# Patient Record
Sex: Female | Born: 1960 | Race: Black or African American | Hispanic: No | Marital: Single | Smoking: Former smoker
Health system: Southern US, Community
[De-identification: ages and names within clinical notes are randomized; demographics above are authoritative.]

## PROBLEM LIST (undated history)

## (undated) DIAGNOSIS — R0600 Dyspnea, unspecified: Secondary | ICD-10-CM

## (undated) DIAGNOSIS — I10 Essential (primary) hypertension: Secondary | ICD-10-CM

## (undated) DIAGNOSIS — Z972 Presence of dental prosthetic device (complete) (partial): Secondary | ICD-10-CM

## (undated) DIAGNOSIS — R7303 Prediabetes: Secondary | ICD-10-CM

## (undated) DIAGNOSIS — M1990 Unspecified osteoarthritis, unspecified site: Secondary | ICD-10-CM

## (undated) DIAGNOSIS — E785 Hyperlipidemia, unspecified: Secondary | ICD-10-CM

## (undated) HISTORY — DX: Hyperlipidemia, unspecified: E78.5

## (undated) HISTORY — DX: Essential (primary) hypertension: I10

## (undated) HISTORY — PX: TUBAL LIGATION: SHX77

---

## 2014-10-11 LAB — HM COLONOSCOPY

## 2018-02-15 ENCOUNTER — Ambulatory Visit (INDEPENDENT_AMBULATORY_CARE_PROVIDER_SITE_OTHER): Payer: Medicaid Other | Admitting: Family Medicine

## 2018-02-15 ENCOUNTER — Other Ambulatory Visit: Payer: Self-pay

## 2018-02-15 ENCOUNTER — Encounter: Payer: Self-pay | Admitting: Family Medicine

## 2018-02-15 VITALS — BP 160/88 | HR 77 | Temp 97.9°F | Wt 205.2 lb

## 2018-02-15 DIAGNOSIS — I10 Essential (primary) hypertension: Secondary | ICD-10-CM | POA: Diagnosis not present

## 2018-02-15 DIAGNOSIS — S83511A Sprain of anterior cruciate ligament of right knee, initial encounter: Secondary | ICD-10-CM | POA: Diagnosis present

## 2018-02-15 DIAGNOSIS — S83411A Sprain of medial collateral ligament of right knee, initial encounter: Secondary | ICD-10-CM | POA: Diagnosis not present

## 2018-02-15 HISTORY — DX: Sprain of anterior cruciate ligament of right knee, initial encounter: S83.511A

## 2018-02-15 HISTORY — DX: Sprain of medial collateral ligament of right knee, initial encounter: S83.411A

## 2018-02-15 MED ORDER — DICLOFENAC SODIUM 50 MG PO TBEC: 50.0000 mg | DELAYED_RELEASE_TABLET | Freq: Three times a day (TID) | ORAL | 0 refills | Status: DC

## 2018-02-15 NOTE — Patient Instructions (Addendum)
It was great seeing you today! I believe that you have an ACL tear based on your MRI that you brought in. I will refer you to orthopedic surgery for further management. I have changed your meloxicam to diclofenac which I believe will provide short term relief to your pain.

## 2018-02-15 NOTE — Progress Notes (Signed)
  HPI:  Patient presents today for a new patient appointment to establish general primary care, also to discuss right knee and back pain.  Knee pain started in September 2018. It has gradually gotten worse. It is to the point to where she now has to walk with a cane. It has not gotten any better with long-term meloxicam use.  ROS: See HPI  Past Medical Hx:  - hypertension - hyperlipidemia - right knee pain - diabetes  Past Surgical Hx:  -none  Family Hx: updated in Epic  Social Hx: lives at home with daughter and granddaughter, [place]  Health Maintenance:  - appears to need a1c, colonoscopy, tdap, flu, pneumovax  PHYSICAL EXAM: BP (!) 160/88   Pulse 77   Temp 97.9 F (36.6 C) (Oral)   Wt 93.1 kg (205 lb 3.2 oz)   SpO2 98%  Gen: well-appearing African American female. Obese, no acute distress. HEENT: TM normal bilaterally, normal appearing nasal mucosa, eomi, perrla. Heart: regular rate and rhythm, palpable peripheral pulses, no m/r/g Lungs: lungs clear to ausculation bilaterally, symmetric chest rise bilaterally Abdomen: soft, non-tender, non-distended. Neuro: CN 2-12 intact, 5/5 strength all muscle groups in all extremities Extremities: right knee tender to palpation, no anterior drawer sign elicited  ASSESSMENT/PLAN:  # Health maintenance:  - appears to need a1c, colonoscopy, tdap, flu, pneumovax  Rupture of anterior cruciate ligament of right knee Patient with 6 month history of increasing knee pain. Had MRI performed at former residence which is consistent with ACL tear, MCL sprain. Oddly doesn't remember any specific instance of trauma. 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 Will switch from meloxicam to dilofenac in the hopes for pain control. Ultimately will need evaluation and likely surgical repair by orthopedic surgery. Referral placed. - referral to orthopedics -switch from  meloxicam to orthopedics - records from outside PCP scanned in  Sprain of medial collateral ligament of right knee Same as above plan with ACL. Likely can rehab and will not need surgical fixture.  Essential hypertension Initially in 180s/90s. On recheck 20 minutes later was 160/80. Will ask to come back in three weeks and will recheck. Likely can increase lisinopril/hctz. Given that she is AA can also consider adding CCB.     FOLLOW UP: Follow up in 3 weeks for hypertension follow up, monitor pain, ensure orthopedics appointment set up.  Myrene BuddyJacob Yana Schorr MD PGY-1 Family Medicine Resident

## 2018-02-15 NOTE — Assessment & Plan Note (Signed)
Same as above plan with ACL. Likely can rehab and will not need surgical fixture.

## 2018-02-15 NOTE — Assessment & Plan Note (Signed)
Initially in 180s/90s. On recheck 20 minutes later was 160/80. Will ask to come back in three weeks and will recheck. Likely can increase lisinopril/hctz. Given that she is AA can also consider adding CCB.

## 2018-02-15 NOTE — Assessment & Plan Note (Signed)
Patient with 6 month history of increasing knee pain. Had MRI performed at former residence which is consistent with ACL tear, MCL sprain. Oddly doesn't remember any specific instance of trauma. 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 Will switch from meloxicam to dilofenac in the hopes for pain control. Ultimately will need evaluation and likely surgical repair by orthopedic surgery. Referral placed. - referral to orthopedics -switch from meloxicam to orthopedics - records from outside PCP scanned in

## 2018-02-22 ENCOUNTER — Ambulatory Visit (INDEPENDENT_AMBULATORY_CARE_PROVIDER_SITE_OTHER): Payer: Medicaid Other | Admitting: Orthopedic Surgery

## 2018-02-22 ENCOUNTER — Encounter (INDEPENDENT_AMBULATORY_CARE_PROVIDER_SITE_OTHER): Payer: Self-pay | Admitting: Orthopedic Surgery

## 2018-02-22 VITALS — Wt 205.0 lb

## 2018-02-22 DIAGNOSIS — M1711 Unilateral primary osteoarthritis, right knee: Secondary | ICD-10-CM | POA: Diagnosis not present

## 2018-02-22 MED ORDER — METHYLPREDNISOLONE ACETATE 40 MG/ML IJ SUSP
40.0000 mg | INTRAMUSCULAR | Status: AC | PRN
  Administered 2018-02-22: 40 mg via INTRA_ARTICULAR

## 2018-02-22 MED ORDER — LIDOCAINE HCL 1 % IJ SOLN
5.0000 mL | INTRAMUSCULAR | Status: AC | PRN
  Administered 2018-02-22: 5 mL

## 2018-02-22 NOTE — Progress Notes (Signed)
Office Visit Note   Patient: Sandra Lozano           Date of Birth: 1961/10/19           MRN: [id number] Visit Date: 02/22/2018              Requested by: Myrene Buddy, MD [address] [place], [place] [address] PCP: Myrene Buddy, MD  Chief Complaint  Patient presents with  . Right Knee - Pain      HPI: Patient is a 57 year old woman who complains of right knee pain.  Denies any mechanical catching locking or giving way.  Patient states that over 5 years ago she fell directly on her knees several times.  Patient denies any rotational or varus or valgus stress injuries to her knee.  Assessment & Plan: Visit Diagnoses:  1. Unilateral primary osteoarthritis, right knee     Plan: Right knee was injected from the anterior medial portal she tolerated this well increase her activities as tolerated reevaluate in 4 weeks.  Follow-Up Instructions: Return in about 4 weeks (around 03/22/2018).   Ortho Exam  Patient is alert, oriented, no adenopathy, well-dressed, normal affect, normal respiratory effort. Has difficulty getting from sitting from a sitting to standing position patient has a slight antalgic gait.  She does have an effusion of the right knee varus and valgus stress is nontender the medial and lateral collateral ligaments are nontender to palpation the medial lateral joint line is nontender to palpation the anterior drawer is stable.  She does have a effusion.  Review of the MRI scan is suggestive of a meniscal tear sprain of the MCL as well as ACL injury.  Patient has no clinical signs or symptoms of these injuries.  Radiographs do show medial joint line narrowing which is consistent with her arthritic symptoms.  Imaging: No results found. No images are attached to the encounter.  Labs: No results found for: HGBA1C, ESRSEDRATE, CRP, LABURIC, REPTSTATUS, GRAMSTAIN, CULT, LABORGA  @LABSALLVALUES (HGBA1)@  There is no height or weight on file to calculate  BMI.  Orders:  No orders of the defined types were placed in this encounter.  No orders of the defined types were placed in this encounter.    Procedures: Large Joint Inj: R knee on 02/22/2018 8:48 AM Indications: pain and diagnostic evaluation Details: 22 G 1.5 in needle, anteromedial approach  Arthrogram: No  Medications: 5 mL lidocaine 1 %; 40 mg methylPREDNISolone acetate 40 MG/ML Outcome: tolerated well, no immediate complications Procedure, treatment alternatives, risks and benefits explained, specific risks discussed. Consent was given by the patient. Immediately prior to procedure a time out was called to verify the correct patient, procedure, equipment, support staff and site/side marked as required. Patient was prepped and draped in the usual sterile fashion.      Clinical Data: No additional findings.  ROS:  All other systems negative, except as noted in the HPI. Review of Systems  Objective: Vital Signs: Wt 205 lb (93 kg)   Specialty Comments:  No specialty comments available.  PMFS History: Patient Active Problem List   Diagnosis Date Noted  . Rupture of anterior cruciate ligament of right knee 02/15/2018  . Sprain of medial collateral ligament of right knee 02/15/2018  . Essential hypertension 02/15/2018   Past Medical History:  Diagnosis Date  . Hyperlipidemia   . Hypertension     History reviewed. No pertinent family history.  History reviewed. No pertinent surgical history. Social History   Occupational History  .  Not on file  Tobacco Use  . Smoking status: Former Smoker    Last attempt to quit: 01/2015    Years since quitting: 3.1  . Smokeless tobacco: Never Used  Substance and Sexual Activity  . Alcohol use: Not on file  . Drug use: Not on file  . Sexual activity: Not on file

## 2018-03-05 ENCOUNTER — Ambulatory Visit: Payer: Medicaid Other | Admitting: Family Medicine

## 2018-03-17 ENCOUNTER — Encounter: Payer: Self-pay | Admitting: Family Medicine

## 2018-03-18 ENCOUNTER — Other Ambulatory Visit: Payer: Self-pay | Admitting: Family Medicine

## 2018-03-18 MED ORDER — DICLOFENAC SODIUM 50 MG PO TBEC: 50.0000 mg | DELAYED_RELEASE_TABLET | Freq: Three times a day (TID) | ORAL | 0 refills | Status: DC

## 2018-03-18 NOTE — Telephone Encounter (Signed)
Pt is calling and needs a refill on her Voltaren [facility] [place] jw

## 2018-03-29 ENCOUNTER — Ambulatory Visit (INDEPENDENT_AMBULATORY_CARE_PROVIDER_SITE_OTHER): Payer: Medicaid Other | Admitting: Orthopedic Surgery

## 2018-03-29 ENCOUNTER — Encounter (INDEPENDENT_AMBULATORY_CARE_PROVIDER_SITE_OTHER): Payer: Self-pay | Admitting: Orthopedic Surgery

## 2018-03-29 VITALS — Wt 205.0 lb

## 2018-03-29 DIAGNOSIS — M1711 Unilateral primary osteoarthritis, right knee: Secondary | ICD-10-CM

## 2018-03-29 MED ORDER — DICLOFENAC SODIUM 75 MG PO TBEC: 75.0000 mg | DELAYED_RELEASE_TABLET | Freq: Two times a day (BID) | ORAL | 3 refills | Status: DC | PRN

## 2018-03-29 NOTE — Progress Notes (Signed)
   Office Visit Note   Patient: Sandra RiggsConnie Cutsforth           Date of Birth: 03/20/1961           MRN: [id number] Visit Date: 03/29/2018              Requested by: Myrene BuddyFletcher, Jacob, MD [phone] [place], [place] [id number] PCP: Myrene BuddyFletcher, Jacob, MD  Chief Complaint  Patient presents with  . Right Knee - Follow-up    S/p cortisone injection 02/22/18      HPI: Patient is a 57 year old woman with osteoarthritis of her right knee she had good interval relief of the injection for the right knee she states she is going to And is having increasing pain at this time.  Assessment & Plan: Visit Diagnoses:  1. Unilateral primary osteoarthritis, right knee     Plan: Right knee was injected discussed that we could proceed with ring injection this year.  She will follow-up as needed.  Discussed the possibility of a total knee arthroplasty several years if she still has persistent symptoms.  Follow-Up Instructions: Return if symptoms worsen or fail to improve.   Ortho Exam  Patient is alert, oriented, no adenopathy, well-dressed, normal affect, normal respiratory effort. Examination patient has an antalgic gait she is crepitation range of motion right knee collaterals and cruciates are stable there is no redness no cellulitis she has a moderate effusion of the knee.  Is tender to palpation of the medial and lateral joint line.  She has no mechanical symptoms.  Imaging: No results found. No images are attached to the encounter.  Labs: No results found for: HGBA1C, ESRSEDRATE, CRP, LABURIC, REPTSTATUS, GRAMSTAIN, CULT, LABORGA  @LABSALLVALUES (HGBA1)@  There is no height or weight on file to calculate BMI.  Orders:  No orders of the defined types were placed in this encounter.  No orders of the defined types were placed in this encounter.    Procedures: No procedures performed  Clinical Data: No additional findings.  ROS:  All other systems negative, except as noted in the  HPI. Review of Systems  Objective: Vital Signs: Wt 205 lb (93 kg)   Specialty Comments:  No specialty comments available.  PMFS History: Patient Active Problem List   Diagnosis Date Noted  . Rupture of anterior cruciate ligament of right knee 02/15/2018  . Sprain of medial collateral ligament of right knee 02/15/2018  . Essential hypertension 02/15/2018   Past Medical History:  Diagnosis Date  . Hyperlipidemia   . Hypertension     History reviewed. No pertinent family history.  History reviewed. No pertinent surgical history. Social History   Occupational History  . Not on file  Tobacco Use  . Smoking status: Former Smoker    Last attempt to quit: 01/2015    Years since quitting: 3.2  . Smokeless tobacco: Never Used  Substance and Sexual Activity  . Alcohol use: Not on file  . Drug use: Not on file  . Sexual activity: Not on file

## 2018-03-30 ENCOUNTER — Telehealth (INDEPENDENT_AMBULATORY_CARE_PROVIDER_SITE_OTHER): Payer: Self-pay

## 2018-03-30 NOTE — Telephone Encounter (Signed)
Called and lm on vm for pharm to advise that the rx for diclofenac was approved through the [facility] portal for 75mg  bid and they should be abel to run rx without problems. To call with questions. Approval # [id number]

## 2018-04-01 ENCOUNTER — Ambulatory Visit: Payer: Medicaid Other | Admitting: Family Medicine

## 2018-04-12 ENCOUNTER — Other Ambulatory Visit: Payer: Self-pay

## 2018-04-12 MED ORDER — METFORMIN HCL 500 MG PO TABS: 500.0000 mg | ORAL_TABLET | Freq: Every day | ORAL | 2 refills | Status: DC

## 2018-04-12 MED ORDER — DICLOFENAC SODIUM 75 MG PO TBEC: 75.0000 mg | DELAYED_RELEASE_TABLET | Freq: Two times a day (BID) | ORAL | 3 refills | Status: AC | PRN

## 2018-04-12 MED ORDER — LISINOPRIL-HYDROCHLOROTHIAZIDE 10-12.5 MG PO TABS: 1.0000 | ORAL_TABLET | Freq: Every day | ORAL | 2 refills | Status: DC

## 2018-04-12 MED ORDER — ATORVASTATIN CALCIUM 20 MG PO TABS: 20.0000 mg | ORAL_TABLET | Freq: Every day | ORAL | 2 refills | Status: DC

## 2018-04-12 NOTE — Telephone Encounter (Signed)
Pt requesting refill of all her medications. Pt call back [phone] Shawna Orleans, RN

## 2018-05-05 ENCOUNTER — Ambulatory Visit: Payer: Medicaid Other | Admitting: Family Medicine

## 2018-05-05 DIAGNOSIS — G8929 Other chronic pain: Secondary | ICD-10-CM | POA: Diagnosis not present

## 2018-05-05 DIAGNOSIS — M25511 Pain in right shoulder: Secondary | ICD-10-CM | POA: Diagnosis not present

## 2018-05-05 DIAGNOSIS — M25551 Pain in right hip: Secondary | ICD-10-CM | POA: Insufficient documentation

## 2018-05-05 DIAGNOSIS — I10 Essential (primary) hypertension: Secondary | ICD-10-CM | POA: Diagnosis not present

## 2018-05-05 DIAGNOSIS — Z0000 Encounter for general adult medical examination without abnormal findings: Secondary | ICD-10-CM | POA: Diagnosis not present

## 2018-05-05 HISTORY — DX: Pain in right hip: M25.551

## 2018-05-05 MED ORDER — LISINOPRIL-HYDROCHLOROTHIAZIDE 20-12.5 MG PO TABS: 1.0000 | ORAL_TABLET | Freq: Every day | ORAL | 3 refills | Status: DC

## 2018-05-05 NOTE — Patient Instructions (Addendum)
It was great seeing you today! I am glad that your right knee has been feeling better since I sent you to Dr. Lajoyce Corners. Regarding your right shoulder, your pain might be consistent with arthritis. I have placed an order for a shoulder xray to help me make the diagnosis. You can go at any time to the hospital to have this done. Regarding your hip pain, this could possibly be due to arthritis as well. I will also put in for an xray at that time. I will be increasing your dosage of your blood pressure medication.  I have sent this to your pharmacy. Please come back in around 2-3 weeks for blood pressure follow up/

## 2018-05-11 ENCOUNTER — Encounter: Payer: Self-pay | Admitting: Family Medicine

## 2018-05-11 NOTE — Assessment & Plan Note (Signed)
Consistent with osteoarthritis. Would like to obtain xray to confirm. Patient would like to defer treatment until diagnosis is made.

## 2018-05-11 NOTE — Assessment & Plan Note (Signed)
Increasing lisinopril/hctz up from 10/12.5 up to 20/12.5. Have asked her to come back in 3 weeks for bp follow up.

## 2018-05-11 NOTE — Assessment & Plan Note (Signed)
Likely OA or perhaps chronic muscle soreness due to altered gait 2/2 knee pain. Would like to get xray to aid with exact diagnosis. Patient in agreement. Would like to defer treatment until know exactly what is being treated.

## 2018-05-11 NOTE — Progress Notes (Signed)
HPI 57 year old who presents with around 3 week history of worsening shoulder pain and right hip pain. Patient states that she first notice the right shoulder pain when she fell asleep on it one night. Since then it has slowly been getting worse. She states that the pain "feels like it is inside" the joint. It does not cause her any significant limitation in her arm movement. She has tries over the counter remedies but nothing has worked for her.  Regarding her hip pain, this has been far more chronic. She states that this started a little after her knee pain started. She does endorse that she did alter her gait a little bit after the knee pain started. It has slowly gotten worse from that point.  Her blood pressure remains elevated in the office. It was 160/100 at presentation. Asymptomatic but she is wishing to increase.  CC: right shoulder pain/right hip pain   ROS:  Review of Systems See HPI for ROS.   CC, SH/smoking status, and VS noted  Objective: There were no vitals taken for this visit. Gen: well-appearing AA female. Obese, no acute distress. Heart:rrr, palpable peripheral pulses, no m/r/g Lungs:lungs CTAB, symmetric chest rise bilaterally Abdomen: soft, NT, ND. Neuro: CN 2-12 intact, 5/5 strength all muscle groups in all extremities Extremities: right shoulder with pain to deep palpation, feels like it is within joint space, right hip without any point tenderness  Assessment and plan:  Essential hypertension Increasing lisinopril/hctz up from 10/12.5 up to 20/12.5. Have asked her to come back in 3 weeks for bp follow up.  Right hip pain Likely OA or perhaps chronic muscle soreness due to altered gait 2/2 knee pain. Would like to get xray to aid with exact diagnosis. Patient in agreement. Would like to defer treatment until know exactly what is being treated.  Chronic right shoulder pain Consistent with osteoarthritis. Would like to obtain xray to confirm. Patient  would like to defer treatment until diagnosis is made.   Orders Placed This Encounter  Procedures  . DG Shoulder Right    Standing Status:   Future    Standing Expiration Date:   07/06/2019    Order Specific Question:   Reason for Exam (SYMPTOM  OR DIAGNOSIS REQUIRED)    Answer:   right shoulder pain    Order Specific Question:   Is patient pregnant?    Answer:   No    Order Specific Question:   Preferred imaging location?    Answer:   [facility]    Order Specific Question:   Radiology Contrast Protocol - do NOT remove file path    Answer:   \\[email or web address]\Radiant\DXFluoroContrastProtocols.pdf  . DG Pelvis 1-2 Views    Please specifically look at the right hip    Standing Status:   Future    Standing Expiration Date:   07/06/2019    Order Specific Question:   Reason for Exam (SYMPTOM  OR DIAGNOSIS REQUIRED)    Answer:   right hip pain    Order Specific Question:   Is patient pregnant?    Answer:   No    Order Specific Question:   Preferred imaging location?    Answer:   [facility]    Order Specific Question:   Radiology Contrast Protocol - do NOT remove file path    Answer:   \\charchive\[email or web address]\Radiant\DXFluoroContrastProtocols.pdf  . MS DIGITAL SCREENING TOMO BILATERAL    Standing Status:   Future    Standing Expiration Date:  07/06/2019    Order Specific Question:   Reason for Exam (SYMPTOM  OR DIAGNOSIS REQUIRED)    Answer:   bilaterally screening mammogram    Order Specific Question:   Is the patient pregnant?    Answer:   No    Order Specific Question:   Preferred imaging location?    Answer:   External    Comments:   [facility]    Meds ordered this encounter  Medications  . lisinopril-hydrochlorothiazide (ZESTORETIC) 20-12.5 MG tablet    Sig: Take 1 tablet by mouth daily.    Dispense:  90 tablet    Refill:  3     Myrene Buddy MD PGY-1 Family Medicine Resident  05/11/2018 4:19 PM

## 2018-05-17 ENCOUNTER — Ambulatory Visit (HOSPITAL_COMMUNITY)
Admission: RE | Admit: 2018-05-17 | Discharge: 2018-05-17 | Disposition: A | Discharge status: Home / Self Care | Payer: Medicaid Other | Source: Ambulatory Visit | Attending: Family Medicine | Admitting: Family Medicine

## 2018-05-17 DIAGNOSIS — M160 Bilateral primary osteoarthritis of hip: Secondary | ICD-10-CM | POA: Insufficient documentation

## 2018-05-17 DIAGNOSIS — M19011 Primary osteoarthritis, right shoulder: Secondary | ICD-10-CM | POA: Insufficient documentation

## 2018-05-17 DIAGNOSIS — M25511 Pain in right shoulder: Secondary | ICD-10-CM

## 2018-05-17 DIAGNOSIS — M25551 Pain in right hip: Secondary | ICD-10-CM | POA: Diagnosis present

## 2018-05-17 DIAGNOSIS — G8929 Other chronic pain: Secondary | ICD-10-CM

## 2018-05-17 DIAGNOSIS — M5136 Other intervertebral disc degeneration, lumbar region: Secondary | ICD-10-CM | POA: Insufficient documentation

## 2018-06-03 ENCOUNTER — Ambulatory Visit (INDEPENDENT_AMBULATORY_CARE_PROVIDER_SITE_OTHER): Payer: Medicaid Other | Admitting: Family Medicine

## 2018-06-03 ENCOUNTER — Encounter: Payer: Self-pay | Admitting: Family Medicine

## 2018-06-03 ENCOUNTER — Other Ambulatory Visit: Payer: Self-pay

## 2018-06-03 VITALS — BP 128/88 | HR 74 | Temp 98.6°F | Ht 64.0 in | Wt 191.0 lb

## 2018-06-03 DIAGNOSIS — M1611 Unilateral primary osteoarthritis, right hip: Secondary | ICD-10-CM

## 2018-06-03 DIAGNOSIS — I10 Essential (primary) hypertension: Secondary | ICD-10-CM | POA: Diagnosis not present

## 2018-06-03 MED ORDER — MELOXICAM 7.5 MG PO TABS: 7.5000 mg | ORAL_TABLET | Freq: Every day | ORAL | 1 refills | Status: DC

## 2018-06-03 NOTE — Patient Instructions (Addendum)
It was great seeing you again today! It looks like all of your pain is due to osteoarthritis. I think switching from voltaren to meloxicam. We will start with a 7.5mg  daily dosing. If this stops being effective for your, or is not providing the relief that you are looking for, please let me know.  You have made really good progress on losing weight. Keep up the excellent work, this will help ease the pain of your arthritis!  I have attached a link with good stretches for osteoarthritis http://bell-hale.biz/https://www.webmd.com/osteoarthritis/knee-pain-16/slideshow-knee-exercises You can google other stretches as well if these are no helpful. I would consider swimming and biking for exercise if you find that walking long distances is hurting your joints. These are more low impact and you often will get similar calorie burning benefits.  Your blood pressure looks great today. No medication adjustments are needed.

## 2018-06-08 ENCOUNTER — Encounter: Payer: Self-pay | Admitting: Family Medicine

## 2018-06-08 NOTE — Assessment & Plan Note (Signed)
On hctz/lisinopril 20/12.5. 128/88 while in office. Continue this dose. No changes needed. - continue hctz/lisinopril 20/12.5 daily

## 2018-06-08 NOTE — Progress Notes (Signed)
   HPI 57 year old female who presents with right hip and shoulder pain. This is a chronic problem that has been present for years. Patient did have hip and shoulder xrays which revealed degenerative OA changes in right glenohumeral and AC join. Also found OA changes in hips bilaterally, right > Left.   Patient also had elevated blood pressure up to 170s in office at last visit. Her lisinopril.hctz was increased from 10/12.5 to 20/12.5. Her blood pressure Is 128/88 at presentation.  CC: right hip and shoulder pain   ROS:   Review of Systems See HPI for ROS.   CC, SH/smoking status, and VS noted  Objective: BP 128/88   Pulse 74   Temp 98.6 F (37 C) (Oral)   Ht 5\' 4"  (1.626 m)   Wt 191 lb (86.6 kg)   SpO2 98%   BMI 32.79 kg/m  Gen: NAD, alert, cooperative, and pleasant. HEENT: NCAT, EOMI, PERRL CV: RRR, no murmur Resp: CTAB, no wheezes, non-labored Abd: SNTND, BS present, no guarding or organomegaly Ext: No edema, warm Neuro: Alert and oriented, Speech clear, No gross deficits Gait: no significant gait limitations Right Hip and shoulder:  Tenderness to palpation in inner hip and right shoulder, no focal deficits   Assessment and plan:  Osteoarthritis of right hip Will switch from voltaren to mobic 7.5mg . Patient states this has helped her in the past. Explained that is is chronic problem and is best treated with lifestyle modifications. Of note patient has lost weight and is exercising. - d/c voltaren - mobic 7.5mg  daily - continue great work with lifestyle modifications  Essential hypertension On hctz/lisinopril 20/12.5. 128/88 while in office. Continue this dose. No changes needed. - continue hctz/lisinopril 20/12.5 daily   No orders of the defined types were placed in this encounter.   Meds ordered this encounter  Medications  . meloxicam (MOBIC) 7.5 MG tablet    Sig: Take 1 tablet (7.5 mg total) by mouth daily.    Dispense:  30 tablet    Refill:  1      Myrene BuddyJacob Maanvi Lecompte MD PGY-1 Family Medicine Resident  06/08/2018 7:13 PM

## 2018-06-08 NOTE — Assessment & Plan Note (Signed)
Will switch from voltaren to mobic 7.5mg . Patient states this has helped her in the past. Explained that is is chronic problem and is best treated with lifestyle modifications. Of note patient has lost weight and is exercising. - d/c voltaren - mobic 7.5mg  daily - continue great work with lifestyle modifications

## 2018-07-15 ENCOUNTER — Ambulatory Visit: Payer: Medicaid Other | Admitting: Family Medicine

## 2018-07-15 VITALS — BP 130/80 | HR 82 | Temp 98.0°F | Ht 64.0 in | Wt 189.2 lb

## 2018-07-15 DIAGNOSIS — M545 Low back pain, unspecified: Secondary | ICD-10-CM

## 2018-07-15 MED ORDER — METHOCARBAMOL 750 MG PO TABS: 750.0000 mg | ORAL_TABLET | Freq: Three times a day (TID) | ORAL | 0 refills | Status: DC

## 2018-07-15 MED ORDER — DICLOFENAC SODIUM 1 % TD GEL: 4.0000 g | Freq: Four times a day (QID) | TRANSDERMAL | 1 refills | Status: DC

## 2018-07-15 MED ORDER — MELOXICAM 7.5 MG PO TABS: 7.5000 mg | ORAL_TABLET | Freq: Every day | ORAL | 1 refills | Status: DC

## 2018-07-15 NOTE — Patient Instructions (Signed)
It was great seeing you again! I am sorry that you have developed the back pain after sleeping while sitting upright. I think this is likely due to a muscle strain or muscle spasms causing these symptoms. I think a weeklong course of a muscle relaxer, and an antiinflammatory gel known as voltaren gel will help. You can take the mobic once or twice per day as needed for your arthritis pain as well as the back pain.

## 2018-07-18 ENCOUNTER — Encounter: Payer: Self-pay | Admitting: Family Medicine

## 2018-07-18 DIAGNOSIS — M545 Low back pain, unspecified: Secondary | ICD-10-CM | POA: Insufficient documentation

## 2018-07-18 NOTE — Progress Notes (Signed)
   HPI 57 year old who female who presents with back pain. 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 She fell asleep in a chair and afterwards developed left sided back pain. She feels the mobic has helped her some but has not gotten much relief. She has no numbness or tingling in any extremities. The pain is reproducible on palpation.  CC: back pain   ROS:   Review of Systems See HPI for ROS.   CC, SH/smoking status, and VS noted  Objective: BP 130/80   Pulse 82   Temp 98 F (36.7 C)   Ht 5\' 4"  (1.626 m)   Wt 189 lb 3.2 oz (85.8 kg)   SpO2 97%   BMI 32.48 kg/m  Gen: NAD, alert, cooperative, and pleasant. Well appearing AA female HEENT: NCAT, EOMI, PERRL CV: RRR, no murmur Resp: CTAB, no wheezes, non-labored Abd: SNTND, BS present, no guarding or organomegaly Ext: No edema, warm Neuro: Alert and oriented, Speech clear, No gross deficits MSK: left lower back, palpable tenderness in area of lower latissimus dorsi   Assessment and plan:  Acute left-sided low back pain without sciatica Likely MSK in etiology given the reproducible nature and sleeping on a hard chair. Some relief from her NSAID. Will give week course of robaxin and refill voltaren gel. Patient to come back if back pain does not improve with rest.   No orders of the defined types were placed in this encounter.   Meds ordered this encounter  Medications  . diclofenac sodium (VOLTAREN) 1 % GEL    Sig: Apply 4 g topically 4 (four) times daily.    Dispense:  100 g    Refill:  1  . methocarbamol (ROBAXIN-750) 750 MG tablet    Sig: Take 1 tablet (750 mg total) by mouth 3 (three) times daily.    Dispense:  31 tablet    Refill:  0  . meloxicam (MOBIC) 7.5 MG tablet    Sig: Take 1 tablet (7.5 mg total) by mouth daily.    Dispense:  30 tablet    Refill:  1     Myrene BuddyJacob Concha Sudol MD PGY-2 Family Medicine Resident  07/18/2018 3:03 PM

## 2018-07-18 NOTE — Assessment & Plan Note (Signed)
Likely MSK in etiology given the reproducible nature and sleeping on a hard chair. Some relief from her NSAID. Will give week course of robaxin and refill voltaren gel. Patient to come back if back pain does not improve with rest.

## 2018-07-21 ENCOUNTER — Ambulatory Visit: Payer: Medicaid Other | Admitting: Podiatry

## 2018-08-04 ENCOUNTER — Ambulatory Visit: Payer: Medicaid Other | Admitting: Podiatry

## 2018-08-04 DIAGNOSIS — L600 Ingrowing nail: Secondary | ICD-10-CM

## 2018-08-04 NOTE — Patient Instructions (Signed)

## 2018-08-08 NOTE — Progress Notes (Signed)
   Subjective: Patient presents today for evaluation of pain to the medial border of the right hallux that began for a few weeks. Patient is concerned for possible ingrown nail. She has tried to cut the nail out herself with no success. Touching the toe and wearing certain shoes increases the pain. Patient presents today for further treatment and evaluation.  Past Medical History:  Diagnosis Date  . Hyperlipidemia   . Hypertension     Objective:  General: Well developed, nourished, in no acute distress, alert and oriented x3   Dermatology: Skin is warm, dry and supple bilateral. Medial border of the right great toe appears to be erythematous with evidence of an ingrowing nail. Pain on palpation noted to the border of the nail fold. The remaining nails appear unremarkable at this time. There are no open sores, lesions.  Vascular: Dorsalis Pedis artery and Posterior Tibial artery pedal pulses palpable. No lower extremity edema noted.   Neruologic: Grossly intact via light touch bilateral.  Musculoskeletal: Muscular strength within normal limits in all groups bilateral. Normal range of motion noted to all pedal and ankle joints.   Assesement: #1 Paronychia with ingrowing nail medial border right hallux  #2 Pain in toe #3 Incurvated nail  Plan of Care:  1. Patient evaluated.  2. Discussed treatment alternatives and plan of care. Explained nail avulsion procedure and post procedure course to patient. 3. Patient opted for permanent partial nail avulsion.  4. Prior to procedure, local anesthesia infiltration utilized using 3 ml of a 50:50 mixture of 2% plain lidocaine and 0.5% plain marcaine in a normal hallux block fashion and a betadine prep performed.  5. Partial permanent nail avulsion with chemical matrixectomy performed using 3x30sec applications of phenol followed by alcohol flush.  6. Light dressing applied. 7. Return to clinic in 2 weeks.   Felecia ShellingBrent M. Sherif Millspaugh, DPM [facility]  Dr. Felecia ShellingBrent M. Adib Wahba, DPM    [address]                                        [place], [place] [address]                Office [phone]  Fax [phone]

## 2018-08-23 ENCOUNTER — Encounter: Payer: Self-pay | Admitting: Podiatry

## 2018-08-23 ENCOUNTER — Ambulatory Visit: Payer: Medicaid Other | Admitting: Podiatry

## 2018-08-23 DIAGNOSIS — L600 Ingrowing nail: Secondary | ICD-10-CM

## 2018-08-24 NOTE — Progress Notes (Signed)
   Subjective: Patient presents today 2 weeks post ingrown nail permanent nail avulsion procedure of the medial border of the right hallux. Patient states that the toe and nail fold is feeling much better. Patient is here for further evaluation and treatment.   Past Medical History:  Diagnosis Date  . Hyperlipidemia   . Hypertension     Objective: Skin is warm, dry and supple. Nail and respective nail fold appears to be healing appropriately. Open wound to the associated nail fold with a granular wound base and moderate amount of fibrotic tissue. Minimal drainage noted. Mild erythema around the periungual region likely due to phenol chemical matricectomy.  Assessment: #1 postop permanent partial nail avulsion medial border of the right hallux  #2 open wound periungual nail fold of respective digit.   Plan of care: #1 patient was evaluated  #2 debridement of open wound was performed to the periungual border of the respective toe using a currette. Antibiotic ointment and Band-Aid was applied. #3 patient is to return to clinic on a PRN basis.   Felecia Shelling, DPM [facility]  Dr. Felecia Shelling, DPM    [address]                                        [place], [place] [address]                Office [phone]  Fax [phone]

## 2018-09-06 ENCOUNTER — Encounter

## 2018-09-06 ENCOUNTER — Ambulatory Visit: Payer: Medicaid Other | Admitting: Family Medicine

## 2018-09-06 VITALS — BP 155/80 | HR 70 | Temp 98.1°F | Wt 191.8 lb

## 2018-09-06 DIAGNOSIS — M7632 Iliotibial band syndrome, left leg: Secondary | ICD-10-CM | POA: Diagnosis not present

## 2018-09-06 DIAGNOSIS — Z23 Encounter for immunization: Secondary | ICD-10-CM

## 2018-09-06 MED ORDER — MELOXICAM 7.5 MG PO TABS: 7.5000 mg | ORAL_TABLET | Freq: Every day | ORAL | 0 refills | Status: DC

## 2018-09-06 MED ORDER — METHOCARBAMOL 750 MG PO TABS: 750.0000 mg | ORAL_TABLET | Freq: Every day | ORAL | 0 refills | Status: DC

## 2018-09-06 NOTE — Patient Instructions (Signed)
Thank you for coming in to see us today. Please see below to review our plan for today's visit.  I do believe her pain is related to your IT band which is tissue that connects your need to your hip.  This is often irritated from repetitive use.  I have given you a refill of your Robaxin which you can take 1 time nightly.  This does make you tired so do not operate heavy machinery or drive on this medication.  I have also given you some information on stretches which will improve your symptoms and time.  If your symptoms persist after 1 month of exercise, please let us know and we can get you into see sports medicine.  Please call the clinic at [phone] if your symptoms worsen or you have any concerns. It was our pleasure to serve you.  Durward Parcelavid Thaddaeus Granja, DO [facility], PGY-3    Iliotibial Band Syndrome Iliotibial band syndrome (ITBS) is a condition that often causes knee pain. It can also cause pain in the outside of your hip, thigh, and knee. The iliotibial band is a strip of tissue that runs from the outside of your hip and down your thigh to the outside of your knee. Repeatedly bending and straightening your knee can irritate the iliotibial band. What are the causes? This condition is caused by inflammation and irritation from the friction of the iliotibial band moving over the thigh bone (femur) when you repeatedly bend and straighten your knee. What increases the risk? This condition is more likely to develop in people who:  Frequently change elevation during their workouts.  Run very long distances.  Recently increased the length or intensity of their workouts.  Run downhill often, or just started running downhill.  Ride a bike very far or often.  You may also be at greater risk if you start a new workout routine without first warming up or if you have a job that requires you to bend, squat, or climb frequently. What are the signs or symptoms? Symptoms of this  condition include:  Pain along the outside of your knee that may be worse with activity, especially running or going up and down stairs.  A "snapping" sensation over your knee.  Swelling on the outside of your knee.  Pain or a feeling of tightness in your hip.  How is this diagnosed? This condition is diagnosed based on your symptoms, medical history, and physical exam. You may also see a health care provider who specializes in reducing pain and increasing mobility (physical therapist). A physical therapist may do an exam to check your balance, movement, and way of walking or running (gait) to see whether the way you move could contribute to your injury. You may also have tests to measure your strength, flexibility, and range of motion. How is this treated? Treatment for this condition includes:  Resting and limiting exercise.  Returning to activities gradually.  Doing range-of-motion and strengthening exercises (physical therapy) as told by your health care provider.  Including low-impact activities, such as swimming, in your exercise routine.  Follow these instructions at home:  If directed, apply ice to the injured area. ? Put ice in a plastic bag. ? Place a towel between your skin and the bag. ? Leave the ice on for 20 minutes, 2-3 times per day.  Return to your normal activities as told by your health care provider. Ask your health care provider what activities are safe for you.  Keep all  follow-up visits with your health care provider. This is important. Contact a health care provider if:  Your pain does not improve or gets worse despite treatment. This information is not intended to replace advice given to you by your health care provider. Make sure you discuss any questions you have with your health care provider. Document Released: 05/23/2002 Document Revised: 01/02/2017 Document Reviewed: 01/02/2017 Elsevier Interactive Patient Education  [employer].

## 2018-09-06 NOTE — Progress Notes (Signed)
   Subjective   Patient ID: Sandra Lozano    DOB: 05/23/1961, 57 y.o. female   MRN: [id number]  CC: "Left leg is strained"  HPI: Sandra RiggsConnie Steffenhagen is a 57 y.o. female who presents to clinic today for the following:  EXTREMITY PAIN  Location: left lateral knee radiating to left hip Pain started: 1 month ago, gradual Pain is: strain Severity: 10/10, worse with prolonged standing and improved with walking Medications tried: Mobic for right knee pain with no improvement, Robxin helps Recent trauma: none Similar pain previously: had similar pain on right lateral leg about 1 year ago, spontaneously resolved with OTC "restless legs"  Symptoms Redness: none Swelling: none Fever: none Weakness: none Weight loss: none Rash: none  Patient walks at 5:30 every morning. Has about 15 lb weight loss since march.  ROS: see HPI for pertinent.  PMFSH: HTN, OA.  Surgical history unremarkable.  Family history unremarkable.  Smoking status reviewed. Medications reviewed.  Objective   BP (!) 155/80 (BP Location: Right Arm, Patient Position: Sitting, Cuff Size: Normal)   Pulse 70   Temp 98.1 F (36.7 C) (Oral)   Wt 191 lb 12.8 oz (87 kg)   SpO2 99%   BMI 32.92 kg/m  Vitals and nursing note reviewed.  General: well nourished, well developed, NAD with non-toxic appearance HEENT: normocephalic, atraumatic, moist mucous membranes Cardiovascular: regular rate and rhythm without murmurs, rubs, or gallops Lungs: clear to auscultation bilaterally with normal work of breathing Skin: warm, dry, no rashes or lesions, cap refill < 2 seconds Extremities: warm and well perfused, normal tone, no edema, 5/5 motor strength on lower extremities bilaterally, negative Ober and Patrick test, moderate tenderness distal lateral left femur, neurovascular intact  Assessment & Plan   It band syndrome, left Acute.  Clinical presentation consistent with IT band syndrome.  No signs of trochanteric pain syndrome.  Patient  is without radicular symptoms or weakness. - Given handout with instructions to perform exercises and use Robaxin 700 mg nightly as needed and avoid driving or operating heavy machinery while on medication - Refilled Mobic 7.5 mg daily - Would consider sports medicine referral if pain persist beyond 1 month  Orders Placed This Encounter  Procedures  . Flu Vaccine QUAD 36+ mos IM   Meds ordered this encounter  Medications  . methocarbamol (ROBAXIN-750) 750 MG tablet    Sig: Take 1 tablet (750 mg total) by mouth at bedtime.    Dispense:  15 tablet    Refill:  0  . meloxicam (MOBIC) 7.5 MG tablet    Sig: Take 1 tablet (7.5 mg total) by mouth daily.    Dispense:  30 tablet    Refill:  0    Durward Parcelavid Pelagia Iacobucci, DO [facility], PGY-3 09/07/2018, 9:58 AM

## 2018-09-07 ENCOUNTER — Encounter: Payer: Self-pay | Admitting: Family Medicine

## 2018-09-07 NOTE — Assessment & Plan Note (Addendum)
Acute.  Clinical presentation consistent with IT band syndrome.  No signs of trochanteric pain syndrome.  Patient is without radicular symptoms or weakness. - Given handout with instructions to perform exercises and use Robaxin 700 mg nightly as needed and avoid driving or operating heavy machinery while on medication - Refilled Mobic 7.5 mg daily - Would consider sports medicine referral if pain persist beyond 1 month

## 2018-09-28 ENCOUNTER — Encounter (INDEPENDENT_AMBULATORY_CARE_PROVIDER_SITE_OTHER): Payer: Self-pay | Admitting: Orthopedic Surgery

## 2018-09-28 ENCOUNTER — Ambulatory Visit (INDEPENDENT_AMBULATORY_CARE_PROVIDER_SITE_OTHER): Payer: Medicaid Other | Admitting: Orthopedic Surgery

## 2018-09-28 ENCOUNTER — Ambulatory Visit (INDEPENDENT_AMBULATORY_CARE_PROVIDER_SITE_OTHER): Payer: Medicaid Other

## 2018-09-28 VITALS — Ht 64.0 in | Wt 191.8 lb

## 2018-09-28 DIAGNOSIS — M5410 Radiculopathy, site unspecified: Secondary | ICD-10-CM

## 2018-09-28 DIAGNOSIS — M5136 Other intervertebral disc degeneration, lumbar region: Secondary | ICD-10-CM | POA: Diagnosis not present

## 2018-09-28 DIAGNOSIS — M25562 Pain in left knee: Secondary | ICD-10-CM

## 2018-09-28 MED ORDER — PREDNISONE 10 MG PO TABS: 20.0000 mg | ORAL_TABLET | Freq: Every day | ORAL | 0 refills | Status: DC

## 2018-09-28 NOTE — Progress Notes (Signed)
Office Visit Note   Patient: Sandra Lozano           Date of Birth: 03-13-1961           MRN: [id number] Visit Date: 09/28/2018              Requested by: Myrene Buddy, MD [address] [place], [place] [address] PCP: Myrene Buddy, MD  Chief Complaint  Patient presents with  . Left Knee - Pain      HPI: Patient is a 57 year old woman who complains of pain around the knee superiorly to the patella.  She states it is worse with standing worse with start up.  Patient states occasionally has pain that radiates from the knee up to the lateral aspect of the left thigh and left buttocks region.  Patient states that she uses a cane to walk to the bathroom she states she sits all day at work and this does not hurt other than getting up she has severe pain that radiates up to her thighs she states this is a 10 out of 10.  Assessment & Plan: Visit Diagnoses:  1. Acute pain of left knee   2. Degenerative disc disease, lumbar   3. Radicular pain of left lower extremity     Plan: Patient is given a prednisone Dosepak for the radicular symptoms left lower extremity secondary to the degenerative disc disease at L4-5.  Discussed in follow-up if she is not showing improvement we would set up an MRI scan for possible epidural steroid injections.  Follow-Up Instructions: Return in about 4 weeks (around 10/26/2018).   Ortho Exam  Patient is alert, oriented, no adenopathy, well-dressed, normal affect, normal respiratory effort. Examination patient has no pain with range of motion of her hip knee or ankle.  There is no crepitation with range of motion of the medial lateral joint line are nontender to palpation the patellofemoral joint line is nontender to palpation.  Patient has a negative straight leg raise with no focal motor weakness in either lower extremity.  Imaging: Xr Knee 1-2 Views Left  Result Date: 09/28/2018 2 view radiographs of the left knee shows a congruent joint space with  no significant osteophytic bone spurs.  Xr Lumbar Spine 2-3 Views  Result Date: 09/28/2018 2 view radiographs of the lumbar spine shows complete collapse with a spondylolisthesis grade 1 at L4-5.  No images are attached to the encounter.  Labs: No results found for: HGBA1C, ESRSEDRATE, CRP, LABURIC, REPTSTATUS, GRAMSTAIN, CULT, LABORGA   No results found for: ALBUMIN, PREALBUMIN, LABURIC  Body mass index is 32.92 kg/m.  Orders:  Orders Placed This Encounter  Procedures  . XR Knee 1-2 Views Left  . XR Lumbar Spine 2-3 Views   Meds ordered this encounter  Medications  . predniSONE (DELTASONE) 10 MG tablet    Sig: Take 2 tablets (20 mg total) by mouth daily with breakfast.    Dispense:  60 tablet    Refill:  0     Procedures: No procedures performed  Clinical Data: No additional findings.  ROS:  All other systems negative, except as noted in the HPI. Review of Systems  Objective: Vital Signs: Ht 5\' 4"  (1.626 m)   Wt 191 lb 12.8 oz (87 kg)   BMI 32.92 kg/m   Specialty Comments:  No specialty comments available.  PMFS History: Patient Active Problem List   Diagnosis Date Noted  . It band syndrome, left 09/06/2018  . Acute left-sided low back pain without  sciatica 07/18/2018  . Osteoarthritis of right hip 06/03/2018  . Chronic right shoulder pain 05/05/2018  . Right hip pain 05/05/2018  . Healthcare maintenance 05/05/2018  . Rupture of anterior cruciate ligament of right knee 02/15/2018  . Sprain of medial collateral ligament of right knee 02/15/2018  . Essential hypertension 02/15/2018   Past Medical History:  Diagnosis Date  . Hyperlipidemia   . Hypertension     No family history on file.  No past surgical history on file. Social History   Occupational History  . Not on file  Tobacco Use  . Smoking status: Former Smoker    Last attempt to quit: 01/2015    Years since quitting: 3.7  . Smokeless tobacco: Never Used  Substance and Sexual  Activity  . Alcohol use: Not on file  . Drug use: Not on file  . Sexual activity: Not on file

## 2018-10-26 ENCOUNTER — Encounter (INDEPENDENT_AMBULATORY_CARE_PROVIDER_SITE_OTHER): Payer: Self-pay | Admitting: Physician Assistant

## 2018-10-26 ENCOUNTER — Ambulatory Visit (INDEPENDENT_AMBULATORY_CARE_PROVIDER_SITE_OTHER): Payer: Medicaid Other | Admitting: Physician Assistant

## 2018-10-26 VITALS — Ht 64.0 in | Wt 191.0 lb

## 2018-10-26 DIAGNOSIS — M1711 Unilateral primary osteoarthritis, right knee: Secondary | ICD-10-CM | POA: Diagnosis not present

## 2018-10-26 DIAGNOSIS — M5410 Radiculopathy, site unspecified: Secondary | ICD-10-CM

## 2018-10-26 MED ORDER — METFORMIN HCL 500 MG PO TABS: 500.0000 mg | ORAL_TABLET | Freq: Every day | ORAL | 2 refills | Status: DC

## 2018-10-26 MED ORDER — PREDNISONE 10 MG PO TABS: 20.0000 mg | ORAL_TABLET | Freq: Every day | ORAL | 0 refills | Status: DC

## 2018-10-27 ENCOUNTER — Encounter (INDEPENDENT_AMBULATORY_CARE_PROVIDER_SITE_OTHER): Payer: Self-pay | Admitting: Physician Assistant

## 2018-10-27 NOTE — Progress Notes (Signed)
Office Visit Note   Patient: Sandra Lozano           Date of Birth: 02/20/1961           MRN: [id number] Visit Date: 11/12Nile Lozano              Requested by: Sandra BuddyFletcher, Jacob, MD [address] [place], [place] [address] PCP: Sandra BuddyFletcher, Jacob, MD  Chief Complaint  Patient presents with  . Lower Back - Pain  . Left Knee - Pain      HPI: The patient is a 57 yo female who is seen for follow up for pain about her left buttock, thigh and knee. She has been on a course of prednisone 20 mg daily and reports it was helping but once she decreased to the 10 mg daily her pain returned. She has resumed the 20 mg daily of prednisone, but reports the pain seems worse. She reports having to lean on furniture to walk around. She reports some balance issues as well, but has normal strength and has not noticed any numbness or tingling.    Assessment & Plan: Visit Diagnoses:  1. Radicular pain of left lower extremity   2. Unilateral primary osteoarthritis, right knee     Plan: Counseled she can continue on the Prednisone 20 mg daily for now and will plan to obtain MRI of LS spine to evaluate further. She may need referral to Dr Sandra Lozano for LESI pending results of her MRI.  She also requested a refill on her Metformin today and this was refilled.   Follow-Up Instructions: Return in about 2 weeks (around 11/09/2018).   Ortho Exam  Patient is alert, oriented, no adenopathy, well-dressed, normal affect, normal respiratory effort. The patient uses her hands to " walk up her thighs" to help her stand. She ambulates with slight scissoring of her gait. Strength in bilateral lower extremities is good and equal . SLR is negative bilaterally. Knee and hip range of motion are within functional limits and fairly symmetric.   Imaging: No results found. No images are attached to the encounter.  Labs: No results found for: HGBA1C, ESRSEDRATE, CRP, LABURIC, REPTSTATUS, GRAMSTAIN, CULT, LABORGA   No results  found for: ALBUMIN, PREALBUMIN, LABURIC  Body mass index is 32.79 kg/m.  Orders:  Orders Placed This Encounter  Procedures  . MR Lumbar Spine w/o contrast   Meds ordered this encounter  Medications  . predniSONE (DELTASONE) 10 MG tablet    Sig: Take 2 tablets (20 mg total) by mouth daily with breakfast.    Dispense:  60 tablet    Refill:  0  . metFORMIN (GLUCOPHAGE) 500 MG tablet    Sig: Take 1 tablet (500 mg total) by mouth daily with breakfast.    Dispense:  60 tablet    Refill:  2     Procedures: No procedures performed  Clinical Data: No additional findings.  ROS:  All other systems negative, except as noted in the HPI. Review of Systems  Objective: Vital Signs: Ht 5\' 4"  (1.626 m)   Wt 191 lb (86.6 kg)   BMI 32.79 kg/m   Specialty Comments:  No specialty comments available.  PMFS History: Patient Active Problem List   Diagnosis Date Noted  . It band syndrome, left 09/06/2018  . Acute left-sided low back pain without sciatica 07/18/2018  . Osteoarthritis of right hip 06/03/2018  . Chronic right shoulder pain 05/05/2018  . Right hip pain 05/05/2018  . Healthcare maintenance 05/05/2018  . Rupture  of anterior cruciate ligament of right knee 02/15/2018  . Sprain of medial collateral ligament of right knee 02/15/2018  . Essential hypertension 02/15/2018   Past Medical History:  Diagnosis Date  . Hyperlipidemia   . Hypertension     History reviewed. No pertinent family history.  History reviewed. No pertinent surgical history. Social History   Occupational History  . Not on file  Tobacco Use  . Smoking status: Former Smoker    Last attempt to quit: 01/2015    Years since quitting: 3.7  . Smokeless tobacco: Never Used  Substance and Sexual Activity  . Alcohol use: Not on file  . Drug use: Not on file  . Sexual activity: Not on file

## 2018-11-13 ENCOUNTER — Ambulatory Visit
Admission: RE | Admit: 2018-11-13 | Discharge: 2018-11-13 | Disposition: A | Discharge status: Home / Self Care | Payer: Medicaid Other | Source: Ambulatory Visit | Attending: Physician Assistant | Admitting: Physician Assistant

## 2018-11-13 DIAGNOSIS — M5410 Radiculopathy, site unspecified: Secondary | ICD-10-CM

## 2018-11-23 ENCOUNTER — Telehealth (INDEPENDENT_AMBULATORY_CARE_PROVIDER_SITE_OTHER): Payer: Self-pay

## 2018-11-23 ENCOUNTER — Other Ambulatory Visit (INDEPENDENT_AMBULATORY_CARE_PROVIDER_SITE_OTHER): Payer: Self-pay

## 2018-11-23 DIAGNOSIS — M5136 Other intervertebral disc degeneration, lumbar region: Secondary | ICD-10-CM

## 2018-11-23 DIAGNOSIS — M5410 Radiculopathy, site unspecified: Secondary | ICD-10-CM

## 2018-11-23 NOTE — Progress Notes (Signed)
p 

## 2018-11-23 NOTE — Telephone Encounter (Signed)
Called pt to advise of MRI results and the order for Dr. Alvester MorinNewton to discuss and possible ESI. Pt would like to proceed with this plan and advised that someone would call her in the next several days to get her sch.

## 2018-11-23 NOTE — Telephone Encounter (Signed)
-----   Message from Nadara MustardMarcus Duda V, MD sent at 11/23/2018 12:31 PM EST ----- MRI scan shows severe spinal stenosis.  Have her follow-up with Dr. Alvester MorinNewton to see if she is a candidate for an injection.

## 2018-11-30 ENCOUNTER — Ambulatory Visit (INDEPENDENT_AMBULATORY_CARE_PROVIDER_SITE_OTHER): Payer: Medicaid Other | Admitting: Orthopedic Surgery

## 2018-12-14 ENCOUNTER — Ambulatory Visit (INDEPENDENT_AMBULATORY_CARE_PROVIDER_SITE_OTHER): Payer: Medicaid Other | Admitting: Physical Medicine and Rehabilitation

## 2018-12-14 ENCOUNTER — Encounter (INDEPENDENT_AMBULATORY_CARE_PROVIDER_SITE_OTHER): Payer: Self-pay | Admitting: Physical Medicine and Rehabilitation

## 2018-12-14 VITALS — BP 174/86 | HR 65

## 2018-12-14 DIAGNOSIS — M4316 Spondylolisthesis, lumbar region: Secondary | ICD-10-CM | POA: Diagnosis not present

## 2018-12-14 DIAGNOSIS — M5416 Radiculopathy, lumbar region: Secondary | ICD-10-CM

## 2018-12-14 DIAGNOSIS — G8929 Other chronic pain: Secondary | ICD-10-CM

## 2018-12-14 DIAGNOSIS — M5442 Lumbago with sciatica, left side: Secondary | ICD-10-CM

## 2018-12-14 DIAGNOSIS — M48062 Spinal stenosis, lumbar region with neurogenic claudication: Secondary | ICD-10-CM | POA: Diagnosis not present

## 2018-12-14 DIAGNOSIS — M7632 Iliotibial band syndrome, left leg: Secondary | ICD-10-CM

## 2018-12-14 MED ORDER — ACETAMINOPHEN-CODEINE #3 300-30 MG PO TABS: 1.0000 | ORAL_TABLET | Freq: Four times a day (QID) | ORAL | 0 refills | Status: DC | PRN

## 2018-12-14 MED ORDER — MELOXICAM 7.5 MG PO TABS: 7.5000 mg | ORAL_TABLET | Freq: Two times a day (BID) | ORAL | 0 refills | Status: DC

## 2018-12-14 NOTE — Progress Notes (Signed)
  Numeric Pain Rating Scale and Functional Assessment Average Pain 10 Pain Right Now 8 My pain is intermittent and aching Pain is worse with: standing Pain improves with: rest   In the last MONTH (on 0-10 scale) has pain interfered with the following?  1. General activity like being  able to carry out your everyday physical activities such as walking, climbing stairs, carrying groceries, or moving a chair?  Rating(9)  2. Relation with others like being able to carry out your usual social activities and roles such as  activities at home, at work and in your community. Rating(9)  3. Enjoyment of life such that you have  been bothered by emotional problems such as feeling anxious, depressed or irritable?  Rating(8)

## 2018-12-16 ENCOUNTER — Ambulatory Visit (INDEPENDENT_AMBULATORY_CARE_PROVIDER_SITE_OTHER): Payer: Self-pay | Admitting: Physical Medicine and Rehabilitation

## 2018-12-16 ENCOUNTER — Encounter (INDEPENDENT_AMBULATORY_CARE_PROVIDER_SITE_OTHER): Payer: Self-pay | Admitting: Physical Medicine and Rehabilitation

## 2018-12-16 ENCOUNTER — Ambulatory Visit (INDEPENDENT_AMBULATORY_CARE_PROVIDER_SITE_OTHER): Payer: Self-pay

## 2018-12-16 VITALS — BP 161/86 | HR 66 | Temp 98.1°F

## 2018-12-16 DIAGNOSIS — M5416 Radiculopathy, lumbar region: Secondary | ICD-10-CM

## 2018-12-16 MED ORDER — METHYLPREDNISOLONE ACETATE 80 MG/ML IJ SUSP
80.0000 mg | Freq: Once | INTRAMUSCULAR | Status: AC
  Administered 2018-12-16: 80 mg

## 2018-12-16 NOTE — Patient Instructions (Signed)

## 2018-12-16 NOTE — Progress Notes (Signed)
.  Numeric Pain Rating Scale and Functional Assessment Average Pain 10   In the last MONTH (on 0-10 scale) has pain interfered with the following?  1. General activity like being  able to carry out your everyday physical activities such as walking, climbing stairs, carrying groceries, or moving a chair?  Rating(9)   +Driver, -BT, -Dye Allergies.  

## 2018-12-16 NOTE — Progress Notes (Signed)
Sandra Lozano - 58 y.o. female MRN [id number]  Date of birth: [birth year]  Office Visit Note: Visit Date: 12/14/2018 PCP: Myrene Buddy, MD Referred by: Myrene Buddy, MD  Subjective: Chief Complaint  Patient presents with  . Lower Back - Pain  . Left Hip - Pain  . Left Leg - Pain   HPI: Sandra Lozano is a 58 y.o. female who comes in today At the request of Dr. Aldean Baker for evaluation management of her low back and left thigh pain which is been chronic and worsening and severe over the last several months.  She initially saw Dr. Lajoyce Corners for knee pain with MRI showing some internal derangement and arthritis.  She did well with cortisone injection and treatment.  She reports to me that in August of this year she had acute onset of low back and left hip and thigh pain which is mostly posterior lateral somewhat anterior lateral thigh pain.  This is worse with standing and ambulating and is just gotten worse ever since that time.  She does not report any specific injury.  She has not noted any focal weakness or bowel or bladder changes.  She was seen by Dr. Lajoyce Corners at that point as well as her family physician.  They treated her with anti-inflammatories and prednisone and activity modification.  She did get some relief at first but the symptoms continued and MRI of the lumbar spine was ultimately performed.  Patient has no history of prior back surgery.  MRI was reviewed with the patient with spine models in the imaging and is reviewed below in the report.  She rates her pain as a 10 out of 10 on average it can be intermittent and aching and is worse with standing.  She denies any right-sided complaints in the hip or leg.  No paresthesias.  Review of Systems  Constitutional: Negative for chills, fever, malaise/fatigue and weight loss.  HENT: Negative for hearing loss and sinus pain.   Eyes: Negative for blurred vision, double vision and photophobia.  Respiratory: Negative for cough and shortness of  breath.   Cardiovascular: Negative for chest pain, palpitations and leg swelling.  Gastrointestinal: Negative for abdominal pain, nausea and vomiting.  Genitourinary: Negative for flank pain.  Musculoskeletal: Positive for back pain and joint pain. Negative for myalgias.       Left thigh pain  Skin: Negative for itching and rash.  Neurological: Negative for tremors, focal weakness and weakness.  Endo/Heme/Allergies: Negative.   Psychiatric/Behavioral: Negative for depression.  All other systems reviewed and are negative.  Otherwise per HPI.  Assessment & Plan: Visit Diagnoses:  1. Lumbar radiculopathy   2. Spinal stenosis of lumbar region with neurogenic claudication   3. Spondylolisthesis of lumbar region   4. It band syndrome, left   5. Chronic left-sided low back pain with left-sided sciatica     Plan: Findings:  Chronic worsening low back left hip and leg pain consistent with lumbar stenosis and back pain consistent with facet arthropathy.  MRI is consistent with pretty significant arthritis at L4-5 with listhesis and pretty severe narrowing of the canal.  There is left-sided more than right foraminal and lateral recess narrowing.  This clearly fits with her current situation.  Pain has not been alleviated by medications or activity modification or exercise.  I do think she would be a good candidate for either left L4 transforaminal injection or L5-S1 interlaminar injection.  At 58 years old and with a good amount of mobility and  desire to be active I think she would be a good candidate probably for decompression and possible fusion at this level given the listhesis.  I am going to refer her to Dr. Annell GreeningMark Yates in our practice for surgical evaluation.  We will try to go ahead and get her scheduled for the epidural injection as well.  She is asking about pain medications today and I did talk to her about the fact that chronic long-term narcotics are probably not been to be the answer for this.   I will give her a short course of Tylenol 3 as well as refilling her meloxicam that she can take twice a day temporarily.  Hopefully between that and the injection will get her symptoms to calm down.    Meds & Orders:  Meds ordered this encounter  Medications  . meloxicam (MOBIC) 7.5 MG tablet    Sig: Take 1 tablet (7.5 mg total) by mouth 2 (two) times daily.    Dispense:  60 tablet    Refill:  0  . acetaminophen-codeine (TYLENOL #3) 300-30 MG tablet    Sig: Take 1 tablet by mouth every 6 (six) hours as needed for up to 5 days for moderate pain.    Dispense:  20 tablet    Refill:  0    Orders Placed This Encounter  Procedures  . Ambulatory referral to Orthopedic Surgery    Follow-up: Return for Left L4 transforaminal versus interlaminar injection at L5-S1.   Procedures: No procedures performed  No notes on file   Clinical History: MRI LUMBAR SPINE WITHOUT CONTRAST  TECHNIQUE: Multiplanar, multisequence MR imaging of the lumbar spine was performed. No intravenous contrast was administered.  COMPARISON:  Lumbar radiographs 09/28/2018  FINDINGS: Segmentation:  Normal  Alignment:  7 mm anterolisthesis L4-5.  Remaining alignment normal  Vertebrae: Negative for fracture or mass. Bone marrow edema at L4-5 related to discogenic change.  Conus medullaris and cauda equina: Conus extends to the L1-2 level. Conus and cauda equina appear normal.  Paraspinal and other soft tissues: Negative for paraspinous mass or fluid collection  Disc levels:  L1-2: Negative  L2-3: Negative  L3-4: Mild facet degeneration. Negative for disc protrusion or stenosis  L4-5: 7 mm anterolisthesis. Severe facet degeneration bilaterally. Severe disc degeneration with endplate erosive change and edema. Severe spinal stenosis. Severe subarticular foraminal stenosis left greater than right  L5-S1: Negative  IMPRESSION: Severe spinal stenosis L4-5 with severe subarticular  foraminal stenosis bilaterally left greater than right. Grade 1 anterolisthesis L4-5. Remaining lumbar spine negative   Electronically Signed   By: Marlan Palauharles  Clark M.D.   On: 11/13/2018 15:34   She reports that she quit smoking about 3 years ago. She has never used smokeless tobacco. No results for input(s): HGBA1C, LABURIC in the last 8760 hours.  Objective:  VS:  HT:    WT:   BMI:     BP:(!) 174/86  HR:65bpm  TEMP: ( )  RESP:  Physical Exam Vitals signs and nursing note reviewed.  Constitutional:      General: She is not in acute distress.    Appearance: Normal appearance. She is well-developed. She is obese.  HENT:     Head: Normocephalic and atraumatic.     Nose: Nose normal.     Mouth/Throat:     Mouth: Mucous membranes are moist.     Pharynx: Oropharynx is clear.  Eyes:     Conjunctiva/sclera: Conjunctivae normal.     Pupils: Pupils are equal, round, and  reactive to light.  Neck:     Musculoskeletal: Normal range of motion and neck supple.  Cardiovascular:     Rate and Rhythm: Regular rhythm.  Pulmonary:     Effort: Pulmonary effort is normal. No respiratory distress.  Abdominal:     General: There is no distension.     Palpations: Abdomen is soft.     Tenderness: There is no guarding.  Musculoskeletal:     Right lower leg: No edema.     Left lower leg: No edema.     Comments: Patient has difficulty going into full lumbar extension with pain with facet joint loading.  She does stand with a forward flexed lumbar spine.  She has no hip or groin pain with hip rotation.  She has good distal strength without clonus.  She has no paraspinal tenderness or trigger points.  Skin:    General: Skin is warm and dry.     Findings: No erythema or rash.  Neurological:     General: No focal deficit present.     Mental Status: She is alert and oriented to person, place, and time.     Motor: No abnormal muscle tone.     Coordination: Coordination normal.     Gait: Gait  normal.  Psychiatric:        Mood and Affect: Mood normal.        Behavior: Behavior normal.        Thought Content: Thought content normal.     Ortho Exam Imaging: No results found.  Past Medical/Family/Surgical/Social History: Medications & Allergies reviewed per EMR, new medications updated. Patient Active Problem List   Diagnosis Date Noted  . It band syndrome, left 09/06/2018  . Acute left-sided low back pain without sciatica 07/18/2018  . Osteoarthritis of right hip 06/03/2018  . Chronic right shoulder pain 05/05/2018  . Right hip pain 05/05/2018  . Healthcare maintenance 05/05/2018  . Rupture of anterior cruciate ligament of right knee 02/15/2018  . Sprain of medial collateral ligament of right knee 02/15/2018  . Essential hypertension 02/15/2018   Past Medical History:  Diagnosis Date  . Hyperlipidemia   . Hypertension    History reviewed. No pertinent family history. History reviewed. No pertinent surgical history. Social History   Occupational History  . Not on file  Tobacco Use  . Smoking status: Former Smoker    Last attempt to quit: 01/2015    Years since quitting: 3.9  . Smokeless tobacco: Never Used  Substance and Sexual Activity  . Alcohol use: Not on file  . Drug use: Not on file  . Sexual activity: Not on file

## 2018-12-17 NOTE — Progress Notes (Signed)
Sandra Lozano - 58 y.o. female MRN [id number]  Date of birth: 1961-11-20  Office Visit Note: Visit Date: 12/16/2018 PCP: Myrene Buddy, MD Referred by: Myrene Buddy, MD  Subjective: Chief Complaint  Patient presents with  . Lower Back - Pain  . Left Leg - Pain   HPI:  Sandra Lozano is a 58 y.o. female who comes in today For planned left L5-S1 interlaminar epidural steroid injection.  Please see our prior evaluation and management note for further details and justification.  Rates her pain is 10 out of 10.  Reports Tylenol 3 gave her some itching.  She reports that it did not help much at all.  Hopefully the injection will give her a lot of relief.  Depending on results would look at transforaminal approach.  Patient has surgical evaluation by Dr. Kevan Ny forthcoming.  ROS Otherwise per HPI.  Assessment & Plan: Visit Diagnoses:  1. Lumbar radiculopathy     Plan: No additional findings.   Meds & Orders:  Meds ordered this encounter  Medications  . methylPREDNISolone acetate (DEPO-MEDROL) injection 80 mg    Orders Placed This Encounter  Procedures  . XR C-ARM NO REPORT  . Epidural Steroid injection    Follow-up: Return if symptoms worsen or fail to improve.   Procedures: No procedures performed  Lumbar Epidural Steroid Injection - Interlaminar Approach with Fluoroscopic Guidance  Patient: Sandra Lozano      Date of Birth: [birth year] MRN: [id number] PCP: Myrene Buddy, MD      Visit Date: 12/16/2018   Universal Protocol:     Consent Given By: the patient  Position: PRONE  Additional Comments: Vital signs were monitored before and after the procedure. Patient was prepped and draped in the usual sterile fashion. The correct patient, procedure, and site was verified.   Injection Procedure Details:  Procedure Site One Meds Administered:  Meds ordered this encounter  Medications  . methylPREDNISolone acetate (DEPO-MEDROL) injection 80 mg     Laterality:  Left  Location/Site:  L5-S1  Needle size: 20 G  Needle type: Tuohy  Needle Placement: Paramedian epidural  Findings:   -Comments: Excellent flow of contrast into the epidural space.  Procedure Details: Using a paramedian approach from the side mentioned above, the region overlying the inferior lamina was localized under fluoroscopic visualization and the soft tissues overlying this structure were infiltrated with 4 ml. of 1% Lidocaine without Epinephrine. The Tuohy needle was inserted into the epidural space using a paramedian approach.   The epidural space was localized using loss of resistance along with lateral and bi-planar fluoroscopic views.  After negative aspirate for air, blood, and CSF, a 2 ml. volume of Isovue-250 was injected into the epidural space and the flow of contrast was observed. Radiographs were obtained for documentation purposes.    The injectate was administered into the level noted above.   Additional Comments:  The patient tolerated the procedure well Dressing: Band-Aid    Post-procedure details: Patient was observed during the procedure. Post-procedure instructions were reviewed.  Patient left the clinic in stable condition.   Clinical History: MRI LUMBAR SPINE WITHOUT CONTRAST  TECHNIQUE: Multiplanar, multisequence MR imaging of the lumbar spine was performed. No intravenous contrast was administered.  COMPARISON:  Lumbar radiographs 09/28/2018  FINDINGS: Segmentation:  Normal  Alignment:  7 mm anterolisthesis L4-5.  Remaining alignment normal  Vertebrae: Negative for fracture or mass. Bone marrow edema at L4-5 related to discogenic change.  Conus medullaris and cauda equina: Conus extends to the  L1-2 level. Conus and cauda equina appear normal.  Paraspinal and other soft tissues: Negative for paraspinous mass or fluid collection  Disc levels:  L1-2: Negative  L2-3: Negative  L3-4: Mild facet degeneration. Negative  for disc protrusion or stenosis  L4-5: 7 mm anterolisthesis. Severe facet degeneration bilaterally. Severe disc degeneration with endplate erosive change and edema. Severe spinal stenosis. Severe subarticular foraminal stenosis left greater than right  L5-S1: Negative  IMPRESSION: Severe spinal stenosis L4-5 with severe subarticular foraminal stenosis bilaterally left greater than right. Grade 1 anterolisthesis L4-5. Remaining lumbar spine negative   Electronically Signed   By: Marlan Palau M.D.   On: 11/13/2018 15:34     Objective:  VS:  HT:    WT:   BMI:     BP:(!) 161/86  HR:66bpm  TEMP:98.1 F (36.7 C)(Oral)  RESP:100 % Physical Exam  Ortho Exam Imaging: Xr C-arm No Report  Result Date: 12/16/2018 Please see Notes tab for imaging impression.

## 2018-12-17 NOTE — Procedures (Signed)
Lumbar Epidural Steroid Injection - Interlaminar Approach with Fluoroscopic Guidance  Patient: Sandra Lozano      Date of Birth: Dec 11, 1961 MRN: [id number] PCP: Myrene Buddy, MD      Visit Date: 12/16/2018   Universal Protocol:     Consent Given By: the patient  Position: PRONE  Additional Comments: Vital signs were monitored before and after the procedure. Patient was prepped and draped in the usual sterile fashion. The correct patient, procedure, and site was verified.   Injection Procedure Details:  Procedure Site One Meds Administered:  Meds ordered this encounter  Medications  . methylPREDNISolone acetate (DEPO-MEDROL) injection 80 mg     Laterality: Left  Location/Site:  L5-S1  Needle size: 20 G  Needle type: Tuohy  Needle Placement: Paramedian epidural  Findings:   -Comments: Excellent flow of contrast into the epidural space.  Procedure Details: Using a paramedian approach from the side mentioned above, the region overlying the inferior lamina was localized under fluoroscopic visualization and the soft tissues overlying this structure were infiltrated with 4 ml. of 1% Lidocaine without Epinephrine. The Tuohy needle was inserted into the epidural space using a paramedian approach.   The epidural space was localized using loss of resistance along with lateral and bi-planar fluoroscopic views.  After negative aspirate for air, blood, and CSF, a 2 ml. volume of Isovue-250 was injected into the epidural space and the flow of contrast was observed. Radiographs were obtained for documentation purposes.    The injectate was administered into the level noted above.   Additional Comments:  The patient tolerated the procedure well Dressing: Band-Aid    Post-procedure details: Patient was observed during the procedure. Post-procedure instructions were reviewed.  Patient left the clinic in stable condition.

## 2018-12-28 ENCOUNTER — Ambulatory Visit (INDEPENDENT_AMBULATORY_CARE_PROVIDER_SITE_OTHER): Payer: Self-pay | Admitting: Orthopaedic Surgery

## 2018-12-28 ENCOUNTER — Encounter (INDEPENDENT_AMBULATORY_CARE_PROVIDER_SITE_OTHER): Payer: Self-pay | Admitting: Orthopaedic Surgery

## 2018-12-28 VITALS — BP 190/94 | HR 74 | Ht 64.0 in | Wt 191.0 lb

## 2018-12-28 DIAGNOSIS — M48062 Spinal stenosis, lumbar region with neurogenic claudication: Secondary | ICD-10-CM | POA: Insufficient documentation

## 2018-12-28 NOTE — Progress Notes (Signed)
Office Visit Note   Patient: Sandra Lozano           Date of Birth: Mar 05, 1961           MRN: [id number] Visit Date: 12/28/2018              Requested by: Tyrell Antonio, MD [address] [place], [place] [address] PCP: Myrene Buddy, MD   Assessment & Plan: Visit Diagnoses:  1. Spinal stenosis of lumbar region with neurogenic claudication     Plan: Patient is talking social service for [facility] application.  We discussed surgical treatment will require 1 level decompression instrumented fusion at the L4-5 level.  We reviewed the MRI scan discussed surgical intervention discussed the risks associated with it postop pain use of a brace.  She will call when she finishes with her appointments.  Follow-Up Instructions: Return in about 4 weeks (around 01/25/2019).   Orders:  No orders of the defined types were placed in this encounter.  No orders of the defined types were placed in this encounter.     Procedures: No procedures performed   Clinical Data: No additional findings.   Subjective: Chief Complaint  Patient presents with  . Lower Back - Follow-up    HPI patient returns with persistent back pain left leg pain worse than right leg pain.  She is tried anti-inflammatories activity modification and prednisone without relief.  Injection with Dr. Alvester Morin 12/16/2018 did not give her any relief.  MRI scan done 11/13/2018 showed 7 mm anterolisthesis of L4-5 with degenerative facet endplate erosion edema and severe spinal stenosis.  She had severe subarticular foraminal stenosis left greater than right consistent with her symptoms.  Patient states she can only stand 3 to 5 minutes she can only walk 100 to 200 feet and has to stop sit and rest.  She gets relief with supine position.  Increased pain with turning and twisting.  She has had symptoms for 4 months with the severe pain.  She is taken a prednisone pack anti-inflammatories muscle relaxants and the epidural.  Review  of Systems right knee ACL tear 2 years ago.  She has some osteoarthritis right hip.  Positive for hypertension.  L4-5 degenerative disc with listhesis and severe stenosis.  Patient denies previous surgeries.   Objective: Vital Signs: BP (!) 190/94   Pulse 74   Ht 5\' 4"  (1.626 m)   Wt 191 lb (86.6 kg)   BMI 32.79 kg/m   Physical Exam Constitutional:      Appearance: She is well-developed.  HENT:     Head: Normocephalic.     Right Ear: External ear normal.     Left Ear: External ear normal.  Eyes:     Pupils: Pupils are equal, round, and reactive to light.  Neck:     Thyroid: No thyromegaly.     Trachea: No tracheal deviation.  Cardiovascular:     Rate and Rhythm: Normal rate.  Pulmonary:     Effort: Pulmonary effort is normal.  Abdominal:     Palpations: Abdomen is soft.  Skin:    General: Skin is warm and dry.  Neurological:     Mental Status: She is alert and oriented to person, place, and time.  Psychiatric:        Behavior: Behavior normal.     Ortho Exam patient has negative straight leg raising 90 degrees..  Anterior tib EHL gastrocsoleus is strong.  Patient is amatory without a limp.  Distal pulses are intact.  She has sciatic notch tenderness left greater than right.  Mild trochanteric bursal tenderness.  Negative Faber test.  Knee and ankle jerk are intact.  Specialty Comments:  No specialty comments available.  Imaging: No results found.   PMFS History: Patient Active Problem List   Diagnosis Date Noted  . It band syndrome, left 09/06/2018  . Acute left-sided low back pain without sciatica 07/18/2018  . Osteoarthritis of right hip 06/03/2018  . Chronic right shoulder pain 05/05/2018  . Right hip pain 05/05/2018  . Healthcare maintenance 05/05/2018  . Rupture of anterior cruciate ligament of right knee 02/15/2018  . Sprain of medial collateral ligament of right knee 02/15/2018  . Essential hypertension 02/15/2018   Past Medical History:  Diagnosis  Date  . Hyperlipidemia   . Hypertension     No family history on file.  No past surgical history on file. Social History   Occupational History  . Not on file  Tobacco Use  . Smoking status: Former Smoker    Last attempt to quit: 01/2015    Years since quitting: 3.9  . Smokeless tobacco: Never Used  Substance and Sexual Activity  . Alcohol use: Not on file  . Drug use: Not on file  . Sexual activity: Not on file

## 2019-01-07 ENCOUNTER — Telehealth (INDEPENDENT_AMBULATORY_CARE_PROVIDER_SITE_OTHER): Payer: Self-pay | Admitting: Orthopaedic Surgery

## 2019-01-07 ENCOUNTER — Telehealth: Payer: Self-pay | Admitting: Family Medicine

## 2019-01-07 NOTE — Telephone Encounter (Signed)
Blue sheet done thanks 

## 2019-01-07 NOTE — Telephone Encounter (Signed)
Please advise. Per last office note, surgery has already been discussed.

## 2019-01-07 NOTE — Telephone Encounter (Signed)
Patient here in the office now (sitting in a wheelchair).  She states she has been approved for medicaid.  Eligibility has been verified. Her next appointment is 01-14-19

## 2019-01-07 NOTE — Telephone Encounter (Signed)
Patient here in the office now (sitting in a wheelchair).  She states she has been approved for medicaid.  Eligibility has been verified. Her next appointment with our office is 01-14-19.  She says that a week is too long to wait when she is in so much pain.  Patient wants to know if she can go ahead a schedule her surgery now.

## 2019-01-07 NOTE — Telephone Encounter (Signed)
Pt came in office brought a medical clearance form and [employer] fax this form at the same time. Form was placed in Red team folder.

## 2019-01-10 NOTE — Telephone Encounter (Signed)
Reviewed form and placed in PCP's box for completion.   .Deegan Valentino R, CMA  

## 2019-01-11 NOTE — Telephone Encounter (Signed)
Completed form found in nurse box. Faxed to [place], attn: Debbie at [phone]. Copy made for batch scanning. Ples Specter, RN [facility] RN)

## 2019-01-11 NOTE — Telephone Encounter (Signed)
Form placed in nurse's box  Ulus Hazen MD PGY-2 Family Medicine Resident 

## 2019-01-14 ENCOUNTER — Ambulatory Visit (INDEPENDENT_AMBULATORY_CARE_PROVIDER_SITE_OTHER): Payer: Medicaid Other | Admitting: Orthopaedic Surgery

## 2019-01-18 ENCOUNTER — Ambulatory Visit (INDEPENDENT_AMBULATORY_CARE_PROVIDER_SITE_OTHER): Payer: Medicaid Other | Admitting: *Deleted

## 2019-01-18 ENCOUNTER — Encounter (INDEPENDENT_AMBULATORY_CARE_PROVIDER_SITE_OTHER): Payer: Self-pay | Admitting: Orthopaedic Surgery

## 2019-01-18 ENCOUNTER — Ambulatory Visit (INDEPENDENT_AMBULATORY_CARE_PROVIDER_SITE_OTHER): Payer: Medicaid Other | Admitting: Orthopaedic Surgery

## 2019-01-18 VITALS — BP 169/86 | HR 81 | Ht 64.0 in | Wt 191.0 lb

## 2019-01-18 DIAGNOSIS — M532X6 Spinal instabilities, lumbar region: Secondary | ICD-10-CM | POA: Diagnosis not present

## 2019-01-18 DIAGNOSIS — Z111 Encounter for screening for respiratory tuberculosis: Secondary | ICD-10-CM | POA: Diagnosis not present

## 2019-01-18 DIAGNOSIS — M48062 Spinal stenosis, lumbar region with neurogenic claudication: Secondary | ICD-10-CM

## 2019-01-18 NOTE — Progress Notes (Signed)
Office Visit Note   Patient: Sandra Lozano           Date of Birth: [birth year]           MRN: [id number] Visit Date: 01/18/2019              Requested by: Myrene Buddy, MD [phone] [address] [place], [place] [address] PCP: Myrene Buddy, MD   Assessment & Plan: Visit Diagnoses:  1. Spinal stenosis of lumbar region with neurogenic claudication   2. Spinal instability, lumbar     Plan: Copies were made last office note for patient as well as MRI so she can take them to social service.  She is going through financial evaluation for Medicaid application will contact us once they have made a decision.  Plan would be 1 level lumbar instrumented fusion for instability at the L4-5 level.  Follow-Up Instructions: No follow-ups on file.   Orders:  No orders of the defined types were placed in this encounter.  No orders of the defined types were placed in this encounter.     Procedures: No procedures performed   Clinical Data: No additional findings.   Subjective: Chief Complaint  Patient presents with  . Lower Back - Pain, Follow-up    HPI patient returns with ongoing back pain neurogenic claudication symptoms.  She is using Biofreeze Tylenol topical pads.  MRI scan shows severe stenosis with 7 mm of anterolisthesis that shifts with flexion extension.  She has had epidurals prednisone pack anti-inflammatories muscle relaxants without relief.  She has had appointment with social service going through financial screening for application for Medicaid.  We discussed again 1 level fusion for instability and severe spinal stenosis.  Erosive endplate changes and edema of the bone.  No chills or fever she gets relief with sitting or supine position.  Review of Systems positive for right knee ACL tear 2 years ago.  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  Severe spinal stenosis with instability L4-5.  No previous surgeries.  Patient's been on prednisone 20 mg daily  for 1 month starting in November.  She takes metformin for diabetes.  Also Lipitor for hypercholesterolemia.  Positive for hypertension on Zestoretic.   Objective: Vital Signs: BP (!) 169/86   Pulse 81   Ht 5\' 4"  (1.626 m)   Wt 191 lb (86.6 kg)   BMI 32.79 kg/m   Physical Exam Constitutional:      Appearance: She is well-developed.  HENT:     Head: Normocephalic.     Right Ear: External ear normal.     Left Ear: External ear normal.  Eyes:     Pupils: Pupils are equal, round, and reactive to light.  Neck:     Thyroid: No thyromegaly.     Trachea: No tracheal deviation.  Cardiovascular:     Rate and Rhythm: Normal rate.  Pulmonary:     Effort: Pulmonary effort is normal.  Abdominal:     Palpations: Abdomen is soft.  Skin:    General: Skin is warm and dry.  Neurological:     Mental Status: She is alert and oriented to person, place, and time.  Psychiatric:        Behavior: Behavior normal.     Ortho Exam patient has some scratch marks over the lumbar spine in the area where she has been applying the cream there are 6 or 7 small areas 1 to 2 mm that almost look like small bug  bite.  No static notch tenderness.  Negative straight leg raising.  No anterior tib gastrocsoleus weakness.  She can only ambulate 100 feet. Specialty Comments:  No specialty comments available.  Imaging: No results found.   PMFS History: Patient Active Problem List   Diagnosis Date Noted  . Spinal instability, lumbar 01/18/2019  . Spinal stenosis of lumbar region with neurogenic claudication 12/28/2018  . It band syndrome, left 09/06/2018  . Acute left-sided low back pain without sciatica 07/18/2018  . Osteoarthritis of right hip 06/03/2018  . Chronic right shoulder pain 05/05/2018  . Right hip pain 05/05/2018  . Healthcare maintenance 05/05/2018  . Rupture of anterior cruciate ligament of right knee 02/15/2018  . Sprain of medial collateral ligament of right knee 02/15/2018  . Essential  hypertension 02/15/2018   Past Medical History:  Diagnosis Date  . Hyperlipidemia   . Hypertension     No family history on file.  No past surgical history on file. Social History   Occupational History  . Not on file  Tobacco Use  . Smoking status: Former Smoker    Last attempt to quit: 01/2015    Years since quitting: 4.0  . Smokeless tobacco: Never Used  Substance and Sexual Activity  . Alcohol use: Not on file  . Drug use: Not on file  . Sexual activity: Not on file

## 2019-01-18 NOTE — Progress Notes (Signed)
Patient is here for a PPD placement.  PPD placed in Left forearm.  Patient will return 01/21/2019 to have PPD read. Tahra Hitzeman, Maryjo Rochester, CMA

## 2019-01-21 ENCOUNTER — Ambulatory Visit (INDEPENDENT_AMBULATORY_CARE_PROVIDER_SITE_OTHER): Payer: Medicaid Other

## 2019-01-21 DIAGNOSIS — Z111 Encounter for screening for respiratory tuberculosis: Secondary | ICD-10-CM

## 2019-01-21 LAB — TB SKIN TEST
Induration: 0 mm
TB Skin Test: NEGATIVE

## 2019-01-21 NOTE — Progress Notes (Signed)
   Patient here today to have PPD site read.   PPD read and results entered in Epic. Result: 0 mm induration. Interpretation: Negative Letter given for employer.  Makynlee Kressin, RN ([facility] RN) 

## 2019-01-28 ENCOUNTER — Encounter (INDEPENDENT_AMBULATORY_CARE_PROVIDER_SITE_OTHER): Payer: Self-pay | Admitting: Orthopaedic Surgery

## 2019-01-28 NOTE — Telephone Encounter (Signed)
To Debbie

## 2019-02-14 ENCOUNTER — Telehealth (INDEPENDENT_AMBULATORY_CARE_PROVIDER_SITE_OTHER): Payer: Self-pay | Admitting: Radiology

## 2019-02-14 NOTE — Telephone Encounter (Signed)
Paperwork was received from [facility] requiring post operative plan of care prior to surgery approval. Per Dr. Ophelia Charter, patient will need a two night stay in the hospital, a back brace, and a walker.  She will not require home health PT.  Letter drafted and given to Amy to send in.

## 2019-02-15 ENCOUNTER — Encounter (INDEPENDENT_AMBULATORY_CARE_PROVIDER_SITE_OTHER): Payer: Self-pay | Admitting: Orthopaedic Surgery

## 2019-02-28 ENCOUNTER — Encounter (INDEPENDENT_AMBULATORY_CARE_PROVIDER_SITE_OTHER): Payer: Self-pay | Admitting: Orthopaedic Surgery

## 2019-03-01 ENCOUNTER — Other Ambulatory Visit: Payer: Self-pay

## 2019-03-01 ENCOUNTER — Ambulatory Visit (INDEPENDENT_AMBULATORY_CARE_PROVIDER_SITE_OTHER): Payer: Self-pay | Admitting: Orthopaedic Surgery

## 2019-03-01 ENCOUNTER — Encounter (INDEPENDENT_AMBULATORY_CARE_PROVIDER_SITE_OTHER): Payer: Self-pay | Admitting: Orthopaedic Surgery

## 2019-03-01 VITALS — BP 183/80 | HR 75 | Ht 64.0 in | Wt 190.0 lb

## 2019-03-01 DIAGNOSIS — M532X6 Spinal instabilities, lumbar region: Secondary | ICD-10-CM

## 2019-03-01 DIAGNOSIS — M48062 Spinal stenosis, lumbar region with neurogenic claudication: Secondary | ICD-10-CM

## 2019-03-01 NOTE — Progress Notes (Signed)
Office Visit Note   Patient: Sandra Lozano           Date of Birth: 12/06/1961           MRN: [id number] Visit Date: 03/01/2019              Requested by: Myrene Buddy, MD [phone] [address] [place], [place] [address] PCP: Myrene Buddy, MD   Assessment & Plan: Visit Diagnoses:  1. Spinal stenosis of lumbar region with neurogenic claudication   2. Spinal instability, lumbar     Plan: still pre-op pending insurance approval.  Patient has family at home husband and children and plan to be 1 or 2 night stay in the hospital and then discharged home she would use her brace but would not require physical therapy.  She would be ambulatory with a walker for period of a week or 2 until she went to a cane and then nothing.  Wrist surgery was discussed and reviewed once again.  Handicap parking sticker given x6 months.  Follow-Up Instructions: No follow-ups on file.   Orders:  No orders of the defined types were placed in this encounter.  No orders of the defined types were placed in this encounter.     Procedures: No procedures performed   Clinical Data: No additional findings.   Subjective: Chief Complaint  Patient presents with   Lower Back - Pain, Follow-up    HPI 58 year old female returns with ongoing problems with neurogenic claudication and pain with standing she gets relief with sitting she cannot stand long not able to wash dishes cook clean has trouble going to grocery store unless she leans on a cart.  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  When standing she gets weakness in her legs.  She has had previous treatment with anti-inflammatories muscle relaxants topical creams epidural steroid injections prednisone Dosepak all without relief.  MRI scan lumbar showed 7 mm anterolisthesis with severe subarticular foraminal stenosis at L4-5 and severe central stenosis. As I stated before patient needs a single level  decompression fusion operation for stenosis and instability.   stenosis.  Remaining levels were normal.  Review of Systems 14 point review of systems updated unchanged from 01/18/2019 office visit.   Objective: Vital Signs: BP (!) 183/80    Pulse 75    Ht 5\' 4"  (1.626 m)    Wt 190 lb (86.2 kg)    BMI 32.61 kg/m   Physical Exam Constitutional:      Appearance: She is well-developed.  HENT:     Head: Normocephalic.     Right Ear: External ear normal.     Left Ear: External ear normal.  Eyes:     Pupils: Pupils are equal, round, and reactive to light.  Neck:     Thyroid: No thyromegaly.     Trachea: No tracheal deviation.  Cardiovascular:     Rate and Rhythm: Normal rate.  Pulmonary:     Effort: Pulmonary effort is normal.  Abdominal:     Palpations: Abdomen is soft.  Skin:    General: Skin is warm and dry.  Neurological:     Mental Status: She is alert and oriented to person, place, and time.  Psychiatric:        Behavior: Behavior normal.     Ortho Exam patient has negative logroll to the hips.  Negative straight leg raising 90 degrees.  Anterior tib gastrocsoleus are strong.  She ambulates  100 feet have to stop and sit down.  Specialty Comments:  No specialty comments available.  Imaging: Study Result   CLINICAL DATA:  Radicular pain left lower extremity  EXAM: MRI LUMBAR SPINE WITHOUT CONTRAST  TECHNIQUE: Multiplanar, multisequence MR imaging of the lumbar spine was performed. No intravenous contrast was administered.  COMPARISON:  Lumbar radiographs 09/28/2018  FINDINGS: Segmentation:  Normal  Alignment:  7 mm anterolisthesis L4-5.  Remaining alignment normal  Vertebrae: Negative for fracture or mass. Bone marrow edema at L4-5 related to discogenic change.  Conus medullaris and cauda equina: Conus extends to the L1-2 level. Conus and cauda equina appear normal.  Paraspinal and other soft tissues: Negative for paraspinous mass or fluid  collection  Disc levels:  L1-2: Negative  L2-3: Negative  L3-4: Mild facet degeneration. Negative for disc protrusion or stenosis  L4-5: 7 mm anterolisthesis. Severe facet degeneration bilaterally. Severe disc degeneration with endplate erosive change and edema. Severe spinal stenosis. Severe subarticular foraminal stenosis left greater than right  L5-S1: Negative  IMPRESSION: Severe spinal stenosis L4-5 with severe subarticular foraminal stenosis bilaterally left greater than right. Grade 1 anterolisthesis L4-5. Remaining lumbar spine negative   Electronically Signed   By: Marlan Palau M.D.   On: 11/13/2018 15:34      PMFS History: Patient Active Problem List   Diagnosis Date Noted   Spinal instability, lumbar 01/18/2019   Spinal stenosis of lumbar region with neurogenic claudication 12/28/2018   It band syndrome, left 09/06/2018   Acute left-sided low back pain without sciatica 07/18/2018   Osteoarthritis of right hip 06/03/2018   Chronic right shoulder pain 05/05/2018   Right hip pain 05/05/2018   Healthcare maintenance 05/05/2018   Rupture of anterior cruciate ligament of right knee 02/15/2018   Sprain of medial collateral ligament of right knee 02/15/2018   Essential hypertension 02/15/2018   Past Medical History:  Diagnosis Date   Hyperlipidemia    Hypertension     No family history on file.  No past surgical history on file. Social History   Occupational History   Not on file  Tobacco Use   Smoking status: Former Smoker    Last attempt to quit: 01/2015    Years since quitting: 4.1   Smokeless tobacco: Never Used  Substance and Sexual Activity   Alcohol use: Not on file   Drug use: Not on file   Sexual activity: Not on file

## 2019-03-01 NOTE — Telephone Encounter (Signed)
Patient came in to the office, same day as message sent, and spoke with Toniann Fail. She is worked in for an appointment this afternoon with Dr. Ophelia Charter to discuss. As of 02/28/2019, insurance approval was still pending for surgery per Amy.

## 2019-03-08 ENCOUNTER — Encounter (INDEPENDENT_AMBULATORY_CARE_PROVIDER_SITE_OTHER): Payer: Self-pay | Admitting: Orthopaedic Surgery

## 2019-03-09 ENCOUNTER — Encounter (INDEPENDENT_AMBULATORY_CARE_PROVIDER_SITE_OTHER): Payer: Self-pay | Admitting: Orthopaedic Surgery

## 2019-03-16 ENCOUNTER — Encounter (INDEPENDENT_AMBULATORY_CARE_PROVIDER_SITE_OTHER): Payer: Self-pay | Admitting: Orthopaedic Surgery

## 2019-03-31 ENCOUNTER — Telehealth (INDEPENDENT_AMBULATORY_CARE_PROVIDER_SITE_OTHER): Payer: Self-pay

## 2019-03-31 NOTE — Telephone Encounter (Signed)
FYI-surgery has been approved per [employer] site. I sent message to Sandra Lozano so she will know ok to schedule once we get the ok from the hospital to start scheduling again. I tried to call patient to advise but didn't get an answer so just wanted to let you know in case you hear from her.

## 2019-03-31 NOTE — Telephone Encounter (Signed)
Noted. I left voicemail on patient's cell to advise.

## 2019-04-12 ENCOUNTER — Encounter (INDEPENDENT_AMBULATORY_CARE_PROVIDER_SITE_OTHER): Payer: Self-pay | Admitting: Orthopaedic Surgery

## 2019-04-25 ENCOUNTER — Encounter: Payer: Self-pay | Admitting: Orthopaedic Surgery

## 2019-05-02 ENCOUNTER — Other Ambulatory Visit: Payer: Self-pay

## 2019-05-13 ENCOUNTER — Encounter: Payer: Self-pay | Admitting: Orthopaedic Surgery

## 2019-05-16 ENCOUNTER — Other Ambulatory Visit (INDEPENDENT_AMBULATORY_CARE_PROVIDER_SITE_OTHER): Payer: Self-pay | Admitting: Physician Assistant

## 2019-05-16 ENCOUNTER — Encounter: Payer: Self-pay | Admitting: Orthopaedic Surgery

## 2019-05-16 ENCOUNTER — Other Ambulatory Visit (INDEPENDENT_AMBULATORY_CARE_PROVIDER_SITE_OTHER): Payer: Self-pay | Admitting: Physical Medicine and Rehabilitation

## 2019-05-16 ENCOUNTER — Encounter: Payer: Self-pay | Admitting: Family Medicine

## 2019-05-16 NOTE — Telephone Encounter (Signed)
Rx request 

## 2019-05-17 ENCOUNTER — Encounter: Payer: Self-pay | Admitting: Radiology

## 2019-05-17 ENCOUNTER — Telehealth: Payer: Self-pay

## 2019-05-17 MED ORDER — ACETAMINOPHEN-CODEINE #3 300-30 MG PO TABS: 1.0000 | ORAL_TABLET | Freq: Four times a day (QID) | ORAL | 0 refills | Status: DC | PRN

## 2019-05-17 NOTE — Telephone Encounter (Signed)
Dr. Ophelia Charter has her scheduled for surgery in a few days and I had only given an RX in Jan? See what Dr. Ophelia Charter thinks, if needed get back to me

## 2019-05-17 NOTE — Telephone Encounter (Signed)
Patient left VM stating she needs note for being out of work Please call patient  [phone]

## 2019-05-17 NOTE — Progress Notes (Signed)
[facility] [address] [place], [place] - [address] [place] [address] Phone: [phone] Fax: [phone]      Your procedure is scheduled on June 5  Report to [facility] Main Entrance "A" at 0530 A.M., and check in at the Admitting office.  Call this number if you have problems the morning of surgery:  [phone]  Call [phone] if you have any questions prior to your surgery date Monday-Friday 8am-4pm    Remember:  Do not eat after midnight.  You may drink clear liquids until 0430 am  Clear liquids allowed are: Water, Non-Citrus Juices (without pulp), Carbonated Beverages, Clear Tea, Black Coffee Only, and Gatorade  Please complete your G2 Gatorade that was provided to you by 0430 am the morning of surgery.  Please, if able, drink it in one setting. DO NOT SIP.     Take these medicines the morning of surgery with A SIP OF WATER  albuterol (PROVENTIL) (2.5 MG/3ML) atorvastatin (LIPITOR   7 days prior to surgery STOP taking any meloxicam (MOBIC), Aspirin (unless otherwise instructed by your surgeon), Aleve, Naproxen, Ibuprofen, Motrin, Advil, Goody's, BC's, all herbal medications, fish oil, and all vitamins.   WHAT DO I DO ABOUT MY DIABETES MEDICATION?   Marland Kitchen Do not take oral diabetes medicines (pills) the morning of surgery. metFORMIN (GLUCOPHAGE)   How to Manage Your Diabetes Before and After Surgery  Why is it important to control my blood sugar before and after surgery? . Improving blood sugar levels before and after surgery helps healing and can limit problems. . A way of improving blood sugar control is eating a healthy diet by: o  Eating less sugar and carbohydrates o  Increasing activity/exercise o  Talking with your doctor about reaching your blood sugar goals . High blood sugars (greater than 180 mg/dL) can raise your risk of infections and slow your recovery, so you will need to focus on controlling your  diabetes during the weeks before surgery. . Make sure that the doctor who takes care of your diabetes knows about your planned surgery including the date and location.  How do I manage my blood sugar before surgery? . Check your blood sugar at least 4 times a day, starting 2 days before surgery, to make sure that the level is not too high or low. o Check your blood sugar the morning of your surgery when you wake up and every 2 hours until you get to the Short Stay unit. . If your blood sugar is less than 70 mg/dL, you will need to treat for low blood sugar: o Do not take insulin. o Treat a low blood sugar (less than 70 mg/dL) with  cup of clear juice (cranberry or apple), 4 glucose tablets, OR glucose gel. o Recheck blood sugar in 15 minutes after treatment (to make sure it is greater than 70 mg/dL). If your blood sugar is not greater than 70 mg/dL on recheck, call [phone] for further instructions. . Report your blood sugar to the short stay nurse when you get to Short Stay.  . If you are admitted to the hospital after surgery: o Your blood sugar will be checked by the staff and you will probably be given insulin after surgery (instead of oral diabetes medicines) to make sure you have good blood sugar levels. o The goal for blood sugar control after surgery is 80-180 mg/dL.   The Morning of Surgery  Do not wear jewelry, make-up or nail  polish.  Do not wear lotions, powders, or perfumes/colognes, or deodorant  Do not shave 48 hours prior to surgery.  Men may shave face and neck.  Do not bring valuables to the hospital.  [facility] is not responsible for any belongings or valuables.  If you are a smoker, DO NOT Smoke 24 hours prior to surgery IF you wear a CPAP at night please bring your mask, tubing, and machine the morning of surgery   Remember that you must have someone to transport you home after your surgery, and remain with you for 24 hours if you are discharged the same  day.   Contacts, glasses, hearing aids, dentures or bridgework may not be worn into surgery.    Leave your suitcase in the car.  After surgery it may be brought to your room.  For patients admitted to the hospital, discharge time will be determined by your treatment team.  Patients discharged the day of surgery will not be allowed to drive home.    Special instructions:   [facility]- Preparing For Surgery  Before surgery, you can play an important role. Because skin is not sterile, your skin needs to be as free of germs as possible. You can reduce the number of germs on your skin by washing with CHG (chlorahexidine gluconate) Soap before surgery.  CHG is an antiseptic cleaner which kills germs and bonds with the skin to continue killing germs even after washing.    Oral Hygiene is also important to reduce your risk of infection.  Remember - BRUSH YOUR TEETH THE MORNING OF SURGERY WITH YOUR REGULAR TOOTHPASTE  Please do not use if you have an allergy to CHG or antibacterial soaps. If your skin becomes reddened/irritated stop using the CHG.  Do not shave (including legs and underarms) for at least 48 hours prior to first CHG shower. It is OK to shave your face.  Please follow these instructions carefully.   1. Shower the NIGHT BEFORE SURGERY and the MORNING OF SURGERY with CHG Soap.   2. If you chose to wash your hair, wash your hair first as usual with your normal shampoo.  3. After you shampoo, rinse your hair and body thoroughly to remove the shampoo.  4. Use CHG as you would any other liquid soap. You can apply CHG directly to the skin and wash gently with a scrungie or a clean washcloth.   5. Apply the CHG Soap to your body ONLY FROM THE NECK DOWN.  Do not use on open wounds or open sores. Avoid contact with your eyes, ears, mouth and genitals (private parts). Wash Face and genitals (private parts)  with your normal soap.   6. Wash thoroughly, paying special attention to the  area where your surgery will be performed.  7. Thoroughly rinse your body with warm water from the neck down.  8. DO NOT shower/wash with your normal soap after using and rinsing off the CHG Soap.  9. Pat yourself dry with a CLEAN TOWEL.  10. Wear CLEAN PAJAMAS to bed the night before surgery, wear comfortable clothes the morning of surgery  11. Place CLEAN SHEETS on your bed the night of your first shower and DO NOT SLEEP WITH PETS.    Day of Surgery:  Do not apply any deodorants/lotions.  Please wear clean clothes to the hospital/surgery center.   Remember to brush your teeth WITH YOUR REGULAR TOOTHPASTE.   Please read over the following fact sheets that you were given.

## 2019-05-17 NOTE — Telephone Encounter (Signed)
Can you inquire?  Thanks

## 2019-05-17 NOTE — Telephone Encounter (Signed)
Medication called to pharmacy per approval from Dr. Ophelia Charter.

## 2019-05-17 NOTE — Addendum Note (Signed)
Addended by: Rogers Seeds on: 05/17/2019 10:55 AM   Modules accepted: Orders

## 2019-05-17 NOTE — Telephone Encounter (Signed)
I called patient. She would like to pick up note. Note at front for pick up.

## 2019-05-17 NOTE — Telephone Encounter (Signed)
Note entered into chart. Patient advised. Asked for fax number to fax for her.

## 2019-05-17 NOTE — Telephone Encounter (Signed)
Can you please advise? Patient has surgery scheduled this week.

## 2019-05-18 ENCOUNTER — Encounter (HOSPITAL_COMMUNITY): Payer: Self-pay

## 2019-05-18 ENCOUNTER — Encounter (HOSPITAL_COMMUNITY)
Admission: RE | Admit: 2019-05-18 | Discharge: 2019-05-18 | Disposition: A | Discharge status: Home / Self Care | Payer: Medicaid Other | Source: Ambulatory Visit | Attending: Orthopaedic Surgery | Admitting: Orthopaedic Surgery

## 2019-05-18 ENCOUNTER — Other Ambulatory Visit: Payer: Self-pay

## 2019-05-18 ENCOUNTER — Other Ambulatory Visit (HOSPITAL_COMMUNITY)
Admission: RE | Admit: 2019-05-18 | Discharge: 2019-05-18 | Disposition: A | Discharge status: Home / Self Care | Payer: HRSA Program | Source: Ambulatory Visit | Attending: Orthopaedic Surgery | Admitting: Orthopaedic Surgery

## 2019-05-18 DIAGNOSIS — Z01818 Encounter for other preprocedural examination: Secondary | ICD-10-CM | POA: Diagnosis not present

## 2019-05-18 DIAGNOSIS — Z1159 Encounter for screening for other viral diseases: Secondary | ICD-10-CM | POA: Diagnosis present

## 2019-05-18 HISTORY — DX: Dyspnea, unspecified: R06.00

## 2019-05-18 HISTORY — DX: Presence of dental prosthetic device (complete) (partial): Z97.2

## 2019-05-18 HISTORY — DX: Unspecified osteoarthritis, unspecified site: M19.90

## 2019-05-18 HISTORY — DX: Prediabetes: R73.03

## 2019-05-18 LAB — CBC
HCT: 41.2 % (ref 36.0–46.0)
Hemoglobin: 13.3 g/dL (ref 12.0–15.0)
MCH: 27.8 pg (ref 26.0–34.0)
MCHC: 32.3 g/dL (ref 30.0–36.0)
MCV: 86.2 fL (ref 80.0–100.0)
Platelets: 356 10*3/uL (ref 150–400)
RBC: 4.78 MIL/uL (ref 3.87–5.11)
RDW: 12.6 % (ref 11.5–15.5)
WBC: 10.5 10*3/uL (ref 4.0–10.5)
nRBC: 0.2 % (ref 0.0–0.2)

## 2019-05-18 LAB — TYPE AND SCREEN
ABO/RH(D): B POS
Antibody Screen: NEGATIVE

## 2019-05-18 LAB — COMPREHENSIVE METABOLIC PANEL
ALT: 21 U/L (ref 0–44)
AST: 16 U/L (ref 15–41)
Albumin: 4 g/dL (ref 3.5–5.0)
Alkaline Phosphatase: 97 U/L (ref 38–126)
Anion gap: 12 (ref 5–15)
BUN: 15 mg/dL (ref 6–20)
CO2: 24 mmol/L (ref 22–32)
Calcium: 10 mg/dL (ref 8.9–10.3)
Chloride: 103 mmol/L (ref 98–111)
Creatinine, Ser: 0.6 mg/dL (ref 0.44–1.00)
GFR calc Af Amer: >60 mL/min (ref >60)
GFR calc non Af Amer: >60 mL/min (ref >60)
Glucose, Bld: 112 mg/dL — ABNORMAL HIGH (ref 70–99)
Potassium: 3.8 mmol/L (ref 3.5–5.1)
Sodium: 139 mmol/L (ref 135–145)
Total Bilirubin: 0.6 mg/dL (ref 0.3–1.2)
Total Protein: 7.5 g/dL (ref 6.5–8.1)

## 2019-05-18 LAB — URINALYSIS, ROUTINE W REFLEX MICROSCOPIC
Bilirubin Urine: NEGATIVE
Glucose, UA: NEGATIVE mg/dL
Hgb urine dipstick: NEGATIVE
Ketones, ur: NEGATIVE mg/dL
Leukocytes,Ua: NEGATIVE
Nitrite: NEGATIVE
Protein, ur: NEGATIVE mg/dL
Specific Gravity, Urine: 1.025 (ref 1.005–1.030)
pH: 5 (ref 5.0–8.0)

## 2019-05-18 LAB — GLUCOSE, CAPILLARY: Glucose-Capillary: 89 mg/dL (ref 70–99)

## 2019-05-18 LAB — SURGICAL PCR SCREEN
MRSA, PCR: NEGATIVE
Staphylococcus aureus: POSITIVE — AB

## 2019-05-18 LAB — SARS CORONAVIRUS 2 BY RT PCR (HOSPITAL ORDER, PERFORMED IN ~~LOC~~ HOSPITAL LAB): SARS Coronavirus 2: NEGATIVE

## 2019-05-18 NOTE — Progress Notes (Signed)
Called patient to notify her of the Surgical PCR results.  Patient was positive for Staph.  Surgery on Friday, 05/20/19, will treat with Profend on DOS.  Patient verbalized understanding.

## 2019-05-18 NOTE — Progress Notes (Signed)
PCP -  Dr Myrene Buddy  Cardiologist - Denies  Chest x-ray - Denies EKG - 05/18/19 Stress Test - Denies ECHO - Denies Cardiac Cath - Denies  ICD Pacemaker/Loop-Denies  Sleep Study - Denies CPAP - N/A Fasting Blood Sugar - Patient does not check her blood sugars - "pre-Diabetic per patient" Checks Blood Sugar __0___ times a day  ERAS: G2 Low Sugar drink given at PAT appt.  Anesthesia review: No  Patient denies shortness of breath, fever, cough and chest pain at PAT appointment  Patient verbalized understanding of instructions that were given to them at the PAT appointment. Patient was also instructed that they will need to review over the PAT instructions again at home before surgery.  Patient informed of the Hospital Visitation Restriction Policy that is currently in effect.  Patient verbalized understanding.

## 2019-05-19 LAB — ABO/RH: ABO/RH(D): B POS

## 2019-05-20 ENCOUNTER — Ambulatory Visit (HOSPITAL_COMMUNITY): Payer: Medicaid Other

## 2019-05-20 ENCOUNTER — Ambulatory Visit (HOSPITAL_COMMUNITY): Payer: Medicaid Other | Admitting: Anesthesiology

## 2019-05-20 ENCOUNTER — Encounter (HOSPITAL_COMMUNITY): Payer: Self-pay | Admitting: *Deleted

## 2019-05-20 ENCOUNTER — Other Ambulatory Visit: Payer: Self-pay

## 2019-05-20 ENCOUNTER — Inpatient Hospital Stay (HOSPITAL_COMMUNITY)
Admission: RE | Admit: 2019-05-20 | Discharge: 2019-05-22 | DRG: 455 | Disposition: A | Discharge status: Home / Self Care | Payer: Medicaid Other | Attending: Orthopaedic Surgery | Admitting: Orthopaedic Surgery

## 2019-05-20 ENCOUNTER — Encounter (HOSPITAL_COMMUNITY)
Admission: RE | Disposition: A | Discharge status: Home / Self Care | Payer: Self-pay | Source: Home / Self Care | Attending: Orthopaedic Surgery

## 2019-05-20 DIAGNOSIS — Z7984 Long term (current) use of oral hypoglycemic drugs: Secondary | ICD-10-CM

## 2019-05-20 DIAGNOSIS — I10 Essential (primary) hypertension: Secondary | ICD-10-CM | POA: Diagnosis present

## 2019-05-20 DIAGNOSIS — M48062 Spinal stenosis, lumbar region with neurogenic claudication: Secondary | ICD-10-CM | POA: Diagnosis not present

## 2019-05-20 DIAGNOSIS — Z791 Long term (current) use of non-steroidal anti-inflammatories (NSAID): Secondary | ICD-10-CM

## 2019-05-20 DIAGNOSIS — M48061 Spinal stenosis, lumbar region without neurogenic claudication: Secondary | ICD-10-CM | POA: Diagnosis not present

## 2019-05-20 DIAGNOSIS — M1711 Unilateral primary osteoarthritis, right knee: Secondary | ICD-10-CM | POA: Diagnosis not present

## 2019-05-20 DIAGNOSIS — Z972 Presence of dental prosthetic device (complete) (partial): Secondary | ICD-10-CM | POA: Diagnosis not present

## 2019-05-20 DIAGNOSIS — Z87891 Personal history of nicotine dependence: Secondary | ICD-10-CM | POA: Diagnosis not present

## 2019-05-20 DIAGNOSIS — E785 Hyperlipidemia, unspecified: Secondary | ICD-10-CM | POA: Diagnosis not present

## 2019-05-20 DIAGNOSIS — M532X6 Spinal instabilities, lumbar region: Secondary | ICD-10-CM

## 2019-05-20 DIAGNOSIS — M7632 Iliotibial band syndrome, left leg: Secondary | ICD-10-CM | POA: Diagnosis present

## 2019-05-20 DIAGNOSIS — Z9851 Tubal ligation status: Secondary | ICD-10-CM

## 2019-05-20 DIAGNOSIS — E119 Type 2 diabetes mellitus without complications: Secondary | ICD-10-CM | POA: Diagnosis not present

## 2019-05-20 DIAGNOSIS — M1611 Unilateral primary osteoarthritis, right hip: Secondary | ICD-10-CM | POA: Diagnosis not present

## 2019-05-20 DIAGNOSIS — K08109 Complete loss of teeth, unspecified cause, unspecified class: Secondary | ICD-10-CM | POA: Diagnosis present

## 2019-05-20 DIAGNOSIS — Z79899 Other long term (current) drug therapy: Secondary | ICD-10-CM

## 2019-05-20 DIAGNOSIS — M4316 Spondylolisthesis, lumbar region: Secondary | ICD-10-CM | POA: Diagnosis not present

## 2019-05-20 DIAGNOSIS — Z419 Encounter for procedure for purposes other than remedying health state, unspecified: Secondary | ICD-10-CM

## 2019-05-20 HISTORY — PX: LUMBAR FUSION: SHX111

## 2019-05-20 LAB — GLUCOSE, CAPILLARY
Glucose-Capillary: 112 mg/dL — ABNORMAL HIGH (ref 70–99)
Glucose-Capillary: 138 mg/dL — ABNORMAL HIGH (ref 70–99)

## 2019-05-20 SURGERY — POSTERIOR LUMBAR FUSION 1 LEVEL
Anesthesia: General | Site: Spine Lumbar

## 2019-05-20 MED ORDER — PHENYLEPHRINE 40 MCG/ML (10ML) SYRINGE FOR IV PUSH (FOR BLOOD PRESSURE SUPPORT)
PREFILLED_SYRINGE | INTRAVENOUS | Status: AC
  Filled 2019-05-20: qty 10

## 2019-05-20 MED ORDER — ROCURONIUM BROMIDE 50 MG/5ML IV SOSY
PREFILLED_SYRINGE | INTRAVENOUS | Status: DC | PRN
  Administered 2019-05-20: 10 mg via INTRAVENOUS
  Administered 2019-05-20: 50 mg via INTRAVENOUS
  Administered 2019-05-20 (×2): 10 mg via INTRAVENOUS

## 2019-05-20 MED ORDER — FENTANYL CITRATE (PF) 250 MCG/5ML IJ SOLN
INTRAMUSCULAR | Status: AC
  Filled 2019-05-20: qty 5

## 2019-05-20 MED ORDER — MIDAZOLAM HCL 5 MG/5ML IJ SOLN
INTRAMUSCULAR | Status: DC | PRN
  Administered 2019-05-20: 2 mg via INTRAVENOUS

## 2019-05-20 MED ORDER — ATORVASTATIN CALCIUM 10 MG PO TABS
20.0000 mg | ORAL_TABLET | Freq: Every day | ORAL | Status: DC
  Administered 2019-05-21 – 2019-05-22 (×2): 20 mg via ORAL
  Filled 2019-05-20 (×2): qty 2

## 2019-05-20 MED ORDER — MENTHOL 3 MG MT LOZG: 1.0000 | LOZENGE | OROMUCOSAL | Status: DC | PRN

## 2019-05-20 MED ORDER — FENTANYL CITRATE (PF) 100 MCG/2ML IJ SOLN
25.0000 ug | INTRAMUSCULAR | Status: DC | PRN
  Administered 2019-05-20 (×2): 50 ug via INTRAVENOUS

## 2019-05-20 MED ORDER — ACETAMINOPHEN 325 MG PO TABS
650.0000 mg | ORAL_TABLET | ORAL | Status: DC | PRN
  Administered 2019-05-21 – 2019-05-22 (×3): 650 mg via ORAL
  Filled 2019-05-20 (×3): qty 2

## 2019-05-20 MED ORDER — SCOPOLAMINE 1 MG/3DAYS TD PT72: MEDICATED_PATCH | TRANSDERMAL | Status: DC | PRN

## 2019-05-20 MED ORDER — STERILE WATER FOR IRRIGATION IR SOLN
Status: DC | PRN
  Administered 2019-05-20: 1000 mL

## 2019-05-20 MED ORDER — DOCUSATE SODIUM 100 MG PO CAPS
100.0000 mg | ORAL_CAPSULE | Freq: Two times a day (BID) | ORAL | Status: DC
  Administered 2019-05-20 – 2019-05-22 (×3): 100 mg via ORAL
  Filled 2019-05-20 (×3): qty 1

## 2019-05-20 MED ORDER — HYDROMORPHONE HCL 1 MG/ML IJ SOLN: 0.5000 mg | INTRAMUSCULAR | Status: DC | PRN

## 2019-05-20 MED ORDER — CEFAZOLIN SODIUM-DEXTROSE 1-4 GM/50ML-% IV SOLN
1.0000 g | Freq: Three times a day (TID) | INTRAVENOUS | Status: AC
  Administered 2019-05-21: 1 g via INTRAVENOUS
  Filled 2019-05-20 (×2): qty 50

## 2019-05-20 MED ORDER — SUCCINYLCHOLINE CHLORIDE 200 MG/10ML IV SOSY
PREFILLED_SYRINGE | INTRAVENOUS | Status: AC
  Filled 2019-05-20: qty 10

## 2019-05-20 MED ORDER — ONDANSETRON HCL 4 MG/2ML IJ SOLN: 4.0000 mg | Freq: Once | INTRAMUSCULAR | Status: DC | PRN

## 2019-05-20 MED ORDER — CEFAZOLIN SODIUM-DEXTROSE 2-4 GM/100ML-% IV SOLN
INTRAVENOUS | Status: AC
  Filled 2019-05-20: qty 100

## 2019-05-20 MED ORDER — FENTANYL CITRATE (PF) 100 MCG/2ML IJ SOLN
INTRAMUSCULAR | Status: AC
  Administered 2019-05-20: 50 ug via INTRAVENOUS
  Filled 2019-05-20: qty 2

## 2019-05-20 MED ORDER — OXYCODONE HCL 5 MG PO TABS: 5.0000 mg | ORAL_TABLET | Freq: Once | ORAL | Status: DC | PRN

## 2019-05-20 MED ORDER — METHOCARBAMOL 500 MG PO TABS
500.0000 mg | ORAL_TABLET | Freq: Four times a day (QID) | ORAL | Status: DC | PRN
  Administered 2019-05-20 – 2019-05-22 (×5): 500 mg via ORAL
  Filled 2019-05-20 (×4): qty 1

## 2019-05-20 MED ORDER — ONDANSETRON HCL 4 MG/2ML IJ SOLN
4.0000 mg | Freq: Four times a day (QID) | INTRAMUSCULAR | Status: DC | PRN
  Administered 2019-05-21: 4 mg via INTRAVENOUS
  Filled 2019-05-20 (×2): qty 2

## 2019-05-20 MED ORDER — MIDAZOLAM HCL 2 MG/2ML IJ SOLN
INTRAMUSCULAR | Status: AC
  Filled 2019-05-20: qty 2

## 2019-05-20 MED ORDER — FENTANYL CITRATE (PF) 100 MCG/2ML IJ SOLN
INTRAMUSCULAR | Status: DC | PRN
  Administered 2019-05-20 (×5): 50 ug via INTRAVENOUS

## 2019-05-20 MED ORDER — LACTATED RINGERS IV SOLN
INTRAVENOUS | Status: DC | PRN
  Administered 2019-05-20 (×3): via INTRAVENOUS

## 2019-05-20 MED ORDER — PROPOFOL 10 MG/ML IV BOLUS
INTRAVENOUS | Status: DC | PRN
  Administered 2019-05-20: 170 mg via INTRAVENOUS

## 2019-05-20 MED ORDER — METHOCARBAMOL 1000 MG/10ML IJ SOLN
500.0000 mg | Freq: Four times a day (QID) | INTRAVENOUS | Status: DC | PRN
  Filled 2019-05-20: qty 5

## 2019-05-20 MED ORDER — SUGAMMADEX SODIUM 200 MG/2ML IV SOLN
INTRAVENOUS | Status: DC | PRN
  Administered 2019-05-20: 200 mg via INTRAVENOUS

## 2019-05-20 MED ORDER — SODIUM CHLORIDE 0.9 % IV SOLN
250.0000 mL | INTRAVENOUS | Status: DC
  Administered 2019-05-20: 250 mL via INTRAVENOUS

## 2019-05-20 MED ORDER — SCOPOLAMINE 1 MG/3DAYS TD PT72
MEDICATED_PATCH | TRANSDERMAL | Status: DC | PRN
  Administered 2019-05-20: 1 via TRANSDERMAL

## 2019-05-20 MED ORDER — METHOCARBAMOL 500 MG PO TABS
ORAL_TABLET | ORAL | Status: AC
  Administered 2019-05-20: 500 mg via ORAL
  Filled 2019-05-20: qty 1

## 2019-05-20 MED ORDER — ONDANSETRON HCL 4 MG/2ML IJ SOLN
INTRAMUSCULAR | Status: DC | PRN
  Administered 2019-05-20: 4 mg via INTRAVENOUS

## 2019-05-20 MED ORDER — LIDOCAINE 2% (20 MG/ML) 5 ML SYRINGE
INTRAMUSCULAR | Status: AC
  Filled 2019-05-20: qty 5

## 2019-05-20 MED ORDER — SODIUM CHLORIDE 0.9% FLUSH
3.0000 mL | Freq: Two times a day (BID) | INTRAVENOUS | Status: DC
  Administered 2019-05-20 – 2019-05-21 (×3): 3 mL via INTRAVENOUS

## 2019-05-20 MED ORDER — ALBUMIN HUMAN 5 % IV SOLN
INTRAVENOUS | Status: DC | PRN
  Administered 2019-05-20: 11:00:00 via INTRAVENOUS

## 2019-05-20 MED ORDER — INSULIN ASPART 100 UNIT/ML ~~LOC~~ SOLN
0.0000 [IU] | Freq: Three times a day (TID) | SUBCUTANEOUS | Status: DC
  Administered 2019-05-21: 2 [IU] via SUBCUTANEOUS

## 2019-05-20 MED ORDER — 0.9 % SODIUM CHLORIDE (POUR BTL) OPTIME
TOPICAL | Status: DC | PRN
  Administered 2019-05-20 (×2): 1000 mL

## 2019-05-20 MED ORDER — ACETAMINOPHEN 650 MG RE SUPP: 650.0000 mg | RECTAL | Status: DC | PRN

## 2019-05-20 MED ORDER — EPHEDRINE SULFATE 50 MG/ML IJ SOLN
INTRAMUSCULAR | Status: DC | PRN
  Administered 2019-05-20 (×4): 10 mg via INTRAVENOUS
  Administered 2019-05-20: 5 mg via INTRAVENOUS

## 2019-05-20 MED ORDER — DEXAMETHASONE SODIUM PHOSPHATE 10 MG/ML IJ SOLN
INTRAMUSCULAR | Status: DC | PRN
  Administered 2019-05-20: 10 mg via INTRAVENOUS

## 2019-05-20 MED ORDER — OXYCODONE HCL 5 MG/5ML PO SOLN: 5.0000 mg | Freq: Once | ORAL | Status: DC | PRN

## 2019-05-20 MED ORDER — ALBUTEROL SULFATE (2.5 MG/3ML) 0.083% IN NEBU: 2.5000 mg | INHALATION_SOLUTION | Freq: Four times a day (QID) | RESPIRATORY_TRACT | Status: DC | PRN

## 2019-05-20 MED ORDER — OXYCODONE HCL 5 MG PO TABS
ORAL_TABLET | ORAL | Status: AC
  Administered 2019-05-20: 10 mg via ORAL
  Filled 2019-05-20: qty 2

## 2019-05-20 MED ORDER — THROMBIN 20000 UNITS EX KIT
PACK | CUTANEOUS | Status: AC
  Filled 2019-05-20: qty 1

## 2019-05-20 MED ORDER — CHLORHEXIDINE GLUCONATE 4 % EX LIQD: 60.0000 mL | Freq: Once | CUTANEOUS | Status: DC

## 2019-05-20 MED ORDER — PHENOL 1.4 % MT LIQD: 1.0000 | OROMUCOSAL | Status: DC | PRN

## 2019-05-20 MED ORDER — BUPIVACAINE HCL (PF) 0.5 % IJ SOLN
INTRAMUSCULAR | Status: DC | PRN
  Administered 2019-05-20: 20 mL

## 2019-05-20 MED ORDER — ONDANSETRON HCL 4 MG/2ML IJ SOLN
INTRAMUSCULAR | Status: AC
  Filled 2019-05-20: qty 2

## 2019-05-20 MED ORDER — LIDOCAINE 2% (20 MG/ML) 5 ML SYRINGE
INTRAMUSCULAR | Status: DC | PRN
  Administered 2019-05-20: 60 mg via INTRAVENOUS

## 2019-05-20 MED ORDER — EPHEDRINE 5 MG/ML INJ
INTRAVENOUS | Status: AC
  Filled 2019-05-20: qty 10

## 2019-05-20 MED ORDER — SODIUM CHLORIDE 0.9% FLUSH: 3.0000 mL | INTRAVENOUS | Status: DC | PRN

## 2019-05-20 MED ORDER — PROPOFOL 10 MG/ML IV BOLUS
INTRAVENOUS | Status: AC
  Filled 2019-05-20: qty 40

## 2019-05-20 MED ORDER — ONDANSETRON HCL 4 MG PO TABS
4.0000 mg | ORAL_TABLET | Freq: Four times a day (QID) | ORAL | Status: DC | PRN
  Administered 2019-05-22: 4 mg via ORAL
  Filled 2019-05-20: qty 1

## 2019-05-20 MED ORDER — CEFAZOLIN SODIUM-DEXTROSE 2-4 GM/100ML-% IV SOLN
2.0000 g | INTRAVENOUS | Status: AC
  Administered 2019-05-20: 2 g via INTRAVENOUS

## 2019-05-20 MED ORDER — BUPIVACAINE HCL (PF) 0.5 % IJ SOLN
INTRAMUSCULAR | Status: AC
  Filled 2019-05-20: qty 30

## 2019-05-20 MED ORDER — METFORMIN HCL 500 MG PO TABS
500.0000 mg | ORAL_TABLET | Freq: Every day | ORAL | Status: DC
  Administered 2019-05-21 – 2019-05-22 (×2): 500 mg via ORAL
  Filled 2019-05-20 (×2): qty 1

## 2019-05-20 MED ORDER — DEXAMETHASONE SODIUM PHOSPHATE 10 MG/ML IJ SOLN
INTRAMUSCULAR | Status: AC
  Filled 2019-05-20: qty 1

## 2019-05-20 MED ORDER — HEMOSTATIC AGENTS (NO CHARGE) OPTIME
TOPICAL | Status: DC | PRN
  Administered 2019-05-20: 1 via TOPICAL

## 2019-05-20 MED ORDER — POLYETHYLENE GLYCOL 3350 17 G PO PACK
17.0000 g | PACK | Freq: Every day | ORAL | Status: DC | PRN
  Administered 2019-05-22: 17 g via ORAL
  Filled 2019-05-20: qty 1

## 2019-05-20 MED ORDER — SUCCINYLCHOLINE CHLORIDE 20 MG/ML IJ SOLN
INTRAMUSCULAR | Status: DC | PRN
  Administered 2019-05-20: 140 mg via INTRAVENOUS

## 2019-05-20 MED ORDER — ROCURONIUM BROMIDE 10 MG/ML (PF) SYRINGE
PREFILLED_SYRINGE | INTRAVENOUS | Status: AC
  Filled 2019-05-20: qty 10

## 2019-05-20 MED ORDER — PHENYLEPHRINE HCL (PRESSORS) 10 MG/ML IV SOLN
INTRAVENOUS | Status: DC | PRN
  Administered 2019-05-20 (×2): 80 ug via INTRAVENOUS
  Administered 2019-05-20 (×2): 40 ug via INTRAVENOUS

## 2019-05-20 MED ORDER — OXYCODONE HCL 5 MG PO TABS
5.0000 mg | ORAL_TABLET | ORAL | Status: DC | PRN
  Administered 2019-05-20 – 2019-05-21 (×4): 10 mg via ORAL
  Filled 2019-05-20 (×3): qty 2

## 2019-05-20 MED ORDER — AMLODIPINE BESYLATE 5 MG PO TABS
5.0000 mg | ORAL_TABLET | Freq: Every day | ORAL | Status: DC
  Administered 2019-05-21 – 2019-05-22 (×2): 5 mg via ORAL
  Filled 2019-05-20 (×2): qty 1

## 2019-05-20 MED ORDER — SODIUM CHLORIDE 0.9 % IV SOLN
INTRAVENOUS | Status: DC
  Administered 2019-05-20: 13:00:00 via INTRAVENOUS

## 2019-05-20 SURGICAL SUPPLY — 73 items
BLADE CLIPPER SURG (BLADE) IMPLANT
BUR ROUND FLUTED 4 SOFT TCH (BURR) ×2 IMPLANT
CABLE BIPOLOR RESECTION CORD (MISCELLANEOUS) ×2 IMPLANT
CAP SPINAL LOCKING TI (Cap) ×4 IMPLANT
CLSR STERI-STRIP ANTIMIC 1/2X4 (GAUZE/BANDAGES/DRESSINGS) ×2 IMPLANT
COVER BACK TABLE 80X110 HD (DRAPES) ×2 IMPLANT
COVER SURGICAL LIGHT HANDLE (MISCELLANEOUS) ×2 IMPLANT
COVER WAND RF STERILE (DRAPES) ×2 IMPLANT
DERMABOND ADVANCED (GAUZE/BANDAGES/DRESSINGS) ×1
DERMABOND ADVANCED .7 DNX12 (GAUZE/BANDAGES/DRESSINGS) ×1 IMPLANT
DRAPE C-ARM 42X72 X-RAY (DRAPES) ×2 IMPLANT
DRAPE C-ARMOR (DRAPES) ×1 IMPLANT
DRAPE INCISE IOBAN 66X45 STRL (DRAPES) ×1 IMPLANT
DRAPE LAPAROTOMY T 102X78X121 (DRAPES) ×2 IMPLANT
DRAPE MICROSCOPE LEICA (MISCELLANEOUS) ×2 IMPLANT
DRAPE SURG 17X23 STRL (DRAPES) ×6 IMPLANT
DRSG EMULSION OIL 3X3 NADH (GAUZE/BANDAGES/DRESSINGS) ×2 IMPLANT
DRSG MEPILEX BORDER 4X4 (GAUZE/BANDAGES/DRESSINGS) ×2 IMPLANT
DRSG MEPILEX BORDER 4X8 (GAUZE/BANDAGES/DRESSINGS) ×2 IMPLANT
DRSG PAD ABDOMINAL 8X10 ST (GAUZE/BANDAGES/DRESSINGS) ×4 IMPLANT
DURAPREP 26ML APPLICATOR (WOUND CARE) ×2 IMPLANT
ELECT BLADE 4.0 EZ CLEAN MEGAD (MISCELLANEOUS) ×2
ELECT CAUTERY BLADE 6.4 (BLADE) ×2 IMPLANT
ELECT REM PT RETURN 9FT ADLT (ELECTROSURGICAL) ×2
ELECTRODE BLDE 4.0 EZ CLN MEGD (MISCELLANEOUS) ×1 IMPLANT
ELECTRODE REM PT RTRN 9FT ADLT (ELECTROSURGICAL) ×1 IMPLANT
EVACUATOR 1/8 PVC DRAIN (DRAIN) ×2 IMPLANT
GLOVE BIOGEL PI IND STRL 8 (GLOVE) ×2 IMPLANT
GLOVE BIOGEL PI INDICATOR 8 (GLOVE) ×2
GLOVE ORTHO TXT STRL SZ7.5 (GLOVE) ×4 IMPLANT
GLOVE SURG SS PI 7.0 STRL IVOR (GLOVE) ×1 IMPLANT
GOWN STRL REUS W/ TWL LRG LVL3 (GOWN DISPOSABLE) ×1 IMPLANT
GOWN STRL REUS W/ TWL XL LVL3 (GOWN DISPOSABLE) ×1 IMPLANT
GOWN STRL REUS W/TWL 2XL LVL3 (GOWN DISPOSABLE) ×2 IMPLANT
GOWN STRL REUS W/TWL LRG LVL3 (GOWN DISPOSABLE) ×1
GOWN STRL REUS W/TWL XL LVL3 (GOWN DISPOSABLE) ×2
HEMOSTAT SURGICEL 2X14 (HEMOSTASIS) IMPLANT
KIT BASIN OR (CUSTOM PROCEDURE TRAY) ×2 IMPLANT
KIT POSITION SURG JACKSON T1 (MISCELLANEOUS) IMPLANT
KIT TURNOVER KIT B (KITS) ×2 IMPLANT
MANIFOLD NEPTUNE II (INSTRUMENTS) ×2 IMPLANT
NDL SPNL 18GX3.5 QUINCKE PK (NEEDLE) IMPLANT
NEEDLE SPNL 18GX3.5 QUINCKE PK (NEEDLE) ×2 IMPLANT
NS IRRIG 1000ML POUR BTL (IV SOLUTION) ×2 IMPLANT
PACK LAMINECTOMY ORTHO (CUSTOM PROCEDURE TRAY) ×2 IMPLANT
PAD ARMBOARD 7.5X6 YLW CONV (MISCELLANEOUS) ×4 IMPLANT
PATTIES SURGICAL .5 X.5 (GAUZE/BANDAGES/DRESSINGS) IMPLANT
PATTIES SURGICAL .75X.75 (GAUZE/BANDAGES/DRESSINGS) ×2 IMPLANT
ROD 40MM (Rod) ×2 IMPLANT
ROD SPNL CVD 40X5.5XHRD NS (Rod) IMPLANT
SCREW MATRIX MIS 6.0X40MM (Screw) ×4 IMPLANT
SLEEVE SURGEON STRL (DRAPES) ×1 IMPLANT
SPACER TPAL 7MMX10X28MM (Cage) ×1 IMPLANT
SPONGE LAP 18X18 RF (DISPOSABLE) ×1 IMPLANT
SPONGE LAP 4X18 RFD (DISPOSABLE) ×5 IMPLANT
SPONGE SURGIFOAM ABS GEL 100 (HEMOSTASIS) IMPLANT
STAPLER SKIN PROX 35W (STAPLE) ×1 IMPLANT
STAPLER VISISTAT 35W (STAPLE) IMPLANT
SURGIFLO W/THROMBIN 8M KIT (HEMOSTASIS) ×2 IMPLANT
SUT BONE WAX W31G (SUTURE) ×2 IMPLANT
SUT VIC AB 0 CT1 27 (SUTURE) ×2
SUT VIC AB 0 CT1 27XBRD ANBCTR (SUTURE) IMPLANT
SUT VIC AB 1 CTX 36 (SUTURE) ×2
SUT VIC AB 1 CTX36XBRD ANBCTR (SUTURE) ×1 IMPLANT
SUT VIC AB 2-0 CT1 27 (SUTURE) ×1
SUT VIC AB 2-0 CT1 TAPERPNT 27 (SUTURE) ×1 IMPLANT
SUT VIC AB 3-0 X1 27 (SUTURE) IMPLANT
TAP CANN 5MM (TAP) ×1 IMPLANT
TOWEL GREEN STERILE (TOWEL DISPOSABLE) ×2 IMPLANT
TOWEL GREEN STERILE FF (TOWEL DISPOSABLE) ×2 IMPLANT
TRAY FOLEY MTR SLVR 16FR STAT (SET/KITS/TRAYS/PACK) ×2 IMPLANT
WATER STERILE IRR 1000ML POUR (IV SOLUTION) ×2 IMPLANT
YANKAUER SUCT BULB TIP NO VENT (SUCTIONS) ×2 IMPLANT

## 2019-05-20 NOTE — Evaluation (Signed)
Physical Therapy Evaluation Patient Details Name: Sandra Lozano MRN: [id number] DOB: 11-12-1961 Today's Date: 05/20/2019   History of Present Illness  Pt is a 58 y/o female s/p L4-5 TLIF. PMH inclused pre-diabetes adn HTN.   Clinical Impression  Patient is s/p above surgery resulting in the deficits listed below (see PT Problem List). Pt limited this session secondary to nausea/vomiting. Pt requiring min A to stand at EOB. Educated about back precautions following surgery.  Anticipate pt will progress well once feeling better. Patient will benefit from skilled PT to increase their independence and safety with mobility (while adhering to their precautions) to allow discharge to the venue listed below.     Follow Up Recommendations Supervision for mobility/OOB;Home health PT    Equipment Recommendations  Rolling walker with 5" wheels    Recommendations for Other Services       Precautions / Restrictions Precautions Precautions: Back Precaution Booklet Issued: Yes (comment) Precaution Comments: Reviewed back precautions with pt.  Required Braces or Orthoses: Spinal Brace Spinal Brace: Lumbar corset Restrictions Weight Bearing Restrictions: No      Mobility  Bed Mobility Overal bed mobility: Needs Assistance Bed Mobility: Rolling;Sidelying to Sit;Sit to Sidelying Rolling: Min assist Sidelying to sit: Min assist     Sit to sidelying: Min assist General bed mobility comments: Min A for rolling assist and for LE and trunk assist during bed mobility. Cues for sequencing using log roll technique and required use of rails.   Transfers Overall transfer level: Needs assistance Equipment used: 1 person hand held assist Transfers: Sit to/from Stand Sit to Stand: Min assist;From elevated surface         General transfer comment: Min A from elevated surface to stand. Increased time required secondary to pain. Pt reporting increased nausea and dizziness in standing, therefore returned  to seated position.   Ambulation/Gait             General Gait Details: unable   Stairs            Wheelchair Mobility    Modified Rankin (Stroke Patients Only)       Balance Overall balance assessment: Needs assistance Sitting-balance support: No upper extremity supported;Feet supported Sitting balance-Leahy Scale: Fair     Standing balance support: Single extremity supported;During functional activity Standing balance-Leahy Scale: Poor Standing balance comment: Reliant on at least 1 UE support                              Pertinent Vitals/Pain Pain Assessment: Faces Faces Pain Scale: Hurts even more Pain Location: back Pain Descriptors / Indicators: Aching;Operative site guarding Pain Intervention(s): Limited activity within patient's tolerance;Monitored during session;Repositioned    Home Living Family/patient expects to be discharged to:: Private residence Living Arrangements: Spouse/significant other Available Help at Discharge: Family;Available 24 hours/day Type of Home: House Home Access: Stairs to enter Entrance Stairs-Rails: Right;Left;Can reach both Entrance Stairs-Number of Steps: flight Home Layout: One level Home Equipment: Cane - single point;Toilet riser      Prior Function Level of Independence: Independent with assistive device(s)         Comments: Reports occasional use of cane      Hand Dominance        Extremity/Trunk Assessment   Upper Extremity Assessment Upper Extremity Assessment: Defer to OT evaluation    Lower Extremity Assessment Lower Extremity Assessment: LLE deficits/detail LLE Deficits / Details: LLE pain and weakness at baseline  Cervical / Trunk Assessment Cervical / Trunk Assessment: Other exceptions Cervical / Trunk Exceptions: s/p lumbar surgery.   Communication   Communication: No difficulties  Cognition Arousal/Alertness: Awake/alert Behavior During Therapy: WFL for tasks  assessed/performed Overall Cognitive Status: Within Functional Limits for tasks assessed                                        General Comments      Exercises     Assessment/Plan    PT Assessment Patient needs continued PT services  PT Problem List Decreased strength;Decreased balance;Decreased mobility;Decreased knowledge of use of DME;Decreased knowledge of precautions;Decreased activity tolerance;Pain       PT Treatment Interventions DME instruction;Gait training;Stair training;Therapeutic activities;Therapeutic exercise;Functional mobility training;Patient/family education;Balance training    PT Goals (Current goals can be found in the Care Plan section)  Acute Rehab PT Goals Patient Stated Goal: to feel better and go home PT Goal Formulation: With patient Time For Goal Achievement: 06/03/19 Potential to Achieve Goals: Good    Frequency Min 5X/week   Barriers to discharge        Co-evaluation               AM-PAC PT "6 Clicks" Mobility  Outcome Measure Help needed turning from your back to your side while in a flat bed without using bedrails?: A Little Help needed moving from lying on your back to sitting on the side of a flat bed without using bedrails?: A Little Help needed moving to and from a bed to a chair (including a wheelchair)?: A Little Help needed standing up from a chair using your arms (e.g., wheelchair or bedside chair)?: A Little Help needed to walk in hospital room?: A Lot Help needed climbing 3-5 steps with a railing? : A Lot 6 Click Score: 16    End of Session Equipment Utilized During Treatment: Gait belt Activity Tolerance: Treatment limited secondary to medical complications (Comment)(nausea/vomiting) Patient left: in bed;with call bell/phone within reach;with bed alarm set;with nursing/sitter in room Nurse Communication: Mobility status;Other (comment)(nausea/vomiting; Pt IV out ) PT Visit Diagnosis: Other  abnormalities of gait and mobility (R26.89);Pain Pain - part of body: (back)    Time: 1442-1510 PT Time Calculation (min) (ACUTE ONLY): 28 min   Charges:   PT Evaluation $PT Eval Low Complexity: 1 Low PT Treatments $Therapeutic Activity: 8-22 mins        Gladys DammeBrittany Aislinn Feliz, PT, DPT  Acute Rehabilitation Services  Pager: [phone] Office: [phone]   Lehman PromBrittany S Axie Hayne 05/20/2019, 3:42 PM

## 2019-05-20 NOTE — Transfer of Care (Signed)
Immediate Anesthesia Transfer of Care Note  Patient: Justiss Ginsberg  Procedure(s) Performed: L4-5 GILL PROCEDURE, LEFT TRANSFORAMINAL LUMBAR INTERBODY FUSION, PEDICLE INSTRUMENTATION, BILATERAL FUSION (N/A Spine Lumbar)  Patient Location: PACU  Anesthesia Type:General  Level of Consciousness: patient cooperative and responds to stimulation  Airway & Oxygen Therapy: Patient Spontanous Breathing and Patient connected to nasal cannula oxygen  Post-op Assessment: Report given to RN and Post -op Vital signs reviewed and stable  Post vital signs: Reviewed and stable  Last Vitals:  Vitals Value Taken Time  BP 156/68 05/20/2019 12:08 PM  Temp 36.1 C 05/20/2019 12:08 PM  Pulse 88 05/20/2019 12:09 PM  Resp 16 05/20/2019 12:09 PM  SpO2 99 % 05/20/2019 12:09 PM  Vitals shown include unvalidated device data.  Last Pain:  Vitals:   05/20/19 1208  TempSrc:   PainSc: (P) Asleep      Patients Stated Pain Goal: 3 (05/20/19 9396)  Complications: No apparent anesthesia complications

## 2019-05-20 NOTE — Interval H&P Note (Signed)
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  The various methods of treatment have been discussed with the patient and family. After consideration of risks, benefits and other options for treatment, the patient has consented to  Procedure(s): L4-5 GILL PROCEDURE, LEFT TRANSFORAMINAL LUMBAR INTERBODY FUSION, PEDICLE INSTRUMENTATION, BILATERAL FUSION (N/A) as a surgical intervention.  The patient's history has been reviewed, patient examined, no change in status, stable for surgery.  I have reviewed the patient's chart and labs.  Questions were answered to the patient's satisfaction.     Eldred Manges

## 2019-05-20 NOTE — Anesthesia Preprocedure Evaluation (Signed)
Anesthesia Evaluation  Patient identified by MRN, date of birth, ID band Patient awake    Reviewed: Allergy & Precautions, NPO status , Patient's Chart, lab work & pertinent test results  Airway Mallampati: II  TM Distance: >3 FB Neck ROM: Full    Dental  (+) Edentulous Upper, Partial Lower, Dental Advisory Given   Pulmonary former smoker,    breath sounds clear to auscultation       Cardiovascular hypertension,  Rhythm:Regular Rate:Normal     Neuro/Psych    GI/Hepatic   Endo/Other    Renal/GU      Musculoskeletal   Abdominal   Peds  Hematology   Anesthesia Other Findings   Reproductive/Obstetrics                             Anesthesia Physical Anesthesia Plan  ASA: III  Anesthesia Plan: General   Post-op Pain Management:    Induction: Intravenous  PONV Risk Score and Plan: Ondansetron and Dexamethasone  Airway Management Planned: Oral ETT  Additional Equipment:   Intra-op Plan:   Post-operative Plan: Extubation in OR  Informed Consent: I have reviewed the patients History and Physical, chart, labs and discussed the procedure including the risks, benefits and alternatives for the proposed anesthesia with the patient or authorized representative who has indicated his/her understanding and acceptance.     Dental advisory given  Plan Discussed with: CRNA and Anesthesiologist  Anesthesia Plan Comments:         Anesthesia Quick Evaluation

## 2019-05-20 NOTE — Progress Notes (Signed)
Post op check:  Pre-op leg pain gone. Ankle DF,PF, 5/5 bilateral. Sensation normal. She is happy with the pain relief.

## 2019-05-20 NOTE — H&P (Signed)
Sandra Lozano is an 58 y.o. female.   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  When standing she gets weakness in her legs.  She has had previous treatment with anti-inflammatories muscle relaxants topical creams epidural steroid injections prednisone Dosepak all without relief.  MRI scan lumbar showed 7 mm anterolisthesis with severe subarticular foraminal stenosis at L4-5 and severe central stenosis. As I stated before patient needs a single level decompression fusion operation for stenosis and instability.   stenosis.  Remaining levels were normal.  Past Medical History:  Diagnosis Date  . Arthritis    right knee, lower back  . Dyspnea    very rare -tx with albuterol neb sol if needed  . Hyperlipidemia   . Hypertension   . Pre-diabetes    per patient - does not check blood sugar  . SVD (spontaneous vaginal delivery)    x 5 - only 2 living, 1 stillborn and 1 premature at 7 months demise,1 child deceased  . Wears dentures    upper and lower    Past Surgical History:  Procedure Laterality Date  . TUBAL LIGATION      History reviewed. No pertinent family history. Social History:  reports that she quit smoking about 4 years ago. Her smoking use included cigarettes. She has a 30.00 pack-year smoking history. She has never used smokeless tobacco. She reports previous alcohol use. She reports current drug use. Drug: Marijuana.  Allergies: No Known Allergies  Medications Prior to Admission  Medication Sig Dispense Refill  . amLODipine (NORVASC) 5 MG tablet Take 5 mg by mouth daily.    Marland Kitchen atorvastatin (LIPITOR) 20 MG  tablet Take 1 tablet (20 mg total) by mouth daily. 60 tablet 2  . meloxicam (MOBIC) 7.5 MG tablet Take 1 tablet (7.5 mg total) by mouth 2 (two) times daily. 60 tablet 0  . Menthol, Topical Analgesic, (BENGAY EX) Apply 1 application topically daily as needed (arthritis pain).    . metFORMIN (GLUCOPHAGE) 500 MG tablet Take 1 tablet (500 mg total) by mouth daily with breakfast. 60 tablet 2  . acetaminophen-codeine (TYLENOL #3) 300-30 MG tablet Take 1 tablet by mouth every 6 (six) hours as needed for moderate pain. 20 tablet 0  . albuterol (PROVENTIL) (2.5 MG/3ML) 0.083% nebulizer solution Take 2.5 mg by nebulization every 6 (six) hours as needed for wheezing or shortness of breath.    . diclofenac sodium (VOLTAREN) 1 % GEL Apply 4 g topically 4 (four) times daily. (Patient not taking: Reported on 05/17/2019) 100 g 1  . lisinopril-hydrochlorothiazide (ZESTORETIC) 20-12.5 MG tablet Take 1 tablet by mouth daily. (Patient not taking: Reported on 05/17/2019) 90 tablet 3  . methocarbamol (ROBAXIN-750) 750 MG tablet Take 1 tablet (750 mg total) by mouth at bedtime. (Patient not taking: Reported on 05/17/2019) 15 tablet 0  . predniSONE (DELTASONE) 10 MG tablet Take 2 tablets (20 mg total) by mouth daily with breakfast. (Patient not taking: Reported on 03/01/2019) 60 tablet 0    Results for orders placed or performed during the hospital encounter of 05/20/19 (from the past 48 hour(s))  Glucose, capillary     Status: Abnormal  Collection Time: 05/20/19  5:48 AM  Result Value Ref Range   Glucose-Capillary 112 (H) 70 - 99 mg/dL   Comment 1 Notify RN    No results found.  ROS previous right knee ACL tear.  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  Spinal stenosis instability L4-5.  Metformin for diabetes.  Lipitor for hypercholesterolemia.  Positive hypertension.  Negative for MI and stroke.  No previous surgeries.  Blood pressure (!) 186/93, pulse 63, temperature 97.9 F (36.6 C),  temperature source Oral, resp. rate 18, height 5\' 4"  (1.626 m), weight 92.7 kg, SpO2 100 %. Physical Exam  Constitutional: She is oriented to person, place, and time. She appears well-developed and well-nourished.  HENT:  Head: Normocephalic and atraumatic.  Eyes: Pupils are equal, round, and reactive to light. Conjunctivae are normal.  Neck: Normal range of motion. Neck supple. No tracheal deviation present. No thyromegaly present.  Cardiovascular: Normal rate and regular rhythm.  Respiratory: Effort normal and breath sounds normal.  GI: Soft. Bowel sounds are normal.  Musculoskeletal: Normal range of motion.  Neurological: She is alert and oriented to person, place, and time.  Skin: Skin is warm and dry.  Psychiatric: She has a normal mood and affect. Her behavior is normal. Judgment and thought content normal.     Assessment/Plan L4-5 single level decompression fusion for instability spondylolisthesis and neurogenic claudication symptoms.  Risk surgery discussed including potential for revision surgery due to screw malplacement.  Pseudoarthrosis and need for repeat surgery or potentially.  Dural tear infection, DVT and anesthetic complications were discussed.  She understands request we proceed.  Eldred MangesMark C Ervin Hensley, MD 05/20/2019, 7:12 AM

## 2019-05-20 NOTE — Progress Notes (Signed)
IV team at the bedside for IV restart 

## 2019-05-20 NOTE — Interval H&P Note (Signed)
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  The various methods of treatment have been discussed with the patient and family. After consideration of risks, benefits and other options for treatment, the patient has consented to  Procedure(s): L4-5 GILL PROCEDURE, LEFT TRANSFORAMINAL LUMBAR INTERBODY FUSION, PEDICLE INSTRUMENTATION, BILATERAL FUSION (N/A) as a surgical intervention.  The patient's history has been reviewed, patient examined, no change in status, stable for surgery.  I have reviewed the patient's chart and labs.  Questions were answered to the patient's satisfaction.     Jodeen Mclin C Satoya Feeley   

## 2019-05-20 NOTE — Anesthesia Postprocedure Evaluation (Signed)
Anesthesia Post Note  Patient: Sophiagrace Quinley  Procedure(s) Performed: L4-5 GILL PROCEDURE, LEFT TRANSFORAMINAL LUMBAR INTERBODY FUSION, PEDICLE INSTRUMENTATION, BILATERAL FUSION (N/A Spine Lumbar)     Patient location during evaluation: PACU Anesthesia Type: General Level of consciousness: awake and alert Pain management: pain level controlled Vital Signs Assessment: post-procedure vital signs reviewed and stable Respiratory status: spontaneous breathing, nonlabored ventilation, respiratory function stable and patient connected to nasal cannula oxygen Cardiovascular status: blood pressure returned to baseline and stable Postop Assessment: no apparent nausea or vomiting Anesthetic complications: no    Last Vitals:  Vitals:   05/20/19 1240 05/20/19 1255  BP: 116/62   Pulse: 83   Resp: 19   Temp:  (!) 36.1 C  SpO2: 94%     Last Pain:  Vitals:   05/20/19 1355  TempSrc:   PainSc: Asleep                 Gehrig Patras COKER

## 2019-05-20 NOTE — Op Note (Signed)
Preop diagnosis: L4-5 instability with spondylolisthesis, severe foraminal stenosis and severe central spinal stenosis.  Postop diagnosis: Same  Procedure: Gill procedure, L4-5 transforaminal interbody fusion with bilateral intertransverse process fusion(360 fusion) with local bone, 7 mm cage, 1 level pedicle instrumentation, operative microscope.  Surgeon: Annell GreeningMark Tamecca Artiga, MD  Assistant: Zonia KiefJames Owens, PA-C medically necessary and present for the entire procedure  Anesthesia: General plus Marcaine local  EBL: 900 mL  Implants:Depuy matrix 6 x 40 screws x4.  40 mm rods x2.  7 mm banana-shaped T-PAL lordotic cage.  Procedure: After induction general anesthesia preoperative Ancef prophylaxis patient was placed prone on spine frame careful padding positioning arms at 9090 rolled yellow foam pads anterior to the shoulder underneath the ulnar nerve and underneath the humerus.  SCD calf pumpers applied for DVT prophylaxis.  1010 drapes applied x4 back was prepped with DuraPrep sterile skin marker the midline Betadine Steri-Drape and laminectomy sheets and drapes.  Timeout procedure was completed.  Midline incision was made after spinal needle was placed at the L4-5 level based on palpable landmarks and confirmed with sterilely draped C arm using the C armor drape for lateral imaging.  Decompression was performed removing the unstable posterior element performing a complete laminectomy at L4 taking the top portion of L5 where there was compression of the dura.  There was extensive adhesions with hypertrophic ligamentum stuck down to the dura that required microdissection using the operative microscope to gently peel off the dura without tearing the dura.  Bone was removed at the level of the pedicles on both sides and on the left side where she is more symptomatic undercutting of the facet joint free the nerve root out as the facet joint was moved on the left side.  Following laterally out on the left side just  above the pedicle removing the facet and then placing a Penfield 4 in the disc base confirming with repeat spot image that we were at the appropriate correct level.  Lamina spreader was used mobilization with disc spreaders.  Patient had endplate erosive changes and care was taken not to violate the endplate.  This space was tight with patient in prone position she had partial reduction of her unstable level where she had been shifting 6 mm and had grade 1/2 to grade 2 anterolisthesis.  Progressive removal of small amount of disc that was present.  Curettes were used on the endplate care taken not to violate through the endplate.  Bone was meticulously packed after trial sizer with the 7 gave some reduction of the spondylolisthesis and restoration of disc space height opening up the foramina bilaterally.  Bone that is been decorticated from the posterior elements as well as the facet joint was cleaned of all soft tissue and small pieces were packed anterior to the disc followed by packing bone and the cage in the 7 mm banana-shaped cage T-PAL lordotic cage was inserted checking with C arm and once it was inside the disc space completely it was checked under fluoroscopy and then advanced and kicked over to the lateral position where it was in the midline on AP x-ray and kicked anterior to the midportion of the disc with adequate amount of bone anterior to the cage.  There was partial reduction of the spondylolisthesis cage was stable extremely tight.  Next pedicle screws were placed starting on the left side at L5.  All pedicle screws were 6 x 40 mm in length.  Oblique views were obtained and using the [facility] careful  palpation of the medial border of each pedicle and inferior border of the pedicle to make sure that there was no penetration through the pedicle.  All screws had convergence.  Screw on the right side was slightly low in the pedicle but did not violate through the inferior aspect of the pedicle and was  in the vertebral body securely with convergence.  Once this was confirmed rods were placed compressing the left side first after Been Placed.  There Is 2 Mm of Rod Sticking out past the Screw on Each Side and Compression Was Used First on the Left Side While the Screws Were Tightened with 2 Clicks.  Left Side Done First Followed by Right Side.  Final Spot Fluoroscopic Pictures Were Used Confirming Good Position Alignment.  Remaining Portion of Bone Was Then Packed out to the Decorticated Transverse Processes That Been Decorticated with a 4 Mm Bur and Lateral Gutter Packing Both Right and Left Side to Complete Augmentation of the Fusion.  Gutters Were Checked All Patties Were Removed Some Surgi-Flo to Been Used in the Gutters This Was Removed Operative Field Was Dry.  Standard Layered Closure with #1 Vicryl 2-0 Vicryl Subtenons Tissue Skin Closure Postop Dressing and Transfer the Cover Room the Patient Had Intact Dorsiflexion Plantarflexion Strength.

## 2019-05-20 NOTE — Progress Notes (Signed)
Orthopedic Tech Progress Note Patient Details:  Sandra Lozano Mar 11, 1961 [id number] Called to [facility] for an ASPEN LUMBAR BRACE for patient.  Patient ID: Sandra Lozano, female   DOB: 05/17/1961, 58 y.o.   MRN: [id number]   Donald Pore 05/20/2019, 12:42 PM

## 2019-05-20 NOTE — Anesthesia Procedure Notes (Signed)
Procedure Name: Intubation Date/Time: 05/20/2019 7:45 AM Performed by: Fransisca Kaufmann, CRNA Pre-anesthesia Checklist: Patient identified, Emergency Drugs available, Suction available and Patient being monitored Patient Re-evaluated:Patient Re-evaluated prior to induction Oxygen Delivery Method: Circle System Utilized Preoxygenation: Pre-oxygenation with 100% oxygen Induction Type: IV induction Ventilation: Mask ventilation without difficulty Laryngoscope Size: Miller and 2 Grade View: Grade I Tube type: Oral Tube size: 7.0 mm Number of attempts: 1 Airway Equipment and Method: Stylet and Oral airway Placement Confirmation: ETT inserted through vocal cords under direct vision,  positive ETCO2 and breath sounds checked- equal and bilateral Secured at: 22 cm Tube secured with: Tape Dental Injury: Teeth and Oropharynx as per pre-operative assessment

## 2019-05-21 LAB — BASIC METABOLIC PANEL
Anion gap: 11 (ref 5–15)
BUN: 7 mg/dL (ref 6–20)
CO2: 23 mmol/L (ref 22–32)
Calcium: 9 mg/dL (ref 8.9–10.3)
Chloride: 104 mmol/L (ref 98–111)
Creatinine, Ser: 0.62 mg/dL (ref 0.44–1.00)
GFR calc Af Amer: >60 mL/min (ref >60)
GFR calc non Af Amer: >60 mL/min (ref >60)
Glucose, Bld: 143 mg/dL — ABNORMAL HIGH (ref 70–99)
Potassium: 3.8 mmol/L (ref 3.5–5.1)
Sodium: 138 mmol/L (ref 135–145)

## 2019-05-21 LAB — CBC
HCT: 28.1 % — ABNORMAL LOW (ref 36.0–46.0)
Hemoglobin: 9.3 g/dL — ABNORMAL LOW (ref 12.0–15.0)
MCH: 27.8 pg (ref 26.0–34.0)
MCHC: 33.1 g/dL (ref 30.0–36.0)
MCV: 83.9 fL (ref 80.0–100.0)
Platelets: 284 10*3/uL (ref 150–400)
RBC: 3.35 MIL/uL — ABNORMAL LOW (ref 3.87–5.11)
RDW: 12.6 % (ref 11.5–15.5)
WBC: 11.4 10*3/uL — ABNORMAL HIGH (ref 4.0–10.5)
nRBC: 0 % (ref 0.0–0.2)

## 2019-05-21 LAB — GLUCOSE, CAPILLARY
Glucose-Capillary: 122 mg/dL — ABNORMAL HIGH (ref 70–99)
Glucose-Capillary: 104 mg/dL — ABNORMAL HIGH (ref 70–99)
Glucose-Capillary: 157 mg/dL — ABNORMAL HIGH (ref 70–99)
Glucose-Capillary: 135 mg/dL — ABNORMAL HIGH (ref 70–99)

## 2019-05-21 NOTE — Progress Notes (Signed)
   Subjective: 1 Day Post-Op Procedure(s) (LRB): L4-5 GILL PROCEDURE, LEFT TRANSFORAMINAL LUMBAR INTERBODY FUSION, PEDICLE INSTRUMENTATION, BILATERAL FUSION (N/A) Patient reports pain as mild and moderate.    Objective: Vital signs in last 24 hours: Temp:  [97 F (36.1 C)-99.1 F (37.3 C)] 98.8 F (37.1 C) (06/06 0809) Pulse Rate:  [74-98] 74 (06/06 0809) Resp:  [14-19] 19 (06/05 1610) BP: (105-156)/(62-98) 133/72 (06/06 0809) SpO2:  [94 %-100 %] 100 % (06/06 0809)  Intake/Output from previous day: 06/05 0701 - 06/06 0700 In: 4000 [P.O.:600; I.V.:2850; IV Piggyback:550] Out: 2400 [Urine:1325; Blood:1075] Intake/Output this shift: Total I/O In: 240 [P.O.:240] Out: -   Recent Labs    05/18/19 1002 05/21/19 0210  HGB 13.3 9.3*   Recent Labs    05/18/19 1002 05/21/19 0210  WBC 10.5 11.4*  RBC 4.78 3.35*  HCT 41.2 28.1*  PLT 356 284   Recent Labs    05/18/19 1002 05/21/19 0210  NA 139 138  K 3.8 3.8  CL 103 104  CO2 24 23  BUN 15 7  CREATININE 0.60 0.62  GLUCOSE 112* 143*  CALCIUM 10.0 9.0   No results for input(s): LABPT, INR in the last 72 hours.  Neurologically intact Dg Lumbar Spine Complete  Result Date: 05/20/2019 CLINICAL DATA:  Spinal stenosis at L4-5. Bilateral foraminal stenosis at L4-5. EXAM: LUMBAR SPINE - COMPLETE 4+ VIEW COMPARISON:  MRI dated 11/13/2018 FINDINGS: AP and lateral C-arm images demonstrate the patient undergoing interbody and posterior fusion at L4-5. Hardware appears in good position on the available images. 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 IMPRESSION: Interbody and posterior fusion performed at L4-5. Electronically Signed   By: Lorriane Shire M.D.   On: 05/20/2019 11:37   Dg C-arm 1-60 Min  Result Date: 05/20/2019 CLINICAL DATA:  Lumbar spine surgery. EXAM: DG C-ARM 61-120 MIN COMPARISON:  MRI 11/13/2018. FINDINGS: Lumbar spine numbered as per prior MRI. L4-L5 posterior and interbody fusion. Hardware intact. Anatomic alignment.  Four images obtained. 0 minutes 44 seconds fluoroscopy time. IMPRESSION: L4-L5 posterior and interbody fusion. Hardware intact. Anatomic alignment. Electronically Signed   By: Marcello Moores  Register   On: 05/20/2019 11:38    Assessment/Plan: 1 Day Post-Op Procedure(s) (LRB): L4-5 GILL PROCEDURE, LEFT TRANSFORAMINAL LUMBAR INTERBODY FUSION, PEDICLE INSTRUMENTATION, BILATERAL FUSION (N/A) Up with therapy, walking in room. No bowel movement yet. Supp. Given. Plan home tomorrow AM. Working with OT now   Sandra Lozano 05/21/2019, 9:51 AM

## 2019-05-21 NOTE — Plan of Care (Signed)

## 2019-05-21 NOTE — Plan of Care (Signed)
?  Problem: Activity: ?Goal: Risk for activity intolerance will decrease ?Outcome: Progressing ?  ?Problem: Safety: ?Goal: Ability to remain free from injury will improve ?Outcome: Progressing ?  ?Problem: Pain Managment: ?Goal: General experience of comfort will improve ?Outcome: Progressing ?  ?

## 2019-05-21 NOTE — Progress Notes (Signed)
Pt with uneventful night. Back surgical dsg, edges rolled up and small old bloodstain drainage noted. Placed transparent tegaderm on top to reinforce surgical dsg.

## 2019-05-21 NOTE — Evaluation (Signed)
Occupational Therapy Evaluation Patient Details Name: Sandra Lozano MRN: [id number] DOB: May 18, 1961 Today's Date: 05/21/2019    History of Present Illness Pt is a 58 y/o female s/p L4-5 TLIF. PMH inclused pre-diabetes adn HTN.    Clinical Impression   Patient is s/p  L4-5 TLIF surgery resulting in the deficits listed below (see OT Problem List). Patient was educated about precautions and brace. The patient then was able to report and then demonstrate how to don/doff brace. The patient was educated about requiring a shower seat when completing showers with hand held shower hose. Patient requires occasional cues when completing ADLS about precautions but is able to complete in sitting to standing with increase time with AE. The patient will have husband and family support when returning home.       Follow Up Recommendations  No OT follow up;Supervision - Intermittent    Equipment Recommendations  Tub/shower seat    Recommendations for Other Services       Precautions / Restrictions Precautions Precautions: Back Precaution Booklet Issued: No Precaution Comments: Reviewed back precautions with pt.  Required Braces or Orthoses: Spinal Brace Spinal Brace: Lumbar corset Restrictions Weight Bearing Restrictions: No      Mobility Bed Mobility Overal bed mobility: Needs Assistance Bed Mobility: Rolling;Sidelying to Sit;Sit to Sidelying Rolling: Modified independent (Device/Increase time) Sidelying to sit: Modified independent (Device/Increase time)     Sit to sidelying: Modified independent (Device/Increase time) General bed mobility comments: increase time   Transfers Overall transfer level: Needs assistance Equipment used: Rolling walker (2 wheeled) Transfers: Sit to/from Stand Sit to Stand: Supervision         General transfer comment: cues for sequencing     Balance Overall balance assessment: Needs assistance Sitting-balance support: No upper extremity supported;Feet  supported Sitting balance-Leahy Scale: Good     Standing balance support: Single extremity supported;During functional activity Standing balance-Leahy Scale: Fair                             ADL either performed or assessed with clinical judgement   ADL Overall ADL's : Needs assistance/impaired Eating/Feeding: Independent;Sitting   Grooming: Wash/dry hands;Modified independent;Standing   Upper Body Bathing: Modified independent;Sitting   Lower Body Bathing: Supervison/ safety;Cueing for back precautions;Sit to/from stand   Upper Body Dressing : Modified independent;Sitting   Lower Body Dressing: Supervision/safety;Cueing for sequencing;Cueing for back precautions   Toilet Transfer: Supervision/safety;Grab bars   Toileting- Clothing Manipulation and Hygiene: Modified independent;Sit to/from stand   Tub/ Shower Transfer: Tub transfer;Min guard;Shower seat   Functional mobility during ADLs: Rolling walker;Supervision/safety       Vision Baseline Vision/History: No visual deficits Patient Visual Report: Other (comment)(she reports her vision is better now post sx) Vision Assessment?: No apparent visual deficits     Perception Perception Perception Tested?: No   Praxis Praxis Praxis tested?: Within functional limits    Pertinent Vitals/Pain Pain Assessment: 0-10 Pain Score: 1  Pain Location: back Pain Descriptors / Indicators: Aching Pain Intervention(s): Limited activity within patient's tolerance;Monitored during session;Repositioned     Hand Dominance Right   Extremity/Trunk Assessment Upper Extremity Assessment Upper Extremity Assessment: Overall WFL for tasks assessed   Lower Extremity Assessment Lower Extremity Assessment: Defer to PT evaluation   Cervical / Trunk Assessment Cervical / Trunk Assessment: Other exceptions Cervical / Trunk Exceptions: s/p lumbar surgery.    Communication Communication Communication: No difficulties    Cognition Arousal/Alertness: Awake/alert Behavior During Therapy: WFL for tasks  assessed/performed Overall Cognitive Status: Within Functional Limits for tasks assessed                                     General Comments       Exercises     Shoulder Instructions      Home Living Family/patient expects to be discharged to:: Private residence Living Arrangements: Spouse/significant other Available Help at Discharge: Family;Available 24 hours/day Type of Home: House Home Access: Stairs to enter Entergy CorporationEntrance Stairs-Number of Steps: flight Entrance Stairs-Rails: Right;Left;Can reach both Home Layout: One level     Bathroom Shower/Tub: Chief Strategy OfficerTub/shower unit   Bathroom Toilet: Standard Bathroom Accessibility: Yes How Accessible: Accessible via walker Home Equipment: Cane - single point;Toilet riser   Additional Comments:  needs shower seat       Prior Functioning/Environment Level of Independence: Independent with assistive device(s)        Comments: Reports occasional use of cane         OT Problem List: Decreased strength;Decreased safety awareness;Pain      OT Treatment/Interventions:      OT Goals(Current goals can be found in the care plan section) Acute Rehab OT Goals Patient Stated Goal: to go home soon OT Goal Formulation: With patient Time For Goal Achievement: 05/21/19 Potential to Achieve Goals: Good  OT Frequency:     Barriers to D/C:            Co-evaluation              AM-PAC OT "6 Clicks" Daily Activity     Outcome Measure Help from another person eating meals?: None Help from another person taking care of personal grooming?: None Help from another person toileting, which includes using toliet, bedpan, or urinal?: None Help from another person bathing (including washing, rinsing, drying)?: A Little Help from another person to put on and taking off regular upper body clothing?: None Help from another person to put on and  taking off regular lower body clothing?: None 6 Click Score: 23   End of Session Equipment Utilized During Treatment: Gait belt;Rolling walker;Back brace  Activity Tolerance: Patient tolerated treatment well Patient left: in bed(with PT)  OT Visit Diagnosis: Unsteadiness on feet (R26.81);Repeated falls (R29.6);Muscle weakness (generalized) (M62.81);Pain                Time: [id number] OT Time Calculation (min): 27 min Charges:  OT General Charges $OT Visit: 1 Visit OT Evaluation $OT Eval Low Complexity: 1 Low OT Treatments $Self Care/Home Management : 8-22 mins  Alphia MohKira Cambren Helm OTR/L  Acute Rehab Services  [phone] office number [phone] pager number   Alphia MohKira  Zinnia Tindall 05/21/2019, 10:22 AM

## 2019-05-21 NOTE — Progress Notes (Signed)
Physical Therapy Treatment Patient Details Name: Sandra Lozano MRN: [id number] DOB: 1961/08/02 Today's Date: 05/21/2019    History of Present Illness Pt is a 58 y/o female s/p L4-5 TLIF. PMH inclused pre-diabetes adn HTN.     PT Comments    Pt is doing well today, able to climb stairs and manage gait on RW with close guard of chair to avoid concern about her limited LLE control of knee in standing.  Pt is motivated, able to walk with min guard on RW.  Follow up with acute PT as scheduled, and will monitor for pain during therapy.  Pt was given a pillow for positioning between knees to do sidelying, and was more comfortable afterward.  Able to don brace with cues for managing the strings, and is able to verbalize back precautions.    Follow Up Recommendations  Supervision for mobility/OOB;Home health PT     Equipment Recommendations  Rolling walker with 5" wheels    Recommendations for Other Services       Precautions / Restrictions Precautions Precautions: Back Precaution Booklet Issued: No Precaution Comments: Reviewed back precautions with pt.  Required Braces or Orthoses: Spinal Brace Spinal Brace: Lumbar corset Restrictions Weight Bearing Restrictions: No    Mobility  Bed Mobility Overal bed mobility: Needs Assistance Bed Mobility: Sit to Sidelying Rolling: Modified independent (Device/Increase time) Sidelying to sit: Modified independent (Device/Increase time)     Sit to sidelying: Min assist General bed mobility comments: minor help to get legs onto bed  Transfers Overall transfer level: Needs assistance Equipment used: 1 person hand held assist;Rolling walker (2 wheeled) Transfers: Sit to/from Stand Sit to Stand: Min guard;Min assist(min guard higher surfaces and min assist lower heights)         General transfer comment: cues for sequencing   Ambulation/Gait Ambulation/Gait assistance: Min guard Gait Distance (Feet): 150 Feet Assistive device: Rolling  walker (2 wheeled);1 person hand held assist Gait Pattern/deviations: Decreased stride length;Trunk flexed;Wide base of support;Step-through pattern Gait velocity: reduced Gait velocity interpretation: <1.31 ft/sec, indicative of household ambulator General Gait Details: cued her to manage posture and use walker as LLE is buckling in appearance   Stairs Stairs: Yes Stairs assistance: Min guard Stair Management: Two rails;Forwards;Step to pattern;Alternating pattern(step to with LLE supporting up, step through going down) Number of Stairs: 10 General stair comments: pt is weak on LLE but pain is more with RLE leading up   Wheelchair Mobility    Modified Rankin (Stroke Patients Only)       Balance Overall balance assessment: Needs assistance Sitting-balance support: Feet supported Sitting balance-Leahy Scale: Good     Standing balance support: Bilateral upper extremity supported Standing balance-Leahy Scale: Fair Standing balance comment: reminded pt to support and reduce her pain as well as to be protective of weaker LLE                            Cognition Arousal/Alertness: Awake/alert Behavior During Therapy: WFL for tasks assessed/performed Overall Cognitive Status: Within Functional Limits for tasks assessed                                 General Comments: pt is very attentive to all instructions      Exercises      General Comments General comments (skin integrity, edema, etc.): Pt is motivated to walk and try stairs, did stairs before walking to  guard for pain and weakness in legs      Pertinent Vitals/Pain Pain Assessment: 0-10 Pain Score: 6  Pain Location: back Pain Descriptors / Indicators: Operative site guarding Pain Intervention(s): Monitored during session    Home Living Family/patient expects to be discharged to:: Private residence Living Arrangements: Spouse/significant other Available Help at Discharge:  Family;Available 24 hours/day Type of Home: House Home Access: Stairs to enter Entrance Stairs-Rails: Right;Left;Can reach both Home Layout: One level Home Equipment: Cane - single point;Toilet riser Additional Comments:  needs shower seat     Prior Function Level of Independence: Independent with assistive device(s)      Comments: Reports occasional use of cane    PT Goals (current goals can now be found in the care plan section) Acute Rehab PT Goals Patient Stated Goal: home and feel better PT Goal Formulation: With patient Progress towards PT goals: Progressing toward goals    Frequency    Min 5X/week      PT Plan Current plan remains appropriate    Co-evaluation              AM-PAC PT "6 Clicks" Mobility   Outcome Measure  Help needed turning from your back to your side while in a flat bed without using bedrails?: None Help needed moving from lying on your back to sitting on the side of a flat bed without using bedrails?: A Little Help needed moving to and from a bed to a chair (including a wheelchair)?: A Little Help needed standing up from a chair using your arms (e.g., wheelchair or bedside chair)?: A Little Help needed to walk in hospital room?: A Little Help needed climbing 3-5 steps with a railing? : A Little 6 Click Score: 19    End of Session Equipment Utilized During Treatment: Gait belt Activity Tolerance: Patient limited by pain;Patient limited by fatigue Patient left: in bed;with call bell/phone within reach;with bed alarm set Nurse Communication: Mobility status;Precautions PT Visit Diagnosis: Other abnormalities of gait and mobility (R26.89);Pain Pain - Right/Left: Right Pain - part of body: Leg     Time: [id number] PT Time Calculation (min) (ACUTE ONLY): 29 min  Charges:  $Gait Training: 8-22 mins $Therapeutic Activity: 8-22 mins                    Sandra Lozano 05/21/2019, 1:37 PM  Sandra Lozano, PT MS Acute Rehab Dept. Number: [facility]  [phone] and [facility] [phone]

## 2019-05-22 LAB — GLUCOSE, CAPILLARY
Glucose-Capillary: 113 mg/dL — ABNORMAL HIGH (ref 70–99)
Glucose-Capillary: 112 mg/dL — ABNORMAL HIGH (ref 70–99)
Glucose-Capillary: 141 mg/dL — ABNORMAL HIGH (ref 70–99)

## 2019-05-22 MED ORDER — HYDROCODONE-ACETAMINOPHEN 5-325 MG PO TABS: 1.0000 | ORAL_TABLET | Freq: Four times a day (QID) | ORAL | 0 refills | Status: DC | PRN

## 2019-05-22 NOTE — Progress Notes (Signed)
   Subjective: 2 Days Post-Op Procedure(s) (LRB): L4-5 GILL PROCEDURE, LEFT TRANSFORAMINAL LUMBAR INTERBODY FUSION, PEDICLE INSTRUMENTATION, BILATERAL FUSION (N/A) Patient reports pain as mild and moderate.  Had some nausea.   Objective: Vital signs in last 24 hours: Temp:  [98.8 F (37.1 C)-101.5 F (38.6 C)] 100.9 F (38.3 C) (06/07 0828) Pulse Rate:  [96-111] 105 (06/07 0828) Resp:  [16-18] 18 (06/07 0407) BP: (132-157)/(55-74) 157/73 (06/07 0828) SpO2:  [95 %-100 %] 100 % (06/07 0828)  Intake/Output from previous day: 06/06 0701 - 06/07 0700 In: 240 [P.O.:240] Out: -  Intake/Output this shift: No intake/output data recorded.  Recent Labs    05/21/19 0210  HGB 9.3*   Recent Labs    05/21/19 0210  WBC 11.4*  RBC 3.35*  HCT 28.1*  PLT 284   Recent Labs    05/21/19 0210  NA 138  K 3.8  CL 104  CO2 23  BUN 7  CREATININE 0.62  GLUCOSE 143*  CALCIUM 9.0   No results for input(s): LABPT, INR in the last 72 hours.  Neurologically intact No results found.  Assessment/Plan: 2 Days Post-Op Procedure(s) (LRB): L4-5 GILL PROCEDURE, LEFT TRANSFORAMINAL LUMBAR INTERBODY FUSION, PEDICLE INSTRUMENTATION, BILATERAL FUSION (N/A) Plan:  Discharge  Today, dressing change.   Marybelle Killings 05/22/2019, 1:40 PM

## 2019-05-22 NOTE — Progress Notes (Signed)
Discharge paperwork and instructions given to pt. Pt is not in distress and tolerated well.

## 2019-05-22 NOTE — TOC Initial Note (Signed)
Transition of Care [facility]) - Initial/Assessment Note    Patient Details  Name: Sandra Lozano MRN: [id number] Date of Birth: 1961/01/02  [facility]) CM/SW Contact:    Pollie Friar, RN Phone Number: 05/22/2019, 4:27 PM  Clinical Narrative:                   Expected Discharge Plan: Home/Self Care Barriers to Discharge: No Barriers Identified   Patient Goals and CMS Choice     Choice offered to / list presented to : Patient  Expected Discharge Plan and Services Expected Discharge Plan: Home/Self Care     Post Acute Care Choice: Durable Medical Equipment   Expected Discharge Date: 05/22/19               DME Arranged: Gilford Rile rolling DME Agency: [employer] Date DME Agency Contacted: 05/22/19 Time DME Agency Contacted: 16 Representative spoke with at DME Agency: [place]            Prior Living Arrangements/Services   Lives with:: Spouse Patient language and need for interpreter reviewed:: Yes(no needs) Do you feel safe going back to the place where you live?: Yes      Need for Family Participation in Patient Care: Yes (Comment) Care giver support system in place?: Yes (comment)   Criminal Activity/Legal Involvement Pertinent to Current Situation/Hospitalization: No - Comment as needed  Activities of Daily Living Home Assistive Devices/Equipment: Cane (specify quad or straight), Eyeglasses ADL Screening (condition at time of admission) Patient's cognitive ability adequate to safely complete daily activities?: Yes Is the patient deaf or have difficulty hearing?: No Does the patient have difficulty seeing, even when wearing glasses/contacts?: No Does the patient have difficulty concentrating, remembering, or making decisions?: No Patient able to express need for assistance with ADLs?: Yes Does the patient have difficulty dressing or bathing?: No Independently performs ADLs?: Yes (appropriate for developmental age) Does the patient have difficulty walking  or climbing stairs?: No Weakness of Legs: Both Weakness of Arms/Hands: None  Permission Sought/Granted                  Emotional Assessment Appearance:: Appears stated age Attitude/Demeanor/Rapport: Engaged Affect (typically observed): Accepting Orientation: : Oriented to Self, Oriented to Place, Oriented to  Time, Oriented to Situation   Psych Involvement: No (comment)  Admission diagnosis:  L4-5 Instability  Stenosis Patient Active Problem List   Diagnosis Date Noted  . Lumbar stenosis 05/20/2019  . Spinal instability, lumbar 01/18/2019  . Spinal stenosis of lumbar region with neurogenic claudication 12/28/2018  . It band syndrome, left 09/06/2018  . Acute left-sided low back pain without sciatica 07/18/2018  . Osteoarthritis of right hip 06/03/2018  . Chronic right shoulder pain 05/05/2018  . Right hip pain 05/05/2018  . Healthcare maintenance 05/05/2018  . Rupture of anterior cruciate ligament of right knee 02/15/2018  . Sprain of medial collateral ligament of right knee 02/15/2018  . Essential hypertension 02/15/2018   PCP:  Guadalupe Dawn, MD Pharmacy:   [place], [place] - [address] [place] [address] Phone: [phone] Fax: [phone]     Social Determinants of Health (SDOH) Interventions    Readmission Risk Interventions No flowsheet data found.

## 2019-05-22 NOTE — Progress Notes (Signed)
Patient removed IV multiple times during the night. IV was replaced by primary nurse @1945  and it also was removed by patient. IV consult was placed and attempted x3 . IV was then removed again by patient. IV consult was then placed and she called @ 0630 stating that pt has had maximum attempts for IV insertion in two nights.

## 2019-05-22 NOTE — Plan of Care (Signed)

## 2019-05-22 NOTE — Discharge Instructions (Signed)
Walk daily.  Avoid sitting except when to go to the bathroom when he sit to eat.  No lifting more than 10 pounds.  Best avoid repetitive bending or stooping.  Okay to take a shower leave your dressing on and just dry off with a towel after shower.  See Dr. Lorin Mercy in 1 week.  Pain medication has been sent into your pharmacy at [facility] that she can go pick up.

## 2019-05-22 NOTE — TOC Transition Note (Signed)
Transition of Care [facility]) - CM/SW Discharge Note   Patient Details  Name: Sandra Lozano MRN: [id number] Date of Birth: 1960/12/21  [facility]) CM/SW Contact:  Pollie Friar, RN Phone Number: 05/22/2019, 4:27 PM   Clinical Narrative:    Pt states she has transportation home. Only medication ordered is for pain and she feels she can afford the cost.  Pt to receive walker under charity for home.   Final next level of care: Home/Self Care Barriers to Discharge: No Barriers Identified   Patient Goals and CMS Choice     Choice offered to / list presented to : Patient  Discharge Placement                       Discharge Plan and Services     Post Acute Care Choice: Durable Medical Equipment          DME Arranged: Walker rolling DME Agency: [employer] Date DME Agency Contacted: 05/22/19 Time DME Agency Contacted: [id number] Representative spoke with at DME Agency: [place]            Social Determinants of Health ([place]) Interventions     Readmission Risk Interventions No flowsheet data found.

## 2019-05-23 NOTE — Discharge Summary (Signed)
Patient ID: Sandra Lozano MRN: [id number] DOB/AGE: 1961/02/02 58 y.o.  Admit date: 05/20/2019 Discharge date: 05/23/2019  Admission Diagnoses:  Active Problems:   It band syndrome, left   Spinal stenosis of lumbar region with neurogenic claudication   Spinal instability, lumbar   Lumbar stenosis   Discharge Diagnoses:  Active Problems:   It band syndrome, left   Spinal stenosis of lumbar region with neurogenic claudication   Spinal instability, lumbar   Lumbar stenosis  status post Procedure(s): L4-5 GILL PROCEDURE, LEFT TRANSFORAMINAL LUMBAR INTERBODY FUSION, PEDICLE INSTRUMENTATION, BILATERAL FUSION  Past Medical History:  Diagnosis Date  . Arthritis    right knee, lower back  . Dyspnea    very rare -tx with albuterol neb sol if needed  . Hyperlipidemia   . Hypertension   . Pre-diabetes    per patient - does not check blood sugar  . SVD (spontaneous vaginal delivery)    x 5 - only 2 living, 1 stillborn and 1 premature at 7 months demise,1 child deceased  . Wears dentures    upper and lower    Surgeries: Procedure(s): L4-5 GILL PROCEDURE, LEFT TRANSFORAMINAL LUMBAR INTERBODY FUSION, PEDICLE INSTRUMENTATION, BILATERAL FUSION on 05/20/2019   Consultants:   Discharged Condition: Improved  Hospital Course: Sandra Lozano is an 58 y.o. female who was admitted 05/20/2019 for operative treatment of lumbar stenosis.   Patient failed conservative treatments (please see the history and physical for the specifics) and had severe unremitting pain that affects sleep, daily activities and work/hobbies. After pre-op clearance, the patient was taken to the operating room on 05/20/2019 and underwent  Procedure(s): L4-5 GILL PROCEDURE, LEFT TRANSFORAMINAL LUMBAR INTERBODY FUSION, PEDICLE INSTRUMENTATION, BILATERAL FUSION.    Patient was given perioperative antibiotics:  Anti-infectives (From admission, onward)   Start     Dose/Rate Route Frequency Ordered Stop   05/20/19 1600  ceFAZolin  (ANCEF) IVPB 1 g/50 mL premix     1 g 100 mL/hr over 30 Minutes Intravenous Every 8 hours 05/20/19 1318 05/21/19 0759   05/20/19 0630  ceFAZolin (ANCEF) IVPB 2g/100 mL premix     2 g 200 mL/hr over 30 Minutes Intravenous On call to O.R. 05/20/19 2952 05/20/19 0748   05/20/19 0623  ceFAZolin (ANCEF) 2-4 GM/100ML-% IVPB    Note to Pharmacy:  Jasmine Pang   : cabinet override      05/20/19 0623 05/20/19 0748       Patient was given sequential compression devices and early ambulation to prevent DVT.   Patient benefited maximally from hospital stay and there were no complications. At the time of discharge, the patient was urinating/moving their bowels without difficulty, tolerating a regular diet, pain is controlled with oral pain medications and they have been cleared by PT/OT.   Recent vital signs: No data found.   Recent laboratory studies:  Recent Labs    05/21/19 0210  WBC 11.4*  HGB 9.3*  HCT 28.1*  PLT 284  NA 138  K 3.8  CL 104  CO2 23  BUN 7  CREATININE 0.62  GLUCOSE 143*  CALCIUM 9.0     Discharge Medications:   Allergies as of 05/22/2019   No Known Allergies     Medication List    STOP taking these medications   acetaminophen-codeine 300-30 MG tablet Commonly known as:  TYLENOL #3   meloxicam 7.5 MG tablet Commonly known as:  Mobic   predniSONE 10 MG tablet Commonly known as:  [place] these  medications   albuterol (2.5 MG/3ML) 0.083% nebulizer solution Commonly known as:  PROVENTIL Take 2.5 mg by nebulization every 6 (six) hours as needed for wheezing or shortness of breath.   amLODipine 5 MG tablet Commonly known as:  NORVASC Take 5 mg by mouth daily.   atorvastatin 20 MG tablet Commonly known as:  LIPITOR Take 1 tablet (20 mg total) by mouth daily.   BENGAY EX Apply 1 application topically daily as needed (arthritis pain).   diclofenac sodium 1 % Gel Commonly known as:  VOLTAREN Apply 4 g topically 4 (four) times daily.    HYDROcodone-acetaminophen 5-325 MG tablet Commonly known as:  Norco Take 1-2 tablets by mouth every 6 (six) hours as needed for moderate pain.   lisinopril-hydrochlorothiazide 20-12.5 MG tablet Commonly known as:  Zestoretic Take 1 tablet by mouth daily.   metFORMIN 500 MG tablet Commonly known as:  GLUCOPHAGE Take 1 tablet (500 mg total) by mouth daily with breakfast.   methocarbamol 750 MG tablet Commonly known as:  Robaxin-750 Take 1 tablet (750 mg total) by mouth at bedtime.       Diagnostic Studies: Dg Lumbar Spine Complete  Result Date: 05/20/2019 CLINICAL DATA:  Spinal stenosis at L4-5. Bilateral foraminal stenosis at L4-5. EXAM: LUMBAR SPINE - COMPLETE 4+ VIEW COMPARISON:  MRI dated 11/13/2018 FINDINGS: AP and lateral Lozano-arm images demonstrate the patient undergoing interbody and posterior fusion at L4-5. Hardware appears in good position on the available images. 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 IMPRESSION: Interbody and posterior fusion performed at L4-5. Electronically Signed   By: Francene BoyersJames  Maxwell M.D.   On: 05/20/2019 11:37   Dg Lozano-arm 1-60 Min  Result Date: 05/20/2019 CLINICAL DATA:  Lumbar spine surgery. EXAM: DG Lozano-ARM 61-120 MIN COMPARISON:  MRI 11/13/2018. FINDINGS: Lumbar spine numbered as per prior MRI. L4-L5 posterior and interbody fusion. Hardware intact. Anatomic alignment. Four images obtained. 0 minutes 44 seconds fluoroscopy time. IMPRESSION: L4-L5 posterior and interbody fusion. Hardware intact. Anatomic alignment. Electronically Signed   By: Maisie Fushomas  Register   On: 05/20/2019 11:38      Follow-up Information    Sandra MangesYates, Sandra C, MD Follow up.   Specialty:  Orthopedic Surgery Contact information: [address] [place] [address] [phone]           Discharge Plan:  discharge to home  Disposition:     Signed: Zonia KiefJames Trek Lozano for Sandra GreeningMark Yates MD 05/23/2019, 11:59 AM

## 2019-05-24 ENCOUNTER — Other Ambulatory Visit (INDEPENDENT_AMBULATORY_CARE_PROVIDER_SITE_OTHER): Payer: Self-pay | Admitting: Physical Medicine and Rehabilitation

## 2019-05-24 DIAGNOSIS — M7632 Iliotibial band syndrome, left leg: Secondary | ICD-10-CM

## 2019-05-24 NOTE — Telephone Encounter (Signed)
Please advise 

## 2019-05-27 ENCOUNTER — Telehealth: Payer: Self-pay | Admitting: Family Medicine

## 2019-05-27 NOTE — Telephone Encounter (Signed)
Patient is out of her Cholesterol, blood pressure, and arthritis pain meds, and diabetes meds.  She called into her pharmacy but they told her to come here to request.  [facility] on [address].  [phone].

## 2019-05-30 ENCOUNTER — Other Ambulatory Visit: Payer: Self-pay | Admitting: Family Medicine

## 2019-05-30 MED ORDER — ATORVASTATIN CALCIUM 20 MG PO TABS: 20.0000 mg | ORAL_TABLET | Freq: Every day | ORAL | 1 refills | Status: DC

## 2019-05-30 MED ORDER — MELOXICAM 7.5 MG PO TABS: 7.5000 mg | ORAL_TABLET | Freq: Every day | ORAL | 1 refills | Status: DC

## 2019-05-30 MED ORDER — AMLODIPINE BESYLATE 5 MG PO TABS: 5.0000 mg | ORAL_TABLET | Freq: Every day | ORAL | 0 refills | Status: DC

## 2019-05-30 MED ORDER — METFORMIN HCL 500 MG PO TABS: 500.0000 mg | ORAL_TABLET | Freq: Every day | ORAL | 1 refills | Status: DC

## 2019-05-30 NOTE — Telephone Encounter (Signed)
Refilled. Please see my orders only encounter for more details.  Guadalupe Dawn MD PGY-2 Family Medicine Resident

## 2019-05-30 NOTE — Progress Notes (Signed)
Sent in script for meloxicam 7.5mg . Looks like the last time this was filled was back in august, so I must have fallen off her med list. Updated med list.  Guadalupe Dawn MD PGY-2 Family Medicine Resident

## 2019-05-30 NOTE — Progress Notes (Signed)
Pt informed.  The arthritis med she is talking about is meloxicam. Christen Bame, CMA

## 2019-05-30 NOTE — Progress Notes (Signed)
Refilled amlodipine, metformin, lipitor. I am not sure which arthritis med she is talking about. It says that she is no longer taking the voltaren gel. I will be happy to refill this if she is actually still taking this.  Guadalupe Dawn MD PGY-2 Family Medicine Resident

## 2019-05-31 ENCOUNTER — Ambulatory Visit (INDEPENDENT_AMBULATORY_CARE_PROVIDER_SITE_OTHER): Payer: Medicaid Other

## 2019-05-31 ENCOUNTER — Other Ambulatory Visit: Payer: Self-pay

## 2019-05-31 ENCOUNTER — Ambulatory Visit (INDEPENDENT_AMBULATORY_CARE_PROVIDER_SITE_OTHER): Payer: Medicaid Other | Admitting: Orthopaedic Surgery

## 2019-05-31 ENCOUNTER — Encounter: Payer: Self-pay | Admitting: Orthopaedic Surgery

## 2019-05-31 VITALS — Ht 64.0 in | Wt 204.0 lb

## 2019-05-31 DIAGNOSIS — Z981 Arthrodesis status: Secondary | ICD-10-CM

## 2019-05-31 MED ORDER — ACETAMINOPHEN-CODEINE #3 300-30 MG PO TABS: 1.0000 | ORAL_TABLET | Freq: Three times a day (TID) | ORAL | 0 refills | Status: DC | PRN

## 2019-05-31 NOTE — Progress Notes (Signed)
Post-Op Visit Note   Patient: Sandra Lozano           Date of Birth: 1961-02-21           MRN: [id number] Visit Date: 05/31/2019 PCP: Guadalupe Dawn, MD   Assessment & Plan: Postop single level fusion she is gotten good relief her preop stenosis symptoms.  She is done better with Tylenol 3 and a refill was sent in.  Staples are harvested incision looks good she is happy with the surgical result x-rays look good.  Follow-up in 5 weeks.  Postop she had a note given to her for no work x2 months.  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  Chief Complaint:  Chief Complaint  Patient presents with  . Lower Back - Routine Post Op    05/20/2019 L4-5 GILL procedure, Left TLIF, Pedicle Instrumentation, Bilat Fusion   Visit Diagnoses:  1. Status post lumbar spinal fusion     Plan: Continue walking program.  Recheck 5 weeks.  Tylenol 3 refills sent in.  Staples are harvested today.  She is happy the surgical result.  Follow-Up Instructions: No follow-ups on file.   Orders:  Orders Placed This Encounter  Procedures  . XR Lumbar Spine 2-3 Views   No orders of the defined types were placed in this encounter.   Imaging: Xr Lumbar Spine 2-3 Views  Result Date: 05/31/2019 AP lateral lumbar spine x-rays demonstrate L4-5T lift with bilateral lateral fusion good position of cage graft and screws. Impression: Satisfactory L4-5 single level fusion.   PMFS History: Patient Active Problem List   Diagnosis Date Noted  . Lumbar stenosis 05/20/2019  . Spinal instability, lumbar 01/18/2019  . Spinal stenosis of lumbar region with neurogenic claudication 12/28/2018  . It band syndrome, left 09/06/2018  . Acute left-sided low back pain without sciatica 07/18/2018  . Osteoarthritis of right hip 06/03/2018  . Chronic right shoulder pain 05/05/2018  . Right hip pain 05/05/2018  . Healthcare maintenance 05/05/2018  . Rupture of anterior cruciate ligament of right knee  02/15/2018  . Sprain of medial collateral ligament of right knee 02/15/2018  . Essential hypertension 02/15/2018   Past Medical History:  Diagnosis Date  . Arthritis    right knee, lower back  . Dyspnea    very rare -tx with albuterol neb sol if needed  . Hyperlipidemia   . Hypertension   . Pre-diabetes    per patient - does not check blood sugar  . SVD (spontaneous vaginal delivery)    x 5 - only 2 living, 1 stillborn and 1 premature at 7 months demise,1 child deceased  . Wears dentures    upper and lower    No family history on file.  Past Surgical History:  Procedure Laterality Date  . LUMBAR FUSION  05/20/2019   GILL PROCEDURE, LEFT TRANSFORAMINAL LUMBAR INTERBODY FUSION, PEDICLE INSTRUMENTATION, BILATERAL FUSION (N/A  . TUBAL LIGATION     Social History   Occupational History  . Not on file  Tobacco Use  . Smoking status: Former Smoker    Packs/day: 1.00    Years: 30.00    Pack years: 30.00    Types: Cigarettes    Quit date: 01/2015    Years since quitting: 4.3  . Smokeless tobacco: Never Used  Substance and Sexual Activity  . Alcohol use: Not Currently  . Drug use: Yes    Types: Marijuana    Comment: last use Tuesday, 05/17/19  . Sexual  activity: Not on file

## 2019-06-14 ENCOUNTER — Encounter: Payer: Self-pay | Admitting: Orthopaedic Surgery

## 2019-06-17 ENCOUNTER — Encounter: Payer: Self-pay | Admitting: Orthopaedic Surgery

## 2019-06-20 ENCOUNTER — Encounter: Payer: Self-pay | Admitting: Orthopaedic Surgery

## 2019-06-21 MED ORDER — ACETAMINOPHEN-CODEINE #3 300-30 MG PO TABS: 1.0000 | ORAL_TABLET | Freq: Three times a day (TID) | ORAL | 0 refills | Status: DC | PRN

## 2019-06-23 ENCOUNTER — Encounter: Payer: Self-pay | Admitting: Orthopaedic Surgery

## 2019-07-01 ENCOUNTER — Encounter: Payer: Self-pay | Admitting: Family Medicine

## 2019-07-04 ENCOUNTER — Other Ambulatory Visit: Payer: Self-pay

## 2019-07-04 ENCOUNTER — Ambulatory Visit (INDEPENDENT_AMBULATORY_CARE_PROVIDER_SITE_OTHER): Payer: Medicaid Other | Admitting: Family Medicine

## 2019-07-04 DIAGNOSIS — M545 Low back pain, unspecified: Secondary | ICD-10-CM

## 2019-07-04 DIAGNOSIS — L300 Nummular dermatitis: Secondary | ICD-10-CM | POA: Diagnosis not present

## 2019-07-04 MED ORDER — DICLOFENAC SODIUM 1 % TD GEL: 4.0000 g | Freq: Four times a day (QID) | TRANSDERMAL | 0 refills | Status: DC

## 2019-07-04 MED ORDER — TRIAMCINOLONE ACETONIDE 0.5 % EX OINT: 1.0000 "application " | TOPICAL_OINTMENT | Freq: Two times a day (BID) | CUTANEOUS | 0 refills | Status: DC

## 2019-07-04 NOTE — Patient Instructions (Addendum)
It was great seeing you again today!  I think your rash is likely due to eczema.  You can continue take the triamcinolone ointment.  Sent this into your pharmacy.  Gave you refill the Voltaren gel.  I think that your right chest pain is due to muscle spasm.

## 2019-07-05 ENCOUNTER — Ambulatory Visit (INDEPENDENT_AMBULATORY_CARE_PROVIDER_SITE_OTHER): Payer: Medicaid Other | Admitting: Orthopaedic Surgery

## 2019-07-05 ENCOUNTER — Ambulatory Visit: Payer: Medicaid Other

## 2019-07-05 ENCOUNTER — Encounter: Payer: Self-pay | Admitting: Orthopaedic Surgery

## 2019-07-05 VITALS — Ht 64.0 in | Wt 204.0 lb

## 2019-07-05 DIAGNOSIS — Z981 Arthrodesis status: Secondary | ICD-10-CM

## 2019-07-05 MED ORDER — ACETAMINOPHEN-CODEINE #3 300-30 MG PO TABS: 1.0000 | ORAL_TABLET | Freq: Three times a day (TID) | ORAL | 0 refills | Status: DC | PRN

## 2019-07-05 NOTE — Progress Notes (Signed)
   Post-Op Visit Note   Patient: Sandra Lozano           Date of Birth: 09/29/1961           MRN: [id number] Visit Date: 07/05/2019 PCP: Guadalupe Dawn, MD   Assessment & Plan:  Chief Complaint:  Chief Complaint  Patient presents with  . Lower Back - Follow-up    05/20/2019 L4-5 GILL procedure, Left TLIF, Pedicle Instrumentation, Bilat Fusion   Visit Diagnoses:  1. Status post lumbar spinal fusion     Plan: Follow-up L4-5 fusion.  She occasionally takes Tylenol 3 she is walking a mile a day she uses her cane when she goes out.she states she is ready to resume work as long as she does not do any lifting.  She has some problems with her left knee and states she like to make appointment in 4 to 6 months she will call when she is ready to return.  Work slip given resume light duty work with no lifting more than 20 pounds x 2 months.  Follow-Up Instructions: No follow-ups on file.   Orders:  Orders Placed This Encounter  Procedures  . XR Lumbar Spine 2-3 Views   No orders of the defined types were placed in this encounter.   Imaging: No results found.  PMFS History: Patient Active Problem List   Diagnosis Date Noted  . Lumbar stenosis 05/20/2019  . Spinal instability, lumbar 01/18/2019  . Spinal stenosis of lumbar region with neurogenic claudication 12/28/2018  . It band syndrome, left 09/06/2018  . Acute left-sided low back pain without sciatica 07/18/2018  . Osteoarthritis of right hip 06/03/2018  . Chronic right shoulder pain 05/05/2018  . Right hip pain 05/05/2018  . Healthcare maintenance 05/05/2018  . Rupture of anterior cruciate ligament of right knee 02/15/2018  . Sprain of medial collateral ligament of right knee 02/15/2018  . Essential hypertension 02/15/2018   Past Medical History:  Diagnosis Date  . Arthritis    right knee, lower back  . Dyspnea    very rare -tx with albuterol neb sol if needed  . Hyperlipidemia   . Hypertension   . Pre-diabetes    per  patient - does not check blood sugar  . SVD (spontaneous vaginal delivery)    x 5 - only 2 living, 1 stillborn and 1 premature at 7 months demise,1 child deceased  . Wears dentures    upper and lower    No family history on file.  Past Surgical History:  Procedure Laterality Date  . LUMBAR FUSION  05/20/2019   GILL PROCEDURE, LEFT TRANSFORAMINAL LUMBAR INTERBODY FUSION, PEDICLE INSTRUMENTATION, BILATERAL FUSION (N/A  . TUBAL LIGATION     Social History   Occupational History  . Not on file  Tobacco Use  . Smoking status: Former Smoker    Packs/day: 1.00    Years: 30.00    Pack years: 30.00    Types: Cigarettes    Quit date: 01/2015    Years since quitting: 4.4  . Smokeless tobacco: Never Used  Substance and Sexual Activity  . Alcohol use: Not Currently  . Drug use: Yes    Types: Marijuana    Comment: last use Tuesday, 05/17/19  . Sexual activity: Not on file

## 2019-07-06 ENCOUNTER — Encounter: Payer: Self-pay | Admitting: Family Medicine

## 2019-07-06 DIAGNOSIS — L300 Nummular dermatitis: Secondary | ICD-10-CM | POA: Insufficient documentation

## 2019-07-06 NOTE — Assessment & Plan Note (Signed)
Given the improvement with triamcinolone ointment, and appearance of the rash coupled with her history is likely nummular eczema.  Patient to continue placing triamcinolone ointment on rash.  If this fails to improve she is going to see me, and can treat for likely ringworm.

## 2019-07-06 NOTE — Progress Notes (Signed)
   HPI 58 year old female who presents for rash on right side of breast.  Patient states it started about 1 week ago.  Patient states that she is put triamcinolone ointment on it and it has "made it look better".  Started out as a scaly lesion, but has developed a red mildly erythematous circular border.  CC: Right breast rash   ROS:   Review of Systems See HPI for ROS.   CC, SH/smoking status, and VS noted  Objective: BP 140/80   Pulse 92   LMP  (LMP Unknown)   SpO2 36%  Gen: 58 year old African-American female, no acute distress, resting comfortably CV: RRR, no murmur Resp: CTAB, no wheezes, non-labored Neuro: Alert and oriented, Speech clear, No gross deficits  Derm: Circular scaly rash.  Circular erythematous border.       Assessment and plan:  Nummular eczema Given the improvement with triamcinolone ointment, and appearance of the rash coupled with her history is likely nummular eczema.  Patient to continue placing triamcinolone ointment on rash.  If this fails to improve she is going to see me, and can treat for likely ringworm.   No orders of the defined types were placed in this encounter.   Meds ordered this encounter  Medications  . triamcinolone ointment (KENALOG) 0.5 %    Sig: Apply 1 application topically 2 (two) times daily.    Dispense:  30 g    Refill:  0  . diclofenac sodium (VOLTAREN) 1 % GEL    Sig: Apply 4 g topically 4 (four) times daily.    Dispense:  100 g    Refill:  0     Guadalupe Dawn MD PGY-3 Family Medicine Resident  07/06/2019 9:20 AM

## 2019-07-18 ENCOUNTER — Encounter: Payer: Self-pay | Admitting: Orthopaedic Surgery

## 2019-07-20 ENCOUNTER — Encounter: Payer: Self-pay | Admitting: Orthopaedic Surgery

## 2019-07-21 ENCOUNTER — Encounter: Payer: Self-pay | Admitting: Orthopaedic Surgery

## 2019-07-21 ENCOUNTER — Telehealth: Payer: Self-pay | Admitting: Orthopaedic Surgery

## 2019-07-21 NOTE — Telephone Encounter (Signed)
I don't recall seeing anything. It is not logged in by [employer] either.

## 2019-07-21 NOTE — Telephone Encounter (Signed)
I will let the patient know. If you do get it, will you just bring it to me? Thanks.

## 2019-07-21 NOTE — Telephone Encounter (Signed)
Patient called stating that her employer faxed over an [employer] form that needs to be filled so that she can return to work.  She is wanting to know if you have received that.  CB#[phone].  Thank you

## 2019-07-21 NOTE — Telephone Encounter (Signed)
Have you received form

## 2019-07-25 NOTE — Telephone Encounter (Signed)
Received 8/7. completed & faxed back

## 2019-08-08 ENCOUNTER — Ambulatory Visit: Payer: Medicaid Other | Admitting: Orthopedic Surgery

## 2019-08-15 ENCOUNTER — Ambulatory Visit (INDEPENDENT_AMBULATORY_CARE_PROVIDER_SITE_OTHER): Payer: Medicaid Other | Admitting: Orthopedic Surgery

## 2019-08-15 ENCOUNTER — Encounter: Payer: Self-pay | Admitting: Orthopedic Surgery

## 2019-08-15 VITALS — Ht 64.0 in | Wt 204.0 lb

## 2019-08-15 DIAGNOSIS — M1711 Unilateral primary osteoarthritis, right knee: Secondary | ICD-10-CM

## 2019-08-23 ENCOUNTER — Encounter: Payer: Self-pay | Admitting: Orthopaedic Surgery

## 2019-08-31 ENCOUNTER — Telehealth: Payer: Self-pay | Admitting: Family Medicine

## 2019-08-31 ENCOUNTER — Ambulatory Visit (INDEPENDENT_AMBULATORY_CARE_PROVIDER_SITE_OTHER): Payer: Medicaid Other | Admitting: *Deleted

## 2019-08-31 ENCOUNTER — Encounter: Payer: Self-pay | Admitting: Family Medicine

## 2019-08-31 ENCOUNTER — Other Ambulatory Visit: Payer: Self-pay

## 2019-08-31 DIAGNOSIS — Z23 Encounter for immunization: Secondary | ICD-10-CM

## 2019-08-31 NOTE — Telephone Encounter (Signed)
Pt came in office requesting a referral for mammogram.

## 2019-09-04 ENCOUNTER — Encounter: Payer: Self-pay | Admitting: Family Medicine

## 2019-09-10 ENCOUNTER — Encounter: Payer: Self-pay | Admitting: Orthopedic Surgery

## 2019-09-10 DIAGNOSIS — M1711 Unilateral primary osteoarthritis, right knee: Secondary | ICD-10-CM

## 2019-09-10 MED ORDER — METHYLPREDNISOLONE ACETATE 40 MG/ML IJ SUSP
40.0000 mg | INTRAMUSCULAR | Status: AC | PRN
  Administered 2019-09-10: 40 mg via INTRA_ARTICULAR

## 2019-09-10 MED ORDER — LIDOCAINE HCL 1 % IJ SOLN
5.0000 mL | INTRAMUSCULAR | Status: AC | PRN
  Administered 2019-09-10: 5 mL

## 2019-09-10 NOTE — Progress Notes (Signed)
Office Visit Note   Patient: Sandra Lozano           Date of Birth: Mar 27, 1961           MRN: [id number] Visit Date: 08/15/2019              Requested by: Guadalupe Dawn, MD [phone],  [place] [address] PCP: Guadalupe Dawn, MD  Chief Complaint  Patient presents with  . Right Knee - Pain      HPI: Patient is a 58 year old woman with osteoarthritis of her right knee.  She states the pain is getting worse and she has persistent swelling.  She is not sure that she got relief from the injection in April.  She states she recently had lumbar spine surgery with Dr. Lorin Mercy and this is doing well.  Assessment & Plan: Visit Diagnoses:  1. Unilateral primary osteoarthritis, right knee     Plan: Right knee was injected she was given a application for handicap parking evaluate for possible Synvisc injection at follow-up.  Follow-Up Instructions: Return in about 4 weeks (around 09/12/2019).   Ortho Exam  Patient is alert, oriented, no adenopathy, well-dressed, normal affect, normal respiratory effort. Examination patient does have an antalgic gait.  There is crepitation with range of motion of the knee and the patellofemoral joint she has pain to palpation of the patellofemoral joint as well as medial lateral joint line.  Collaterals and cruciates are stable.  There is no effusion.  Imaging: No results found. No images are attached to the encounter.  Labs: No results found for: HGBA1C, ESRSEDRATE, CRP, LABURIC, REPTSTATUS, GRAMSTAIN, CULT, LABORGA   Lab Results  Component Value Date   ALBUMIN 4.0 05/18/2019    No results found for: MG No results found for: VD25OH  No results found for: PREALBUMIN CBC EXTENDED Latest Ref Rng & Units 05/21/2019 05/18/2019  WBC 4.0 - 10.5 K/uL 11.4(H) 10.5  RBC 3.87 - 5.11 MIL/uL 3.35(L) 4.78  HGB 12.0 - 15.0 g/dL 9.3(L) 13.3  HCT 36.0 - 46.0 % 28.1(L) 41.2  PLT 150 - 400 K/uL 284 356     Body mass index is 35.02 kg/m.   Orders:  No orders of the defined types were placed in this encounter.  No orders of the defined types were placed in this encounter.    Procedures: Large Joint Inj: R knee on 09/10/2019 10:02 AM Indications: pain and diagnostic evaluation Details: 22 G 1.5 in needle, anteromedial approach  Arthrogram: No  Medications: 5 mL lidocaine 1 %; 40 mg methylPREDNISolone acetate 40 MG/ML Outcome: tolerated well, no immediate complications Procedure, treatment alternatives, risks and benefits explained, specific risks discussed. Consent was given by the patient. Immediately prior to procedure a time out was called to verify the correct patient, procedure, equipment, support staff and site/side marked as required. Patient was prepped and draped in the usual sterile fashion.      Clinical Data: No additional findings.  ROS:  All other systems negative, except as noted in the HPI. Review of Systems  Objective: Vital Signs: Ht 5\' 4"  (1.626 m)   Wt 204 lb (92.5 kg)   LMP  (LMP Unknown)   BMI 35.02 kg/m   Specialty Comments:  No specialty comments available.  PMFS History: Patient Active Problem List   Diagnosis Date Noted  . Nummular eczema 07/06/2019  . Lumbar stenosis 05/20/2019  . Spinal instability, lumbar 01/18/2019  . Spinal stenosis of lumbar region with neurogenic claudication 12/28/2018  . It  band syndrome, left 09/06/2018  . Acute left-sided low back pain without sciatica 07/18/2018  . Osteoarthritis of right hip 06/03/2018  . Chronic right shoulder pain 05/05/2018  . Right hip pain 05/05/2018  . Healthcare maintenance 05/05/2018  . Rupture of anterior cruciate ligament of right knee 02/15/2018  . Sprain of medial collateral ligament of right knee 02/15/2018  . Essential hypertension 02/15/2018   Past Medical History:  Diagnosis Date  . Arthritis    right knee, lower back  . Dyspnea    very rare -tx with albuterol neb sol if needed  . Hyperlipidemia   .  Hypertension   . Pre-diabetes    per patient - does not check blood sugar  . SVD (spontaneous vaginal delivery)    x 5 - only 2 living, 1 stillborn and 1 premature at 7 months demise,1 child deceased  . Wears dentures    upper and lower    History reviewed. No pertinent family history.  Past Surgical History:  Procedure Laterality Date  . LUMBAR FUSION  05/20/2019   GILL PROCEDURE, LEFT TRANSFORAMINAL LUMBAR INTERBODY FUSION, PEDICLE INSTRUMENTATION, BILATERAL FUSION (N/A  . TUBAL LIGATION     Social History   Occupational History  . Not on file  Tobacco Use  . Smoking status: Former Smoker    Packs/day: 1.00    Years: 30.00    Pack years: 30.00    Types: Cigarettes    Quit date: 01/2015    Years since quitting: 4.6  . Smokeless tobacco: Never Used  Substance and Sexual Activity  . Alcohol use: Not Currently  . Drug use: Yes    Types: Marijuana    Comment: last use Tuesday, 05/17/19  . Sexual activity: Not on file

## 2019-09-12 ENCOUNTER — Ambulatory Visit (INDEPENDENT_AMBULATORY_CARE_PROVIDER_SITE_OTHER): Payer: Medicaid Other | Admitting: Orthopedic Surgery

## 2019-09-12 ENCOUNTER — Encounter: Payer: Self-pay | Admitting: Orthopedic Surgery

## 2019-09-12 VITALS — Ht 64.0 in | Wt 204.0 lb

## 2019-09-12 DIAGNOSIS — M1711 Unilateral primary osteoarthritis, right knee: Secondary | ICD-10-CM

## 2019-09-12 NOTE — Progress Notes (Signed)
Patient presented requesting a hyaluronic acid injection at this time.  She did not want to be seen.  No examination today we will request hyaluronic acid injection.

## 2019-09-13 ENCOUNTER — Encounter: Payer: Self-pay | Admitting: Orthopaedic Surgery

## 2019-09-13 ENCOUNTER — Telehealth: Payer: Self-pay

## 2019-09-13 NOTE — Telephone Encounter (Signed)
Will mail patient [employer] application to complete and return back to the office to be submitted for gel injection for right knee.

## 2019-09-13 NOTE — Telephone Encounter (Signed)
-----   Message from Lorayne Bender, [place] sent at 09/12/2019  1:43 PM EDT ----- Regarding: Gel Injection Hey Hannelore Bova  Can you please see if this patient is able to get a gel injection approval. Thank you

## 2019-09-15 ENCOUNTER — Telehealth: Payer: Self-pay

## 2019-09-15 NOTE — Telephone Encounter (Signed)
Mailed [employer] application to patient's address listed in chart.

## 2019-09-21 ENCOUNTER — Ambulatory Visit: Payer: Self-pay

## 2019-09-21 ENCOUNTER — Other Ambulatory Visit: Payer: Self-pay

## 2019-09-21 ENCOUNTER — Ambulatory Visit (INDEPENDENT_AMBULATORY_CARE_PROVIDER_SITE_OTHER): Payer: Medicaid Other | Admitting: Surgery

## 2019-09-21 ENCOUNTER — Encounter: Payer: Self-pay | Admitting: Surgery

## 2019-09-21 VITALS — Ht 64.0 in | Wt 204.0 lb

## 2019-09-21 DIAGNOSIS — Z981 Arthrodesis status: Secondary | ICD-10-CM

## 2019-09-21 NOTE — Progress Notes (Signed)
Office Visit Note   Patient: Sandra Lozano           Date of Birth: July 29, 1961           MRN: [id number] Visit Date: 09/21/2019              Requested by: Guadalupe Dawn, MD [phone] [address],  [place] [address] PCP: Guadalupe Dawn, MD   Assessment & Plan: Visit Diagnoses:  1. Status post lumbar spinal fusion     Plan: Reassurance given.  Patient can continue taking Aleve.  I did advise her to back off on some of her activities if she feels like this is aggravating her low back pain.  She did mention trying to use the treadmill but I advised her against this due to the risk of potential falls.  She voices understanding.  Follow-up in 2 months for recheck when she is 6 months out.  Return sooner if needed.  Follow-Up Instructions: Return in about 2 months (around 11/21/2019).   Orders:  Orders Placed This Encounter  Procedures   XR Lumbar Spine 2-3 Views   No orders of the defined types were placed in this encounter.     Procedures: No procedures performed   Clinical Data: No additional findings.   Subjective: Chief Complaint  Patient presents with   Lower Back - Follow-up    HPI 58 year old black female who is about 4 months status post L4-5 fusion by Dr. Lorin Mercy returns.  States that she may have over did it at the gym using the recumbent bike.  Complains of having some spasms left lumbar region.  Denies lower extremity radiculopathy.  She just wanted to make sure that her fusion is stable.  States that overall she has been doing very well and pleased with her surgical result up to this point. Review of Systems No current cardiac pulmonary GI GU issues  Objective: Vital Signs: Ht 5\' 4"  (1.626 m)    Wt 204 lb (92.5 kg)    LMP  (LMP Unknown)    BMI 35.02 kg/m   Physical Exam Gait is normal.  Negative log about his.  She does have mild left-sided lumbar paraspinal tenderness and spasm.  Negative straight leg raise.  Neuro vas intact.  No focal motor  deficits. Ortho Exam  Specialty Comments:  No specialty comments available.  Imaging: No results found.   PMFS History: Patient Active Problem List   Diagnosis Date Noted   Nummular eczema 07/06/2019   Lumbar stenosis 05/20/2019   Spinal instability, lumbar 01/18/2019   Spinal stenosis of lumbar region with neurogenic claudication 12/28/2018   It band syndrome, left 09/06/2018   Acute left-sided low back pain without sciatica 07/18/2018   Osteoarthritis of right hip 06/03/2018   Chronic right shoulder pain 05/05/2018   Right hip pain 05/05/2018   Healthcare maintenance 05/05/2018   Rupture of anterior cruciate ligament of right knee 02/15/2018   Sprain of medial collateral ligament of right knee 02/15/2018   Essential hypertension 02/15/2018   Past Medical History:  Diagnosis Date   Arthritis    right knee, lower back   Dyspnea    very rare -tx with albuterol neb sol if needed   Hyperlipidemia    Hypertension    Pre-diabetes    per patient - does not check blood sugar   SVD (spontaneous vaginal delivery)    x 5 - only 2 living, 1 stillborn and 1 premature at 7 months demise,1 child deceased  Wears dentures    upper and lower    No family history on file.  Past Surgical History:  Procedure Laterality Date   LUMBAR FUSION  05/20/2019   GILL PROCEDURE, LEFT TRANSFORAMINAL LUMBAR INTERBODY FUSION, PEDICLE INSTRUMENTATION, BILATERAL FUSION (N/A   TUBAL LIGATION     Social History   Occupational History   Not on file  Tobacco Use   Smoking status: Former Smoker    Packs/day: 1.00    Years: 30.00    Pack years: 30.00    Types: Cigarettes    Quit date: 01/2015    Years since quitting: 4.6   Smokeless tobacco: Never Used  Substance and Sexual Activity   Alcohol use: Not Currently   Drug use: Yes    Types: Marijuana    Comment: last use Tuesday, 05/17/19   Sexual activity: Not on file

## 2019-09-23 ENCOUNTER — Telehealth: Payer: Self-pay

## 2019-09-23 NOTE — Telephone Encounter (Signed)
Faxed completed [employer] for Monovisc, right knee to [phone].

## 2019-09-27 ENCOUNTER — Telehealth: Payer: Self-pay

## 2019-09-27 NOTE — Telephone Encounter (Signed)
Received Approval letter and PRF from [employer] for Monovisc, right knee. Faxed completed PRF to [employer] at [phone].

## 2019-10-04 ENCOUNTER — Encounter: Payer: Self-pay | Admitting: Family Medicine

## 2019-10-06 ENCOUNTER — Other Ambulatory Visit: Payer: Self-pay | Admitting: Family Medicine

## 2019-10-06 MED ORDER — MELOXICAM 7.5 MG PO TABS: 7.5000 mg | ORAL_TABLET | Freq: Every day | ORAL | 0 refills | Status: DC

## 2019-10-18 ENCOUNTER — Encounter: Payer: Self-pay | Admitting: Orthopedic Surgery

## 2019-10-18 ENCOUNTER — Ambulatory Visit: Payer: Medicaid Other | Admitting: Orthopedic Surgery

## 2019-10-18 ENCOUNTER — Other Ambulatory Visit: Payer: Self-pay

## 2019-10-18 VITALS — Ht 64.0 in | Wt 204.0 lb

## 2019-10-18 DIAGNOSIS — M1711 Unilateral primary osteoarthritis, right knee: Secondary | ICD-10-CM | POA: Diagnosis not present

## 2019-10-18 MED ORDER — LIDOCAINE HCL 1 % IJ SOLN
1.0000 mL | INTRAMUSCULAR | Status: AC | PRN
  Administered 2019-10-18: 1 mL

## 2019-10-18 MED ORDER — HYALURONAN 88 MG/4ML IX SOSY
88.0000 mg | PREFILLED_SYRINGE | INTRA_ARTICULAR | Status: AC | PRN
  Administered 2019-10-18: 88 mg via INTRA_ARTICULAR

## 2019-10-18 NOTE — Progress Notes (Signed)
Office Visit Note   Patient: Sandra Lozano           Date of Birth: September 22, 1961           MRN: [id number] Visit Date: 10/18/2019              Requested by: Myrene Buddy, MD [phone] [address] [place],  [place] [address] PCP: Myrene Buddy, MD  Chief Complaint  Patient presents with  . Right Knee - Pain    monovisc injection shipped to office.       HPI: Patient is a 58 year old woman with osteoarthritis of her right knee.  She states the pain is getting worse and she has persistent swelling.  Has not had good relief with depomedrol. Presents today for supplemental injection.   Assessment & Plan: Visit Diagnoses:  1. Unilateral primary osteoarthritis, right knee     Plan: Right knee was injected. Patient tolerated well. Shell follow up as needed.   Follow-Up Instructions: Return if symptoms worsen or fail to improve.   Ortho Exam  Patient is alert, oriented, no adenopathy, well-dressed, normal affect, normal respiratory effort. Examination patient does have an antalgic gait.  There is crepitation with range of motion of the knee and the patellofemoral joint she has pain to palpation of the patellofemoral joint as well as medial lateral joint line.  Collaterals and cruciates are stable.  There is no effusion.  Imaging: No results found. No images are attached to the encounter.  Labs: No results found for: HGBA1C, ESRSEDRATE, CRP, LABURIC, REPTSTATUS, GRAMSTAIN, CULT, LABORGA   Lab Results  Component Value Date   ALBUMIN 4.0 05/18/2019    No results found for: MG No results found for: VD25OH  No results found for: PREALBUMIN CBC EXTENDED Latest Ref Rng & Units 05/21/2019 05/18/2019  WBC 4.0 - 10.5 K/uL 11.4(H) 10.5  RBC 3.87 - 5.11 MIL/uL 3.35(L) 4.78  HGB 12.0 - 15.0 g/dL 2.5(Z) 56.3  HCT 87.5 - 46.0 % 28.1(L) 41.2  PLT 150 - 400 K/uL 284 356     Body mass index is 35.02 kg/m.  Orders:  No orders of the defined types were placed in this encounter.  No  orders of the defined types were placed in this encounter.    Procedures: Large Joint Inj on 10/18/2019 1:45 PM Indications: pain Details: 18 G 1.5 in needle, anteromedial approach Medications: 1 mL lidocaine 1 %; 88 mg Hyaluronan 88 MG/4ML Consent was given by the patient.      Clinical Data: No additional findings.  ROS:  All other systems negative, except as noted in the HPI. Review of Systems  Constitutional: Negative for chills and fever.  Musculoskeletal: Positive for arthralgias.    Objective: Vital Signs: Ht 5\' 4"  (1.626 m)   Wt 204 lb (92.5 kg)   LMP  (LMP Unknown)   BMI 35.02 kg/m   Specialty Comments:  No specialty comments available.  PMFS History: Patient Active Problem List   Diagnosis Date Noted  . Nummular eczema 07/06/2019  . Lumbar stenosis 05/20/2019  . Spinal instability, lumbar 01/18/2019  . Spinal stenosis of lumbar region with neurogenic claudication 12/28/2018  . It band syndrome, left 09/06/2018  . Acute left-sided low back pain without sciatica 07/18/2018  . Osteoarthritis of right hip 06/03/2018  . Chronic right shoulder pain 05/05/2018  . Right hip pain 05/05/2018  . Healthcare maintenance 05/05/2018  . Rupture of anterior cruciate ligament of right knee 02/15/2018  . Sprain of medial collateral ligament of  right knee 02/15/2018  . Essential hypertension 02/15/2018   Past Medical History:  Diagnosis Date  . Arthritis    right knee, lower back  . Dyspnea    very rare -tx with albuterol neb sol if needed  . Hyperlipidemia   . Hypertension   . Pre-diabetes    per patient - does not check blood sugar  . SVD (spontaneous vaginal delivery)    x 5 - only 2 living, 1 stillborn and 1 premature at 7 months demise,1 child deceased  . Wears dentures    upper and lower    History reviewed. No pertinent family history.  Past Surgical History:  Procedure Laterality Date  . LUMBAR FUSION  05/20/2019   GILL PROCEDURE, LEFT TRANSFORAMINAL  LUMBAR INTERBODY FUSION, PEDICLE INSTRUMENTATION, BILATERAL FUSION (N/A  . TUBAL LIGATION     Social History   Occupational History  . Not on file  Tobacco Use  . Smoking status: Former Smoker    Packs/day: 1.00    Years: 30.00    Pack years: 30.00    Types: Cigarettes    Quit date: 01/2015    Years since quitting: 4.7  . Smokeless tobacco: Never Used  Substance and Sexual Activity  . Alcohol use: Not Currently  . Drug use: Yes    Types: Marijuana    Comment: last use Tuesday, 05/17/19  . Sexual activity: Not on file

## 2019-11-01 ENCOUNTER — Other Ambulatory Visit: Payer: Self-pay

## 2019-11-01 ENCOUNTER — Encounter: Payer: Self-pay | Admitting: Family Medicine

## 2019-11-01 ENCOUNTER — Ambulatory Visit: Payer: Medicaid Other | Admitting: Family Medicine

## 2019-11-01 VITALS — BP 180/92 | HR 94 | Wt 213.8 lb

## 2019-11-01 DIAGNOSIS — I10 Essential (primary) hypertension: Secondary | ICD-10-CM

## 2019-11-01 DIAGNOSIS — R7303 Prediabetes: Secondary | ICD-10-CM

## 2019-11-01 DIAGNOSIS — R200 Anesthesia of skin: Secondary | ICD-10-CM

## 2019-11-01 DIAGNOSIS — G629 Polyneuropathy, unspecified: Secondary | ICD-10-CM

## 2019-11-01 LAB — POCT GLYCOSYLATED HEMOGLOBIN (HGB A1C): HbA1c, POC (controlled diabetic range): 6.3 % (ref 0.0–7.0)

## 2019-11-01 MED ORDER — LISINOPRIL-HYDROCHLOROTHIAZIDE 20-12.5 MG PO TABS: 1.0000 | ORAL_TABLET | Freq: Every day | ORAL | 3 refills | Status: DC

## 2019-11-01 MED ORDER — METFORMIN HCL 500 MG PO TABS: 500.0000 mg | ORAL_TABLET | Freq: Two times a day (BID) | ORAL | 1 refills | Status: DC

## 2019-11-01 NOTE — Patient Instructions (Addendum)
It was great seeing you again today!  I am sorry about this numbness and tingling in your hands and feet.  We did a arterial brachial index which showed that you have good blood flow to both your legs.  We performed a hemoglobin A1c which showed that you have well-controlled diabetes.  I think the next step is to check your vitamin B12 and your folate levels.  I believe the starting your Zestoretic blood pressure medication will help lower this will also be beneficial to your kidneys due to your diabetes.

## 2019-11-02 ENCOUNTER — Encounter: Payer: Self-pay | Admitting: Family Medicine

## 2019-11-02 LAB — FOLATE: Folate: 10.9 ng/mL (ref >3.0)

## 2019-11-02 LAB — VITAMIN B12: Vitamin B-12: 646 pg/mL (ref 232–1245)

## 2019-11-07 DIAGNOSIS — G629 Polyneuropathy, unspecified: Secondary | ICD-10-CM | POA: Insufficient documentation

## 2019-11-07 DIAGNOSIS — R7303 Prediabetes: Secondary | ICD-10-CM | POA: Insufficient documentation

## 2019-11-07 DIAGNOSIS — E119 Type 2 diabetes mellitus without complications: Secondary | ICD-10-CM | POA: Insufficient documentation

## 2019-11-07 NOTE — Assessment & Plan Note (Signed)
Drew folate and B12 which were normal.  ABIs normal.  A1c 6.3.  Best explanation for patient's neuropathy is likely secondary to diabetes, although this is a little less likely given her well-controlled.  Can consider gabapentin if still having issues.  Will give Metformin 500 mg twice daily for diabetes.

## 2019-11-07 NOTE — Assessment & Plan Note (Signed)
BP 180/92 at clinic visit.  No symptoms.  We will restart her home lisinopril/hydrochlorothiazide (20/12.5) combo tablet.  Follow-up in 3 weeks or so for BMP and recheck.

## 2019-11-07 NOTE — Assessment & Plan Note (Signed)
Technically prediabetes but A1c very close to diagnostic at 6.3.  Recheck in 3 months.  Start Metformin 500 mg twice daily.  Best explanation for her neuropathy.  Restarting her lisinopril/hydrochlorothiazide for blood pressure.  Lisinopril aspect will be beneficial for her kidneys.

## 2019-11-07 NOTE — Progress Notes (Signed)
   HPI 58 year old female who presents for numbness and tingling feet and hands.  She states this is been going on for a few weeks now.  It has been more gradual onset.  The distribution of the symptoms is located to her anterior dorsal foot.  She also intermittently has this sensation in all of her fingertips.  It typically is worse at night.  Her BP is 180/92 clinic appointment.  She has not been taking any blood pressure medication.  She does not endorse any headaches or vision disturbances.  Checked A1c this appointment and is 6.3.  Did ABIs which were both normal.   CC: Bilateral lower extremity weakness   ROS:   Review of Systems See HPI for ROS.   CC, SH/smoking status, and VS noted  Objective: BP (!) 180/92   Pulse 94   Wt 213 lb 12.8 oz (97 kg)   LMP  (LMP Unknown)   SpO2 97%   BMI 36.70 kg/m  Gen: 58 year old African-American female, no acute distress CV: Regular rate and rhythm, no M/R/G.  Skin warm and dry. Resp: Lungs clear to auscultation bilaterally Neuro: Sensation intact all distributions bilateral lower extremity.  Symmetric 5 out of 5 strength all muscle groups bilateral lower extremity, bilateral upper extremity.  CN II through XII intact.   Assessment and plan:  Neuropathy Drew folate and B12 which were normal.  ABIs normal.  A1c 6.3.  Best explanation for patient's neuropathy is likely secondary to diabetes, although this is a little less likely given her well-controlled.  Can consider gabapentin if still having issues.  Will give Metformin 500 mg twice daily for diabetes.  Pre-diabetes Technically prediabetes but A1c very close to diagnostic at 6.3.  Recheck in 3 months.  Start Metformin 500 mg twice daily.  Best explanation for her neuropathy.  Restarting her lisinopril/hydrochlorothiazide for blood pressure.  Lisinopril aspect will be beneficial for her kidneys.  Essential hypertension BP 180/92 at clinic visit.  No symptoms.  We will restart her home  lisinopril/hydrochlorothiazide (20/12.5) combo tablet.  Follow-up in 3 weeks or so for BMP and recheck.   Orders Placed This Encounter  Procedures  . Folate  . Vitamin B12  . HgB A1c  . POCT ABI Screening Pilot No Charge    This Order is for screening for the ABI Pilot.  It is a no charge exam.     Meds ordered this encounter  Medications  . lisinopril-hydrochlorothiazide (ZESTORETIC) 20-12.5 MG tablet    Sig: Take 1 tablet by mouth daily.    Dispense:  90 tablet    Refill:  3  . metFORMIN (GLUCOPHAGE) 500 MG tablet    Sig: Take 1 tablet (500 mg total) by mouth 2 (two) times daily with a meal.    Dispense:  90 tablet    Refill:  1   Guadalupe Dawn MD PGY-3 Family Medicine Resident  11/07/2019 11:49 AM

## 2019-12-20 ENCOUNTER — Other Ambulatory Visit: Payer: Self-pay | Admitting: Family Medicine

## 2020-01-19 ENCOUNTER — Other Ambulatory Visit: Payer: Self-pay | Admitting: Family Medicine

## 2020-01-23 ENCOUNTER — Other Ambulatory Visit: Payer: Self-pay | Admitting: Family Medicine

## 2020-01-25 ENCOUNTER — Other Ambulatory Visit: Payer: Self-pay

## 2020-01-25 ENCOUNTER — Ambulatory Visit (INDEPENDENT_AMBULATORY_CARE_PROVIDER_SITE_OTHER): Payer: Medicaid Other | Admitting: Physician Assistant

## 2020-01-25 ENCOUNTER — Ambulatory Visit: Payer: Self-pay

## 2020-01-25 ENCOUNTER — Encounter: Payer: Self-pay | Admitting: Physician Assistant

## 2020-01-25 ENCOUNTER — Ambulatory Visit (INDEPENDENT_AMBULATORY_CARE_PROVIDER_SITE_OTHER): Payer: Medicaid Other

## 2020-01-25 VITALS — Ht 64.0 in | Wt 213.0 lb

## 2020-01-25 DIAGNOSIS — M79671 Pain in right foot: Secondary | ICD-10-CM | POA: Diagnosis not present

## 2020-01-25 DIAGNOSIS — M79672 Pain in left foot: Secondary | ICD-10-CM

## 2020-01-25 MED ORDER — PREDNISONE 10 MG PO TABS: 10.0000 mg | ORAL_TABLET | Freq: Every day | ORAL | 0 refills | Status: DC

## 2020-01-25 NOTE — Progress Notes (Signed)
Office Visit Note   Patient: Sandra Lozano           Date of Birth: 1960/12/18           MRN: [id number] Visit Date: 01/25/2020              Requested by: Myrene Buddy, MD [phone] [address] [place],  [place] [address] PCP: Myrene Buddy, MD  Chief Complaint  Patient presents with  . Left Ankle - Pain      HPI: This is a pleasant 59 year old woman with a chief complaint of bilateral foot numbness and pain over the dorsum of her left foot.  She states she does have difficulty going barefoot.  She said this just began as numbness.  She denies any injury.  She has a history of lumbar back surgery.  She denies any weakness.  She said if she tries to walk without shoes then the bottom of her feet hurt left greater than right  Assessment & Plan: Visit Diagnoses:  1. Bilateral foot pain     Plan: We talked about the importance of shoes that are stiff and supportive such as a [place].  I think her problem is neuropathy as well as bilateral midfoot arthritis with significant changes in the left probably due to previous injury.  We will start her on a course of prednisone.  If she does not get relief it would be reasonable as well to try her on some Neurontin.  She will follow-up in 3 weeks.  Follow-Up Instructions: No follow-ups on file.   Ortho Exam  Patient is alert, oriented, no adenopathy, well-dressed, normal affect, normal respiratory effort. Focused examination of her feet: No soft tissue swelling she does have some prominence over the dorsum of the left greater than right.  She is tender to deep palpation over the TMT joints on the left foot.  There is no noted deformity except for the prominence over the joint.  Decreased sensation over the dorsum and plantar surfaces of both feet Dorsalis pedis pulse is palpable Imaging: No results found. No images are attached to the encounter.  Labs: Lab Results  Component Value Date   HGBA1C 6.3 11/01/2019     Lab Results  Component  Value Date   ALBUMIN 4.0 05/18/2019    No results found for: MG No results found for: VD25OH  No results found for: PREALBUMIN CBC EXTENDED Latest Ref Rng & Units 05/21/2019 05/18/2019  WBC 4.0 - 10.5 K/uL 11.4(H) 10.5  RBC 3.87 - 5.11 MIL/uL 3.35(L) 4.78  HGB 12.0 - 15.0 g/dL 3.8(S) 50.5  HCT 39.7 - 46.0 % 28.1(L) 41.2  PLT 150 - 400 K/uL 284 356     Body mass index is 36.56 kg/m.  Orders:  Orders Placed This Encounter  Procedures  . XR Foot 2 Views Right  . XR Foot 2 Views Left   No orders of the defined types were placed in this encounter.    Procedures: No procedures performed  Clinical Data: No additional findings.  ROS:  All other systems negative, except as noted in the HPI. Review of Systems  Objective: Vital Signs: Ht 5\' 4"  (1.626 m)   Wt 213 lb (96.6 kg)   LMP  (LMP Unknown)   BMI 36.56 kg/m   Specialty Comments:  No specialty comments available.  PMFS History: Patient Active Problem List   Diagnosis Date Noted  . Neuropathy 11/07/2019  . Pre-diabetes 11/07/2019  . Nummular eczema 07/06/2019  . Lumbar stenosis 05/20/2019  .  Spinal instability, lumbar 01/18/2019  . Spinal stenosis of lumbar region with neurogenic claudication 12/28/2018  . It band syndrome, left 09/06/2018  . Acute left-sided low back pain without sciatica 07/18/2018  . Osteoarthritis of right hip 06/03/2018  . Chronic right shoulder pain 05/05/2018  . Right hip pain 05/05/2018  . Healthcare maintenance 05/05/2018  . Rupture of anterior cruciate ligament of right knee 02/15/2018  . Sprain of medial collateral ligament of right knee 02/15/2018  . Essential hypertension 02/15/2018   Past Medical History:  Diagnosis Date  . Arthritis    right knee, lower back  . Dyspnea    very rare -tx with albuterol neb sol if needed  . Hyperlipidemia   . Hypertension   . Pre-diabetes    per patient - does not check blood sugar  . SVD (spontaneous vaginal delivery)    x 5 - only 2  living, 1 stillborn and 1 premature at 7 months demise,1 child deceased  . Wears dentures    upper and lower    No family history on file.  Past Surgical History:  Procedure Laterality Date  . LUMBAR FUSION  05/20/2019   GILL PROCEDURE, LEFT TRANSFORAMINAL LUMBAR INTERBODY FUSION, PEDICLE INSTRUMENTATION, BILATERAL FUSION (N/A  . TUBAL LIGATION     Social History   Occupational History  . Not on file  Tobacco Use  . Smoking status: Former Smoker    Packs/day: 1.00    Years: 30.00    Pack years: 30.00    Types: Cigarettes    Quit date: 01/2015    Years since quitting: 5.0  . Smokeless tobacco: Never Used  Substance and Sexual Activity  . Alcohol use: Not Currently  . Drug use: Yes    Types: Marijuana    Comment: last use Tuesday, 05/17/19  . Sexual activity: Not on file

## 2020-01-30 ENCOUNTER — Ambulatory Visit: Payer: Medicaid Other | Admitting: Family Medicine

## 2020-02-15 ENCOUNTER — Other Ambulatory Visit: Payer: Self-pay

## 2020-02-15 ENCOUNTER — Ambulatory Visit (INDEPENDENT_AMBULATORY_CARE_PROVIDER_SITE_OTHER): Payer: Medicaid Other | Admitting: Physician Assistant

## 2020-02-15 ENCOUNTER — Encounter: Payer: Self-pay | Admitting: Physician Assistant

## 2020-02-15 VITALS — Ht 64.0 in | Wt 213.0 lb

## 2020-02-15 DIAGNOSIS — M79672 Pain in left foot: Secondary | ICD-10-CM | POA: Diagnosis not present

## 2020-02-15 DIAGNOSIS — M79671 Pain in right foot: Secondary | ICD-10-CM | POA: Diagnosis not present

## 2020-02-15 MED ORDER — GABAPENTIN 300 MG PO CAPS: 300.0000 mg | ORAL_CAPSULE | Freq: Three times a day (TID) | ORAL | 3 refills | Status: DC

## 2020-02-15 MED ORDER — METHOCARBAMOL 500 MG PO TABS: 500.0000 mg | ORAL_TABLET | Freq: Four times a day (QID) | ORAL | 0 refills | Status: DC | PRN

## 2020-02-15 NOTE — Progress Notes (Signed)
Office Visit Note   Patient: Sandra Lozano           Date of Birth: 1961/09/21           MRN: [id number] Visit Date: 02/15/2020              Requested by: Myrene Buddy, MD [phone] [address] [place],  [place] [address] PCP: Myrene Buddy, MD  Chief Complaint  Patient presents with  . Left Foot - Follow-up  . Right Foot - Follow-up  . Lower Back - Pain    05/2019 lumbar fusion with Dr. Ophelia Charter       HPI: This is a pleasant woman who follows up for her bilateral foot pain and numbness.  At her last visit we discussed that her issues were stemming from her midfoot arthritis and or some neuropathy.  She did think the prednisone helps a little bit but she feels the pain is more neuropathic.  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  She said this also may be due to some workout she is doing.  She is status post spine fusion by Dr. Ophelia Charter.  She denies any paresthesias she denies any radicular pain but points the pain over the paravertebral muscles in her lower back  Assessment & Plan: Visit Diagnoses: No diagnosis found.  Plan: We will try her on Neurontin for her feet.  With regards to her back I believe this is all muscular based on my examination.  She does not have any neuro deficits.  We will try her on a course of Robaxin.  I also discussed with her the importance of core strengthening follow-up in 3 weeks  Follow-Up Instructions: No follow-ups on file.   Ortho Exam  Patient is alert, oriented, no adenopathy, well-dressed, normal affect, normal respiratory effort. Focused examination demonstrates no cellulitis no drainage no ulcers she does have some hypersensitivity to try to touch  Lower back she is tender over the paravertebral muscles.  No tenderness over the spine she has flexion and extension without any changes.  She actually states it feels good when she stretches.  No straight leg raise good lower extremity strength  bilaterally  Imaging: No results found. No images are attached to the encounter.  Labs: Lab Results  Component Value Date   HGBA1C 6.3 11/01/2019     Lab Results  Component Value Date   ALBUMIN 4.0 05/18/2019    No results found for: MG No results found for: VD25OH  No results found for: PREALBUMIN CBC EXTENDED Latest Ref Rng & Units 05/21/2019 05/18/2019  WBC 4.0 - 10.5 K/uL 11.4(H) 10.5  RBC 3.87 - 5.11 MIL/uL 3.35(L) 4.78  HGB 12.0 - 15.0 g/dL 7.8(G) 95.6  HCT 21.3 - 46.0 % 28.1(L) 41.2  PLT 150 - 400 K/uL 284 356     Body mass index is 36.56 kg/m.  Orders:  No orders of the defined types were placed in this encounter.  Meds ordered this encounter  Medications  . gabapentin (NEURONTIN) 300 MG capsule    Sig: Take 1 capsule (300 mg total) by mouth 3 (three) times daily. 1 by mouth in the evening x3 days the 1 PO BID x 3 days then increase to 1 po TID if tolerated    Dispense:  60 capsule    Refill:  3  . methocarbamol (ROBAXIN) 500 MG tablet    Sig: Take 1 tablet (500 mg total) by mouth  every 6 (six) hours as needed for muscle spasms.    Dispense:  30 tablet    Refill:  0     Procedures: No procedures performed  Clinical Data: No additional findings.  ROS:  All other systems negative, except as noted in the HPI. Review of Systems  Objective: Vital Signs: Ht 5\' 4"  (1.626 m)   Wt 213 lb (96.6 kg)   LMP  (LMP Unknown)   BMI 36.56 kg/m   Specialty Comments:  No specialty comments available.  PMFS History: Patient Active Problem List   Diagnosis Date Noted  . Neuropathy 11/07/2019  . Pre-diabetes 11/07/2019  . Nummular eczema 07/06/2019  . Lumbar stenosis 05/20/2019  . Spinal instability, lumbar 01/18/2019  . Spinal stenosis of lumbar region with neurogenic claudication 12/28/2018  . It band syndrome, left 09/06/2018  . Acute left-sided low back pain without sciatica 07/18/2018  . Osteoarthritis of right hip 06/03/2018  . Chronic right  shoulder pain 05/05/2018  . Right hip pain 05/05/2018  . Healthcare maintenance 05/05/2018  . Rupture of anterior cruciate ligament of right knee 02/15/2018  . Sprain of medial collateral ligament of right knee 02/15/2018  . Essential hypertension 02/15/2018   Past Medical History:  Diagnosis Date  . Arthritis    right knee, lower back  . Dyspnea    very rare -tx with albuterol neb sol if needed  . Hyperlipidemia   . Hypertension   . Pre-diabetes    per patient - does not check blood sugar  . SVD (spontaneous vaginal delivery)    x 5 - only 2 living, 1 stillborn and 1 premature at 7 months demise,1 child deceased  . Wears dentures    upper and lower    No family history on file.  Past Surgical History:  Procedure Laterality Date  . LUMBAR FUSION  05/20/2019   GILL PROCEDURE, LEFT TRANSFORAMINAL LUMBAR INTERBODY FUSION, PEDICLE INSTRUMENTATION, BILATERAL FUSION (N/A  . TUBAL LIGATION     Social History   Occupational History  . Not on file  Tobacco Use  . Smoking status: Former Smoker    Packs/day: 1.00    Years: 30.00    Pack years: 30.00    Types: Cigarettes    Quit date: 01/2015    Years since quitting: 5.0  . Smokeless tobacco: Never Used  Substance and Sexual Activity  . Alcohol use: Not Currently  . Drug use: Yes    Types: Marijuana    Comment: last use Tuesday, 05/17/19  . Sexual activity: Not on file

## 2020-03-07 ENCOUNTER — Ambulatory Visit (INDEPENDENT_AMBULATORY_CARE_PROVIDER_SITE_OTHER): Payer: Medicaid Other | Admitting: Physician Assistant

## 2020-03-07 ENCOUNTER — Encounter: Payer: Self-pay | Admitting: Physician Assistant

## 2020-03-07 ENCOUNTER — Other Ambulatory Visit: Payer: Self-pay

## 2020-03-07 VITALS — Ht 64.0 in | Wt 213.0 lb

## 2020-03-07 DIAGNOSIS — M5136 Other intervertebral disc degeneration, lumbar region: Secondary | ICD-10-CM | POA: Diagnosis not present

## 2020-03-07 NOTE — Progress Notes (Signed)
Office Visit Note   Patient: Sandra Lozano           Date of Birth: Aug 25, 1961           MRN: [id number] Visit Date: 03/07/2020              Requested by: Guadalupe Dawn, MD [phone] [place] [address],  [place] [address] PCP: Guadalupe Dawn, MD  Chief Complaint  Patient presents with  . Right Foot - Pain  . Left Foot - Pain      HPI: This is a pleasant woman who follows up today for her bilateral foot pain.  At her last visit she was given a steroid pack which helped her somewhat.  She also had some paravertebral muscle pain.  She said that is actually still continues to bother her.  She is status post lumbar fusion with Dr. Lorin Mercy last summer   Assessment & Plan: Visit Diagnoses: No diagnosis found.  Plan: She is asked me about core strengthening and back stabilization exercises.  Because she is recently had surgery with Dr. Lorin Mercy I have asked if she could visit with him first to be sure there is nothing else he recommends.  She may follow-up with [place] as needed  Follow-Up Instructions: No follow-ups on file.   Ortho Exam  Patient is alert, oriented, no adenopathy, well-dressed, normal affect, normal respiratory effort. Focused examination demonstrates tenderness over the paravertebral muscles.  She does not have any radicular findings today.  Pain in her feet is actually improved Imaging: No results found. No images are attached to the encounter.  Labs: Lab Results  Component Value Date   HGBA1C 6.3 11/01/2019     Lab Results  Component Value Date   ALBUMIN 4.0 05/18/2019    No results found for: MG No results found for: VD25OH  No results found for: PREALBUMIN CBC EXTENDED Latest Ref Rng & Units 05/21/2019 05/18/2019  WBC 4.0 - 10.5 K/uL 11.4(H) 10.5  RBC 3.87 - 5.11 MIL/uL 3.35(L) 4.78  HGB 12.0 - 15.0 g/dL 9.3(L) 13.3  HCT 36.0 - 46.0 % 28.1(L) 41.2  PLT 150 - 400 K/uL 284 356     Body mass index is 36.56 kg/m.  Orders:  No orders of the defined types  were placed in this encounter.  No orders of the defined types were placed in this encounter.    Procedures: No procedures performed  Clinical Data: No additional findings.  ROS:  All other systems negative, except as noted in the HPI. Review of Systems  Objective: Vital Signs: Ht 5\' 4"  (1.626 m)   Wt 213 lb (96.6 kg)   LMP  (LMP Unknown)   BMI 36.56 kg/m   Specialty Comments:  No specialty comments available.  PMFS History: Patient Active Problem List   Diagnosis Date Noted  . Neuropathy 11/07/2019  . Pre-diabetes 11/07/2019  . Nummular eczema 07/06/2019  . Lumbar stenosis 05/20/2019  . Spinal instability, lumbar 01/18/2019  . Spinal stenosis of lumbar region with neurogenic claudication 12/28/2018  . It band syndrome, left 09/06/2018  . Acute left-sided low back pain without sciatica 07/18/2018  . Osteoarthritis of right hip 06/03/2018  . Chronic right shoulder pain 05/05/2018  . Right hip pain 05/05/2018  . Healthcare maintenance 05/05/2018  . Rupture of anterior cruciate ligament of right knee 02/15/2018  . Sprain of medial collateral ligament of right knee 02/15/2018  . Essential hypertension 02/15/2018   Past Medical History:  Diagnosis Date  . Arthritis  right knee, lower back  . Dyspnea    very rare -tx with albuterol neb sol if needed  . Hyperlipidemia   . Hypertension   . Pre-diabetes    per patient - does not check blood sugar  . SVD (spontaneous vaginal delivery)    x 5 - only 2 living, 1 stillborn and 1 premature at 7 months demise,1 child deceased  . Wears dentures    upper and lower    No family history on file.  Past Surgical History:  Procedure Laterality Date  . LUMBAR FUSION  05/20/2019   GILL PROCEDURE, LEFT TRANSFORAMINAL LUMBAR INTERBODY FUSION, PEDICLE INSTRUMENTATION, BILATERAL FUSION (N/A  . TUBAL LIGATION     Social History   Occupational History  . Not on file  Tobacco Use  . Smoking status: Former Smoker     Packs/day: 1.00    Years: 30.00    Pack years: 30.00    Types: Cigarettes    Quit date: 01/2015    Years since quitting: 5.1  . Smokeless tobacco: Never Used  Substance and Sexual Activity  . Alcohol use: Not Currently  . Drug use: Yes    Types: Marijuana    Comment: last use Tuesday, 05/17/19  . Sexual activity: Not on file

## 2020-03-14 ENCOUNTER — Ambulatory Visit (INDEPENDENT_AMBULATORY_CARE_PROVIDER_SITE_OTHER): Payer: Medicaid Other | Admitting: Orthopaedic Surgery

## 2020-03-14 ENCOUNTER — Ambulatory Visit (INDEPENDENT_AMBULATORY_CARE_PROVIDER_SITE_OTHER): Payer: Medicaid Other

## 2020-03-14 ENCOUNTER — Other Ambulatory Visit: Payer: Self-pay

## 2020-03-14 ENCOUNTER — Encounter: Payer: Self-pay | Admitting: Orthopaedic Surgery

## 2020-03-14 VITALS — BP 150/67 | HR 68 | Ht 64.0 in | Wt 205.0 lb

## 2020-03-14 DIAGNOSIS — M545 Low back pain, unspecified: Secondary | ICD-10-CM

## 2020-03-14 DIAGNOSIS — Z981 Arthrodesis status: Secondary | ICD-10-CM | POA: Diagnosis not present

## 2020-03-14 DIAGNOSIS — M19079 Primary osteoarthritis, unspecified ankle and foot: Secondary | ICD-10-CM

## 2020-03-14 DIAGNOSIS — G8929 Other chronic pain: Secondary | ICD-10-CM | POA: Diagnosis not present

## 2020-03-14 NOTE — Progress Notes (Signed)
Office Visit Note   Patient: Sandra Lozano           Date of Birth: 04-Jan-1961           MRN: [id number] Visit Date: 03/14/2020              Requested by: Myrene Buddy, MD [phone] [address] [place],  [place] [address] PCP: Myrene Buddy, MD   Assessment & Plan: Visit Diagnoses:  1. Chronic midline low back pain, unspecified whether sciatica present   2. Arthritis of midfoot   3. History of lumbar fusion     Plan: Patient has midfoot arthritis left foot greater than right foot and needs to make sure she is wearing shoes that have good arch support.  We discussed super feet insert use of the blue or burgundy colored that she can swap from shoe to shoe to help support the midfoot.  This should give her less pain should be able to walk more.  She is wanting to increase her activity level to help with weight loss, balance and lower extremity strength since her surgery last year.  She is happy with the surgical result lumbar fusion follow-up as needed.  Follow-Up Instructions: No follow-ups on file.   Orders:  Orders Placed This Encounter  Procedures  . XR Lumbar Spine 2-3 Views   No orders of the defined types were placed in this encounter.     Procedures: No procedures performed   Clinical Data: No additional findings.   Subjective: Chief Complaint  Patient presents with  . Lower Back - Pain    HPI 59 year old female returns she had problems with midfoot swelling and pain.  She saw Dr. Audrie Lia PA Clerance Lav who placed her on some prednisone.  Patient states her foot got better swelling went down but now she has had problems with the diarrhea and stopped the medicine.  She states she is walking better but notes the prominent spurs on the dorsum of her foot where she had the pain.  More symptoms on the left than right.  Patient had L4-5 single level fusion 05/20/2019 and states her claudication symptoms are doing well.  Review of Systems unchanged from last office  visit.   Objective: Vital Signs: BP (!) 150/67   Pulse 68   Ht 5\' 4"  (1.626 m)   Wt 205 lb (93 kg)   LMP  (LMP Unknown)   BMI 35.19 kg/m   Physical Exam Constitutional:      Appearance: She is well-developed.  HENT:     Head: Normocephalic.     Right Ear: External ear normal.     Left Ear: External ear normal.  Eyes:     Pupils: Pupils are equal, round, and reactive to light.  Neck:     Thyroid: No thyromegaly.     Trachea: No tracheal deviation.  Cardiovascular:     Rate and Rhythm: Normal rate.  Pulmonary:     Effort: Pulmonary effort is normal.  Abdominal:     Palpations: Abdomen is soft.  Skin:    General: Skin is warm and dry.  Neurological:     Mental Status: She is alert and oriented to person, place, and time.  Psychiatric:        Behavior: Behavior normal.     Ortho Exam patient has prominence with dorsal spurring left foot greater than right foot at the tarsometatarsal joint and midtarsal joint dorsally.  Moderate tenderness at the tarsometatarsal joint left greater than right.  Plantar  fascia is intact.  Subtalar motion is normal.  Good ankle range of motion.  Lumbar incisions well-healed negative straight leg raising 90 degrees.  Specialty Comments:  No specialty comments available.  Imaging: XR Lumbar Spine 2-3 Views  Result Date: 03/14/2020 AP lateral lumbar spine x-rays show L4-5 instrumented fusion left T left with bone incorporation across the space.  Screws and cage are in good position. Impression: Single level L4-5 fusion with good incorporation.    PMFS History: Patient Active Problem List   Diagnosis Date Noted  . Arthritis of midfoot 03/14/2020  . History of lumbar fusion 03/14/2020  . Neuropathy 11/07/2019  . Pre-diabetes 11/07/2019  . Nummular eczema 07/06/2019  . Lumbar stenosis 05/20/2019  . It band syndrome, left 09/06/2018  . Acute left-sided low back pain without sciatica 07/18/2018  . Osteoarthritis of right hip 06/03/2018   . Chronic right shoulder pain 05/05/2018  . Right hip pain 05/05/2018  . Healthcare maintenance 05/05/2018  . Rupture of anterior cruciate ligament of right knee 02/15/2018  . Sprain of medial collateral ligament of right knee 02/15/2018  . Essential hypertension 02/15/2018   Past Medical History:  Diagnosis Date  . Arthritis    right knee, lower back  . Dyspnea    very rare -tx with albuterol neb sol if needed  . Hyperlipidemia   . Hypertension   . Pre-diabetes    per patient - does not check blood sugar  . SVD (spontaneous vaginal delivery)    x 5 - only 2 living, 1 stillborn and 1 premature at 7 months demise,1 child deceased  . Wears dentures    upper and lower    No family history on file.  Past Surgical History:  Procedure Laterality Date  . LUMBAR FUSION  05/20/2019   GILL PROCEDURE, LEFT TRANSFORAMINAL LUMBAR INTERBODY FUSION, PEDICLE INSTRUMENTATION, BILATERAL FUSION (N/A  . TUBAL LIGATION     Social History   Occupational History  . Not on file  Tobacco Use  . Smoking status: Former Smoker    Packs/day: 1.00    Years: 30.00    Pack years: 30.00    Types: Cigarettes    Quit date: 01/2015    Years since quitting: 5.1  . Smokeless tobacco: Never Used  Substance and Sexual Activity  . Alcohol use: Not Currently  . Drug use: Yes    Types: Marijuana    Comment: last use Tuesday, 05/17/19  . Sexual activity: Not on file

## 2020-03-28 ENCOUNTER — Encounter: Payer: Self-pay | Admitting: Orthopaedic Surgery

## 2020-04-12 ENCOUNTER — Telehealth: Payer: Self-pay | Admitting: Family Medicine

## 2020-04-12 NOTE — Telephone Encounter (Signed)
Pt requesting a referral for a mammogram pls call [phone]

## 2020-04-13 ENCOUNTER — Other Ambulatory Visit: Payer: Self-pay | Admitting: Family Medicine

## 2020-04-13 DIAGNOSIS — Z0000 Encounter for general adult medical examination without abnormal findings: Secondary | ICD-10-CM

## 2020-04-13 NOTE — Progress Notes (Signed)
Placed order for mammogram. She can call [phone] to schedule it.  Myrene Buddy MD PGY-3 Family Medicine Resident

## 2020-04-13 NOTE — Telephone Encounter (Signed)
Placed order for mammogram. The patient can call [phone] to schedule.  Myrene Buddy MD PGY-3 Family Medicine Resident

## 2020-04-18 ENCOUNTER — Other Ambulatory Visit: Payer: Self-pay | Admitting: Family Medicine

## 2020-04-18 DIAGNOSIS — Z1231 Encounter for screening mammogram for malignant neoplasm of breast: Secondary | ICD-10-CM

## 2020-04-27 ENCOUNTER — Ambulatory Visit
Admission: RE | Admit: 2020-04-27 | Discharge: 2020-04-27 | Disposition: A | Discharge status: Home / Self Care | Payer: Medicaid Other | Source: Ambulatory Visit | Attending: Family Medicine | Admitting: Family Medicine

## 2020-04-27 DIAGNOSIS — Z1231 Encounter for screening mammogram for malignant neoplasm of breast: Secondary | ICD-10-CM

## 2020-06-03 ENCOUNTER — Encounter (HOSPITAL_COMMUNITY): Payer: Self-pay | Admitting: Emergency Medicine

## 2020-06-03 ENCOUNTER — Emergency Department (HOSPITAL_COMMUNITY)
Admission: EM | Admit: 2020-06-03 | Discharge: 2020-06-04 | Disposition: A | Discharge status: Home / Self Care | Payer: Medicaid Other | Attending: Emergency Medicine | Admitting: Emergency Medicine

## 2020-06-03 ENCOUNTER — Other Ambulatory Visit: Payer: Self-pay

## 2020-06-03 DIAGNOSIS — F1210 Cannabis abuse, uncomplicated: Secondary | ICD-10-CM | POA: Insufficient documentation

## 2020-06-03 DIAGNOSIS — Z7984 Long term (current) use of oral hypoglycemic drugs: Secondary | ICD-10-CM | POA: Insufficient documentation

## 2020-06-03 DIAGNOSIS — Z79899 Other long term (current) drug therapy: Secondary | ICD-10-CM | POA: Insufficient documentation

## 2020-06-03 DIAGNOSIS — I10 Essential (primary) hypertension: Secondary | ICD-10-CM | POA: Diagnosis not present

## 2020-06-03 DIAGNOSIS — M546 Pain in thoracic spine: Secondary | ICD-10-CM | POA: Insufficient documentation

## 2020-06-03 DIAGNOSIS — Z87891 Personal history of nicotine dependence: Secondary | ICD-10-CM | POA: Diagnosis not present

## 2020-06-03 DIAGNOSIS — M549 Dorsalgia, unspecified: Secondary | ICD-10-CM | POA: Diagnosis present

## 2020-06-03 NOTE — ED Triage Notes (Signed)
Patient reports left low back pain onset Thursday , patient suspects muscle strain from working out , denies injury , no hematuria or dysuria , pain increases with movement .

## 2020-06-04 ENCOUNTER — Ambulatory Visit: Payer: Medicaid Other

## 2020-06-04 ENCOUNTER — Other Ambulatory Visit: Payer: Self-pay

## 2020-06-04 ENCOUNTER — Ambulatory Visit (INDEPENDENT_AMBULATORY_CARE_PROVIDER_SITE_OTHER): Payer: Medicaid Other | Admitting: Family Medicine

## 2020-06-04 VITALS — BP 150/82 | HR 76 | Ht 64.0 in | Wt 208.0 lb

## 2020-06-04 DIAGNOSIS — L280 Lichen simplex chronicus: Secondary | ICD-10-CM | POA: Insufficient documentation

## 2020-06-04 DIAGNOSIS — L989 Disorder of the skin and subcutaneous tissue, unspecified: Secondary | ICD-10-CM | POA: Diagnosis not present

## 2020-06-04 MED ORDER — LIDOCAINE 5 % EX PTCH
1.0000 | MEDICATED_PATCH | CUTANEOUS | Status: DC
  Administered 2020-06-04: 1 via TRANSDERMAL
  Filled 2020-06-04: qty 1

## 2020-06-04 MED ORDER — TIZANIDINE HCL 4 MG PO TABS
4.0000 mg | ORAL_TABLET | Freq: Two times a day (BID) | ORAL | 0 refills | Status: DC | PRN
Start: 2020-06-04 — End: 2020-08-22

## 2020-06-04 MED ORDER — FENTANYL CITRATE (PF) 100 MCG/2ML IJ SOLN
50.0000 ug | Freq: Once | INTRAMUSCULAR | Status: AC
  Administered 2020-06-04: 50 ug via INTRAMUSCULAR
  Filled 2020-06-04: qty 2

## 2020-06-04 MED ORDER — LIDOCAINE 5 % EX PTCH: 1.0000 | MEDICATED_PATCH | CUTANEOUS | 0 refills | Status: DC

## 2020-06-04 MED ORDER — FENTANYL CITRATE (PF) 100 MCG/2ML IJ SOLN: 50.0000 ug | Freq: Once | INTRAMUSCULAR | Status: DC

## 2020-06-04 MED ORDER — NAPROXEN 500 MG PO TABS
500.0000 mg | ORAL_TABLET | Freq: Two times a day (BID) | ORAL | 0 refills | Status: DC
Start: 2020-06-04 — End: 2020-08-22

## 2020-06-04 MED ORDER — TIZANIDINE HCL 4 MG PO TABS
4.0000 mg | ORAL_TABLET | Freq: Once | ORAL | Status: AC
  Administered 2020-06-04: 4 mg via ORAL
  Filled 2020-06-04: qty 1

## 2020-06-04 NOTE — Assessment & Plan Note (Signed)
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  No red flag symptoms to indicate need for biopsy at this time.  Recommended patient continue to monitor and return if begins to change in size or starts to bleed.  She may treat with topical steroid as needed for itch.  Patient voiced understanding and agreement with plan.

## 2020-06-04 NOTE — ED Provider Notes (Signed)
[facility] EMERGENCY DEPARTMENT Provider Note   CSN: [id number] Arrival date & time: 06/03/20  2139     History Chief Complaint  Patient presents with  . Back Pain    Sandra Lozano is a 59 y.o. female presenting for evaluation of back pain.  Patient states 3 days ago she was at the gym using a stretching/polyp machine when she started to feel discomfort in her left back.  Since then, pain has gradually worsened.  Pain is constant, worse with movement, specifically twisting. Also hurts worse with inspiration. It mostly stays in her back, but occasionally radiates to her leg and around her side.  She denies associated fevers, chills, chest pain, shortness of breath, cough, nausea, vomiting abdominal pain, urinary symptoms, abnormal bowel movements.  She has not taken anything for it including Tylenol or ibuprofen, stating they do not work.  She has tried leftover Robaxin without improvement of symptoms.  She reports a history of hypertension, diabetes, hyperlipidemia, no other medical problems.  She denies recent travel, surgeries, immobilization, history of cancer, history of previous DVT/PE, or hormone use.  HPI     Past Medical History:  Diagnosis Date  . Arthritis    right knee, lower back  . Dyspnea    very rare -tx with albuterol neb sol if needed  . Hyperlipidemia   . Hypertension   . Pre-diabetes    per patient - does not check blood sugar  . SVD (spontaneous vaginal delivery)    x 5 - only 2 living, 1 stillborn and 1 premature at 7 months demise,1 child deceased  . Wears dentures    upper and lower    Patient Active Problem List   Diagnosis Date Noted  . Arthritis of midfoot 03/14/2020  . History of lumbar fusion 03/14/2020  . Neuropathy 11/07/2019  . Pre-diabetes 11/07/2019  . Nummular eczema 07/06/2019  . Lumbar stenosis 05/20/2019  . It band syndrome, left 09/06/2018  . Acute left-sided low back pain without sciatica 07/18/2018  .  Osteoarthritis of right hip 06/03/2018  . Chronic right shoulder pain 05/05/2018  . Right hip pain 05/05/2018  . Healthcare maintenance 05/05/2018  . Rupture of anterior cruciate ligament of right knee 02/15/2018  . Sprain of medial collateral ligament of right knee 02/15/2018  . Essential hypertension 02/15/2018    Past Surgical History:  Procedure Laterality Date  . LUMBAR FUSION  05/20/2019   GILL PROCEDURE, LEFT TRANSFORAMINAL LUMBAR INTERBODY FUSION, PEDICLE INSTRUMENTATION, BILATERAL FUSION (N/A  . TUBAL LIGATION       OB History   No obstetric history on file.     No family history on file.  Social History   Tobacco Use  . Smoking status: Former Smoker    Packs/day: 1.00    Years: 30.00    Pack years: 30.00    Types: Cigarettes    Quit date: 01/2015    Years since quitting: 5.3  . Smokeless tobacco: Never Used  Vaping Use  . Vaping Use: Never used  Substance Use Topics  . Alcohol use: Not Currently  . Drug use: Yes    Types: Marijuana    Comment: last use Tuesday, 05/17/19    Home Medications Prior to Admission medications   Medication Sig Start Date End Date Taking? Authorizing Provider  atorvastatin (LIPITOR) 20 MG tablet Take 1 tablet by mouth daily 01/19/20  Yes Myrene Buddy, MD  gabapentin (NEURONTIN) 300 MG capsule Take 1 capsule (300 mg total) by mouth 3 (three)  times daily. 1 by mouth in the evening x3 days the 1 PO BID x 3 days then increase to 1 po TID if tolerated Patient taking differently: Take 300 mg by mouth daily as needed (Nerve pain).  02/15/20  Yes Persons, Bevely Palmer, PA  lisinopril-hydrochlorothiazide (ZESTORETIC) 20-12.5 MG tablet Take 1 tablet by mouth daily. 11/01/19  Yes Guadalupe Dawn, MD  meloxicam (MOBIC) 7.5 MG tablet Take 1 tablet by mouth once daily Patient taking differently: Take 7.5 mg by mouth daily.  04/13/20  Yes Guadalupe Dawn, MD  metFORMIN (GLUCOPHAGE) 500 MG tablet Take 1 tablet by mouth once daily with  breakfast Patient taking differently: Take 500 mg by mouth daily with breakfast.  04/13/20  Yes Guadalupe Dawn, MD  methocarbamol (ROBAXIN) 500 MG tablet Take 1 tablet (500 mg total) by mouth every 6 (six) hours as needed for muscle spasms. 02/15/20  Yes Persons, Bevely Palmer, PA  lidocaine (LIDODERM) 5 % Place 1 patch onto the skin daily. Remove & Discard patch within 12 hours or as directed by MD 06/04/20   Marshaun Lortie, PA-C  naproxen (NAPROSYN) 500 MG tablet Take 1 tablet (500 mg total) by mouth 2 (two) times daily with a meal. 06/04/20   Jory Tanguma, PA-C  predniSONE (DELTASONE) 10 MG tablet Take 1 tablet (10 mg total) by mouth daily with breakfast. Patient not taking: Reported on 06/04/2020 01/25/20   Persons, Bevely Palmer, PA  tiZANidine (ZANAFLEX) 4 MG tablet Take 1 tablet (4 mg total) by mouth 2 (two) times daily as needed for muscle spasms. 06/04/20   Reford Olliff, PA-C    Allergies    Patient has no known allergies.  Review of Systems   Review of Systems  Musculoskeletal: Positive for back pain.    Physical Exam Updated Vital Signs BP (!) 169/78   Pulse 67   Temp 97.6 F (36.4 C) (Oral)   Resp 18   Ht 5\' 4"  (1.626 m)   Wt 105 kg   LMP  (LMP Unknown)   SpO2 100%   BMI 39.73 kg/m   Physical Exam Vitals and nursing note reviewed.  Constitutional:      General: She is not in acute distress.    Appearance: She is well-developed.     Comments: Resting in the bed in no acute distress  HENT:     Head: Normocephalic and atraumatic.  Eyes:     Conjunctiva/sclera: Conjunctivae normal.     Pupils: Pupils are equal, round, and reactive to light.  Cardiovascular:     Rate and Rhythm: Normal rate and regular rhythm.     Pulses: Normal pulses.  Pulmonary:     Effort: Pulmonary effort is normal. No respiratory distress.     Breath sounds: Normal breath sounds. No wheezing.     Comments: Clear lung sounds in all fields.  Speaking full sentences.  No tachypnea Abdominal:      General: There is no distension.     Palpations: Abdomen is soft. There is no mass.     Tenderness: There is no abdominal tenderness. There is no guarding or rebound.  Musculoskeletal:        General: Normal range of motion.     Cervical back: Normal range of motion and neck supple.       Back:     Right lower leg: No edema.     Left lower leg: No edema.     Comments: Tenderness palpation of left side back and lateral thorax.  No  rash.  No tenderness palpation over midline spine.  No step-offs or deformities.  Skin:    General: Skin is warm and dry.     Capillary Refill: Capillary refill takes less than 2 seconds.  Neurological:     Mental Status: She is alert and oriented to person, place, and time.     ED Results / Procedures / Treatments   Labs (all labs ordered are listed, but only abnormal results are displayed) Labs Reviewed - No data to display  EKG None  Radiology No results found.  Procedures Procedures (including critical care time)  Medications Ordered in ED Medications  lidocaine (LIDODERM) 5 % 1 patch (1 patch Transdermal Patch Applied 06/04/20 0239)  tiZANidine (ZANAFLEX) tablet 4 mg (4 mg Oral Given 06/04/20 0239)  fentaNYL (SUBLIMAZE) injection 50 mcg (50 mcg Intramuscular Given 06/04/20 0345)    ED Course  I have reviewed the triage vital signs and the nursing notes.  Pertinent labs & imaging results that were available during my care of the patient were reviewed by me and considered in my medical decision making (see chart for details).    MDM Rules/Calculators/A&P                          Patient presented for evaluation of back pain.  On exam, patient peers nontoxic.  Pain is reproducible.  Worse with movement, likely MSK.  However, patient also has increased pain with inspiration.  Consider PE, though patient has no risk factors.  She is not tachycardic, tachypneic, or hypoxic.  As pain is worse with movement and radiates to the lateral thorax,  and occasionally to the leg, low suspicion for renal cause.  Without fever cough, doubt pneumonia.  Discussed with patient.  Discussed likely MSK pain, however possibility of other causes.  Offered investigation to rule out PE and renal problems including blood work and urine versus treating for MSK symptoms and having patient follow-up with PCP.  Patient elects for symptomatic treatment, and to follow-up with PCP.  As she has no risk factors and is well-appearing, I am agreeable with this plan.  Will give lidocaine patch, fentanyl, and tizanidine and reassess.  On reassessment, patient reports pain is improved.  Discussed continued symptomatic treatment at home, and close follow-up with PCP.  Discussed prompt return to the ER if she develops fever, cough, shortness of breath, urinary symptoms.  At this time, patient appears safe for discharge.  Return precautions given.  Patient states she understands and agrees to plan.  Final Clinical Impression(s) / ED Diagnoses Final diagnoses:  Acute left-sided thoracic back pain    Rx / DC Orders ED Discharge Orders         Ordered    lidocaine (LIDODERM) 5 %  Every 24 hours     Discontinue  Reprint     06/04/20 0403    naproxen (NAPROSYN) 500 MG tablet  2 times daily with meals     Discontinue  Reprint     06/04/20 0403    tiZANidine (ZANAFLEX) 4 MG tablet  2 times daily PRN     Discontinue  Reprint     06/04/20 0403           Dynisha Due, PA-C 06/04/20 0406    Dione Booze, MD 06/04/20 [phone]

## 2020-06-04 NOTE — Discharge Instructions (Signed)
Take naproxen 2 times a day with meals.  Do not take other anti-inflammatories at the same time (Advil, Motrin, ibuprofen, Aleve). You may supplement with Tylenol if you need further pain control. Use tizanidine as needed for muscle stiffness or soreness. Have caution, as this may make you tired or groggy. Do not driver or operate heavy machinery while taking this medication.  Use the lidocaine patch to help with pain control. Follow up with your primary care doctor if pain is not improving.  Return to the ER if you develop high fevers, cough, difficulty breathing, difficulty urinating, or with any new or concerning symptoms.

## 2020-06-04 NOTE — Progress Notes (Signed)
   Subjective:   Patient ID: Sandra Lozano    DOB: [birth year], 60 y.o. female   MRN: [id number]  Sandra Lozano is a 59 y.o. female with a history of hypertension, neuropathy, osteoarthritis of the knees, nummular eczema, arthritis, chronic right shoulder pain, history of left-sided low back pain without sciatica/lumbar stenosis/history of lumbar fusion, prediabetes here for back pain.  Spot on Scalp: Spot on scalp x >6 months.  She notes that she first noticed it when she started please care and a small patch on her scalp.  She notes that it has not changed in size but it does flareup occasionally which causes it to become raised slightly and itches.  She is treated it with topical natural oils with some improvement.  Denies change in size or bleeding.  Denies any family or personal history of skin cancers.   Review of Systems:  Per HPI.   Objective:   BP (!) 150/82   Pulse 76   Ht 5\' 4"  (1.626 m)   Wt 208 lb (94.3 kg)   LMP  (LMP Unknown)   SpO2 97%   BMI 35.70 kg/m  Vitals and nursing note reviewed.  General: Pleasant older female, sitting comfortably in exam chair, well nourished, well developed, in no acute distress with non-toxic appearance Resp: Speaking in full sentences, breathing comfortably on room air Skin: warm, dry, Skin colored papule of 77mm x 44mm on right lateral parietal scalp, with underlying hair follicle, no bleeding, erythema, drainage. 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  No red flag symptoms to indicate need for biopsy at this time.  Recommended patient continue to monitor and return if begins to change in size or starts to bleed.  She may treat with topical steroid as needed for itch.   Patient voiced understanding and agreement with plan.  Left Sided Thoracic Back pain: Patient initially made appointment for left-sided back pain that occurred when she was working out at the gym. She notes that the pain became more severe thus opted to be evaluated at the emergency department yesterday which she notes has helped significantly. Denies any saddle anesthesia, fecal/urinary incontinence. She no longer feels the need to be evaluated for her back at this time. In the ED she was given a lidocaine patch, Zanaflex and fentanyl x1. She was discharged with naproxen, lidocaine patch, and tizanidine. Today show notes the treatment provided by the ED is helping. She has opted to continue this conservative treatment and follow up if no improvement in 4-6 weeks or sooner if worsening.  1m, DO PGY-2, [facility] 06/04/2020 9:38 PM

## 2020-06-04 NOTE — Patient Instructions (Signed)
Thank you for coming to see me today. It was a pleasure to see you.   At this time I believe the spot on your scalp may be an inflamed hair follicle or a seborrheic keratosis (wisdom spot).  At this time I feel we can continue to monitor.  You may treat with steroid cream as needed for itch otherwise I do not recommend any other treatments at this time.  If the area begins to grow in size, bleed, become more elevated I recommend that you come back for further evaluation.  I am sorry that your back is giving you trouble.  I am glad the ED provided you with some medications that may help.  Please follow-up in 4 to 6 weeks if no improvement for further evaluation.  If you have any questions or concerns, please do not hesitate to call the office at [phone].  Take Care,  Dr. Orpah Cobb, DO Resident Physician [facility] [phone]

## 2020-06-11 DIAGNOSIS — H40033 Anatomical narrow angle, bilateral: Secondary | ICD-10-CM | POA: Diagnosis not present

## 2020-06-11 DIAGNOSIS — H16223 Keratoconjunctivitis sicca, not specified as Sjogren's, bilateral: Secondary | ICD-10-CM | POA: Diagnosis not present

## 2020-06-12 DIAGNOSIS — H1013 Acute atopic conjunctivitis, bilateral: Secondary | ICD-10-CM | POA: Diagnosis not present

## 2020-06-13 DIAGNOSIS — H5213 Myopia, bilateral: Secondary | ICD-10-CM | POA: Diagnosis not present

## 2020-06-14 DIAGNOSIS — Z419 Encounter for procedure for purposes other than remedying health state, unspecified: Secondary | ICD-10-CM | POA: Diagnosis not present

## 2020-06-21 ENCOUNTER — Encounter: Payer: Self-pay | Admitting: Orthopaedic Surgery

## 2020-06-21 IMAGING — MG DIGITAL SCREENING BILAT W/ TOMO W/ CAD
8 series · 8 of 24 positions shown · non-contrast
Comparison: None.

CLINICAL DATA: Screening.

EXAM:
DIGITAL SCREENING BILATERAL MAMMOGRAM WITH TOMO AND CAD

[L CC synth-2D]
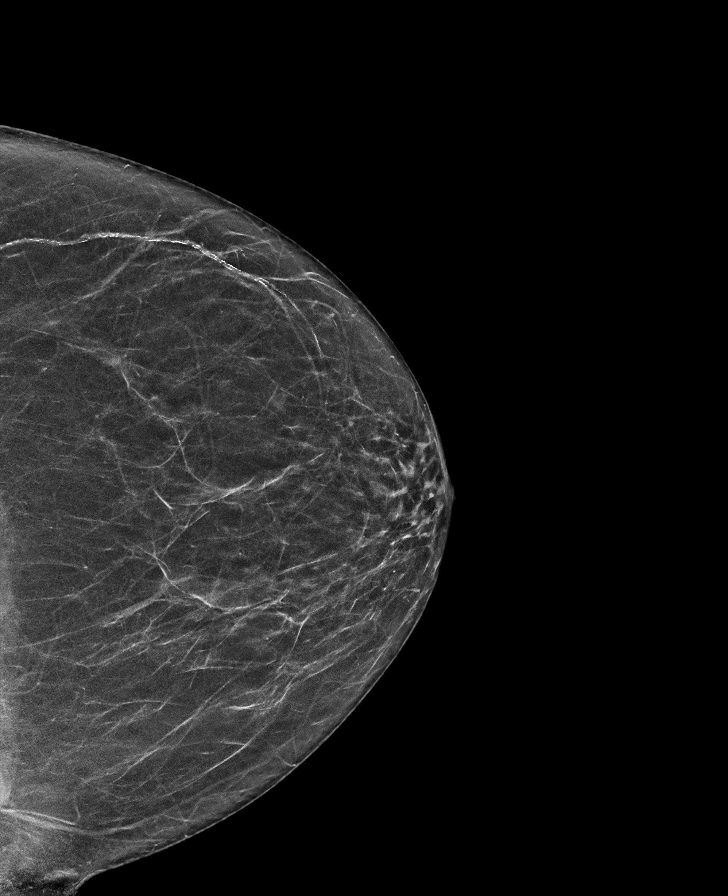

[R CC synth-2D]
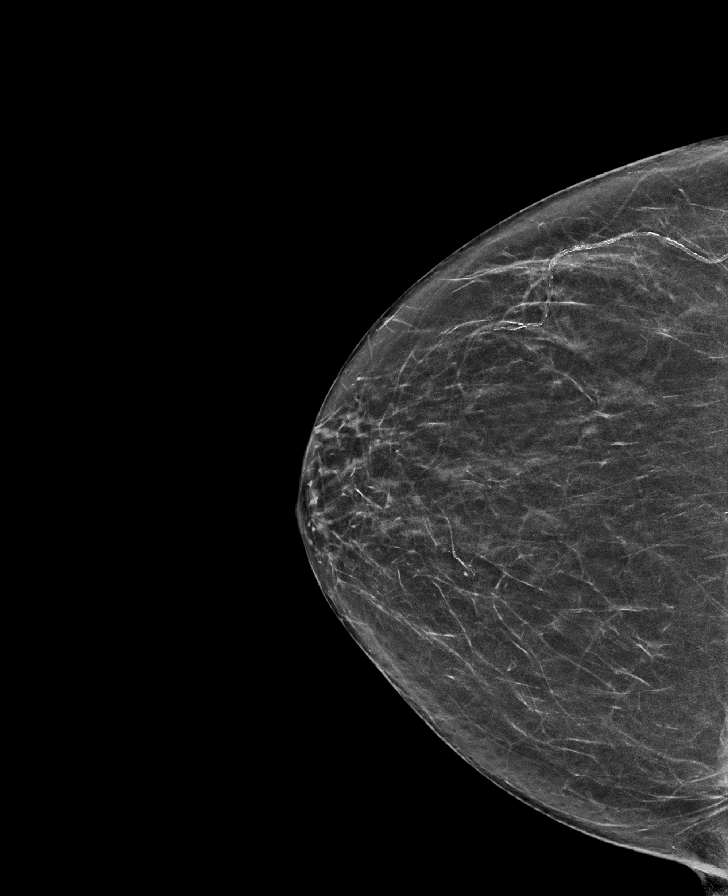

[L MLO synth-2D]
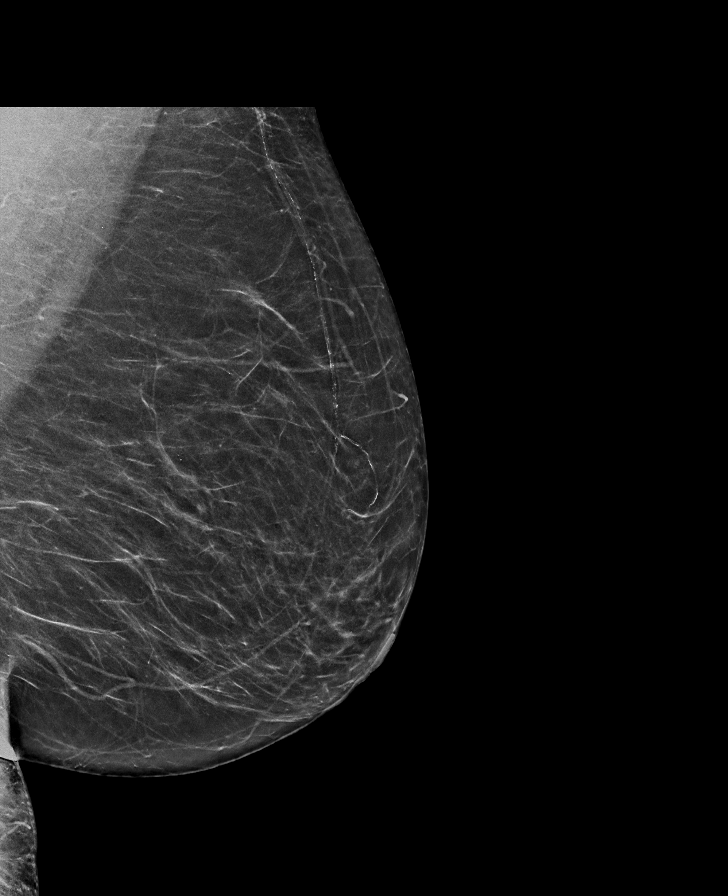

[R MLO synth-2D]
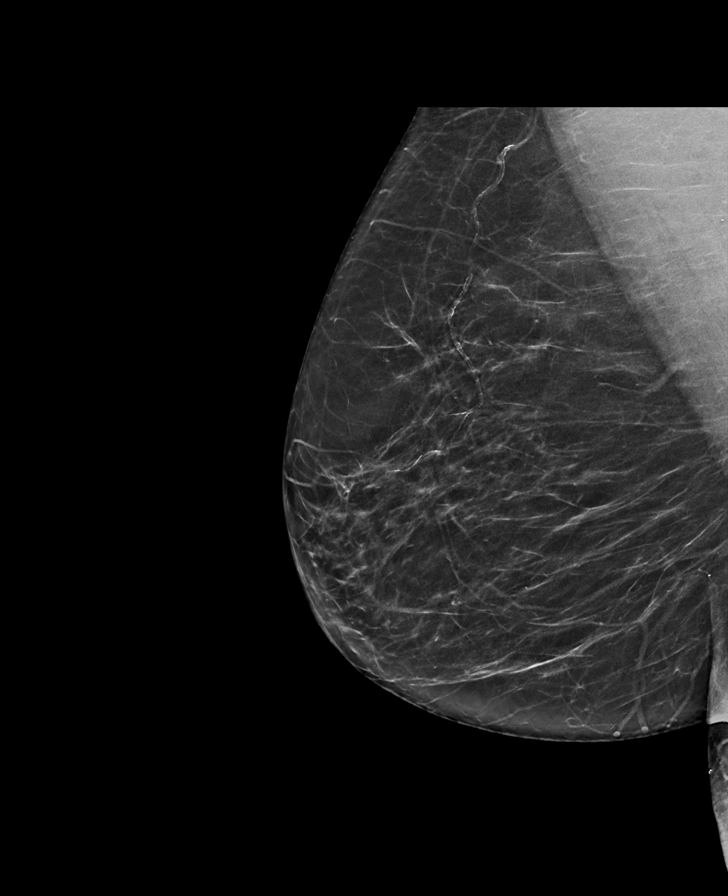

[R CC tomo · tomo slice 31/62.0]
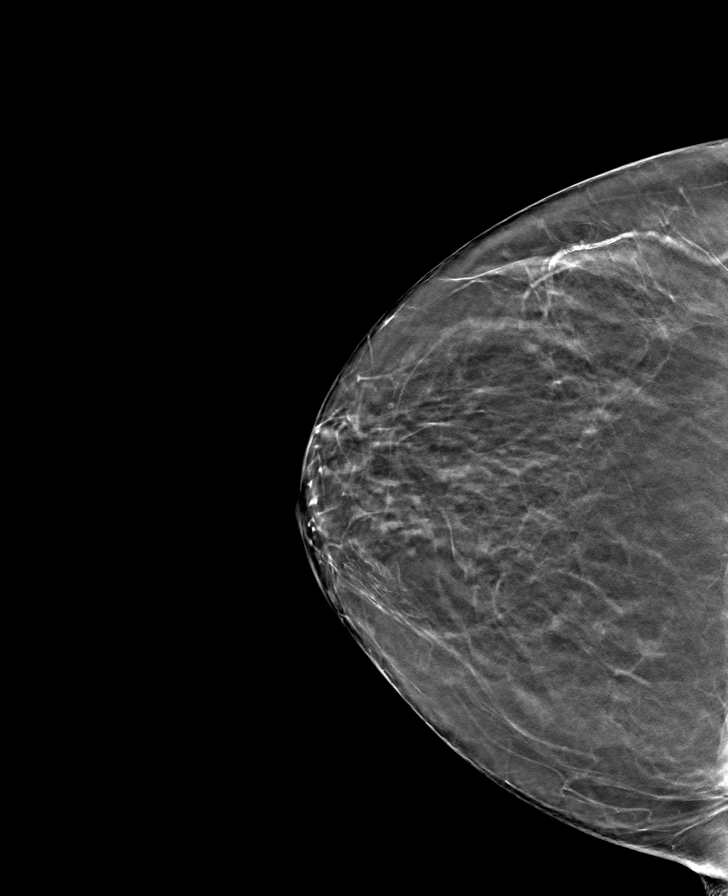

[L MLO tomo · tomo slice 35/70.0]
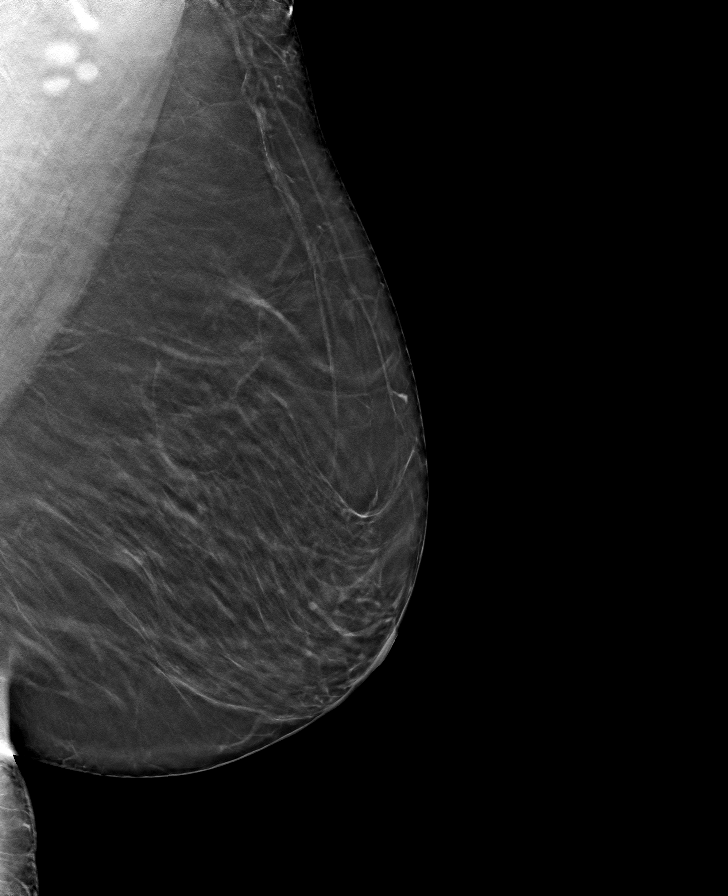

[R MLO tomo · tomo slice 34/67.0]
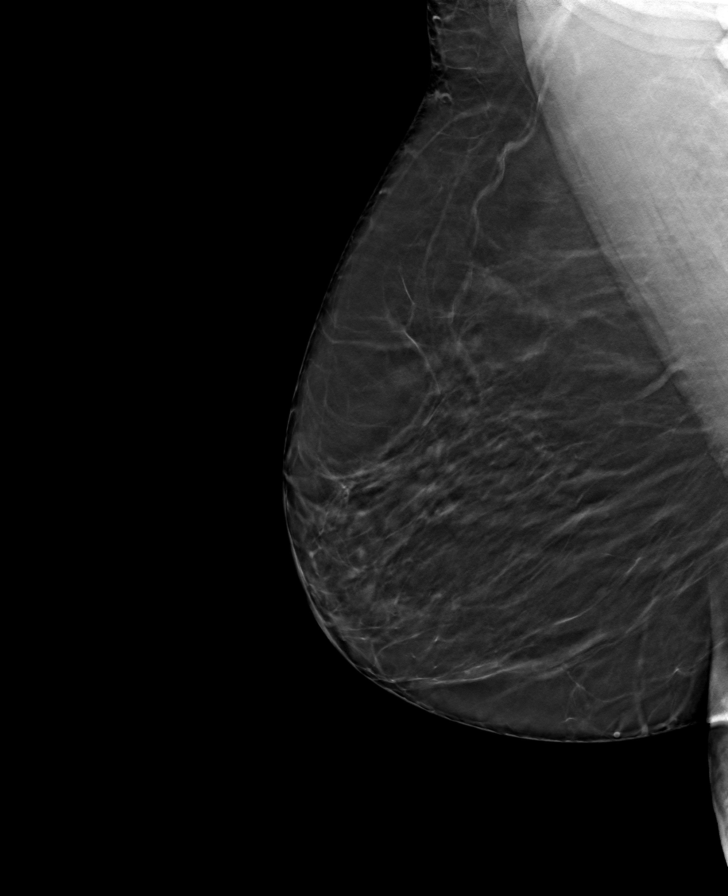

[L CC tomo · tomo slice 31/62.0]
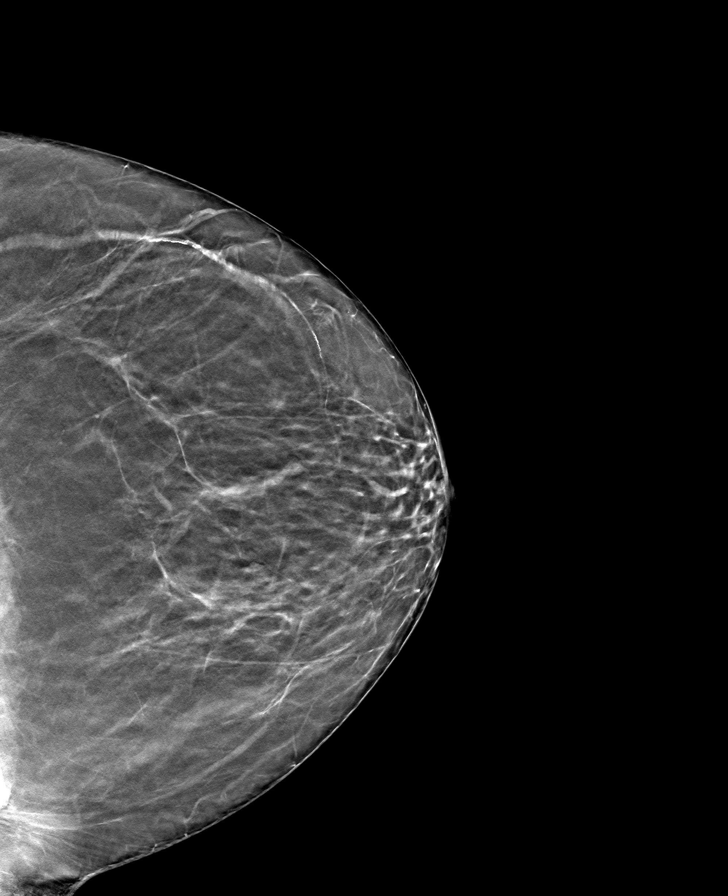

[8 of 24 positions shown; findings below may reference images not displayed]

ACR Breast Density Category b: There are scattered areas of
fibroglandular density.
FINDINGS: There are no findings suspicious for malignancy. Images were
processed with CAD.
IMPRESSION: No mammographic evidence of malignancy. A result letter of this
screening mammogram will be mailed directly to the patient.

RECOMMENDATION:
Screening mammogram in one year. (Code:[id number]-G-EJ6)

BI-RADS CATEGORY  1: Negative.

## 2020-06-21 MED ORDER — DICLOFENAC SODIUM 1 % EX GEL: 4.0000 g | Freq: Three times a day (TID) | CUTANEOUS | 2 refills | Status: DC | PRN

## 2020-07-04 ENCOUNTER — Encounter: Payer: Self-pay | Admitting: Orthopaedic Surgery

## 2020-07-15 DIAGNOSIS — Z419 Encounter for procedure for purposes other than remedying health state, unspecified: Secondary | ICD-10-CM | POA: Diagnosis not present

## 2020-07-31 ENCOUNTER — Telehealth: Payer: Self-pay

## 2020-07-31 NOTE — Telephone Encounter (Signed)
Received phone call from patient regarding positive COVID results. Patient states that she was tested at [facility] and received positive COVID result yesterday, exposure on 8/9. Patient is calling to see if she would be a good candidate for the IV immunoglobulin infusion and if we could initiate that process for her. Strict ED precautions given.   Forwarding to PCP  Veronda Prude, RN

## 2020-08-01 NOTE — Telephone Encounter (Signed)
I am not familiar with initiating this on an outpatient basis but I will do some research and call the patient when I find an answer. Thank you.   Lavonda Jumbo, DO 08/01/2020, 2:54 PM PGY-2, [facility]

## 2020-08-15 DIAGNOSIS — Z419 Encounter for procedure for purposes other than remedying health state, unspecified: Secondary | ICD-10-CM | POA: Diagnosis not present

## 2020-08-20 ENCOUNTER — Other Ambulatory Visit: Payer: Self-pay | Admitting: Family Medicine

## 2020-08-21 ENCOUNTER — Other Ambulatory Visit: Payer: Self-pay | Admitting: Family Medicine

## 2020-08-21 MED ORDER — MELOXICAM 7.5 MG PO TABS: 7.5000 mg | ORAL_TABLET | Freq: Every day | ORAL | 0 refills | Status: DC

## 2020-08-22 ENCOUNTER — Ambulatory Visit (INDEPENDENT_AMBULATORY_CARE_PROVIDER_SITE_OTHER): Payer: Medicaid Other | Admitting: Family Medicine

## 2020-08-22 ENCOUNTER — Telehealth: Payer: Self-pay | Admitting: Orthopedic Surgery

## 2020-08-22 ENCOUNTER — Other Ambulatory Visit: Payer: Self-pay | Admitting: *Deleted

## 2020-08-22 ENCOUNTER — Other Ambulatory Visit: Payer: Self-pay

## 2020-08-22 ENCOUNTER — Encounter: Payer: Self-pay | Admitting: Family Medicine

## 2020-08-22 VITALS — BP 140/72 | HR 71 | Ht 64.0 in | Wt 210.0 lb

## 2020-08-22 DIAGNOSIS — R7303 Prediabetes: Secondary | ICD-10-CM

## 2020-08-22 DIAGNOSIS — M19079 Primary osteoarthritis, unspecified ankle and foot: Secondary | ICD-10-CM | POA: Diagnosis not present

## 2020-08-22 DIAGNOSIS — L300 Nummular dermatitis: Secondary | ICD-10-CM

## 2020-08-22 DIAGNOSIS — E785 Hyperlipidemia, unspecified: Secondary | ICD-10-CM

## 2020-08-22 DIAGNOSIS — I10 Essential (primary) hypertension: Secondary | ICD-10-CM

## 2020-08-22 DIAGNOSIS — E1169 Type 2 diabetes mellitus with other specified complication: Secondary | ICD-10-CM | POA: Diagnosis not present

## 2020-08-22 LAB — POCT GLYCOSYLATED HEMOGLOBIN (HGB A1C): HbA1c, POC (prediabetic range): 6.1 % (ref 5.7–6.4)

## 2020-08-22 MED ORDER — TRIAMCINOLONE ACETONIDE 0.5 % EX OINT: 1.0000 "application " | TOPICAL_OINTMENT | Freq: Two times a day (BID) | CUTANEOUS | 0 refills | Status: DC

## 2020-08-22 NOTE — Telephone Encounter (Signed)
Called patient left message to return call per mychart message to schedule an appointment for right foot pain with Dr Carmelia Bake or Denny Peon

## 2020-08-22 NOTE — Progress Notes (Signed)
    SUBJECTIVE:   CHIEF COMPLAINT / HPI:   Sandra Lozano is a 59 yo F who presents for the issue below.   Eczema Rash Flare from 1 week prior with continued pruritis. Has a history of eczema. Rash consistent with her normal flare ups. Has triamcinolone cream at home but is expired.  Medication refill Needs refill on meloxicam. Takes meloxicam for arthritis pain. Mainly having foot pain at this time. Goes to foot doctor next week.   Hypertension: - Medications: Lisinopril-HCTZ 20-12.5mg  - Compliance: Yes - Denies any SOB, CP, vision changes, LE edema, medication SEs, or symptoms of hypotension  Prediabetes  Neuropathy Takes metformin 500mg  daily.   PERTINENT  PMH / PSH: As above.  OBJECTIVE:   BP 140/72   Pulse 71   Ht 5\' 4"  (1.626 m)   Wt 210 lb (95.3 kg)   LMP  (LMP Unknown)   SpO2 97%   BMI 36.05 kg/m   General: Appears well, no acute distress. Age appropriate. Cardiac: RRR, normal heart sounds, no murmurs Respiratory: CTAB, normal effort Skin: Sandpaper like rash on upper right shoulder. No erythema or excoriations. ASSESSMENT/PLAN:   Nummular eczema Acute. Improving but continues to have pruritis.  -Refill triamcinolone ointment -Follow up if fails to improve further  Arthritis of midfoot Review plain films of feet bilateral with degenerative changes. See orthopedic Dr. . -Refill meloxicam was placed prior to this encounter on 08/21/20; patient made aware that medication is at pharmacy  Essential hypertension BP controlled.  -Continue medications -obtain BMET, lipid panel  Pre-diabetes -Obtain repeat A1c, lipid panel -Continue metformin -Encouraged to follow up for full physical   Ophelia Charter, DO [facility]

## 2020-08-22 NOTE — Patient Instructions (Signed)
It was nice meet you.  Today your were seen for foot pain. I would like for your to follow up with the foot doctor on Tuesday. Continue taking your gabapentin as you are.   You blood pressure looked good today. Continue your current medication.  We got lab work today for your cholesterol, blood sugar, and kidney function. If abnormal I will call you. If normal I will communicate via My Chart.   Follow up in about week on 9/17 for a physical or sooner if needed.   Please call the clinic at [phone] if your symptoms worsen or you have any concerns. It was our pleasure to serve you.  Dr. Salvadore Dom

## 2020-08-23 LAB — BASIC METABOLIC PANEL
BUN/Creatinine Ratio: 24 (ref 12–28)
BUN: 16 mg/dL (ref 8–27)
CO2: 27 mmol/L (ref 20–29)
Calcium: 9.8 mg/dL (ref 8.7–10.3)
Chloride: 101 mmol/L (ref 96–106)
Creatinine, Ser: 0.66 mg/dL (ref 0.57–1.00)
GFR calc Af Amer: 111 mL/min/{1.73_m2} (ref >59)
GFR calc non Af Amer: 96 mL/min/{1.73_m2} (ref >59)
Glucose: 84 mg/dL (ref 65–99)
Potassium: 4.5 mmol/L (ref 3.5–5.2)
Sodium: 142 mmol/L (ref 134–144)

## 2020-08-23 LAB — LIPID PANEL
Chol/HDL Ratio: 3.1 ratio (ref 0.0–4.4)
Cholesterol, Total: 170 mg/dL (ref 100–199)
HDL: 54 mg/dL (ref >39)
LDL Chol Calc (NIH): 94 mg/dL (ref 0–99)
Triglycerides: 125 mg/dL (ref 0–149)
VLDL Cholesterol Cal: 22 mg/dL (ref 5–40)

## 2020-08-26 NOTE — Assessment & Plan Note (Signed)
Acute. Improving but continues to have pruritis.  -Refill triamcinolone ointment -Follow up if fails to improve further

## 2020-08-26 NOTE — Assessment & Plan Note (Signed)
BP controlled.  -Continue medications -obtain BMET, lipid panel

## 2020-08-26 NOTE — Assessment & Plan Note (Addendum)
Review plain films of feet bilateral with degenerative changes. See orthopedic Dr. Ophelia Charter. -Refill meloxicam was placed prior to this encounter on 08/21/20; patient made aware that medication is at pharmacy

## 2020-08-26 NOTE — Assessment & Plan Note (Addendum)
-  Obtain repeat A1c, lipid panel -Continue metformin -Encouraged to follow up for full physical

## 2020-08-28 ENCOUNTER — Ambulatory Visit (INDEPENDENT_AMBULATORY_CARE_PROVIDER_SITE_OTHER): Payer: Medicaid Other | Admitting: Physician Assistant

## 2020-08-28 ENCOUNTER — Encounter: Payer: Self-pay | Admitting: Physician Assistant

## 2020-08-28 VITALS — Ht 64.0 in | Wt 210.0 lb

## 2020-08-28 DIAGNOSIS — M7672 Peroneal tendinitis, left leg: Secondary | ICD-10-CM | POA: Diagnosis not present

## 2020-08-28 NOTE — Progress Notes (Signed)
Office Visit Note   Patient: Sandra Lozano           Date of Birth: 13-Oct-1961           MRN: [id number] Visit Date: 08/28/2020              Requested by: Lavonda Jumbo, DO [address] [place],  [place] [address] PCP: Lavonda Jumbo, DO  Chief Complaint  Patient presents with  . Left Foot - Pain      HPI: Is a pleasant 59 year old woman who comes in complaining of left foot lateral foot pain x4 to 6 weeks.  Is very painful to her.  She does have a history of advanced first and second TMT arthritis.  She is also recently doing some exercises that involve some proprioception  Assessment & Plan: Visit Diagnoses: No diagnosis found.  Plan: I will order an MRI of her ankle this can be reviewed.  She also is requesting a viscosupplementation for her right knee which she last had about a year ago we will begin authorization for this.  I have also given her a lateral heel wedge to see if this improves some of her symptoms if she has significant peroneal tendinitis but no tear I think she would be a good candidate for a steroid injection  Follow-Up Instructions: No follow-ups on file.   Ortho Exam  Patient is alert, oriented, no adenopathy, well-dressed, normal affect, normal respiratory effort. Examination of her foot notes minimal soft tissue swelling no cellulitis she is tender over the insertion of the peroneal tendon into the base of the fifth metatarsal.  Previous x-rays do not demonstrate any fracture.  She is nontender over the midfoot.  She also reproduces her tenderness with resisted eversion no sign of infection  Imaging: No results found. No images are attached to the encounter.  Labs: Lab Results  Component Value Date   HGBA1C 6.1 08/22/2020   HGBA1C 6.3 11/01/2019     Lab Results  Component Value Date   ALBUMIN 4.0 05/18/2019    No results found for: MG No results found for: VD25OH  No results found for: PREALBUMIN CBC EXTENDED Latest Ref Rng &  Units 05/21/2019 05/18/2019  WBC 4.0 - 10.5 K/uL 11.4(H) 10.5  RBC 3.87 - 5.11 MIL/uL 3.35(L) 4.78  HGB 12.0 - 15.0 g/dL 1.5(A) 30.9  HCT 36 - 46 % 28.1(L) 41.2  PLT 150 - 400 K/uL 284 356     Body mass index is 36.05 kg/m.  Orders:  No orders of the defined types were placed in this encounter.  No orders of the defined types were placed in this encounter.    Procedures: No procedures performed  Clinical Data: No additional findings.  ROS:  All other systems negative, except as noted in the HPI. Review of Systems  Objective: Vital Signs: Ht 5\' 4"  (1.626 m)   Wt 210 lb (95.3 kg)   LMP  (LMP Unknown)   BMI 36.05 kg/m   Specialty Comments:  No specialty comments available.  PMFS History: Patient Active Problem List   Diagnosis Date Noted  . Skin lesion of scalp 06/04/2020  . Arthritis of midfoot 03/14/2020  . History of lumbar fusion 03/14/2020  . Neuropathy 11/07/2019  . Pre-diabetes 11/07/2019  . Nummular eczema 07/06/2019  . Lumbar stenosis 05/20/2019  . Acute left-sided low back pain without sciatica 07/18/2018  . Osteoarthritis of right hip 06/03/2018  . Chronic right shoulder pain 05/05/2018  . Right hip pain 05/05/2018  .  Rupture of anterior cruciate ligament of right knee 02/15/2018  . Sprain of medial collateral ligament of right knee 02/15/2018  . Essential hypertension 02/15/2018   Past Medical History:  Diagnosis Date  . Arthritis    right knee, lower back  . Dyspnea    very rare -tx with albuterol neb sol if needed  . Hyperlipidemia   . Hypertension   . Pre-diabetes    per patient - does not check blood sugar  . SVD (spontaneous vaginal delivery)    x 5 - only 2 living, 1 stillborn and 1 premature at 7 months demise,1 child deceased  . Wears dentures    upper and lower    No family history on file.  Past Surgical History:  Procedure Laterality Date  . LUMBAR FUSION  05/20/2019   GILL PROCEDURE, LEFT TRANSFORAMINAL LUMBAR INTERBODY  FUSION, PEDICLE INSTRUMENTATION, BILATERAL FUSION (N/A  . TUBAL LIGATION     Social History   Occupational History  . Not on file  Tobacco Use  . Smoking status: Former Smoker    Packs/day: 1.00    Years: 30.00    Pack years: 30.00    Types: Cigarettes    Quit date: 01/2015    Years since quitting: 5.6  . Smokeless tobacco: Never Used  Vaping Use  . Vaping Use: Never used  Substance and Sexual Activity  . Alcohol use: Not Currently  . Drug use: Yes    Types: Marijuana    Comment: last use Tuesday, 05/17/19  . Sexual activity: Not on file

## 2020-08-31 ENCOUNTER — Other Ambulatory Visit: Payer: Self-pay

## 2020-08-31 ENCOUNTER — Telehealth: Payer: Self-pay

## 2020-08-31 ENCOUNTER — Other Ambulatory Visit (HOSPITAL_COMMUNITY)
Admission: RE | Admit: 2020-08-31 | Discharge: 2020-08-31 | Disposition: A | Discharge status: Home / Self Care | Payer: Medicaid Other | Source: Ambulatory Visit | Attending: Family Medicine | Admitting: Family Medicine

## 2020-08-31 ENCOUNTER — Ambulatory Visit (INDEPENDENT_AMBULATORY_CARE_PROVIDER_SITE_OTHER): Payer: Medicaid Other | Admitting: Family Medicine

## 2020-08-31 VITALS — BP 132/60 | HR 64 | Ht 64.0 in | Wt 211.6 lb

## 2020-08-31 DIAGNOSIS — G629 Polyneuropathy, unspecified: Secondary | ICD-10-CM

## 2020-08-31 DIAGNOSIS — Z23 Encounter for immunization: Secondary | ICD-10-CM | POA: Diagnosis not present

## 2020-08-31 DIAGNOSIS — Z01419 Encounter for gynecological examination (general) (routine) without abnormal findings: Secondary | ICD-10-CM | POA: Diagnosis not present

## 2020-08-31 LAB — POCT WET PREP (WET MOUNT)
Clue Cells Wet Prep Whiff POC: POSITIVE
Trichomonas Wet Prep HPF POC: ABSENT

## 2020-08-31 MED ORDER — GABAPENTIN 600 MG PO TABS: 600.0000 mg | ORAL_TABLET | Freq: Two times a day (BID) | ORAL | 2 refills | Status: DC

## 2020-08-31 NOTE — Telephone Encounter (Signed)
-----   Message from Rodena Medin, [place] sent at 08/29/2020 10:24 AM EDT ----- Regarding: gel injection Right knee OA gel injection

## 2020-08-31 NOTE — Progress Notes (Signed)
    SUBJECTIVE:   Chief compliant/HPI: annual examination  Sandra Lozano is a 59 y.o. who presents today for an annual exam.   History tabs reviewed and updated.   Review of systems form reviewed and notable for numbness mostly in the morning in the hands>feet.   OBJECTIVE:   BP 132/60   Pulse 64   Ht 5\' 4"  (1.626 m)   Wt 211 lb 9.6 oz (96 kg)   LMP  (LMP Unknown)   SpO2 99%   BMI 36.32 kg/m   General: Appears well, no acute distress. Age appropriate. Cardiac: RRR, normal heart sounds, no murmurs Respiratory: CTAB, normal effort Abdomen: soft, nontender, nondistended Pelvic exam: normal external genitalia, vulva, vagina, cervix, uterus and adnexa, no palpable internal organs, PAP: Pap smear done today, DNA probe for chlamydia and GC obtained, HPV test, WET MOUNT done - results: clue cells, exam chaperoned by , CMA.  ASSESSMENT/PLAN:   Neuropathy Chronic. Mostly in hands bilateraly and in the morning but also intermittent throughout the day. Normal range of motion and sensation/pulses intact. May have a component of carpal tunnel syndrome.  -Increase gabapentin to 600 mg BID -Hand splints at night -Follow up if symptoms fail to resolve with the above change.   Annual Examination  See AVS for age appropriate recommendations  PHQ score 0, reviewed and discussed.  BP reviewed and at goal.  Asked about intimate partner violence and resources given as appropriate   Considered the following items based upon USPSTF recommendations: Diabetes screening: A1c 6.1 08/22/20  Screening for elevated cholesterol: previously done 08/22/20 HIV testing: ordered Hepatitis C: ordered Hepatitis B: not indicated Syphilis if at high risk: ordered GC/CT patient desired Osteoporosis screening considered based upon risk of fracture from [facility] calculator. Major osteoporotic fracture risk is 2.6%. DEXA not ordered.  Reviewed risk factors for latent tuberculosis and not  indicated Cervical cancer screening: performed today Breast cancer screening: last mammogram 04/17/20 Colorectal cancer screening: discussed, colonoscopy ordered  Vaccinations Tdap, flu.   Follow up in 1 year or sooner if indicated.   06/17/20, DO [facility]

## 2020-08-31 NOTE — Patient Instructions (Addendum)
It was wonderful to see you today.  Please bring ALL of your medications with you to every visit.   Today we talked about:  We talked about numbness in your hands. This may due to your chronic neuropathy or carpal tunnel syndrome. We increased your gabapentin dose to 600 mg BID. Pick up hand splints from your local pharmacy to use at night. If this does not resolve please follow up.   We did your physical exam today. I will call you with any abnormal results. If normal I will communicate via Mychart.   I have referred you to gastroenterology for your colonoscopy. They should call you in 1 week to schedule this. If you do not get a call please [place] know.   Please be sure to schedule follow up at the front  desk before you leave today.   Please call the clinic at [phone] if your symptoms worsen or you have any concerns. It was our pleasure to serve you.  Dr. Salvadore Dom

## 2020-08-31 NOTE — Telephone Encounter (Signed)
Noted  

## 2020-08-31 NOTE — Assessment & Plan Note (Addendum)
Chronic. Mostly in hands bilateraly and in the morning but also intermittent throughout the day. Normal range of motion and sensation/pulses intact. May have a component of carpal tunnel syndrome.  -Increase gabapentin to 600 mg BID -Hand splints at night -Follow up if symptoms fail to resolve with the above change.

## 2020-09-03 LAB — CYTOLOGY - PAP
Adequacy: ABSENT
Comment: NEGATIVE
Diagnosis: NEGATIVE
High risk HPV: NEGATIVE

## 2020-09-03 LAB — HCV INTERPRETATION

## 2020-09-03 LAB — HIV ANTIBODY (ROUTINE TESTING W REFLEX): HIV Screen 4th Generation wRfx: NONREACTIVE

## 2020-09-03 LAB — HCV AB W REFLEX TO QUANT PCR: HCV Ab: <0.1 s/co ratio (ref 0.0–0.9)

## 2020-09-03 LAB — CERVICOVAGINAL ANCILLARY ONLY
Chlamydia: NEGATIVE
Comment: NEGATIVE
Comment: NORMAL
Neisseria Gonorrhea: NEGATIVE

## 2020-09-10 ENCOUNTER — Encounter: Payer: Self-pay | Admitting: Family Medicine

## 2020-09-10 ENCOUNTER — Other Ambulatory Visit: Payer: Self-pay | Admitting: Family Medicine

## 2020-09-13 ENCOUNTER — Telehealth: Payer: Self-pay

## 2020-09-13 NOTE — Telephone Encounter (Signed)
Mailed [employer] application to patient's address listed for Monovisc, right knee. 

## 2020-09-14 DIAGNOSIS — Z419 Encounter for procedure for purposes other than remedying health state, unspecified: Secondary | ICD-10-CM | POA: Diagnosis not present

## 2020-09-20 ENCOUNTER — Telehealth: Payer: Self-pay

## 2020-09-20 NOTE — Telephone Encounter (Signed)
Talked with patient concerning [employer] patient assistance application for gel injection.

## 2020-09-22 ENCOUNTER — Ambulatory Visit
Admission: RE | Admit: 2020-09-22 | Discharge: 2020-09-22 | Disposition: A | Discharge status: Home / Self Care | Payer: Medicaid Other | Source: Ambulatory Visit | Attending: Physician Assistant | Admitting: Physician Assistant

## 2020-09-22 DIAGNOSIS — M7672 Peroneal tendinitis, left leg: Secondary | ICD-10-CM

## 2020-09-22 DIAGNOSIS — M19072 Primary osteoarthritis, left ankle and foot: Secondary | ICD-10-CM | POA: Diagnosis not present

## 2020-09-22 DIAGNOSIS — M7732 Calcaneal spur, left foot: Secondary | ICD-10-CM | POA: Diagnosis not present

## 2020-09-25 ENCOUNTER — Encounter: Payer: Self-pay | Admitting: Family Medicine

## 2020-09-26 ENCOUNTER — Telehealth: Payer: Self-pay

## 2020-09-26 NOTE — Telephone Encounter (Signed)
Faxed completed [employer] application to [phone] for Monovisc, right knee. ?

## 2020-10-01 ENCOUNTER — Encounter: Payer: Self-pay | Admitting: Orthopedic Surgery

## 2020-10-01 ENCOUNTER — Ambulatory Visit (INDEPENDENT_AMBULATORY_CARE_PROVIDER_SITE_OTHER): Payer: Medicaid Other | Admitting: Orthopedic Surgery

## 2020-10-01 VITALS — Ht 64.0 in | Wt 211.0 lb

## 2020-10-01 DIAGNOSIS — M19079 Primary osteoarthritis, unspecified ankle and foot: Secondary | ICD-10-CM

## 2020-10-01 NOTE — Progress Notes (Signed)
Office Visit Note   Patient: Sandra Lozano           Date of Birth: 12/30/1960           MRN: [id number] Visit Date: 10/01/2020              Requested by: Lavonda Jumbo, DO [address] [place],  [place] [address] PCP: Lavonda Jumbo, DO  Chief Complaint  Patient presents with  . Left Ankle - Follow-up    MRI review.       HPI: Patient is a 59 year old woman who complains of pain across the midfoot pain extending along the tendons on the dorsal aspect of her foot as well as pain over the peroneal tendons she is status post an MRI scan.  Patient works as a Research scientist (physical sciences).  Patient currently wears soft slippers or sketcher sneakers.  Assessment & Plan: Visit Diagnoses:  1. Arthritis of midfoot     Plan: Recommended in the meantime to wear a stiff soled hiking shoe to unload the midfoot.  Due to pain with activities of daily living patient states she would like to proceed with surgical intervention will plan for fusion across the base of the first and second metatarsal.  Discussed that she would be out of work for a minimum of 4 weeks.  Risks and benefits of surgery were discussed including delayed union nonunion infection neurovascular injury numbness on the dorsum of her foot need for additional surgery.  Patient states she understands wished to proceed at this time.  Follow-Up Instructions: Return if symptoms worsen or fail to improve.   Ortho Exam  Patient is alert, oriented, no adenopathy, well-dressed, normal affect, normal respiratory effort. Examination patient has a good dorsalis pedis pulse she has palpable bony spurs the base of the first and second metatarsal which are tender to palpation.  Patient's peroneal and posterior tibial tendons are not swollen or tender to palpation.  Patient has active EHL function but this is where she is symptomatic.  Review the radiographs and the MRI scan shows advanced degenerative arthritis of the base of the first  and second metatarsal with intact peroneal and posterior tibial tendons.  Imaging: No results found. No images are attached to the encounter.  Labs: Lab Results  Component Value Date   HGBA1C 6.1 08/22/2020   HGBA1C 6.3 11/01/2019     Lab Results  Component Value Date   ALBUMIN 4.0 05/18/2019    No results found for: MG No results found for: VD25OH  No results found for: PREALBUMIN CBC EXTENDED Latest Ref Rng & Units 05/21/2019 05/18/2019  WBC 4.0 - 10.5 K/uL 11.4(H) 10.5  RBC 3.87 - 5.11 MIL/uL 3.35(L) 4.78  HGB 12.0 - 15.0 g/dL 8.1(E) 56.3  HCT 36 - 46 % 28.1(L) 41.2  PLT 150 - 400 K/uL 284 356     Body mass index is 36.22 kg/m.  Orders:  No orders of the defined types were placed in this encounter.  No orders of the defined types were placed in this encounter.    Procedures: No procedures performed  Clinical Data: No additional findings.  ROS:  All other systems negative, except as noted in the HPI. Review of Systems  Objective: Vital Signs: Ht 5\' 4"  (1.626 m)   Wt 211 lb (95.7 kg)   LMP  (LMP Unknown)   BMI 36.22 kg/m   Specialty Comments:  No specialty comments available.  PMFS History: Patient Active Problem List   Diagnosis Date  Noted  . Skin lesion of scalp 06/04/2020  . Arthritis of midfoot 03/14/2020  . History of lumbar fusion 03/14/2020  . Neuropathy 11/07/2019  . Pre-diabetes 11/07/2019  . Nummular eczema 07/06/2019  . Lumbar stenosis 05/20/2019  . Acute left-sided low back pain without sciatica 07/18/2018  . Osteoarthritis of right hip 06/03/2018  . Chronic right shoulder pain 05/05/2018  . Right hip pain 05/05/2018  . Rupture of anterior cruciate ligament of right knee 02/15/2018  . Sprain of medial collateral ligament of right knee 02/15/2018  . Essential hypertension 02/15/2018   Past Medical History:  Diagnosis Date  . Arthritis    right knee, lower back  . Dyspnea    very rare -tx with albuterol neb sol if needed  .  Hyperlipidemia   . Hypertension   . Pre-diabetes    per patient - does not check blood sugar  . SVD (spontaneous vaginal delivery)    x 5 - only 2 living, 1 stillborn and 1 premature at 7 months demise,1 child deceased  . Wears dentures    upper and lower    No family history on file.  Past Surgical History:  Procedure Laterality Date  . LUMBAR FUSION  05/20/2019   GILL PROCEDURE, LEFT TRANSFORAMINAL LUMBAR INTERBODY FUSION, PEDICLE INSTRUMENTATION, BILATERAL FUSION (N/A  . TUBAL LIGATION     Social History   Occupational History  . Not on file  Tobacco Use  . Smoking status: Former Smoker    Packs/day: 1.00    Years: 30.00    Pack years: 30.00    Types: Cigarettes    Quit date: 01/2015    Years since quitting: 5.7  . Smokeless tobacco: Never Used  Vaping Use  . Vaping Use: Never used  Substance and Sexual Activity  . Alcohol use: Not Currently  . Drug use: Yes    Types: Marijuana    Comment: last use Tuesday, 05/17/19  . Sexual activity: Not on file

## 2020-10-08 ENCOUNTER — Telehealth: Payer: Self-pay

## 2020-10-08 NOTE — Telephone Encounter (Signed)
Please advise on work note.  Patient is still awaiting approval on the gel injection.

## 2020-10-08 NOTE — Telephone Encounter (Signed)
Patient will be scheduled for a midfoot fusion and will need to be out of work for 2 to 3 months after surgery.  Okay to write her a note for this.

## 2020-10-08 NOTE — Telephone Encounter (Signed)
Patient called she is trying to get pre approval for injection. Patient is requesting letter for her job stating she is having surgery and will be out of work for a matter of time  She will come pick the note up. call back:[phone]

## 2020-10-08 NOTE — Telephone Encounter (Signed)
I called patient to advise note is done, however, she doesn't want this note as it does not have actual surgery date on it. She would like to know when she is having surgery so that we can do the note to include that. Can you please advise?

## 2020-10-11 ENCOUNTER — Other Ambulatory Visit: Payer: Self-pay

## 2020-10-11 ENCOUNTER — Telehealth: Payer: Self-pay | Admitting: Gastroenterology

## 2020-10-11 ENCOUNTER — Encounter: Payer: Self-pay | Admitting: Family Medicine

## 2020-10-11 NOTE — Telephone Encounter (Signed)
Hi Dr. Orvan Falconer, we have received a referral from patient's PCP for a repeat colon. Patient had colon in 2015. Records have been received and they will be sent to you for review. Please advise on scheduling. Thank you.

## 2020-10-11 NOTE — Telephone Encounter (Signed)
Awaiting records.  

## 2020-10-11 NOTE — Telephone Encounter (Signed)
I called pt to advise that letter is at the front desk ready for pick up.

## 2020-10-11 NOTE — Telephone Encounter (Signed)
We scheduled surgery for Friday 11/02/20.  See below request about needing an out of work note.  Thank you!

## 2020-10-12 NOTE — Telephone Encounter (Signed)
Reviewed records from [facility].  Colonoscopy performed 10/12/2014 with Dr. 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  Repeat colonoscopy recommended in 10 years.  Next colonoscopy due 2025 or earlier with any new symptoms.  Thank you.

## 2020-10-14 ENCOUNTER — Other Ambulatory Visit: Payer: Self-pay | Admitting: Family Medicine

## 2020-10-15 DIAGNOSIS — Z419 Encounter for procedure for purposes other than remedying health state, unspecified: Secondary | ICD-10-CM | POA: Diagnosis not present

## 2020-10-15 NOTE — Telephone Encounter (Signed)
Please schedule as noted below. Thank you.

## 2020-10-16 ENCOUNTER — Other Ambulatory Visit: Payer: Self-pay

## 2020-10-16 NOTE — Telephone Encounter (Signed)
Left message to call back  

## 2020-10-17 ENCOUNTER — Other Ambulatory Visit: Payer: Self-pay | Admitting: Physician Assistant

## 2020-10-22 ENCOUNTER — Telehealth: Payer: Self-pay | Admitting: Physician Assistant

## 2020-10-22 ENCOUNTER — Encounter: Payer: Self-pay | Admitting: Orthopedic Surgery

## 2020-10-22 ENCOUNTER — Ambulatory Visit (INDEPENDENT_AMBULATORY_CARE_PROVIDER_SITE_OTHER): Payer: Medicaid Other | Admitting: Physician Assistant

## 2020-10-22 VITALS — Ht 64.0 in | Wt 211.0 lb

## 2020-10-22 DIAGNOSIS — M1711 Unilateral primary osteoarthritis, right knee: Secondary | ICD-10-CM

## 2020-10-22 DIAGNOSIS — M1710 Unilateral primary osteoarthritis, unspecified knee: Secondary | ICD-10-CM

## 2020-10-22 MED ORDER — METHYLPREDNISOLONE ACETATE 40 MG/ML IJ SUSP
40.0000 mg | INTRAMUSCULAR | Status: AC | PRN
  Administered 2020-10-22: 40 mg via INTRA_ARTICULAR

## 2020-10-22 NOTE — Progress Notes (Signed)
Office Visit Note   Patient: Sandra Lozano           Date of Birth: 20-Mar-1961           MRN: [id number] Visit Date: 10/22/2020              Requested by: Lavonda Jumbo, DO [address] [place],  [place] [address] PCP: Lavonda Jumbo, DO  Chief Complaint  Patient presents with  . Right Knee - Follow-up    S/p cortisone injection right knee 10/2019      HPI: This is a pleasant woman with a history of right knee arthritis. She had an injection last year that worked quite well. She is now anticipating left foot fusion surgery. She is wondering if she can have an injection of cortisone into her right knee  Assessment & Plan: Visit Diagnoses: No diagnosis found.  Plan: Patient will follow-up for her left foot surgery. We talked about her getting a knee scooter I think this would be a great option for her  Follow-Up Instructions: No follow-ups on file.   Ortho Exam  Patient is alert, oriented, no adenopathy, well-dressed, normal affect, normal respiratory effort. Left knee no effusion no cellulitis tenderness over the medial lateral joint line. Compartments are soft and nontender  Imaging: No results found. No images are attached to the encounter.  Labs: Lab Results  Component Value Date   HGBA1C 6.1 08/22/2020   HGBA1C 6.3 11/01/2019     Lab Results  Component Value Date   ALBUMIN 4.0 05/18/2019    No results found for: MG No results found for: VD25OH  No results found for: PREALBUMIN CBC EXTENDED Latest Ref Rng & Units 05/21/2019 05/18/2019  WBC 4.0 - 10.5 K/uL 11.4(H) 10.5  RBC 3.87 - 5.11 MIL/uL 3.35(L) 4.78  HGB 12.0 - 15.0 g/dL 6.2(I) 29.7  HCT 36 - 46 % 28.1(L) 41.2  PLT 150 - 400 K/uL 284 356     Body mass index is 36.22 kg/m.  Orders:  No orders of the defined types were placed in this encounter.  No orders of the defined types were placed in this encounter.    Procedures: Large Joint Inj: R knee on 10/22/2020 8:43 AM Indications: pain  and diagnostic evaluation Details: 22 G 1.5 in needle, anteromedial approach  Arthrogram: No  Medications: 40 mg methylPREDNISolone acetate 40 MG/ML Outcome: tolerated well, no immediate complications Procedure, treatment alternatives, risks and benefits explained, specific risks discussed. Consent was given by the patient.      Clinical Data: No additional findings.  ROS:  All other systems negative, except as noted in the HPI. Review of Systems  Objective: Vital Signs: Ht 5\' 4"  (1.626 m)   Wt 211 lb (95.7 kg)   LMP  (LMP Unknown)   BMI 36.22 kg/m   Specialty Comments:  No specialty comments available.  PMFS History: Patient Active Problem List   Diagnosis Date Noted  . Skin lesion of scalp 06/04/2020  . Arthritis of midfoot 03/14/2020  . History of lumbar fusion 03/14/2020  . Neuropathy 11/07/2019  . Pre-diabetes 11/07/2019  . Nummular eczema 07/06/2019  . Lumbar stenosis 05/20/2019  . Acute left-sided low back pain without sciatica 07/18/2018  . Osteoarthritis of right hip 06/03/2018  . Chronic right shoulder pain 05/05/2018  . Right hip pain 05/05/2018  . Rupture of anterior cruciate ligament of right knee 02/15/2018  . Sprain of medial collateral ligament of right knee 02/15/2018  . Essential hypertension 02/15/2018  Past Medical History:  Diagnosis Date  . Arthritis    right knee, lower back  . Dyspnea    very rare -tx with albuterol neb sol if needed  . Hyperlipidemia   . Hypertension   . Pre-diabetes    per patient - does not check blood sugar  . SVD (spontaneous vaginal delivery)    x 5 - only 2 living, 1 stillborn and 1 premature at 7 months demise,1 child deceased  . Wears dentures    upper and lower    No family history on file.  Past Surgical History:  Procedure Laterality Date  . LUMBAR FUSION  05/20/2019   GILL PROCEDURE, LEFT TRANSFORAMINAL LUMBAR INTERBODY FUSION, PEDICLE INSTRUMENTATION, BILATERAL FUSION (N/A  . TUBAL LIGATION       Social History   Occupational History  . Not on file  Tobacco Use  . Smoking status: Former Smoker    Packs/day: 1.00    Years: 30.00    Pack years: 30.00    Types: Cigarettes    Quit date: 01/2015    Years since quitting: 5.7  . Smokeless tobacco: Never Used  Vaping Use  . Vaping Use: Never used  Substance and Sexual Activity  . Alcohol use: Not Currently  . Drug use: Yes    Types: Marijuana    Comment: last use Tuesday, 05/17/19  . Sexual activity: Not on file

## 2020-10-22 NOTE — Telephone Encounter (Signed)
Sent pt message in my chart.

## 2020-10-22 NOTE — Telephone Encounter (Signed)
Pt called saying she forgot to get the inserts for her shoes..  If they could be left at the front desk and she come pick them up tomorrow.   Pt said you can send a message in mychart to let her know that they are there.

## 2020-10-22 NOTE — Telephone Encounter (Signed)
Left lateral heel wedges. Left them on your desk

## 2020-10-22 NOTE — Telephone Encounter (Signed)
Pt was in the office today. Did you discuss any time for shoe insert, orthotic with the pt during the visit? She siad that she forgot to get them while in the office today. I advised if it was an insert or orthotic that we did not have them here and where she could get them.

## 2020-10-22 NOTE — Telephone Encounter (Signed)
Patient called. She would like [place] to call her about she lifts that she talked about today. Her call back number is [phone]

## 2020-10-23 NOTE — Telephone Encounter (Signed)
Message in [place] to advise.

## 2020-10-28 ENCOUNTER — Other Ambulatory Visit: Payer: Self-pay | Admitting: Family Medicine

## 2020-10-31 ENCOUNTER — Encounter (HOSPITAL_COMMUNITY): Payer: Self-pay | Admitting: Orthopedic Surgery

## 2020-10-31 ENCOUNTER — Other Ambulatory Visit (HOSPITAL_COMMUNITY)
Admission: RE | Admit: 2020-10-31 | Discharge: 2020-10-31 | Disposition: A | Discharge status: Home / Self Care | Payer: Medicaid Other | Source: Ambulatory Visit | Attending: Orthopedic Surgery | Admitting: Orthopedic Surgery

## 2020-10-31 DIAGNOSIS — Z20822 Contact with and (suspected) exposure to covid-19: Secondary | ICD-10-CM | POA: Diagnosis not present

## 2020-10-31 DIAGNOSIS — Z01812 Encounter for preprocedural laboratory examination: Secondary | ICD-10-CM | POA: Diagnosis not present

## 2020-10-31 LAB — SARS CORONAVIRUS 2 (TAT 6-24 HRS): SARS Coronavirus 2: NEGATIVE

## 2020-10-31 NOTE — Progress Notes (Addendum)
PCP:  Orpah Cobb, DO Cardiologist:  Denies  EKG:  DOS CXR:  N/A ECHO:  Denies Stress Test:  Denies Cardiac Cath:  Denies   Fasting Blood Sugar-  Does not check BG Checks Blood Sugar_0__ times a day  OSA/CPAP:  No ASA/Blood Thinner:  No  Covid test 11/17 negative  Anesthesia Review:  No  Patient denies shortness of breath, fever, cough, and chest pain at PAT appointment.  Patient verbalized understanding of instructions provided today at the PAT appointment.  Patient asked to review instructions at home and day of surgery.

## 2020-11-02 ENCOUNTER — Ambulatory Visit (HOSPITAL_COMMUNITY): Payer: Medicaid Other | Admitting: Certified Registered"

## 2020-11-02 ENCOUNTER — Ambulatory Visit (HOSPITAL_COMMUNITY)
Admission: RE | Admit: 2020-11-02 | Discharge: 2020-11-02 | Disposition: A | Discharge status: Home / Self Care | Payer: Medicaid Other | Attending: Orthopedic Surgery | Admitting: Orthopedic Surgery

## 2020-11-02 ENCOUNTER — Encounter (HOSPITAL_COMMUNITY)
Admission: RE | Disposition: A | Discharge status: Home / Self Care | Payer: Self-pay | Source: Home / Self Care | Attending: Orthopedic Surgery

## 2020-11-02 ENCOUNTER — Other Ambulatory Visit: Payer: Self-pay

## 2020-11-02 ENCOUNTER — Ambulatory Visit (HOSPITAL_COMMUNITY): Payer: Medicaid Other

## 2020-11-02 ENCOUNTER — Encounter (HOSPITAL_COMMUNITY): Payer: Self-pay | Admitting: Orthopedic Surgery

## 2020-11-02 DIAGNOSIS — Z7984 Long term (current) use of oral hypoglycemic drugs: Secondary | ICD-10-CM | POA: Insufficient documentation

## 2020-11-02 DIAGNOSIS — M19072 Primary osteoarthritis, left ankle and foot: Secondary | ICD-10-CM | POA: Diagnosis not present

## 2020-11-02 DIAGNOSIS — Z79899 Other long term (current) drug therapy: Secondary | ICD-10-CM | POA: Diagnosis not present

## 2020-11-02 DIAGNOSIS — Z87891 Personal history of nicotine dependence: Secondary | ICD-10-CM | POA: Diagnosis not present

## 2020-11-02 DIAGNOSIS — M12572 Traumatic arthropathy, left ankle and foot: Secondary | ICD-10-CM

## 2020-11-02 DIAGNOSIS — Z791 Long term (current) use of non-steroidal anti-inflammatories (NSAID): Secondary | ICD-10-CM | POA: Insufficient documentation

## 2020-11-02 DIAGNOSIS — E119 Type 2 diabetes mellitus without complications: Secondary | ICD-10-CM | POA: Diagnosis not present

## 2020-11-02 DIAGNOSIS — I10 Essential (primary) hypertension: Secondary | ICD-10-CM | POA: Diagnosis not present

## 2020-11-02 HISTORY — PX: FOOT ARTHRODESIS: SHX1655

## 2020-11-02 LAB — POCT I-STAT, CHEM 8
BUN: 10 mg/dL (ref 6–20)
Calcium, Ion: 1.25 mmol/L (ref 1.15–1.40)
Chloride: 102 mmol/L (ref 98–111)
Creatinine, Ser: 0.6 mg/dL (ref 0.44–1.00)
Glucose, Bld: 106 mg/dL — ABNORMAL HIGH (ref 70–99)
HCT: 43 % (ref 36.0–46.0)
Hemoglobin: 14.6 g/dL (ref 12.0–15.0)
Potassium: 3.9 mmol/L (ref 3.5–5.1)
Sodium: 140 mmol/L (ref 135–145)
TCO2: 27 mmol/L (ref 22–32)

## 2020-11-02 LAB — GLUCOSE, CAPILLARY
Glucose-Capillary: 115 mg/dL — ABNORMAL HIGH (ref 70–99)
Glucose-Capillary: 105 mg/dL — ABNORMAL HIGH (ref 70–99)

## 2020-11-02 SURGERY — FUSION, JOINT, FOOT
Anesthesia: Regional | Site: Foot | Laterality: Left

## 2020-11-02 MED ORDER — FENTANYL CITRATE (PF) 100 MCG/2ML IJ SOLN: 100.0000 ug | Freq: Once | INTRAMUSCULAR | Status: AC

## 2020-11-02 MED ORDER — MIDAZOLAM HCL 2 MG/2ML IJ SOLN
INTRAMUSCULAR | Status: AC
  Administered 2020-11-02: 2 mg via INTRAVENOUS
  Filled 2020-11-02: qty 2

## 2020-11-02 MED ORDER — LACTATED RINGERS IV SOLN: INTRAVENOUS | Status: DC

## 2020-11-02 MED ORDER — FENTANYL CITRATE (PF) 100 MCG/2ML IJ SOLN
INTRAMUSCULAR | Status: DC | PRN
  Administered 2020-11-02: 50 ug via INTRAVENOUS

## 2020-11-02 MED ORDER — AMISULPRIDE (ANTIEMETIC) 5 MG/2ML IV SOLN: 10.0000 mg | Freq: Once | INTRAVENOUS | Status: DC | PRN

## 2020-11-02 MED ORDER — FENTANYL CITRATE (PF) 100 MCG/2ML IJ SOLN
INTRAMUSCULAR | Status: AC
  Administered 2020-11-02: 100 ug via INTRAVENOUS
  Filled 2020-11-02: qty 2

## 2020-11-02 MED ORDER — MEPERIDINE HCL 25 MG/ML IJ SOLN: 6.2500 mg | INTRAMUSCULAR | Status: DC | PRN

## 2020-11-02 MED ORDER — HYDROMORPHONE HCL 1 MG/ML IJ SOLN: 0.2500 mg | INTRAMUSCULAR | Status: DC | PRN

## 2020-11-02 MED ORDER — CHLORHEXIDINE GLUCONATE 0.12 % MT SOLN
OROMUCOSAL | Status: AC
  Administered 2020-11-02: 15 mL via OROMUCOSAL
  Filled 2020-11-02: qty 15

## 2020-11-02 MED ORDER — OXYCODONE HCL 5 MG/5ML PO SOLN: 5.0000 mg | Freq: Once | ORAL | Status: DC | PRN

## 2020-11-02 MED ORDER — PROPOFOL 500 MG/50ML IV EMUL
INTRAVENOUS | Status: DC | PRN
  Administered 2020-11-02: 50 ug/kg/min via INTRAVENOUS
  Administered 2020-11-02 (×2): 75 ug/kg/min via INTRAVENOUS

## 2020-11-02 MED ORDER — ORAL CARE MOUTH RINSE: 15.0000 mL | Freq: Once | OROMUCOSAL | Status: AC

## 2020-11-02 MED ORDER — LIDOCAINE HCL (PF) 2 % IJ SOLN
INTRAMUSCULAR | Status: AC
  Filled 2020-11-02: qty 10

## 2020-11-02 MED ORDER — ACETAMINOPHEN 160 MG/5ML PO SOLN: 325.0000 mg | Freq: Once | ORAL | Status: DC | PRN

## 2020-11-02 MED ORDER — MIDAZOLAM HCL 2 MG/2ML IJ SOLN: 2.0000 mg | Freq: Once | INTRAMUSCULAR | Status: AC

## 2020-11-02 MED ORDER — ACETAMINOPHEN 10 MG/ML IV SOLN: 1000.0000 mg | Freq: Once | INTRAVENOUS | Status: DC | PRN

## 2020-11-02 MED ORDER — OXYCODONE-ACETAMINOPHEN 5-325 MG PO TABS: 1.0000 | ORAL_TABLET | ORAL | 0 refills | Status: DC | PRN

## 2020-11-02 MED ORDER — 0.9 % SODIUM CHLORIDE (POUR BTL) OPTIME
TOPICAL | Status: DC | PRN
  Administered 2020-11-02: 1000 mL

## 2020-11-02 MED ORDER — PROPOFOL 10 MG/ML IV BOLUS
INTRAVENOUS | Status: AC
  Filled 2020-11-02: qty 40

## 2020-11-02 MED ORDER — CEFAZOLIN SODIUM-DEXTROSE 2-4 GM/100ML-% IV SOLN
2.0000 g | INTRAVENOUS | Status: AC
  Administered 2020-11-02: 2 g via INTRAVENOUS
  Filled 2020-11-02: qty 100

## 2020-11-02 MED ORDER — ACETAMINOPHEN 325 MG PO TABS: 325.0000 mg | ORAL_TABLET | Freq: Once | ORAL | Status: DC | PRN

## 2020-11-02 MED ORDER — LIDOCAINE HCL (CARDIAC) PF 100 MG/5ML IV SOSY
PREFILLED_SYRINGE | INTRAVENOUS | Status: DC | PRN
  Administered 2020-11-02: 40 mg via INTRAVENOUS

## 2020-11-02 MED ORDER — FENTANYL CITRATE (PF) 250 MCG/5ML IJ SOLN
INTRAMUSCULAR | Status: AC
  Filled 2020-11-02: qty 5

## 2020-11-02 MED ORDER — BUPIVACAINE-EPINEPHRINE (PF) 0.5% -1:200000 IJ SOLN
INTRAMUSCULAR | Status: DC | PRN
  Administered 2020-11-02: 30 mL

## 2020-11-02 MED ORDER — OXYCODONE HCL 5 MG PO TABS: 5.0000 mg | ORAL_TABLET | Freq: Once | ORAL | Status: DC | PRN

## 2020-11-02 MED ORDER — CHLORHEXIDINE GLUCONATE 0.12 % MT SOLN: 15.0000 mL | Freq: Once | OROMUCOSAL | Status: AC

## 2020-11-02 SURGICAL SUPPLY — 46 items
BIT DRILL CANNULTD 2.6 X 130MM (DRILL) ×1 IMPLANT
BLADE LONG MED 31X9 (MISCELLANEOUS) ×2 IMPLANT
BLADE SAW SGTL HD 18.5X60.5X1. (BLADE) ×2 IMPLANT
BLADE SURG 10 STRL SS (BLADE) ×2 IMPLANT
BNDG COHESIVE 4X5 TAN STRL (GAUZE/BANDAGES/DRESSINGS) ×2 IMPLANT
BNDG COHESIVE 6X5 TAN STRL LF (GAUZE/BANDAGES/DRESSINGS) ×2 IMPLANT
BNDG GAUZE ELAST 4 BULKY (GAUZE/BANDAGES/DRESSINGS) ×4 IMPLANT
COTTON STERILE ROLL (GAUZE/BANDAGES/DRESSINGS) ×2 IMPLANT
COVER SURGICAL LIGHT HANDLE (MISCELLANEOUS) ×4 IMPLANT
COVER WAND RF STERILE (DRAPES) ×2 IMPLANT
DRAPE INCISE IOBAN 66X45 STRL (DRAPES) ×2 IMPLANT
DRAPE OEC MINIVIEW 54X84 (DRAPES) ×2 IMPLANT
DRAPE U-SHAPE 47X51 STRL (DRAPES) ×2 IMPLANT
DRILL CANNULATED 2.6 X 130MM (DRILL) ×2
DRSG ADAPTIC 3X8 NADH LF (GAUZE/BANDAGES/DRESSINGS) ×2 IMPLANT
DRSG TUBE GAUZE 1X5YD SZ2 (GAUZE/BANDAGES/DRESSINGS) ×2 IMPLANT
DURAPREP 26ML APPLICATOR (WOUND CARE) ×2 IMPLANT
ELECT REM PT RETURN 9FT ADLT (ELECTROSURGICAL) ×2
ELECTRODE REM PT RTRN 9FT ADLT (ELECTROSURGICAL) ×1 IMPLANT
GAUZE SPONGE 4X4 12PLY STRL (GAUZE/BANDAGES/DRESSINGS) ×2 IMPLANT
GLOVE BIOGEL PI IND STRL 9 (GLOVE) ×1 IMPLANT
GLOVE BIOGEL PI INDICATOR 9 (GLOVE) ×1
GLOVE SURG ORTHO 9.0 STRL STRW (GLOVE) ×2 IMPLANT
GOWN STRL REUS W/ TWL XL LVL3 (GOWN DISPOSABLE) ×3 IMPLANT
GOWN STRL REUS W/TWL XL LVL3 (GOWN DISPOSABLE) ×3
K-WIRE SNGL END 1.2X150 (MISCELLANEOUS) ×6
KIT BASIN OR (CUSTOM PROCEDURE TRAY) ×2 IMPLANT
KIT TURNOVER KIT B (KITS) ×2 IMPLANT
KWIRE SNGL END 1.2X150 (MISCELLANEOUS) ×3 IMPLANT
MANIFOLD NEPTUNE II (INSTRUMENTS) ×2 IMPLANT
NS IRRIG 1000ML POUR BTL (IV SOLUTION) ×2 IMPLANT
PACK ORTHO EXTREMITY (CUSTOM PROCEDURE TRAY) ×2 IMPLANT
PAD ABD 8X10 STRL (GAUZE/BANDAGES/DRESSINGS) ×2 IMPLANT
PAD ARMBOARD 7.5X6 YLW CONV (MISCELLANEOUS) ×4 IMPLANT
PAD CAST 4YDX4 CTTN HI CHSV (CAST SUPPLIES) ×1 IMPLANT
PADDING CAST COTTON 4X4 STRL (CAST SUPPLIES) ×1
SCREW CANN HDLS ST 4.0X36 (Screw) ×2 IMPLANT
SCREW CANN HDLS ST 4.0X40 (Screw) ×2 IMPLANT
SPONGE LAP 18X18 RF (DISPOSABLE) ×2 IMPLANT
SUCTION FRAZIER HANDLE 10FR (MISCELLANEOUS) ×1
SUCTION TUBE FRAZIER 10FR DISP (MISCELLANEOUS) ×1 IMPLANT
SUT ETHILON 2 0 PSLX (SUTURE) ×6 IMPLANT
TOWEL GREEN STERILE (TOWEL DISPOSABLE) ×2 IMPLANT
TOWEL GREEN STERILE FF (TOWEL DISPOSABLE) ×2 IMPLANT
TUBE CONNECTING 12X1/4 (SUCTIONS) ×2 IMPLANT
WATER STERILE IRR 1000ML POUR (IV SOLUTION) ×2 IMPLANT

## 2020-11-02 NOTE — Transfer of Care (Signed)
Immediate Anesthesia Transfer of Care Note  Patient: Sandra Lozano  Procedure(s) Performed: LEFT MIDFOOT FUSION BASE 1ST AND 2ND METATARSAL (Left Foot)  Patient Location: PACU  Anesthesia Type:MAC and Regional  Level of Consciousness: awake, alert  and oriented  Airway & Oxygen Therapy: Patient Spontanous Breathing and Patient connected to face mask oxygen  Post-op Assessment: Report given to RN and Post -op Vital signs reviewed and stable  Post vital signs: Reviewed and stable  Last Vitals:  Vitals Value Taken Time  BP    Temp    Pulse    Resp    SpO2      Last Pain:  Vitals:   11/02/20 0756  TempSrc:   PainSc: 6       Patients Stated Pain Goal: 3 (37/90/24 0973)  Complications: No complications documented.

## 2020-11-02 NOTE — Interval H&P Note (Signed)
History and Physical Interval Note:  11/02/2020 6:52 AM  Sandra Lozano  has presented today for surgery, with the diagnosis of Osteoarthritis Left Midfoot.  The various methods of treatment have been discussed with the patient and family. After consideration of risks, benefits and other options for treatment, the patient has consented to  Procedure(s): LEFT MIDFOOT FUSION BASE 1ST AND 2ND METATARSAL (Left) as a surgical intervention.  The patient's history has been reviewed, patient examined, no change in status, stable for surgery.  I have reviewed the patient's chart and labs.  Questions were answered to the patient's satisfaction.     Nadara Mustard

## 2020-11-02 NOTE — Anesthesia Postprocedure Evaluation (Signed)
Anesthesia Post Note  Patient: Sandra Lozano  Procedure(s) Performed: LEFT MIDFOOT FUSION BASE 1ST AND 2ND METATARSAL (Left Foot)     Patient location during evaluation: PACU Anesthesia Type: Regional Level of consciousness: awake and alert Pain management: pain level controlled Vital Signs Assessment: post-procedure vital signs reviewed and stable Respiratory status: spontaneous breathing, nonlabored ventilation, respiratory function stable and patient connected to nasal cannula oxygen Cardiovascular status: stable and blood pressure returned to baseline Postop Assessment: no apparent nausea or vomiting Anesthetic complications: no   No complications documented.  Last Vitals:  Vitals:   11/02/20 1025 11/02/20 1038  BP: (!) 172/78 (!) 169/59  Pulse: 64 (!) 57  Resp: 18 12  Temp:    SpO2: 100% 100%    Last Pain:  Vitals:   11/02/20 1025  TempSrc:   PainSc: 0-No pain                 [name]

## 2020-11-02 NOTE — Anesthesia Preprocedure Evaluation (Addendum)
Anesthesia Evaluation  Patient identified by MRN, date of birth, ID band Patient awake    Reviewed: Allergy & Precautions, NPO status , Patient's Chart, lab work & pertinent test results  Airway Mallampati: I  TM Distance: >3 FB Neck ROM: Full    Dental  (+) Poor Dentition, Missing, Dental Advisory Given   Pulmonary former smoker,    breath sounds clear to auscultation       Cardiovascular hypertension, Pt. on medications  Rhythm:Regular Rate:Normal     Neuro/Psych negative neurological ROS  negative psych ROS   GI/Hepatic negative GI ROS, Neg liver ROS,   Endo/Other  diabetes, Type 2, Oral Hypoglycemic Agents  Renal/GU negative Renal ROS     Musculoskeletal  (+) Arthritis ,   Abdominal Normal abdominal exam  (+)   Peds  Hematology negative hematology ROS (+)   Anesthesia Other Findings - HLD  Reproductive/Obstetrics                            Anesthesia Physical Anesthesia Plan  ASA: II  Anesthesia Plan: Regional   Post-op Pain Management:    Induction: Intravenous  PONV Risk Score and Plan: 3 and Ondansetron, Propofol infusion and Midazolam  Airway Management Planned: Natural Airway and Simple Face Mask  Additional Equipment: None  Intra-op Plan:   Post-operative Plan:   Informed Consent: I have reviewed the patients History and Physical, chart, labs and discussed the procedure including the risks, benefits and alternatives for the proposed anesthesia with the patient or authorized representative who has indicated his/her understanding and acceptance.       Plan Discussed with: CRNA  Anesthesia Plan Comments:         Anesthesia Quick Evaluation

## 2020-11-02 NOTE — Anesthesia Procedure Notes (Signed)
Anesthesia Regional Block: Popliteal block   Pre-Anesthetic Checklist: ,, timeout performed, Correct Patient, Correct Site, Correct Laterality, Correct Procedure, Correct Position, site marked, Risks and benefits discussed,  Surgical consent,  Pre-op evaluation,  At surgeon's request and post-op pain management  Laterality: Left  Prep: chloraprep       Needles:  Injection technique: Single-shot  Needle Type: Echogenic Stimulator Needle     Needle Length: 9cm  Needle Gauge: 21     Additional Needles:   Procedures:,,,, ultrasound used (permanent image in chart),,,,  Narrative:  Start time: 11/02/2020 8:20 AM End time: 11/02/2020 8:25 AM Injection made incrementally with aspirations every 5 mL.  Performed by: Personally  Anesthesiologist: [name], MD  Additional Notes: Patient tolerated the procedure well. Local anesthetic introduced in an incremental fashion under minimal resistance after negative aspirations. No paresthesias were elicited. After completion of the procedure, no acute issues were identified and patient continued to be monitored by RN.

## 2020-11-02 NOTE — H&P (Signed)
Sandra Lozano is an 59 y.o. female.   Chief Complaint: left foot pain HPI: Patient is a 59 year old woman who complains of pain across the midfoot pain extending along the tendons on the dorsal aspect of her foot as well as pain over the peroneal tendons she is status post an MRI scan.  Past Medical History:  Diagnosis Date  . Arthritis    right knee, lower back  . Dyspnea    very rare -tx with albuterol neb sol if needed  . Hyperlipidemia   . Hypertension   . Pre-diabetes    per patient - does not check blood sugar  . SVD (spontaneous vaginal delivery)    x 5 - only 2 living, 1 stillborn and 1 premature at 7 months demise,1 child deceased  . Wears dentures    upper and lower    Past Surgical History:  Procedure Laterality Date  . LUMBAR FUSION  05/20/2019   GILL PROCEDURE, LEFT TRANSFORAMINAL LUMBAR INTERBODY FUSION, PEDICLE INSTRUMENTATION, BILATERAL FUSION (N/A  . TUBAL LIGATION      History reviewed. No pertinent family history. Social History:  reports that she quit smoking about 5 years ago. Her smoking use included cigarettes. She has a 30.00 pack-year smoking history. She has never used smokeless tobacco. She reports previous alcohol use. She reports current drug use. Drug: Marijuana.  Allergies: No Known Allergies  Medications Prior to Admission  Medication Sig Dispense Refill  . diclofenac Sodium (VOLTAREN) 1 % GEL Apply 4 g topically 3 (three) times daily as needed. (Patient taking differently: Apply 4 g topically 3 (three) times daily as needed (pain.). ) 150 g 2  . gabapentin (NEURONTIN) 600 MG tablet Take 1 tablet (600 mg total) by mouth 2 (two) times daily. 60 tablet 2  . lisinopril-hydrochlorothiazide (ZESTORETIC) 20-12.5 MG tablet Take 1 tablet by mouth daily. (Patient taking differently: Take 1 tablet by mouth every evening. ) 90 tablet 3  . meloxicam (MOBIC) 7.5 MG tablet Take 1 tablet by mouth once daily (Patient taking differently: Take 7.5 mg by mouth every  evening. ) 30 tablet 0  . metFORMIN (GLUCOPHAGE) 500 MG tablet Take 1 tablet by mouth once daily with breakfast (Patient taking differently: Take 500 mg by mouth every evening. ) 90 tablet 0  . Omega-3 Fatty Acids (FISH OIL) 1000 MG CAPS Take 1,000 mg by mouth at bedtime.    . triamcinolone ointment (KENALOG) 0.5 % Apply 1 application topically 2 (two) times daily. (Patient taking differently: Apply 1 application topically 2 (two) times daily as needed (rash/skin irritation.). ) 30 g 0  . atorvastatin (LIPITOR) 20 MG tablet Take 1 tablet (20 mg total) by mouth every evening. 90 tablet 0    Results for orders placed or performed during the hospital encounter of 10/31/20 (from the past 48 hour(s))  SARS CORONAVIRUS 2 (TAT 6-24 HRS) Nasopharyngeal Nasopharyngeal Swab     Status: None   Collection Time: 10/31/20  8:39 AM   Specimen: Nasopharyngeal Swab  Result Value Ref Range   SARS Coronavirus 2 NEGATIVE NEGATIVE    Comment: (NOTE) SARS-CoV-2 target nucleic acids are NOT DETECTED.  The SARS-CoV-2 RNA is generally detectable in upper and lower respiratory specimens during the acute phase of infection. Negative results do not preclude SARS-CoV-2 infection, do not rule out co-infections with other pathogens, and should not be used as the sole basis for treatment or other patient management decisions. Negative results must be combined with clinical observations, patient history, and epidemiological information. The  expected result is Negative.  Fact Sheet for Patients: HairSlick.no  Fact Sheet for Healthcare Providers: quierodirigir.com  This test is not yet approved or cleared by the [place] FDA and  has been authorized for detection and/or diagnosis of SARS-CoV-2 by FDA under an Emergency Use Authorization (EUA). This EUA will remain  in effect (meaning this test can be used) for the duration of the COVID-19 declaration under Se  ction 564(b)(1) of the Act, 21 U.S.C. section 360bbb-3(b)(1), unless the authorization is terminated or revoked sooner.  Performed at [facility], [address] [place], [place] [address]    No results found.  Review of Systems  All other systems reviewed and are negative.   There were no vitals taken for this visit. Physical Exam  Patient is alert, oriented, no adenopathy, well-dressed, normal affect, normal respiratory effort. Examination patient has a good dorsalis pedis pulse she has palpable bony spurs the base of the first and second metatarsal which are tender to palpation.  Patient's peroneal and posterior tibial tendons are not swollen or tender to palpation.  Patient has active EHL function but this is where she is symptomatic.  Review the radiographs and the MRI scan shows advanced degenerative arthritis of the base of the first and second metatarsal with intact peroneal and posterior tibial tendons.Heart RRR Lungs Clear Assessment/Plan 1. Arthritis of midfoot     Plan: Recommended in the meantime to wear a stiff soled hiking shoe to unload the midfoot.  Due to pain with activities of daily living patient states she would like to proceed with surgical intervention will plan for fusion across the base of the first and second metatarsal.  Discussed that she would be out of work for a minimum of 4 weeks.  Risks and benefits of surgery were discussed including delayed union nonunion infection neurovascular injury numbness on the dorsum of her foot need for additional surgery.  Patient states she understands wished to proceed at this time.   [place] Persons, PA 11/02/2020, 6:37 AM

## 2020-11-02 NOTE — Op Note (Signed)
11/02/2020  9:53 AM  PATIENT:  Sandra Lozano    PRE-OPERATIVE DIAGNOSIS:  Osteoarthritis Left Midfoot  POST-OPERATIVE DIAGNOSIS:  Same  PROCEDURE:  LEFT MIDFOOT FUSION BASE 1ST AND 2ND METATARSAL C-arm fluoroscopy to verify 1  SURGEON:  Nadara Mustard, MD  PHYSICIAN ASSISTANT:None ANESTHESIA:   General  PREOPERATIVE INDICATIONS:  Sandra Lozano is a  59 y.o. female with a diagnosis of Osteoarthritis Left Midfoot who failed conservative measures and elected for surgical management.    The risks benefits and alternatives were discussed with the patient preoperatively including but not limited to the risks of infection, bleeding, nerve injury, cardiopulmonary complications, the need for revision surgery, among others, and the patient was willing to proceed.  OPERATIVE IMPLANTS: 2 headless cannulated screws [employer]  @ENCIMAGES @  OPERATIVE FINDINGS: Traumatic arthritis base of the first and second metatarsal.  C-arm fluoroscopy verified reduction AP and lateral planes  OPERATIVE PROCEDURE: Patient brought the operating room and underwent general anesthetic.  After adequate levels anesthesia were obtained patient's left lower extremity was prepped using DuraPrep draped into a sterile field a timeout was called.  A dorsal incision was made in the first webspace this was carried down through the interval of the anterior tibial tendon and the EHL.  The tendons were retracted the base of the first metatarsal medial cuneiform joint was protected with Hohmann retractors and an oscillating saw was used to debride the articular cartilage from the joint overhanging bone spurs were also removed with the saw and rondure.  The joint was reduced a K wire was inserted across the joint so fluoroscopy verified alignment and a 36 mm compression screw was placed.  Further debridement was performed base of the second metatarsal bony spurs were removed with a saw and the rondure.  A guidewire was inserted from the  medial cuneiform through the base of the second metatarsal.  See arthroscopy verified alignment and a 40 mm compression screw was placed this had a good compression fit the wounds irrigated normal saline incision was closed using 2-0 nylon a sterile dressing was applied patient was taken the PACU in stable condition   DISCHARGE PLANNING:  Antibiotic duration: Preoperative antibiotics  Weightbearing: Nonweightbearing on the left  Pain medication: Prescription for Percocet  Dressing care/ Wound VAC: Follow-up in the office in 1 week to change the dressing  Ambulatory devices: Walker or crutches or kneeling scooter  Discharge to: Home.  Follow-up: In the office 1 week post operative.

## 2020-11-05 ENCOUNTER — Encounter (HOSPITAL_COMMUNITY): Payer: Self-pay | Admitting: Orthopedic Surgery

## 2020-11-07 ENCOUNTER — Encounter: Payer: Self-pay | Admitting: Family

## 2020-11-07 ENCOUNTER — Ambulatory Visit (INDEPENDENT_AMBULATORY_CARE_PROVIDER_SITE_OTHER): Payer: Medicaid Other | Admitting: Family

## 2020-11-07 VITALS — Ht 64.0 in | Wt 198.0 lb

## 2020-11-07 DIAGNOSIS — M19012 Primary osteoarthritis, left shoulder: Secondary | ICD-10-CM

## 2020-11-07 DIAGNOSIS — M19079 Primary osteoarthritis, unspecified ankle and foot: Secondary | ICD-10-CM

## 2020-11-07 NOTE — Progress Notes (Signed)
Post-Op Visit Note   Patient: Sandra Lozano           Date of Birth: 11-14-1961           MRN: [id number] Visit Date: 11/07/2020 PCP: Lavonda Jumbo, DO  Chief Complaint:  Chief Complaint  Patient presents with  . Left Foot - Routine Post Op    11/02/20 left midfoot fusion base 1st and 2nd MT     HPI:  HPI The patient is a 59 year old woman seen status post left midfoot fusion last Friday she is 5 days postop she is nonweightbearing in a wheelchair with a cam walker boot.  She states that she has been doing daily dressing changes since surgery.  Ortho Exam On examination of the left foot she has moderate swelling the incision is well approximated sutures there is no drainage no erythema no sign of infection  Visit Diagnoses:  1. Arthritis of midfoot     Plan: Continue daily Dial soap cleansing.  Dry dressing changes.  Nonweightbearing.  She will follow-up in 2 weeks with radiographs of the foot hope to harvest sutures at that time  Follow-Up Instructions: Return in about 2 weeks (around 11/21/2020).   Imaging: No results found.  Orders:  No orders of the defined types were placed in this encounter.  No orders of the defined types were placed in this encounter.    PMFS History: Patient Active Problem List   Diagnosis Date Noted  . Traumatic arthritis of left foot   . Skin lesion of scalp 06/04/2020  . Arthritis of midfoot 03/14/2020  . History of lumbar fusion 03/14/2020  . Neuropathy 11/07/2019  . Pre-diabetes 11/07/2019  . Nummular eczema 07/06/2019  . Lumbar stenosis 05/20/2019  . Acute left-sided low back pain without sciatica 07/18/2018  . Osteoarthritis of right hip 06/03/2018  . Chronic right shoulder pain 05/05/2018  . Right hip pain 05/05/2018  . Rupture of anterior cruciate ligament of right knee 02/15/2018  . Sprain of medial collateral ligament of right knee 02/15/2018  . Essential hypertension 02/15/2018   Past Medical History:  Diagnosis  Date  . Arthritis    right knee, lower back  . Dyspnea    very rare -tx with albuterol neb sol if needed  . Hyperlipidemia   . Hypertension   . Pre-diabetes    per patient - does not check blood sugar  . SVD (spontaneous vaginal delivery)    x 5 - only 2 living, 1 stillborn and 1 premature at 7 months demise,1 child deceased  . Wears dentures    upper and lower    History reviewed. No pertinent family history.  Past Surgical History:  Procedure Laterality Date  . FOOT ARTHRODESIS Left 11/02/2020   Procedure: LEFT MIDFOOT FUSION BASE 1ST AND 2ND METATARSAL;  Surgeon: Nadara Mustard, MD;  Location: [facility] OR;  Service: Orthopedics;  Laterality: Left;  . LUMBAR FUSION  05/20/2019   GILL PROCEDURE, LEFT TRANSFORAMINAL LUMBAR INTERBODY FUSION, PEDICLE INSTRUMENTATION, BILATERAL FUSION (N/A  . TUBAL LIGATION     Social History   Occupational History  . Not on file  Tobacco Use  . Smoking status: Former Smoker    Packs/day: 1.00    Years: 30.00    Pack years: 30.00    Types: Cigarettes    Quit date: 01/2015    Years since quitting: 5.8  . Smokeless tobacco: Never Used  Vaping Use  . Vaping Use: Never used  Substance and Sexual Activity  . Alcohol  use: Not Currently  . Drug use: Yes    Types: Marijuana    Comment: occassionally  . Sexual activity: Not on file

## 2020-11-14 DIAGNOSIS — Z419 Encounter for procedure for purposes other than remedying health state, unspecified: Secondary | ICD-10-CM | POA: Diagnosis not present

## 2020-11-21 ENCOUNTER — Ambulatory Visit (INDEPENDENT_AMBULATORY_CARE_PROVIDER_SITE_OTHER): Payer: Medicaid Other

## 2020-11-21 ENCOUNTER — Encounter: Payer: Self-pay | Admitting: Family

## 2020-11-21 ENCOUNTER — Ambulatory Visit (INDEPENDENT_AMBULATORY_CARE_PROVIDER_SITE_OTHER): Payer: Medicaid Other | Admitting: Family

## 2020-11-21 VITALS — Ht 64.0 in | Wt 198.0 lb

## 2020-11-21 DIAGNOSIS — M19072 Primary osteoarthritis, left ankle and foot: Secondary | ICD-10-CM | POA: Diagnosis not present

## 2020-11-21 DIAGNOSIS — M19079 Primary osteoarthritis, unspecified ankle and foot: Secondary | ICD-10-CM

## 2020-11-21 NOTE — Progress Notes (Signed)
Post-Op Visit Note   Patient: Sandra Lozano           Date of Birth: 04-03-1961           MRN: [id number] Visit Date: 11/21/2020 PCP: Lavonda Jumbo, DO  Chief Complaint:  Chief Complaint  Patient presents with  . Left Foot - Routine Post Op    11/02/20 left midfoot fusion base 1st and 2nd MT     HPI:  HPI The patient is a 59 year old woman who is seen today status post left midfoot fusion of the base of the first and second metatarsals.  She has been full weightbearing in a Cam walker.  Doing daily Dial soap cleansing and dry dressings.  Ortho Exam On examination of the left foot her incision is well approximated sutures this appears to be healing well there is no surrounding erythema no maceration no drainage no warmth very mild edema   Visit Diagnoses:  1. Arthritis of midfoot     Plan: We will hold off on harvesting sutures until next visit.  She will continue her cam walker discussed minimizing her weightbearing.  Follow-Up Instructions: Return in about 4 weeks (around 12/19/2020).   Imaging: No results found.  Orders:  No orders of the defined types were placed in this encounter.  No orders of the defined types were placed in this encounter.    PMFS History: Patient Active Problem List   Diagnosis Date Noted  . Traumatic arthritis of left foot   . Skin lesion of scalp 06/04/2020  . Arthritis of midfoot 03/14/2020  . History of lumbar fusion 03/14/2020  . Neuropathy 11/07/2019  . Pre-diabetes 11/07/2019  . Nummular eczema 07/06/2019  . Lumbar stenosis 05/20/2019  . Acute left-sided low back pain without sciatica 07/18/2018  . Osteoarthritis of right hip 06/03/2018  . Chronic right shoulder pain 05/05/2018  . Right hip pain 05/05/2018  . Rupture of anterior cruciate ligament of right knee 02/15/2018  . Sprain of medial collateral ligament of right knee 02/15/2018  . Essential hypertension 02/15/2018   Past Medical History:  Diagnosis Date  .  Arthritis    right knee, lower back  . Dyspnea    very rare -tx with albuterol neb sol if needed  . Hyperlipidemia   . Hypertension   . Pre-diabetes    per patient - does not check blood sugar  . SVD (spontaneous vaginal delivery)    x 5 - only 2 living, 1 stillborn and 1 premature at 7 months demise,1 child deceased  . Wears dentures    upper and lower    History reviewed. No pertinent family history.  Past Surgical History:  Procedure Laterality Date  . FOOT ARTHRODESIS Left 11/02/2020   Procedure: LEFT MIDFOOT FUSION BASE 1ST AND 2ND METATARSAL;  Surgeon: Nadara Mustard, MD;  Location: [facility];  Service: Orthopedics;  Laterality: Left;  . LUMBAR FUSION  05/20/2019   GILL PROCEDURE, LEFT TRANSFORAMINAL LUMBAR INTERBODY FUSION, PEDICLE INSTRUMENTATION, BILATERAL FUSION (N/A  . TUBAL LIGATION     Social History   Occupational History  . Not on file  Tobacco Use  . Smoking status: Former Smoker    Packs/day: 1.00    Years: 30.00    Pack years: 30.00    Types: Cigarettes    Quit date: 01/2015    Years since quitting: 5.8  . Smokeless tobacco: Never Used  Vaping Use  . Vaping Use: Never used  Substance and Sexual Activity  . Alcohol use: Not  Currently  . Drug use: Yes    Types: Marijuana    Comment: occassionally  . Sexual activity: Not on file

## 2020-11-28 ENCOUNTER — Ambulatory Visit (INDEPENDENT_AMBULATORY_CARE_PROVIDER_SITE_OTHER): Payer: Medicaid Other | Admitting: Physician Assistant

## 2020-11-28 ENCOUNTER — Encounter: Payer: Self-pay | Admitting: Physician Assistant

## 2020-11-28 VITALS — Ht 64.0 in | Wt 198.0 lb

## 2020-11-28 DIAGNOSIS — M19079 Primary osteoarthritis, unspecified ankle and foot: Secondary | ICD-10-CM

## 2020-11-28 NOTE — Progress Notes (Signed)
Office Visit Note   Patient: Gem Conkle           Date of Birth: 1961-06-25           MRN: [id number] Visit Date: 11/28/2020              Requested by: Lavonda Jumbo, DO [address] [place],  [place] [address] PCP: Lavonda Jumbo, DO  Chief Complaint  Patient presents with  . Left Foot - Routine Post Op    11/02/20 left midfoot fusion base 1st and 2nd MT       HPI: Patient presents today 4 weeks status post left midfoot fusion.  She is walking with a cane in her boot.  She says she has noticed a significant improvement and feels she can stand more normally  Assessment & Plan: Visit Diagnoses: No diagnosis found.  Plan: Should remain in the boot may weight-bear as tolerated with her cane.  We will follow up in 3 weeks at which time x-rays of her foot should be taken.  She understands not to push through painful symptoms  Follow-Up Instructions: No follow-ups on file.   Ortho Exam  Patient is alert, oriented, no adenopathy, well-dressed, normal affect, normal respiratory effort. Well-healed surgical incision.  Some altered sensation in her great toe but toes have good capillary refill.  Swelling is well controlled.  No cellulitis no drainage sutures intact which will be removed today  Imaging: No results found. No images are attached to the encounter.  Labs: Lab Results  Component Value Date   HGBA1C 6.1 08/22/2020   HGBA1C 6.3 11/01/2019     Lab Results  Component Value Date   ALBUMIN 4.0 05/18/2019    No results found for: MG No results found for: VD25OH  No results found for: PREALBUMIN CBC EXTENDED Latest Ref Rng & Units 11/02/2020 05/21/2019 05/18/2019  WBC 4.0 - 10.5 K/uL - 11.4(H) 10.5  RBC 3.87 - 5.11 MIL/uL - 3.35(L) 4.78  HGB 12.0 - 15.0 g/dL 56.4 3.3(I) 95.1  HCT 88.4 - 46.0 % 43.0 28.1(L) 41.2  PLT 150 - 400 K/uL - 284 356     Body mass index is 33.99 kg/m.  Orders:  No orders of the defined types were placed in this  encounter.  No orders of the defined types were placed in this encounter.    Procedures: No procedures performed  Clinical Data: No additional findings.  ROS:  All other systems negative, except as noted in the HPI. Review of Systems  Objective: Vital Signs: Ht 5\' 4"  (1.626 m)   Wt 198 lb (89.8 kg)   LMP  (LMP Unknown)   BMI 33.99 kg/m   Specialty Comments:  No specialty comments available.  PMFS History: Patient Active Problem List   Diagnosis Date Noted  . Traumatic arthritis of left foot   . Skin lesion of scalp 06/04/2020  . Arthritis of midfoot 03/14/2020  . History of lumbar fusion 03/14/2020  . Neuropathy 11/07/2019  . Pre-diabetes 11/07/2019  . Nummular eczema 07/06/2019  . Lumbar stenosis 05/20/2019  . Acute left-sided low back pain without sciatica 07/18/2018  . Osteoarthritis of right hip 06/03/2018  . Chronic right shoulder pain 05/05/2018  . Right hip pain 05/05/2018  . Rupture of anterior cruciate ligament of right knee 02/15/2018  . Sprain of medial collateral ligament of right knee 02/15/2018  . Essential hypertension 02/15/2018   Past Medical History:  Diagnosis Date  . Arthritis    right knee, lower back  .  Dyspnea    very rare -tx with albuterol neb sol if needed  . Hyperlipidemia   . Hypertension   . Pre-diabetes    per patient - does not check blood sugar  . SVD (spontaneous vaginal delivery)    x 5 - only 2 living, 1 stillborn and 1 premature at 7 months demise,1 child deceased  . Wears dentures    upper and lower    No family history on file.  Past Surgical History:  Procedure Laterality Date  . FOOT ARTHRODESIS Left 11/02/2020   Procedure: LEFT MIDFOOT FUSION BASE 1ST AND 2ND METATARSAL;  Surgeon: Nadara Mustard, MD;  Location: [facility];  Service: Orthopedics;  Laterality: Left;  . LUMBAR FUSION  05/20/2019   GILL PROCEDURE, LEFT TRANSFORAMINAL LUMBAR INTERBODY FUSION, PEDICLE INSTRUMENTATION, BILATERAL FUSION (N/A  . TUBAL  LIGATION     Social History   Occupational History  . Not on file  Tobacco Use  . Smoking status: Former Smoker    Packs/day: 1.00    Years: 30.00    Pack years: 30.00    Types: Cigarettes    Quit date: 01/2015    Years since quitting: 5.8  . Smokeless tobacco: Never Used  Vaping Use  . Vaping Use: Never used  Substance and Sexual Activity  . Alcohol use: Not Currently  . Drug use: Yes    Types: Marijuana    Comment: occassionally  . Sexual activity: Not on file

## 2020-12-04 ENCOUNTER — Other Ambulatory Visit: Payer: Self-pay

## 2020-12-04 ENCOUNTER — Telehealth: Payer: Self-pay | Admitting: Orthopedic Surgery

## 2020-12-04 NOTE — Telephone Encounter (Signed)
Letter ready for pick up sent message to advise in mychart.

## 2020-12-04 NOTE — Telephone Encounter (Signed)
Pt called and needs a note to go back to work December 27th. She messaged erin on [employer]

## 2020-12-06 ENCOUNTER — Other Ambulatory Visit: Payer: Self-pay | Admitting: Family Medicine

## 2020-12-10 ENCOUNTER — Other Ambulatory Visit: Payer: Self-pay | Admitting: Family Medicine

## 2020-12-10 ENCOUNTER — Encounter: Payer: Self-pay | Admitting: Family Medicine

## 2020-12-10 MED ORDER — MELOXICAM 7.5 MG PO TABS
7.5000 mg | ORAL_TABLET | Freq: Every day | ORAL | 0 refills | Status: DC
Start: 2020-12-10 — End: 2021-01-12

## 2020-12-13 ENCOUNTER — Telehealth: Payer: Self-pay | Admitting: Orthopedic Surgery

## 2020-12-13 NOTE — Telephone Encounter (Signed)
Carlota Raspberry [employer] from [employer] called stating that ADA forms were faxed over for Dr. Lajoyce Corners to fill out for pt to return to work. Carlota Raspberry stated that these forms must be filled out before patient can return to work. Please call Keia at [phone].

## 2020-12-13 NOTE — Telephone Encounter (Signed)
Form was faxed pt was return to work activity as tolerated to weight bear in boot no restrictions

## 2020-12-15 DIAGNOSIS — Z419 Encounter for procedure for purposes other than remedying health state, unspecified: Secondary | ICD-10-CM | POA: Diagnosis not present

## 2020-12-17 ENCOUNTER — Encounter: Payer: Self-pay | Admitting: Family Medicine

## 2020-12-20 ENCOUNTER — Ambulatory Visit (INDEPENDENT_AMBULATORY_CARE_PROVIDER_SITE_OTHER): Payer: Medicaid Other

## 2020-12-20 ENCOUNTER — Other Ambulatory Visit: Payer: Self-pay

## 2020-12-20 ENCOUNTER — Encounter: Payer: Self-pay | Admitting: Orthopedic Surgery

## 2020-12-20 ENCOUNTER — Ambulatory Visit (INDEPENDENT_AMBULATORY_CARE_PROVIDER_SITE_OTHER): Payer: Medicaid Other | Admitting: Physician Assistant

## 2020-12-20 DIAGNOSIS — M19072 Primary osteoarthritis, left ankle and foot: Secondary | ICD-10-CM

## 2020-12-20 DIAGNOSIS — M19079 Primary osteoarthritis, unspecified ankle and foot: Secondary | ICD-10-CM

## 2020-12-20 NOTE — Progress Notes (Signed)
Office Visit Note   Patient: Sandra Lozano           Date of Birth: Mar 13, 1961           MRN: [id number] Visit Date: 12/20/2020              Requested by: Lavonda Jumbo, DO [address] [place],  [place] [address] PCP: Lavonda Jumbo, DO  Chief Complaint  Patient presents with  . Left Foot - Follow-up      HPI: This is a pleasant woman who is 6 weeks status post left midfoot fusion at the base of the first and second tarsometatarsal joint.  She is overall improving.  She has found a shoe that she finds comfortable and supportive.  She also purchased an over-the-counter orthotic.  She has returned to work  Assessment & Plan: Visit Diagnoses:  1. Arthritis of midfoot     Plan: Continue in advancing activities as tolerated.  Follow-up in 1 month with x-rays of her foot.  Follow-Up Instructions: No follow-ups on file.   Ortho Exam  Patient is alert, oriented, no adenopathy, well-dressed, normal affect, normal respiratory effort. Examination demonstrates well-healed surgical incision.  Some mild altered sensation around the incision into her great toe.  Mild tenderness to palpation of the proximal end of the incision over the TMT joint.  No cellulitis no swelling.  Well-maintained alignment of her foot  Imaging: No results found. No images are attached to the encounter.  Labs: Lab Results  Component Value Date   HGBA1C 6.1 08/22/2020   HGBA1C 6.3 11/01/2019     Lab Results  Component Value Date   ALBUMIN 4.0 05/18/2019    No results found for: MG No results found for: VD25OH  No results found for: PREALBUMIN CBC EXTENDED Latest Ref Rng & Units 11/02/2020 05/21/2019 05/18/2019  WBC 4.0 - 10.5 K/uL - 11.4(H) 10.5  RBC 3.87 - 5.11 MIL/uL - 3.35(L) 4.78  HGB 12.0 - 15.0 g/dL 50.5 3.9(J) 67.3  HCT 41.9 - 46.0 % 43.0 28.1(L) 41.2  PLT 150 - 400 K/uL - 284 356     There is no height or weight on file to calculate BMI.  Orders:  Orders Placed This Encounter   Procedures  . XR Foot Complete Left   No orders of the defined types were placed in this encounter.    Procedures: No procedures performed  Clinical Data: No additional findings.  ROS:  All other systems negative, except as noted in the HPI. Review of Systems  Objective: Vital Signs: LMP  (LMP Unknown)   Specialty Comments:  No specialty comments available.  PMFS History: Patient Active Problem List   Diagnosis Date Noted  . Traumatic arthritis of left foot   . Skin lesion of scalp 06/04/2020  . Arthritis of midfoot 03/14/2020  . History of lumbar fusion 03/14/2020  . Neuropathy 11/07/2019  . Pre-diabetes 11/07/2019  . Nummular eczema 07/06/2019  . Lumbar stenosis 05/20/2019  . Acute left-sided low back pain without sciatica 07/18/2018  . Osteoarthritis of right hip 06/03/2018  . Chronic right shoulder pain 05/05/2018  . Right hip pain 05/05/2018  . Rupture of anterior cruciate ligament of right knee 02/15/2018  . Sprain of medial collateral ligament of right knee 02/15/2018  . Essential hypertension 02/15/2018   Past Medical History:  Diagnosis Date  . Arthritis    right knee, lower back  . Dyspnea    very rare -tx with albuterol neb sol if needed  .  Hyperlipidemia   . Hypertension   . Pre-diabetes    per patient - does not check blood sugar  . SVD (spontaneous vaginal delivery)    x 5 - only 2 living, 1 stillborn and 1 premature at 7 months demise,1 child deceased  . Wears dentures    upper and lower    No family history on file.  Past Surgical History:  Procedure Laterality Date  . FOOT ARTHRODESIS Left 11/02/2020   Procedure: LEFT MIDFOOT FUSION BASE 1ST AND 2ND METATARSAL;  Surgeon: Nadara Mustard, MD;  Location: [facility];  Service: Orthopedics;  Laterality: Left;  . LUMBAR FUSION  05/20/2019   GILL PROCEDURE, LEFT TRANSFORAMINAL LUMBAR INTERBODY FUSION, PEDICLE INSTRUMENTATION, BILATERAL FUSION (N/A  . TUBAL LIGATION     Social History    Occupational History  . Not on file  Tobacco Use  . Smoking status: Former Smoker    Packs/day: 1.00    Years: 30.00    Pack years: 30.00    Types: Cigarettes    Quit date: 01/2015    Years since quitting: 5.9  . Smokeless tobacco: Never Used  Vaping Use  . Vaping Use: Never used  Substance and Sexual Activity  . Alcohol use: Not Currently  . Drug use: Yes    Types: Marijuana    Comment: occassionally  . Sexual activity: Not on file

## 2020-12-24 ENCOUNTER — Encounter: Payer: Self-pay | Admitting: Orthopedic Surgery

## 2020-12-24 ENCOUNTER — Other Ambulatory Visit: Payer: Self-pay | Admitting: Physician Assistant

## 2020-12-24 MED ORDER — OXYCODONE-ACETAMINOPHEN 5-325 MG PO TABS: 1.0000 | ORAL_TABLET | ORAL | 0 refills | Status: AC | PRN

## 2021-01-12 ENCOUNTER — Other Ambulatory Visit: Payer: Self-pay | Admitting: Family Medicine

## 2021-01-14 MED ORDER — MELOXICAM 7.5 MG PO TABS
7.5000 mg | ORAL_TABLET | Freq: Every day | ORAL | 0 refills | Status: DC
Start: 2021-01-14 — End: 2021-02-15

## 2021-01-15 DIAGNOSIS — Z419 Encounter for procedure for purposes other than remedying health state, unspecified: Secondary | ICD-10-CM | POA: Diagnosis not present

## 2021-01-22 ENCOUNTER — Ambulatory Visit (INDEPENDENT_AMBULATORY_CARE_PROVIDER_SITE_OTHER): Payer: Medicaid Other | Admitting: Physician Assistant

## 2021-01-22 ENCOUNTER — Ambulatory Visit (INDEPENDENT_AMBULATORY_CARE_PROVIDER_SITE_OTHER): Payer: Medicaid Other

## 2021-01-22 ENCOUNTER — Encounter: Payer: Self-pay | Admitting: Physician Assistant

## 2021-01-22 DIAGNOSIS — M79672 Pain in left foot: Secondary | ICD-10-CM | POA: Diagnosis not present

## 2021-01-22 NOTE — Progress Notes (Signed)
Office Visit Note   Patient: Sandra Lozano           Date of Birth: May 21, 1961           MRN: [id number] Visit Date: 01/22/2021              Requested by: Lavonda Jumbo, DO [address] [place],  [place] [address] PCP: Lavonda Jumbo, DO  Chief Complaint  Patient presents with  . Left Foot - Routine Post Op    11/02/2020 midfoot fusion base 1st and 2nd MT       HPI: Patient is 11 weeks status post left midfoot fusion at the first TMT joint and over across the [place] complex.  She is overall doing well and feels better and is progressing to working out again.  She does listen to mindful symptoms and has been doing low impact fitness.  She feels more confident.  She does still get some tightness in her foot with certain shoewear  Assessment & Plan: Visit Diagnoses:  1. Pain in left foot     Plan: Patient will follow-up for final visit in 2 months x-rays of her foot should be taken at that time she may continue advance her activities of cautioned her to stay away from high-impact activities and anything that causes her more pain.  Follow-Up Instructions: No follow-ups on file.   Ortho Exam  Patient is alert, oriented, no adenopathy, well-dressed, normal affect, normal respiratory effort. Examination of her foot demonstrates palpable pulses.  Well-healed surgical incision mild soft tissue swelling no tenderness to palpation no cellulitis or signs of infection she still has a mild antalgic gait  Imaging: XR Foot Complete Left  Result Date: 01/22/2021 X-rays of her foot today demonstrate well-maintained alignment.  Hardware is intact and in place.  She still has visible joints space at the first TMT  No images are attached to the encounter.  Labs: Lab Results  Component Value Date   HGBA1C 6.1 08/22/2020   HGBA1C 6.3 11/01/2019     Lab Results  Component Value Date   ALBUMIN 4.0 05/18/2019    No results found for: MG No results found for: VD25OH  No  results found for: PREALBUMIN CBC EXTENDED Latest Ref Rng & Units 11/02/2020 05/21/2019 05/18/2019  WBC 4.0 - 10.5 K/uL - 11.4(H) 10.5  RBC 3.87 - 5.11 MIL/uL - 3.35(L) 4.78  HGB 12.0 - 15.0 g/dL 93.8 1.0(F) 75.1  HCT 02.5 - 46.0 % 43.0 28.1(L) 41.2  PLT 150 - 400 K/uL - 284 356     There is no height or weight on file to calculate BMI.  Orders:  Orders Placed This Encounter  Procedures  . XR Foot Complete Left   No orders of the defined types were placed in this encounter.    Procedures: No procedures performed  Clinical Data: No additional findings.  ROS:  All other systems negative, except as noted in the HPI. Review of Systems  Objective: Vital Signs: LMP  (LMP Unknown)   Specialty Comments:  No specialty comments available.  PMFS History: Patient Active Problem List   Diagnosis Date Noted  . Traumatic arthritis of left foot   . Skin lesion of scalp 06/04/2020  . Arthritis of midfoot 03/14/2020  . History of lumbar fusion 03/14/2020  . Neuropathy 11/07/2019  . Pre-diabetes 11/07/2019  . Nummular eczema 07/06/2019  . Lumbar stenosis 05/20/2019  . Acute left-sided low back pain without sciatica 07/18/2018  . Osteoarthritis of right hip 06/03/2018  .  Chronic right shoulder pain 05/05/2018  . Right hip pain 05/05/2018  . Rupture of anterior cruciate ligament of right knee 02/15/2018  . Sprain of medial collateral ligament of right knee 02/15/2018  . Essential hypertension 02/15/2018   Past Medical History:  Diagnosis Date  . Arthritis    right knee, lower back  . Dyspnea    very rare -tx with albuterol neb sol if needed  . Hyperlipidemia   . Hypertension   . Pre-diabetes    per patient - does not check blood sugar  . SVD (spontaneous vaginal delivery)    x 5 - only 2 living, 1 stillborn and 1 premature at 7 months demise,1 child deceased  . Wears dentures    upper and lower    No family history on file.  Past Surgical History:  Procedure Laterality  Date  . FOOT ARTHRODESIS Left 11/02/2020   Procedure: LEFT MIDFOOT FUSION BASE 1ST AND 2ND METATARSAL;  Surgeon: Nadara Mustard, MD;  Location: [facility];  Service: Orthopedics;  Laterality: Left;  . LUMBAR FUSION  05/20/2019   GILL PROCEDURE, LEFT TRANSFORAMINAL LUMBAR INTERBODY FUSION, PEDICLE INSTRUMENTATION, BILATERAL FUSION (N/A  . TUBAL LIGATION     Social History   Occupational History  . Not on file  Tobacco Use  . Smoking status: Former Smoker    Packs/day: 1.00    Years: 30.00    Pack years: 30.00    Types: Cigarettes    Quit date: 01/2015    Years since quitting: 6.0  . Smokeless tobacco: Never Used  Vaping Use  . Vaping Use: Never used  Substance and Sexual Activity  . Alcohol use: Not Currently  . Drug use: Yes    Types: Marijuana    Comment: occassionally  . Sexual activity: Not on file

## 2021-01-28 ENCOUNTER — Encounter: Payer: Self-pay | Admitting: Family Medicine

## 2021-01-29 MED ORDER — ATORVASTATIN CALCIUM 20 MG PO TABS
20.0000 mg | ORAL_TABLET | Freq: Every evening | ORAL | 0 refills | Status: DC
Start: 2021-01-29 — End: 2021-06-06

## 2021-02-05 ENCOUNTER — Encounter: Payer: Self-pay | Admitting: Family Medicine

## 2021-02-06 MED ORDER — LISINOPRIL-HYDROCHLOROTHIAZIDE 20-12.5 MG PO TABS
1.0000 | ORAL_TABLET | Freq: Every day | ORAL | 3 refills | Status: DC
Start: 2021-02-06 — End: 2021-04-08

## 2021-02-12 DIAGNOSIS — Z419 Encounter for procedure for purposes other than remedying health state, unspecified: Secondary | ICD-10-CM | POA: Diagnosis not present

## 2021-02-15 ENCOUNTER — Other Ambulatory Visit: Payer: Self-pay | Admitting: Family Medicine

## 2021-02-18 ENCOUNTER — Other Ambulatory Visit: Payer: Self-pay | Admitting: Family Medicine

## 2021-02-19 MED ORDER — MELOXICAM 7.5 MG PO TABS
7.5000 mg | ORAL_TABLET | Freq: Every day | ORAL | 0 refills | Status: DC
Start: 2021-02-19 — End: 2021-04-25

## 2021-02-24 ENCOUNTER — Other Ambulatory Visit: Payer: Self-pay | Admitting: Family Medicine

## 2021-02-24 ENCOUNTER — Encounter: Payer: Self-pay | Admitting: Family Medicine

## 2021-03-15 DIAGNOSIS — Z419 Encounter for procedure for purposes other than remedying health state, unspecified: Secondary | ICD-10-CM | POA: Diagnosis not present

## 2021-03-19 ENCOUNTER — Other Ambulatory Visit: Payer: Self-pay | Admitting: Family

## 2021-03-19 DIAGNOSIS — G629 Polyneuropathy, unspecified: Secondary | ICD-10-CM

## 2021-03-19 MED ORDER — GABAPENTIN 600 MG PO TABS: 600.0000 mg | ORAL_TABLET | Freq: Two times a day (BID) | ORAL | 2 refills | Status: DC

## 2021-03-27 ENCOUNTER — Ambulatory Visit (INDEPENDENT_AMBULATORY_CARE_PROVIDER_SITE_OTHER): Payer: Medicaid Other | Admitting: Physician Assistant

## 2021-03-27 ENCOUNTER — Ambulatory Visit (INDEPENDENT_AMBULATORY_CARE_PROVIDER_SITE_OTHER): Payer: Medicaid Other

## 2021-03-27 ENCOUNTER — Encounter: Payer: Self-pay | Admitting: Physician Assistant

## 2021-03-27 DIAGNOSIS — M79672 Pain in left foot: Secondary | ICD-10-CM

## 2021-03-27 NOTE — Progress Notes (Signed)
Office Visit Note   Patient: Sandra Lozano           Date of Birth: 1961/10/15           MRN: [id number] Visit Date: 03/27/2021              Requested by: Lavonda Jumbo, DO [address] [place],  [place] [address] PCP: Lavonda Jumbo, DO  No chief complaint on file.     HPI: Patient is 5 months status post left foot midfoot first and second TMT arthrodesis.  She has overall been doing well.  She did irritate her foot recently by doing exercises at home not wearing her tennis shoes.  She points to lateral to the midfoot incision as the area that was bothering her.  Overall she is very pleased with her surgery .  She does have some what she feels is inflammation on the plantar surface of her arch. Assessment & Plan: Visit Diagnoses:  1. Pain in left foot     Plan: Discussed with the patient wearing shoes with good arch supports.  Also discussed that she could use some topical Voltaren in areas of inflammation and her arch.  Told her to be careful about offloading too much to the lateral side of her foot  Follow-Up Instructions: No follow-ups on file.   Ortho Exam  Patient is alert, oriented, no adenopathy, well-dressed, normal affect, normal respiratory effort. She has easily palpable pulses well-healed dorsal foot incision no surrounding erythema minimal soft tissue swelling she is not tender over the area of fusion mildly tender over the fourth TMT.  Mild antalgic gait today  Imaging: XR Foot 2 Views Left  Result Date: 03/27/2021 X-rays of her left foot taken today demonstrate stable midfoot fusion.  No acute osseous changes from previous films  No images are attached to the encounter.  Labs: Lab Results  Component Value Date   HGBA1C 6.1 08/22/2020   HGBA1C 6.3 11/01/2019     Lab Results  Component Value Date   ALBUMIN 4.0 05/18/2019    No results found for: MG No results found for: VD25OH  No results found for: PREALBUMIN CBC EXTENDED Latest Ref Rng  & Units 11/02/2020 05/21/2019 05/18/2019  WBC 4.0 - 10.5 K/uL - 11.4(H) 10.5  RBC 3.87 - 5.11 MIL/uL - 3.35(L) 4.78  HGB 12.0 - 15.0 g/dL 07.8 6.7(J) 44.9  HCT 20.1 - 46.0 % 43.0 28.1(L) 41.2  PLT 150 - 400 K/uL - 284 356     There is no height or weight on file to calculate BMI.  Orders:  Orders Placed This Encounter  Procedures  . XR Foot 2 Views Left   No orders of the defined types were placed in this encounter.    Procedures: No procedures performed  Clinical Data: No additional findings.  ROS:  All other systems negative, except as noted in the HPI. Review of Systems  Objective: Vital Signs: LMP  (LMP Unknown)   Specialty Comments:  No specialty comments available.  PMFS History: Patient Active Problem List   Diagnosis Date Noted  . Traumatic arthritis of left foot   . Skin lesion of scalp 06/04/2020  . Arthritis of midfoot 03/14/2020  . History of lumbar fusion 03/14/2020  . Neuropathy 11/07/2019  . Pre-diabetes 11/07/2019  . Nummular eczema 07/06/2019  . Lumbar stenosis 05/20/2019  . Acute left-sided low back pain without sciatica 07/18/2018  . Osteoarthritis of right hip 06/03/2018  . Chronic right shoulder pain 05/05/2018  .  Right hip pain 05/05/2018  . Rupture of anterior cruciate ligament of right knee 02/15/2018  . Sprain of medial collateral ligament of right knee 02/15/2018  . Essential hypertension 02/15/2018   Past Medical History:  Diagnosis Date  . Arthritis    right knee, lower back  . Dyspnea    very rare -tx with albuterol neb sol if needed  . Hyperlipidemia   . Hypertension   . Pre-diabetes    per patient - does not check blood sugar  . SVD (spontaneous vaginal delivery)    x 5 - only 2 living, 1 stillborn and 1 premature at 7 months demise,1 child deceased  . Wears dentures    upper and lower    No family history on file.  Past Surgical History:  Procedure Laterality Date  . FOOT ARTHRODESIS Left 11/02/2020   Procedure:  LEFT MIDFOOT FUSION BASE 1ST AND 2ND METATARSAL;  Surgeon: Nadara Mustard, MD;  Location: [facility];  Service: Orthopedics;  Laterality: Left;  . LUMBAR FUSION  05/20/2019   GILL PROCEDURE, LEFT TRANSFORAMINAL LUMBAR INTERBODY FUSION, PEDICLE INSTRUMENTATION, BILATERAL FUSION (N/A  . TUBAL LIGATION     Social History   Occupational History  . Not on file  Tobacco Use  . Smoking status: Former Smoker    Packs/day: 1.00    Years: 30.00    Pack years: 30.00    Types: Cigarettes    Quit date: 01/2015    Years since quitting: 6.2  . Smokeless tobacco: Never Used  Vaping Use  . Vaping Use: Never used  Substance and Sexual Activity  . Alcohol use: Not Currently  . Drug use: Yes    Types: Marijuana    Comment: occassionally  . Sexual activity: Not on file

## 2021-04-08 ENCOUNTER — Telehealth: Payer: Self-pay

## 2021-04-08 ENCOUNTER — Other Ambulatory Visit: Payer: Self-pay

## 2021-04-08 ENCOUNTER — Ambulatory Visit (HOSPITAL_COMMUNITY)
Admission: RE | Admit: 2021-04-08 | Discharge: 2021-04-08 | Disposition: A | Discharge status: Home / Self Care | Payer: Medicaid Other | Source: Ambulatory Visit | Attending: Family Medicine | Admitting: Family Medicine

## 2021-04-08 ENCOUNTER — Ambulatory Visit (INDEPENDENT_AMBULATORY_CARE_PROVIDER_SITE_OTHER): Payer: Medicaid Other | Admitting: Family Medicine

## 2021-04-08 VITALS — BP 169/78 | HR 58 | Ht 64.0 in | Wt 209.2 lb

## 2021-04-08 DIAGNOSIS — R079 Chest pain, unspecified: Secondary | ICD-10-CM | POA: Diagnosis not present

## 2021-04-08 DIAGNOSIS — I10 Essential (primary) hypertension: Secondary | ICD-10-CM | POA: Diagnosis not present

## 2021-04-08 DIAGNOSIS — L989 Disorder of the skin and subcutaneous tissue, unspecified: Secondary | ICD-10-CM

## 2021-04-08 LAB — POCT SKIN KOH: Skin KOH, POC: NEGATIVE

## 2021-04-08 MED ORDER — LISINOPRIL-HYDROCHLOROTHIAZIDE 20-12.5 MG PO TABS: 2.0000 | ORAL_TABLET | Freq: Every day | ORAL | 3 refills | Status: DC

## 2021-04-08 MED ORDER — BLOOD PRESSURE CUFF MISC: 0 refills | Status: DC

## 2021-04-08 NOTE — Telephone Encounter (Signed)
Called pharmacy and gave verbal authorization for increased quantity.   Veronda Prude, RN

## 2021-04-08 NOTE — Patient Instructions (Addendum)
It was great seeing you today!  Today we increased your blood pressure medication from once a day to twice a day.   Because of concern for chest pain we checked an EKG which was normal.   I would like you to record your blood pressure at home- take it in the morning when you wake up and at night for several days.   For your scalp lesion we ruled out a fungal infection. We recommend continuing to use your topical steroids for the area, and if you would like to come to Derm clinic to have it biopsied, please schedule that at the front desk on your way out.  Please check-out at the front desk before leaving the clinic. I'd like to see you back in about 2 weeks to check your labs after increasing your blood pressure medication, but if you need to be seen earlier than that for any new issues we're happy to fit you in, just give [place] a call!  Visit Reminders: - Stop by the pharmacy to pick up your prescriptions  - Continue to work on your healthy eating habits and incorporating exercise into your daily life. - Medicine Changes: Take your lisinopril hydrochlorothiazide twice a day instead of once   If you haven't already, sign up for My Chart to have easy access to your labs results, and communication with your primary care physician.  Feel free to call with any questions or concerns at any time, at [phone].   Take care,  Dr. Cora Collum [facility]

## 2021-04-08 NOTE — Telephone Encounter (Signed)
Received phone call from pharmacist regarding rx for lisinopril-hctz. Rx was updated for patient to take 2 pills daily, however, quantity of 90 (45 day supply). Please advise if this is to be sent for 60 (one month supply) or 180 (3 month supply).   Forwarding to prescriber.   Veronda Prude, RN

## 2021-04-08 NOTE — Progress Notes (Signed)
    SUBJECTIVE:   CHIEF COMPLAINT / HPI:   Hair loss and itching: Sandra Lozano is a 60 yo who presents with complaints of itching of scalp and balding. Reports the area of hair loss also has a bump that itches intermittently. Has tried the steroid cream she uses for eczema without any relief. States the lesion may have increased in size since she was last seen in the clinic for it on 06/04/20  Chest pain: Occasional sharp pain under left breast that resolves when she raises her left arm. Denies worsening of pain on exertion or radiation of pain. Endorses feeling short of breath consistently. Denies current chest pain, palpitations, vision changes, headache.   Hypertension: Patient with elevated BP (169/78) and requesting to go up on her BP meds.  States she did some walking today which she says may contribute  Does not record BP at home Denies adding salt to diet or eating processed food  Eats mainly chicken and fish and occasional beef and pork   She endorses increase in physical activity   PERTINENT  PMH / PSH:  HTN, eczema  OBJECTIVE:   BP (!) 169/78   Pulse (!) 58   Ht 5\' 4"  (1.626 m)   Wt 209 lb 3.2 oz (94.9 kg)   LMP  (LMP Unknown)   SpO2 96%   BMI 35.91 kg/m    General: alert, pleasant, NAD Head: approx 4x4cm patch of hairloss with small hyperpigmented lesion in the center (pic below)  CV: RRR no murmurs  Resp: CTAB normal WOB      ASSESSMENT/PLAN:   No problem-specific Assessment & Plan notes found for this encounter.  Scalp lesion and itching Patient presents with over a year of hair loss and intermittent flares of pruritis. Unsure of etiology. Considered eczema as patient has a history of excema, though pruritis not relieved with steroid cream. Ruled out Tinea Capitis with negative KOH. Potentially seborrheic keratosis as lesion is hyperpigmented with a waxy like appearance. Patient agreed to be seen at [facility] clinic to possibly have lesion biopsied.  - f/u derm  clinic    Chest pain Patient with hx of pain under left breast that resolves with raising arm. EKG performed in clinic was normal. Currently asymptomatic. Could potentially be MSK related vs. Reflux vs PVCs.  - strict return precautions given - continue to monitor  - if symptoms worsen, can consider cardiology referral   Hypertension BP in clinic 169/78. No significant change on repeat measurement. Denies current CP, headaches, vision changes. Advised patient to take several blood pressure measurements at home.  - increase Zestoretic from once daily to BID  - f/u in 2 weeks to check labs after increase in medication   [facility], DO [facility]

## 2021-04-14 DIAGNOSIS — Z419 Encounter for procedure for purposes other than remedying health state, unspecified: Secondary | ICD-10-CM | POA: Diagnosis not present

## 2021-04-18 NOTE — Progress Notes (Signed)
     SUBJECTIVE:   CHIEF COMPLAINT / HPI:   Sandra Lozano is a 60 yo F who presents for the following:  Hypertension: - Medications: Zestoretic 20-12.5 mg/day; last visit was asked to take this medication twice daily she has not started this.  There was some confusion as to when her follow-up visits were. - Compliance: Yes - Denies any SOB, CP, vision changes, symptoms of hypotension  Scalp lesion Confusion about appointments patient thought today was her Derm clinic appointment.  She is concerned that scalp lesion is worsening and becoming more bumpy not necessarily more painful or growing in nature.  Hot flashes Patient would like more insight on how to manage hot flashes.  States that she has to take a fan to work with her and has a turn on when she gets hot.  When she cools off she is able to turn it off and return to normal.  She has been dealing with this for some time.  Symptoms are flushed and sweating.   Left chest wall pain Not currently experiencing pain.  Described as a sharp left-sided chest pain under the left breast.  Not necessarily associated with movement or certain foods.  Will eventually resolve on its own.  Patient unaware of timeframe.  Has been ongoing intermittently for 6 months.  Patient denies numbness or tingling down the left arm or centralized chest pain.  PERTINENT  PMH / PSH: Neuropathy, prediabetes, spinal stenosis  OBJECTIVE:   LMP  (LMP Unknown)   General: Appears well, no acute distress. Age appropriate. Cardiac: RRR, normal heart sounds, no murmurs Respiratory: CTAB, normal effort Extremities: No edema or cyanosis. Skin: Right sided scalp lesion with hyperpigmentation with centralized hardening. No surrounding erythema or drainage.  Psych: normal affect  ASSESSMENT/PLAN:   Essential hypertension Start taking new rx for increased dose of zestoretic. BMP future order. Follow up in 2 weeks for BP check and labs.   Chest wall pain Intermittent for 6  months and self resolves. Discussed keep diary of symptoms when episodes happen. Discussed red flags of when to go to ED such as chest pain, loss of sensation in left arm. Will follow up with symptom diary in 2 weeks.   Skin lesion of scalp Endorsing hardening of scalp lesion. Would like to have it biopsied. Derm clinic appt confirmed for 5/19.  Hot flashes Chronic. Postmenopausal. See AVS for instructions on maintenance for this issue.    Sandra Jumbo, DO [facility]

## 2021-04-19 ENCOUNTER — Encounter: Payer: Self-pay | Admitting: Family Medicine

## 2021-04-19 ENCOUNTER — Other Ambulatory Visit: Payer: Self-pay

## 2021-04-19 ENCOUNTER — Ambulatory Visit (INDEPENDENT_AMBULATORY_CARE_PROVIDER_SITE_OTHER): Payer: Medicaid Other | Admitting: Family Medicine

## 2021-04-19 VITALS — BP 143/65 | HR 65 | Ht 64.0 in | Wt 210.8 lb

## 2021-04-19 DIAGNOSIS — R232 Flushing: Secondary | ICD-10-CM

## 2021-04-19 DIAGNOSIS — L989 Disorder of the skin and subcutaneous tissue, unspecified: Secondary | ICD-10-CM | POA: Diagnosis not present

## 2021-04-19 DIAGNOSIS — I10 Essential (primary) hypertension: Secondary | ICD-10-CM

## 2021-04-19 DIAGNOSIS — R0789 Other chest pain: Secondary | ICD-10-CM | POA: Diagnosis not present

## 2021-04-19 NOTE — Patient Instructions (Addendum)
It was wonderful to see you today.  Please bring ALL of your medications with you to every visit.   Today we talked about:  Taking new medication.  And following up in 2 weeks with lab to check for kidney function.  If you have any dizziness please let us know what this could mean that your blood pressure is low.  We also discussed keeping a diary for the left-sided chest wall pain.  Following up with Derm clinic 5/19.  Please be sure to schedule follow up at the front  desk before you leave today.   If you haven't already, sign up for My Chart to have easy access to your labs results, and communication with your primary care physician.  Please call the clinic at [phone] if your symptoms worsen or you have any concerns. It was our pleasure to serve you.  Dr. Salvadore Dom   Health Maintenance for Postmenopausal Women Menopause is a normal process in which your ability to get pregnant comes to an end. This process happens slowly over many months or years, usually between the ages of 41 and 2. Menopause is complete when you have missed your menstrual periods for 12 months. It is important to talk with your health care provider about some of the most common conditions that affect women after menopause (postmenopausal women). These include heart disease, cancer, and bone loss (osteoporosis). Adopting a healthy lifestyle and getting preventive care can help to promote your health and wellness. The actions you take can also lower your chances of developing some of these common conditions. What should I know about menopause? During menopause, you may get a number of symptoms, such as:  Hot flashes. These can be moderate or severe.  Night sweats.  Decrease in sex drive.  Mood swings.  Headaches.  Tiredness.  Irritability.  Memory problems.  Insomnia. Choosing to treat or not to treat these symptoms is a decision that you make with your health care provider. Do I need hormone  replacement therapy?  Hormone replacement therapy is effective in treating symptoms that are caused by menopause, such as hot flashes and night sweats.  Hormone replacement carries certain risks, especially as you become older. If you are thinking about using estrogen or estrogen with progestin, discuss the benefits and risks with your health care provider. What is my risk for heart disease and stroke? The risk of heart disease, heart attack, and stroke increases as you age. One of the causes may be a change in the body's hormones during menopause. This can affect how your body uses dietary fats, triglycerides, and cholesterol. Heart attack and stroke are medical emergencies. There are many things that you can do to help prevent heart disease and stroke. Watch your blood pressure  High blood pressure causes heart disease and increases the risk of stroke. This is more likely to develop in people who have high blood pressure readings, are of African descent, or are overweight.  Have your blood pressure checked: ? Every 3-5 years if you are 50-67 years of age. ? Every year if you are 17 years old or older. Eat a healthy diet  Eat a diet that includes plenty of vegetables, fruits, low-fat dairy products, and lean protein.  Do not eat a lot of foods that are high in solid fats, added sugars, or sodium.   Get regular exercise Get regular exercise. This is one of the most important things you can do for your health. Most adults should:  Try to  exercise for at least 150 minutes each week. The exercise should increase your heart rate and make you sweat (moderate-intensity exercise).  Try to do strengthening exercises at least twice each week. Do these in addition to the moderate-intensity exercise.  Spend less time sitting. Even light physical activity can be beneficial. Other tips  Work with your health care provider to achieve or maintain a healthy weight.  Do not use any products that  contain nicotine or tobacco, such as cigarettes, e-cigarettes, and chewing tobacco. If you need help quitting, ask your health care provider.  Know your numbers. Ask your health care provider to check your cholesterol and your blood sugar (glucose). Continue to have your blood tested as directed by your health care provider. Do I need screening for cancer? Depending on your health history and family history, you may need to have cancer screening at different stages of your life. This may include screening for:  Breast cancer.  Cervical cancer.  Lung cancer.  Colorectal cancer. What is my risk for osteoporosis? After menopause, you may be at increased risk for osteoporosis. Osteoporosis is a condition in which bone destruction happens more quickly than new bone creation. To help prevent osteoporosis or the bone fractures that can happen because of osteoporosis, you may take the following actions:  If you are 64-56 years old, get at least 1,000 mg of calcium and at least 600 mg of vitamin D per day.  If you are older than age 25 but younger than age 41, get at least 1,200 mg of calcium and at least 600 mg of vitamin D per day.  If you are older than age 63, get at least 1,200 mg of calcium and at least 800 mg of vitamin D per day. Smoking and drinking excessive alcohol increase the risk of osteoporosis. Eat foods that are rich in calcium and vitamin D, and do weight-bearing exercises several times each week as directed by your health care provider. How does menopause affect my mental health? Depression may occur at any age, but it is more common as you become older. Common symptoms of depression include:  Low or sad mood.  Changes in sleep patterns.  Changes in appetite or eating patterns.  Feeling an overall lack of motivation or enjoyment of activities that you previously enjoyed.  Frequent crying spells. Talk with your health care provider if you think that you are experiencing  depression. General instructions See your health care provider for regular wellness exams and vaccines. This may include:  Scheduling regular health, dental, and eye exams.  Getting and maintaining your vaccines. These include: ? Influenza vaccine. Get this vaccine each year before the flu season begins. ? Pneumonia vaccine. ? Shingles vaccine. ? Tetanus, diphtheria, and pertussis (Tdap) booster vaccine. Your health care provider may also recommend other immunizations. Tell your health care provider if you have ever been abused or do not feel safe at home. Summary  Menopause is a normal process in which your ability to get pregnant comes to an end.  This condition causes hot flashes, night sweats, decreased interest in sex, mood swings, headaches, or lack of sleep.  Treatment for this condition may include hormone replacement therapy.  Take actions to keep yourself healthy, including exercising regularly, eating a healthy diet, watching your weight, and checking your blood pressure and blood sugar levels.  Get screened for cancer and depression. Make sure that you are up to date with all your vaccines. This information is not intended to replace  advice given to you by your health care provider. Make sure you discuss any questions you have with your health care provider. Document Revised: 11/24/2018 Document Reviewed: 11/24/2018 Elsevier Patient Education  2021 [employer].

## 2021-04-20 DIAGNOSIS — R232 Flushing: Secondary | ICD-10-CM | POA: Insufficient documentation

## 2021-04-20 DIAGNOSIS — R0789 Other chest pain: Secondary | ICD-10-CM

## 2021-04-20 HISTORY — DX: Other chest pain: R07.89

## 2021-04-20 NOTE — Assessment & Plan Note (Signed)
Intermittent for 6 months and self resolves. Discussed keep diary of symptoms when episodes happen. Discussed red flags of when to go to ED such as chest pain, loss of sensation in left arm. Will follow up with symptom diary in 2 weeks.

## 2021-04-20 NOTE — Assessment & Plan Note (Signed)
Start taking new rx for increased dose of zestoretic. BMP future order. Follow up in 2 weeks for BP check and labs.

## 2021-04-20 NOTE — Assessment & Plan Note (Signed)
Chronic. Postmenopausal. See AVS for instructions on maintenance for this issue.

## 2021-04-20 NOTE — Assessment & Plan Note (Signed)
Endorsing hardening of scalp lesion. Would like to have it biopsied. Derm clinic appt confirmed for 5/19.

## 2021-04-23 ENCOUNTER — Other Ambulatory Visit: Payer: Self-pay | Admitting: Family Medicine

## 2021-05-02 ENCOUNTER — Other Ambulatory Visit: Payer: Self-pay

## 2021-05-02 ENCOUNTER — Ambulatory Visit (INDEPENDENT_AMBULATORY_CARE_PROVIDER_SITE_OTHER): Payer: Medicaid Other | Admitting: Family Medicine

## 2021-05-02 VITALS — BP 121/80 | HR 89 | Ht 66.0 in | Wt 210.4 lb

## 2021-05-02 DIAGNOSIS — L989 Disorder of the skin and subcutaneous tissue, unspecified: Secondary | ICD-10-CM | POA: Diagnosis not present

## 2021-05-02 NOTE — Progress Notes (Signed)
    SUBJECTIVE:   CHIEF COMPLAINT / HPI:   Scalp lesion: Patient is a 60 year old female who presents to dermatology clinic today to discuss a scalp lesion.  She states: She has an area of itchiness over the right lateral and posterior aspect of her scalp for about 2 years.  She states that the ear in this region does not really scratches the hair elsewhere and so she keeps her hair cut short.  She has attempted to die her hair and states that this area does not take her diet very well.  She denies any pain in the region.  She has tried some eczema based treatments as well as some various oils with limited success.  Her main concern is the itchiness of the scalp.   PERTINENT  PMH / PSH: None relevant  OBJECTIVE:   BP 121/80   Pulse 89   Ht 5\' 6"  (1.676 m)   Wt 210 lb 6.4 oz (95.4 kg)   LMP  (LMP Unknown)   SpO2 96%   BMI 33.96 kg/m    General: NAD, pleasant, able to participate in exam Derm: Small lesion that is hyperpigmented and is approximately 3 mm in diameter.  This lesion is raised with some scale present.  This lesion was observed through dermatoscope with the scale more present.  The remainder of the scalp exam shows no obvious scale or erythema present though she does have some areas of alopecia with hair shorter in these regions than the remainder of her hair        Procedure note: Punch biopsy: Informed consent was gathered, signed, placed in the patient's chart.  The area was cleaned with Betadine x2 and an alcohol swab.  It was anesthetized with approximately 1 cc of lidocaine with epi.  After analgesia was achieved a 2 mm biopsy was taken of the anterior/inferior aspect of the dark lesion as seen above.  There was minimal blood loss.  Silver nitrate was used and a Band-Aid was applied.  Patient tolerated the procedure well.  ASSESSMENT/PLAN:   Skin lesion of scalp Assessment: 60 year old female with an area of pruritus to the right lateral and posterior aspect of  her hair.  There is no obvious scale present in the region.  There is 1 small lesion which appears to be a healed up area of excoriation.  She is unsure how long this area has been present.  Overall differential can include fungal etiology versus lichen planopilaris.  Most likely seborrheic dermatitis or chronic pseudofolliculitis with the current appearance.  KOH scraping was negative for any signs of fungus.  Because of the single lesion present that can be dermatofibroma versus melanoma versus healing excoriation a punch biopsy of that lesion was taken.  See procedure note above.  Patient will receive the results for this in the next 1-2 weeks and we will determine neck steps at that time.    76, DO [facility]    This note was prepared using Dragon voice recognition software and may include unintentional dictation errors due to the inherent limitations of voice recognition software.

## 2021-05-02 NOTE — Patient Instructions (Addendum)
Skin Biopsy, Care After This sheet gives you information about how to care for yourself after your procedure. Your health care provider may also give you more specific instructions. If you have problems or questions, contact your health care provider. What can I expect after the procedure? After the procedure, it is common to have:  Soreness.  Bruising.  Itching. Follow these instructions at home: Biopsy site care Follow instructions from your health care provider about how to take care of your biopsy site. Make sure you:  Wash your hands with soap and water before and after you change your bandage (dressing). If soap and water are not available, use hand sanitizer.  Apply ointment on your biopsy site as directed by your health care provider.  Change your dressing as told by your health care provider.  Leave stitches (sutures), skin glue, or adhesive strips in place. These skin closures may need to stay in place for 2 weeks or longer. If adhesive strip edges start to loosen and curl up, you may trim the loose edges. Do not remove adhesive strips completely unless your health care provider tells you to do that.  If the biopsy area bleeds, apply gentle pressure for 10 minutes. Check your biopsy site every day for signs of infection. Check for:  Redness, swelling, or pain.  Fluid or blood.  Warmth.  Pus or a bad smell.   General instructions  Rest and then return to your normal activities as told by your health care provider.  Take over-the-counter and prescription medicines only as told by your health care provider.  Keep all follow-up visits as told by your health care provider. This is important. Contact a health care provider if:  You have redness, swelling, or pain around your biopsy site.  You have fluid or blood coming from your biopsy site.  Your biopsy site feels warm to the touch.  You have pus or a bad smell coming from your biopsy site.  You have a  fever.  Your sutures, skin glue, or adhesive strips loosen or come off sooner than expected. Get help right away if:  You have bleeding that does not stop with pressure or a dressing. Summary  After the procedure, it is common to have soreness, bruising, and itching at the site.  Follow instructions from your health care provider about how to take care of your biopsy site.  Check your biopsy site every day for signs of infection.  Contact a health care provider if you have redness, swelling, or pain around your biopsy site, or your biopsy site feels warm to the touch.  Keep all follow-up visits as told by your health care provider. This is important. This information is not intended to replace advice given to you by your health care provider. Make sure you discuss any questions you have with your health care provider. Document Revised: 05/31/2018 Document Reviewed: 05/31/2018 Elsevier Patient Education  2021 [employer].  Today we took a punch biopsy of your scalp.  This should be back from pathology in the next 1 to 2 weeks.  If you do not hear from [place] in the next 1 to 2 weeks please let us know.  Once we get the results back we can determine next steps.  We also looked at the area for signs of fungal infection which was negative.

## 2021-05-02 NOTE — Assessment & Plan Note (Signed)
Assessment: 60 year old female with an area of pruritus to the right lateral and posterior aspect of her hair.  There is no obvious scale present in the region.  There is 1 small lesion which appears to be a healed up area of excoriation.  She is unsure how long this area has been present.  Overall differential can include fungal etiology versus lichen planopilaris.  Most likely seborrheic dermatitis or chronic pseudofolliculitis with the current appearance.  KOH scraping was negative for any signs of fungus.  Because of the single lesion present that can be dermatofibroma versus melanoma versus healing excoriation a punch biopsy of that lesion was taken.  See procedure note above.  Patient will receive the results for this in the next 1-2 weeks and we will determine neck steps at that time.

## 2021-05-09 NOTE — Progress Notes (Signed)
    SUBJECTIVE:   CHIEF COMPLAINT / HPI:   Sandra Lozano is a 60 yo F who presents for the following:  BP check - Medications: Zestoretic 20-12.5 mg BID  - Compliance: Yes - Denies any SOB, CP, vision changes, LE edema, medication SEs, or symptoms of hypotension  PERTINENT  PMH / PSH:   OBJECTIVE:   BP 124/80   Pulse 87   Ht 5\' 4"  (1.626 m)   Wt 210 lb 12.8 oz (95.6 kg)   LMP  (LMP Unknown)   SpO2 98%   BMI 36.18 kg/m   General: Appears well, no acute distress. Age appropriate. Cardiac: RRR, normal heart sounds, no murmurs Respiratory: CTAB, normal effort  ASSESSMENT/PLAN:   Essential hypertension Controlled with increased dose of zestoretic.  -F/u BMP -Continue current medication   , DO [facility]

## 2021-05-10 ENCOUNTER — Ambulatory Visit (INDEPENDENT_AMBULATORY_CARE_PROVIDER_SITE_OTHER): Payer: Medicaid Other | Admitting: Family Medicine

## 2021-05-10 ENCOUNTER — Encounter: Payer: Self-pay | Admitting: Family Medicine

## 2021-05-10 ENCOUNTER — Other Ambulatory Visit: Payer: Self-pay

## 2021-05-10 DIAGNOSIS — I10 Essential (primary) hypertension: Secondary | ICD-10-CM

## 2021-05-10 NOTE — Assessment & Plan Note (Signed)
Controlled with increased dose of zestoretic.  -F/u BMP -Continue current medication

## 2021-05-11 LAB — BASIC METABOLIC PANEL
BUN/Creatinine Ratio: 20 (ref 12–28)
BUN: 13 mg/dL (ref 8–27)
CO2: 24 mmol/L (ref 20–29)
Calcium: 9.6 mg/dL (ref 8.7–10.3)
Chloride: 101 mmol/L (ref 96–106)
Creatinine, Ser: 0.66 mg/dL (ref 0.57–1.00)
Glucose: 123 mg/dL — ABNORMAL HIGH (ref 65–99)
Potassium: 4.3 mmol/L (ref 3.5–5.2)
Sodium: 142 mmol/L (ref 134–144)
eGFR: 100 mL/min/{1.73_m2} (ref >59)

## 2021-05-15 ENCOUNTER — Other Ambulatory Visit: Payer: Self-pay | Admitting: Family Medicine

## 2021-05-15 DIAGNOSIS — Z419 Encounter for procedure for purposes other than remedying health state, unspecified: Secondary | ICD-10-CM | POA: Diagnosis not present

## 2021-05-15 MED ORDER — TRIAMCINOLONE ACETONIDE 0.1 % EX OINT: 1.0000 "application " | TOPICAL_OINTMENT | Freq: Two times a day (BID) | CUTANEOUS | 0 refills | Status: DC

## 2021-05-21 NOTE — Telephone Encounter (Signed)
Do you mind calling and offering appiontment

## 2021-05-23 ENCOUNTER — Encounter: Payer: Self-pay | Admitting: Orthopedic Surgery

## 2021-05-23 ENCOUNTER — Ambulatory Visit: Payer: Self-pay

## 2021-05-23 ENCOUNTER — Ambulatory Visit (INDEPENDENT_AMBULATORY_CARE_PROVIDER_SITE_OTHER): Payer: Medicaid Other | Admitting: Orthopedic Surgery

## 2021-05-23 DIAGNOSIS — M79672 Pain in left foot: Secondary | ICD-10-CM | POA: Diagnosis not present

## 2021-05-23 DIAGNOSIS — S92912G Unspecified fracture of left toe(s), subsequent encounter for fracture with delayed healing: Secondary | ICD-10-CM | POA: Diagnosis not present

## 2021-05-23 NOTE — Progress Notes (Signed)
Office Visit Note   Patient: Sandra Lozano           Date of Birth: 02-29-1961           MRN: [id number] Visit Date: 05/23/2021              Requested by: Lavonda Jumbo, DO [address] [place],  [place] [address] PCP: Lavonda Jumbo, DO  Chief Complaint  Patient presents with   Left Foot - Follow-up    Hx for midfoot fusion base 1st and 2nd MT 10/2020      HPI: Patient is a 60 year old woman who notices swelling and acute pain third toe left foot she states has been present for 3 weeks she denies any specific injury.  She is currently wearing slippers.  Patient denies a history of gout.  Assessment & Plan: Visit Diagnoses:  1. Pain in left foot   2. Closed fracture dislocation of interphalangeal joint of single toe with delayed healing, left     Plan: We will place patient in a postoperative shoe follow-up in 3 weeks with repeat radiographs.  Discussed that if the intra-articular fracture does not heal and pain resolved we may need to proceed with PIP joint resection.  Follow-Up Instructions: Return in about 3 weeks (around 06/13/2021).   Ortho Exam  Patient is alert, oriented, no adenopathy, well-dressed, normal affect, normal respiratory effort. Examination patient has a palpable pulse she has a healed incision from her Lisfranc internal fixation.  She has swelling with fixed clawing of the lesser toes swelling primarily at the PIP joint of the third toe.  There are no ulcers she has extreme tenderness to palpation with radiographs showing an intra-articular fracture.  Imaging: XR Foot Complete Left  Result Date: 05/23/2021 Three-view radiographs of the left foot shows a fracture of the PIP joint third toe.  The internal fixation of the Lisfranc stabilization is stable.  No images are attached to the encounter.  Labs: Lab Results  Component Value Date   HGBA1C 6.1 08/22/2020   HGBA1C 6.3 11/01/2019     Lab Results  Component Value Date   ALBUMIN 4.0  05/18/2019    No results found for: MG No results found for: VD25OH  No results found for: PREALBUMIN CBC EXTENDED Latest Ref Rng & Units 11/02/2020 05/21/2019 05/18/2019  WBC 4.0 - 10.5 K/uL - 11.4(H) 10.5  RBC 3.87 - 5.11 MIL/uL - 3.35(L) 4.78  HGB 12.0 - 15.0 g/dL 19.5 0.9(T) 26.7  HCT 12.4 - 46.0 % 43.0 28.1(L) 41.2  PLT 150 - 400 K/uL - 284 356     There is no height or weight on file to calculate BMI.  Orders:  Orders Placed This Encounter  Procedures   XR Foot Complete Left   No orders of the defined types were placed in this encounter.    Procedures: No procedures performed  Clinical Data: No additional findings.  ROS:  All other systems negative, except as noted in the HPI. Review of Systems  Objective: Vital Signs: LMP  (LMP Unknown)   Specialty Comments:  No specialty comments available.  PMFS History: Patient Active Problem List   Diagnosis Date Noted   Chest wall pain 04/20/2021   Hot flashes 04/20/2021   Traumatic arthritis of left foot    Skin lesion of scalp 06/04/2020   Arthritis of midfoot 03/14/2020   History of lumbar fusion 03/14/2020   Neuropathy 11/07/2019   Pre-diabetes 11/07/2019   Nummular eczema 07/06/2019   Lumbar stenosis  05/20/2019   Acute left-sided low back pain without sciatica 07/18/2018   Osteoarthritis of right hip 06/03/2018   Chronic right shoulder pain 05/05/2018   Right hip pain 05/05/2018   Rupture of anterior cruciate ligament of right knee 02/15/2018   Sprain of medial collateral ligament of right knee 02/15/2018   Essential hypertension 02/15/2018   Past Medical History:  Diagnosis Date   Arthritis    right knee, lower back   Dyspnea    very rare -tx with albuterol neb sol if needed   Hyperlipidemia    Hypertension    Pre-diabetes    per patient - does not check blood sugar   SVD (spontaneous vaginal delivery)    x 5 - only 2 living, 1 stillborn and 1 premature at 7 months demise,1 child deceased    Wears dentures    upper and lower    No family history on file.  Past Surgical History:  Procedure Laterality Date   FOOT ARTHRODESIS Left 11/02/2020   Procedure: LEFT MIDFOOT FUSION BASE 1ST AND 2ND METATARSAL;  Surgeon: Nadara Mustard, MD;  Location: [facility] OR;  Service: Orthopedics;  Laterality: Left;   LUMBAR FUSION  05/20/2019   GILL PROCEDURE, LEFT TRANSFORAMINAL LUMBAR INTERBODY FUSION, PEDICLE INSTRUMENTATION, BILATERAL FUSION (N/A   TUBAL LIGATION     Social History   Occupational History   Not on file  Tobacco Use   Smoking status: Former    Packs/day: 1.00    Years: 30.00    Pack years: 30.00    Types: Cigarettes    Quit date: 01/2015    Years since quitting: 6.3   Smokeless tobacco: Never  Vaping Use   Vaping Use: Never used  Substance and Sexual Activity   Alcohol use: Not Currently   Drug use: Yes    Types: Marijuana    Comment: occassionally   Sexual activity: Not on file

## 2021-05-24 ENCOUNTER — Encounter: Payer: Self-pay | Admitting: Orthopedic Surgery

## 2021-06-05 ENCOUNTER — Other Ambulatory Visit: Payer: Self-pay | Admitting: Family Medicine

## 2021-06-13 ENCOUNTER — Ambulatory Visit (INDEPENDENT_AMBULATORY_CARE_PROVIDER_SITE_OTHER): Payer: Medicaid Other | Admitting: Physician Assistant

## 2021-06-13 ENCOUNTER — Encounter: Payer: Self-pay | Admitting: Orthopedic Surgery

## 2021-06-13 ENCOUNTER — Ambulatory Visit (INDEPENDENT_AMBULATORY_CARE_PROVIDER_SITE_OTHER): Payer: Medicaid Other

## 2021-06-13 DIAGNOSIS — S92912G Unspecified fracture of left toe(s), subsequent encounter for fracture with delayed healing: Secondary | ICD-10-CM

## 2021-06-13 DIAGNOSIS — M79672 Pain in left foot: Secondary | ICD-10-CM

## 2021-06-13 NOTE — Progress Notes (Signed)
Office Visit Note   Patient: Sandra Lozano           Date of Birth: [birth year]           MRN: [id number] Visit Date: 06/13/2021              Requested by: Lavonda Jumbo, DO [address] [place],  [place] [address] PCP: Lavonda Jumbo, DO  Chief Complaint  Patient presents with   Left Foot - Follow-up      HPI: Patient is a pleasant 60 year old woman who is here in follow-up for her left foot.  She has a history of first second TMT fusions.  At her last visit she complained of third toe pain and was diagnosed with a fracture at the PIP joint.  She was given a postop shoe.  She says she really did not wear that.  She has since wearing a Gaffer.  She also complains that she has difficulty on the medial side of her foot when she tries to walk barefoot and she gets some numbness in her plantar forefoot She says her toe does not really bother her anymore Assessment & Plan: Visit Diagnoses:  1. Pain in left foot   2. Closed fracture dislocation of interphalangeal joint of single toe with delayed healing, left     Plan: I directed her in some Achilles stretching exercises.  I also have asked that she try to put some inserts in her sheet sneakers.  She is going to try this we will follow-up in 4 weeks if she is no better  Follow-Up Instructions: No follow-ups on file.   Ortho Exam  Patient is alert, oriented, no adenopathy, well-dressed, normal affect, normal respiratory effort. Left foot well-healed surgical incision she does have some pes planus.  She complains of numbness across the plantar forefoot.  With the knee extended she barely gets to neutral.  She has no cellulitis no erythema palpation of the third toe is minimally tender.  She has some mild soft tissue swelling there but no ascending cellulitis pulses are palpable  Imaging: XR Foot Complete Left  Result Date: 06/13/2021 2 views of her foot demonstrate overall well-maintained alignment postsurgical  changes from her fusion third toe fracture at the PIP joint is stable  No images are attached to the encounter.  Labs: Lab Results  Component Value Date   HGBA1C 6.1 08/22/2020   HGBA1C 6.3 11/01/2019     Lab Results  Component Value Date   ALBUMIN 4.0 05/18/2019    No results found for: MG No results found for: VD25OH  No results found for: PREALBUMIN CBC EXTENDED Latest Ref Rng & Units 11/02/2020 05/21/2019 05/18/2019  WBC 4.0 - 10.5 K/uL - 11.4(H) 10.5  RBC 3.87 - 5.11 MIL/uL - 3.35(L) 4.78  HGB 12.0 - 15.0 g/dL 56.3 8.7(F) 64.3  HCT 32.9 - 46.0 % 43.0 28.1(L) 41.2  PLT 150 - 400 K/uL - 284 356     There is no height or weight on file to calculate BMI.  Orders:  Orders Placed This Encounter  Procedures   XR Foot Complete Left   No orders of the defined types were placed in this encounter.    Procedures: No procedures performed  Clinical Data: No additional findings.  ROS:  All other systems negative, except as noted in the HPI. Review of Systems  Objective: Vital Signs: LMP  (LMP Unknown)   Specialty Comments:  No specialty comments available.  PMFS History: Patient Active Problem  List   Diagnosis Date Noted   Chest wall pain 04/20/2021   Hot flashes 04/20/2021   Traumatic arthritis of left foot    Skin lesion of scalp 06/04/2020   Arthritis of midfoot 03/14/2020   History of lumbar fusion 03/14/2020   Neuropathy 11/07/2019   Pre-diabetes 11/07/2019   Nummular eczema 07/06/2019   Lumbar stenosis 05/20/2019   Acute left-sided low back pain without sciatica 07/18/2018   Osteoarthritis of right hip 06/03/2018   Chronic right shoulder pain 05/05/2018   Right hip pain 05/05/2018   Rupture of anterior cruciate ligament of right knee 02/15/2018   Sprain of medial collateral ligament of right knee 02/15/2018   Essential hypertension 02/15/2018   Past Medical History:  Diagnosis Date   Arthritis    right knee, lower back   Dyspnea    very rare  -tx with albuterol neb sol if needed   Hyperlipidemia    Hypertension    Pre-diabetes    per patient - does not check blood sugar   SVD (spontaneous vaginal delivery)    x 5 - only 2 living, 1 stillborn and 1 premature at 7 months demise,1 child deceased   Wears dentures    upper and lower    No family history on file.  Past Surgical History:  Procedure Laterality Date   FOOT ARTHRODESIS Left 11/02/2020   Procedure: LEFT MIDFOOT FUSION BASE 1ST AND 2ND METATARSAL;  Surgeon: Nadara Mustard, MD;  Location: [facility] OR;  Service: Orthopedics;  Laterality: Left;   LUMBAR FUSION  05/20/2019   GILL PROCEDURE, LEFT TRANSFORAMINAL LUMBAR INTERBODY FUSION, PEDICLE INSTRUMENTATION, BILATERAL FUSION (N/A   TUBAL LIGATION     Social History   Occupational History   Not on file  Tobacco Use   Smoking status: Former    Packs/day: 1.00    Years: 30.00    Pack years: 30.00    Types: Cigarettes    Quit date: 01/2015    Years since quitting: 6.4   Smokeless tobacco: Never  Vaping Use   Vaping Use: Never used  Substance and Sexual Activity   Alcohol use: Not Currently   Drug use: Yes    Types: Marijuana    Comment: occassionally   Sexual activity: Not on file

## 2021-06-14 DIAGNOSIS — Z419 Encounter for procedure for purposes other than remedying health state, unspecified: Secondary | ICD-10-CM | POA: Diagnosis not present

## 2021-06-20 NOTE — Telephone Encounter (Signed)
Records were shredded.

## 2021-07-15 DIAGNOSIS — Z419 Encounter for procedure for purposes other than remedying health state, unspecified: Secondary | ICD-10-CM | POA: Diagnosis not present

## 2021-07-20 ENCOUNTER — Other Ambulatory Visit: Payer: Self-pay | Admitting: Family Medicine

## 2021-07-20 ENCOUNTER — Other Ambulatory Visit: Payer: Self-pay | Admitting: Family

## 2021-07-20 DIAGNOSIS — G629 Polyneuropathy, unspecified: Secondary | ICD-10-CM

## 2021-08-05 ENCOUNTER — Other Ambulatory Visit: Payer: Self-pay | Admitting: *Deleted

## 2021-08-05 NOTE — Patient Instructions (Signed)
Visit Information  Ms. Politte was given information about Medicaid Managed Care team care coordination services as a part of their [facility] Medicaid benefit. Tajanay Hurley verbally consented to engagement with the [facility] Managed Care team.   If you are experiencing a medical emergency, please call 911 or report to your local emergency department or urgent care.   If you have a non-emergency medical problem during routine business hours, please contact your provider's office and ask to speak with a nurse.   For questions related to your [facility] health plan, please call: [phone] or go here:[email or web address]  If you would like to schedule transportation through your [facility] plan, please call the following number at least 2 days in advance of your appointment: [phone].  Call the [facility] at [phone], at any time, 24 hours a day, 7 days a week. If you are in danger or need immediate medical attention call 911.  If you would like help to quit smoking, call 1-800-QUIT-NOW (219)434-7374) OR Espaol: 1-855-Djelo-Ya (5-726-203-5597) o para ms informacin haga clic aqu or Text READY to 200-400 to register via text  Ms. Bublitz - following are the goals we discussed in your visit today:   Goals Addressed             This Visit's Progress    Cope with Chronic Pain       Timeframe:  Long-Range Goal Priority:  High Start Date:  08/05/21                           Expected End Date:    ongoing                   Follow Up Date 09/02/21    - practice acceptance of chronic pain - use distraction techniques- - continue my daily exercise routine   Why is this important?   Stress makes chronic pain feel worse.  Feelings like depression, anxiety, stress and anger can make your body more sensitive to pain.  Learning ways to cope with stress or depression may help you find some relief from the pain.     Notes:      Learn More About  My Health       Timeframe:  Long-Range Goal Priority:  High Start Date:  08/05/21                           Expected End Date:      ongoing                  Follow Up Date 09/02/21    - make a list of questions - ask questions -review educational material mailed to me on managing HTN    Why is this important?   The best way to learn about your health and care is by talking to the doctor and nurse.  They will answer your questions and give you information in the way that you like best.    Notes:      Track and Manage My Blood Pressure-Hypertension       Timeframe:  Long-Range Goal Priority:  High Start Date:     08/05/21                        Expected End Date:   onging  Follow Up Date 09/02/21    - check blood pressure weekly - write blood pressure results in a log or diary - use [facility] [employer] spiral bound day planner to record home BP readings   Why is this important?   You won't feel high blood pressure, but it can still hurt your blood vessels.  High blood pressure can cause heart or kidney problems. It can also cause a stroke.  Making lifestyle changes like losing a little weight or eating less salt will help.  Checking your blood pressure at home and at different times of the day can help to control blood pressure.  If the doctor prescribes medicine remember to take it the way the doctor ordered.  Call the office if you cannot afford the medicine or if there are questions about it.     Notes:         Please see education materials related to HTN provided as print materials.   The patient verbalized understanding of instructions provided today and agreed to receive a mailed copy of patient instruction and/or educational materials.  Telephone follow up appointment with Managed Medicaid care management team member scheduled for:09/02/21 at 2:00 pm  Kelli Churn RN, CCM, [place]  Management Coordinator - Managed [place] High Risk [phone]   Following is a copy of your plan of care:  Patient Care Plan: RN Care Manager Plan Of Care     Problem Identified: Chronic Disease Management and Care Coordination Needs      Goal: Development of Plan Of Care For Chronic Disease Management and Care Coordination Needs To Assist With Meeting Treatment Goals   Start Date: 08/05/2021  Expected End Date: 12/03/2021  Priority: High  Note:   Current Barriers:  Chronic Disease Management support and education needs related to HTN  RNCM Clinical Goal(s):  Patient will verbalize understanding of plan for management of HTN through collaboration with RN Care manager, provider, and care team.   Interventions: Inter-disciplinary care team collaboration (see longitudinal plan of care) Evaluation of current treatment plan related to  self management and patient's adherence to plan as established by provider   Hypertension: (Status: New goal.) Last practice recorded BP readings:  BP Readings from Last 3 Encounters:  05/10/21 124/80  05/02/21 121/80  04/19/21 (!) 143/65  Most recent eGFR/CrCl:  Lab Results  Component Value Date   EGFR 100 05/10/2021    No components found for: CRCL  Evaluation of current treatment plan related to hypertension self management and patient's adherence to plan as established by provider;   Provided education to patient re: stroke prevention, s/s of heart attack and stroke; Reviewed prescribed diet DASH Reviewed medications with patient and discussed importance of compliance;  Provided assistance with obtaining home blood pressure monitor via verifying monitor is covered under patient's health plan and contacting provider to write Rx   Counseled on the importance of exercise goals with target of 150 minutes per week Discussed plans with patient for ongoing care management follow up and provided patient with direct contact information for care  management team;  Patient Goals/Self-Care Activities: Patient will self administer medications as prescribed Patient will attend all scheduled provider appointments Patient will call pharmacy for medication refills Patient will continue to perform ADL's independently Patient will continue to perform IADL's independently Patient will call provider office for new concerns or questions

## 2021-08-06 NOTE — Patient Outreach (Signed)
Medicaid Managed Care   Nurse Care Manager Note  08/05/2021 Name:  Sandra Lozano MRN:  [id number] DOB:  01/15/1961  Sandra Lozano is an 60 y.o. year old female who is a primary patient of Autry-Lott, [place], DO.  The [facility] Managed Care Coordination team was consulted for assistance with:    HTN, Prediabetes and Chronic Pain  Sandra Lozano was given information about Medicaid Managed Care Coordination team services today. Sandra Lozano Patient agreed to services and verbal consent obtained.  Engaged with patient by telephone for initial visit in response to provider referral for case management and/or care coordination services.   Assessments/Interventions:  Review of past medical history, allergies, medications, health status, including review of consultants reports, laboratory and other test data, was performed as part of comprehensive evaluation and provision of chronic care management services.  SDOH (Social Determinants of Health) assessments and interventions performed: SDOH Interventions    Flowsheet Row Most Recent Value  SDOH Interventions   Physical Activity Interventions Intervention Not Indicated  Social Connections Interventions Intervention Not Indicated  Transportation Interventions Intervention Not Indicated       Care Plan  No Known Allergies  Medications Reviewed Today     Reviewed by Barrington Ellison, RN (Registered Nurse) on 08/05/21 at 1439  Med List Status: <None>   Medication Order Taking? Sig Documenting Provider Last Dose Status Informant  atorvastatin (LIPITOR) 20 MG tablet [id number] Yes TAKE 1 TABLET BY MOUTH ONCE DAILY IN THE EVENING Autry-Lott, Simone, DO Taking Active   Blood Pressure Monitoring (BLOOD PRESSURE CUFF) MISC [id number] No Check your blood pressure in the morning when you wake up, and at night before you go to bed  Patient not taking: Reported on 08/05/2021   Shary Key, DO Not Taking Active   diclofenac Sodium (VOLTAREN) 1 % GEL  [id number] No Apply 4 g topically 3 (three) times daily as needed.  Patient not taking: Reported on 08/05/2021   Marybelle Killings, MD Not Taking Active   gabapentin (NEURONTIN) 600 MG tablet [id number] Yes Take 1 tablet by mouth twice daily Persons, Bevely Palmer, [place] Taking Active   lisinopril-hydrochlorothiazide (ZESTORETIC) 20-12.5 MG tablet [id number] Yes Take 2 tablets by mouth daily. Shary Key, DO Taking Active   meloxicam (MOBIC) 7.5 MG tablet [id number] Yes Take 1 tablet by mouth once daily Autry-Lott, Naaman Plummer, DO Taking Active   metFORMIN (GLUCOPHAGE) 500 MG tablet [id number] Yes Take 1 tablet by mouth once daily with breakfast Autry-Lott, Naaman Plummer, DO Taking Active   Omega-3 Fatty Acids (FISH OIL) 1000 MG CAPS [id number] No Take 1,000 mg by mouth at bedtime.  Patient not taking: Reported on 08/05/2021   [provider] Not Taking Active Self  oxyCODONE-acetaminophen (PERCOCET) 5-325 MG tablet [id number] Yes Take 1 tablet by mouth every 4 (four) hours as needed. Persons, Bevely Palmer, [place] Taking Active   triamcinolone ointment (KENALOG) 0.1 % [id number]  Apply 1 application topically 2 (two) times daily. Apply to scalp. Don't use more than 2 weeks in a row without physician approval. Lurline Del, DO  Active             Patient Active Problem List   Diagnosis Date Noted   Chest wall pain 04/20/2021   Hot flashes 04/20/2021   Traumatic arthritis of left foot    Skin lesion of scalp 06/04/2020   Arthritis of midfoot 03/14/2020   History of lumbar fusion 03/14/2020   Neuropathy 11/07/2019   Pre-diabetes 11/07/2019   Nummular eczema 07/06/2019  Lumbar stenosis 05/20/2019   Acute left-sided low back pain without sciatica 07/18/2018   Osteoarthritis of right hip 06/03/2018   Chronic right shoulder pain 05/05/2018   Right hip pain 05/05/2018   Rupture of anterior cruciate ligament of right knee 02/15/2018   Sprain of medial collateral ligament of right knee 02/15/2018   Essential  hypertension 02/15/2018    Conditions to be addressed/monitored per PCP order:  HTN, Prediabetes and Chronic Pain   Care Plan Patient Care Plan: RN Care Manager Plan Of Care       Problem Identified: Chronic Disease Management and Care Coordination Needs         Goal: Development of Plan Of Care For Chronic Disease Management and Care Coordination Needs To Assist With Meeting Treatment Goals    Start Date: 08/05/2021  Expected End Date: 12/03/2021  Priority: High  Note:   Current Barriers:  Chronic Disease Management support and education needs related to HTN   RNCM Clinical Goal(s):  Patient will verbalize understanding of plan for management of HTN through collaboration with RN Care manager, provider, and care team.    Interventions: Inter-disciplinary care team collaboration (see longitudinal plan of care) Evaluation of current treatment plan related to  self management and patient's adherence to plan as established by provider     Hypertension: (Status: New goal.) Last practice recorded BP readings:     BP Readings from Last 3 Encounters:  05/10/21 124/80  05/02/21 121/80  04/19/21 (!) 143/65  Most recent eGFR/CrCl:       Lab Results  Component Value Date    EGFR 100 05/10/2021    No components found for: CRCL   Evaluation of current treatment plan related to hypertension self management and patient's adherence to plan as established by provider;   Provided education to patient re: stroke prevention, s/s of heart attack and stroke; Reviewed prescribed diet DASH Reviewed medications with patient and discussed importance of compliance;  Provided assistance with obtaining home blood pressure monitor via verifying monitor is covered under patient's health plan and contacting provider to write Rx   Counseled on the importance of exercise goals with target of 150 minutes per week Discussed plans with patient for ongoing care management follow up and provided patient with  direct contact information for care management team;   Patient Goals/Self-Care Activities: Patient will self administer medications as prescribed Patient will attend all scheduled provider appointments Patient will call pharmacy for medication refills Patient will continue to perform ADL's independently Patient will continue to perform IADL's independently Patient will call provider office for new concerns or questions     Follow Up:  Patient agrees to Care Plan and Follow-up.  Plan: The Managed Medicaid care management team will reach out to the patient again over the next 30 days.  Date/time of next scheduled RN care management/care coordination outreach:  09/02/21  Kelli Churn RN, CCM, [place] Management Coordinator - Managed [place] High Risk [phone]

## 2021-08-15 DIAGNOSIS — Z419 Encounter for procedure for purposes other than remedying health state, unspecified: Secondary | ICD-10-CM | POA: Diagnosis not present

## 2021-08-26 ENCOUNTER — Ambulatory Visit (INDEPENDENT_AMBULATORY_CARE_PROVIDER_SITE_OTHER): Payer: Medicaid Other | Admitting: Physician Assistant

## 2021-08-26 ENCOUNTER — Encounter: Payer: Self-pay | Admitting: Orthopedic Surgery

## 2021-08-26 VITALS — Ht 64.0 in | Wt 206.0 lb

## 2021-08-26 DIAGNOSIS — M1711 Unilateral primary osteoarthritis, right knee: Secondary | ICD-10-CM | POA: Diagnosis not present

## 2021-08-26 MED ORDER — LIDOCAINE HCL 1 % IJ SOLN
5.0000 mL | INTRAMUSCULAR | Status: AC | PRN
  Administered 2021-08-26: 5 mL

## 2021-08-26 MED ORDER — MELOXICAM 15 MG PO TABS: 15.0000 mg | ORAL_TABLET | Freq: Every day | ORAL | 1 refills | Status: DC

## 2021-08-26 MED ORDER — METHYLPREDNISOLONE ACETATE 40 MG/ML IJ SUSP
40.0000 mg | INTRAMUSCULAR | Status: AC | PRN
  Administered 2021-08-26: 40 mg via INTRA_ARTICULAR

## 2021-08-26 NOTE — Progress Notes (Signed)
Office Visit Note   Patient: Sandra Lozano           Date of Birth: Jul 07, 1961           MRN: [id number] Visit Date: 08/26/2021              Requested by: Lavonda Jumbo, DO [address] [place],  [place] [address] PCP: Lavonda Jumbo, DO  Chief Complaint  Patient presents with   Right Knee - Pain      HPI: Patient is a pleasant 60 year old woman with a history of right knee arthritis.  She has been very helped with using Mobic as well as injections.  Last injection was 9 months ago.  She is also wondering if she can increase her Mobic to 15 mg  Assessment & Plan: Visit Diagnoses: No diagnosis found.  Plan: Patient may follow-up as needed I reviewed her most recent BMET and I have increased her meloxicam to 15  Follow-Up Instructions: No follow-ups on file.   Ortho Exam  Patient is alert, oriented, no adenopathy, well-dressed, normal affect, normal respiratory effort. Examination she has no effusion.  No cellulitis she is tender over the medial joint line she has crepitus with range of motion no signs of infection  Imaging: No results found. No images are attached to the encounter.  Labs: Lab Results  Component Value Date   HGBA1C 6.1 08/22/2020   HGBA1C 6.3 11/01/2019     Lab Results  Component Value Date   ALBUMIN 4.0 05/18/2019    No results found for: MG No results found for: VD25OH  No results found for: PREALBUMIN CBC EXTENDED Latest Ref Rng & Units 11/02/2020 05/21/2019 05/18/2019  WBC 4.0 - 10.5 K/uL - 11.4(H) 10.5  RBC 3.87 - 5.11 MIL/uL - 3.35(L) 4.78  HGB 12.0 - 15.0 g/dL 02.5 8.5(I) 77.8  HCT 24.2 - 46.0 % 43.0 28.1(L) 41.2  PLT 150 - 400 K/uL - 284 356     Body mass index is 35.36 kg/m.  Orders:  No orders of the defined types were placed in this encounter.  Meds ordered this encounter  Medications   meloxicam (MOBIC) 15 MG tablet    Sig: Take 1 tablet (15 mg total) by mouth daily.    Dispense:  30 tablet    Refill:  1      Procedures: Large Joint Inj on 08/26/2021 3:17 PM Indications: pain and diagnostic evaluation Details: 22 G 1.5 in needle, anteromedial approach  Arthrogram: No  Medications: 40 mg methylPREDNISolone acetate 40 MG/ML; 5 mL lidocaine 1 % Outcome: tolerated well, no immediate complications Procedure, treatment alternatives, risks and benefits explained, specific risks discussed. Consent was given by the patient.     Clinical Data: No additional findings.  ROS:  All other systems negative, except as noted in the HPI. Review of Systems  Objective: Vital Signs: Ht 5\' 4"  (1.626 m)   Wt 206 lb (93.4 kg)   LMP  (LMP Unknown)   BMI 35.36 kg/m   Specialty Comments:  No specialty comments available.  PMFS History: Patient Active Problem List   Diagnosis Date Noted   Chest wall pain 04/20/2021   Hot flashes 04/20/2021   Traumatic arthritis of left foot    Skin lesion of scalp 06/04/2020   Arthritis of midfoot 03/14/2020   History of lumbar fusion 03/14/2020   Neuropathy 11/07/2019   Pre-diabetes 11/07/2019   Nummular eczema 07/06/2019   Lumbar stenosis 05/20/2019   Acute left-sided low back pain  without sciatica 07/18/2018   Osteoarthritis of right hip 06/03/2018   Chronic right shoulder pain 05/05/2018   Right hip pain 05/05/2018   Rupture of anterior cruciate ligament of right knee 02/15/2018   Sprain of medial collateral ligament of right knee 02/15/2018   Essential hypertension 02/15/2018   Past Medical History:  Diagnosis Date   Arthritis    right knee, lower back   Dyspnea    very rare -tx with albuterol neb sol if needed   Hyperlipidemia    Hypertension    Pre-diabetes    per patient - does not check blood sugar   SVD (spontaneous vaginal delivery)    x 5 - only 2 living, 1 stillborn and 1 premature at 7 months demise,1 child deceased   Wears dentures    upper and lower    No family history on file.  Past Surgical History:  Procedure Laterality  Date   FOOT ARTHRODESIS Left 11/02/2020   Procedure: LEFT MIDFOOT FUSION BASE 1ST AND 2ND METATARSAL;  Surgeon: Nadara Mustard, MD;  Location: [facility] OR;  Service: Orthopedics;  Laterality: Left;   LUMBAR FUSION  05/20/2019   GILL PROCEDURE, LEFT TRANSFORAMINAL LUMBAR INTERBODY FUSION, PEDICLE INSTRUMENTATION, BILATERAL FUSION (N/A   TUBAL LIGATION     Social History   Occupational History   Not on file  Tobacco Use   Smoking status: Former    Packs/day: 1.00    Years: 30.00    Pack years: 30.00    Types: Cigarettes    Quit date: 01/2015    Years since quitting: 6.6   Smokeless tobacco: Never  Vaping Use   Vaping Use: Never used  Substance and Sexual Activity   Alcohol use: Not Currently   Drug use: Yes    Types: Marijuana    Comment: occassionally   Sexual activity: Not on file

## 2021-09-02 ENCOUNTER — Other Ambulatory Visit: Payer: Self-pay | Admitting: *Deleted

## 2021-09-02 ENCOUNTER — Ambulatory Visit: Payer: Self-pay

## 2021-09-02 NOTE — Patient Outreach (Signed)
Care Coordination  09/02/2021  Veretta Sabourin 1961/05/29 [id number]  Successful outreach to patient for follow up telephone assessment, call was placed at 2:22 pm for 2:00 pm appointment.  Patient states she works on Monday and Wednesday afternoons and prefers RNCM reschedule today's call. After discussing with patient, message sent to Weston Settle, scheduling care guide, requesting follow up telephone appointment at 1:00 pm on 09/13/21.  Cranford Mon RN, CCM, CDCES [place]  [facility] Care Management Coordinator - Managed [place] [facility] [phone]

## 2021-09-04 ENCOUNTER — Other Ambulatory Visit: Payer: Self-pay | Admitting: Physician Assistant

## 2021-09-04 ENCOUNTER — Other Ambulatory Visit: Payer: Self-pay | Admitting: Family Medicine

## 2021-09-04 DIAGNOSIS — G629 Polyneuropathy, unspecified: Secondary | ICD-10-CM

## 2021-09-04 DIAGNOSIS — I10 Essential (primary) hypertension: Secondary | ICD-10-CM

## 2021-09-04 DIAGNOSIS — R7303 Prediabetes: Secondary | ICD-10-CM

## 2021-09-04 MED ORDER — ACCU-CHEK SOFTCLIX LANCETS MISC: 12 refills | Status: DC

## 2021-09-04 MED ORDER — GLUCOSE BLOOD VI STRP: ORAL_STRIP | 12 refills | Status: DC

## 2021-09-04 MED ORDER — BLOOD GLUCOSE MONITORING SUPPL W/DEVICE KIT: 1.0000 | PACK | Freq: Every day | 0 refills | Status: DC

## 2021-09-04 MED ORDER — BLOOD PRESSURE CUFF MISC: 0 refills | Status: DC

## 2021-09-04 NOTE — Progress Notes (Signed)
Orders for BP monitoring and glucose monitoring placed during this encounter.   Sandra Flight Autry-Lott, DO 09/04/2021, 8:35 PM PGY-3, [facility]

## 2021-09-11 ENCOUNTER — Telehealth: Payer: Self-pay | Admitting: *Deleted

## 2021-09-11 NOTE — Patient Outreach (Signed)
Care Coordination  09/11/2021  Sandra Lozano [birth year] [id number]  Messaged patient via My chart advising her a Rx for a glucometer and home BP monitor were faxed to [facility] by her primary care provider and requesting she call the pharmacy to arrange free home delivery of the items so that she may begin self monitoring.   Cranford Mon RN, CCM, CDCES [name]  [facility] Care Management Coordinator - Managed [place] [facility] [phone]

## 2021-09-13 ENCOUNTER — Ambulatory Visit: Payer: Self-pay

## 2021-09-14 DIAGNOSIS — Z419 Encounter for procedure for purposes other than remedying health state, unspecified: Secondary | ICD-10-CM | POA: Diagnosis not present

## 2021-09-23 ENCOUNTER — Other Ambulatory Visit: Payer: Self-pay | Admitting: Family Medicine

## 2021-10-09 ENCOUNTER — Other Ambulatory Visit: Payer: Self-pay

## 2021-10-09 DIAGNOSIS — G629 Polyneuropathy, unspecified: Secondary | ICD-10-CM

## 2021-10-11 MED ORDER — GABAPENTIN 600 MG PO TABS: 600.0000 mg | ORAL_TABLET | Freq: Two times a day (BID) | ORAL | 0 refills | Status: DC

## 2021-10-15 DIAGNOSIS — Z419 Encounter for procedure for purposes other than remedying health state, unspecified: Secondary | ICD-10-CM | POA: Diagnosis not present

## 2021-10-29 NOTE — Patient Instructions (Addendum)
It was nice seeing you today!   Get your X-rays done before you go to your orthopedic doctor. You do not need an appointment for X-rays.  [facility] Address: [address], [place], [place] [address] Phone: [phone]   Try doing shoulder exercises.  Labs today. I will notify you of results on MyChart.  Stay well, Littie Deeds, MD [facility] [phone]

## 2021-10-29 NOTE — Progress Notes (Signed)
    SUBJECTIVE:   CHIEF COMPLAINT / HPI: finger pain  Left index finger pain for 6 months, getting worse. She has also noticed some ulnar deviation in the finger. On meloxicam 15 mg daily for OA in her right knee. Also taking OTC acetaminophen as needed. The meloxicam has not been helping much with the finger. Denies fever, chills, redness or warmth to the finger joint.  She has also had right shoulder pain for several months which she believes is from arthritis. Feels the pain inside the joint. She states she has never had imaging of her shoulder. She has been going to orthopedics for her knee arthritis.  PERTINENT  PMH / PSH: OA  OBJECTIVE:   BP 131/73   Pulse 63   Ht 5\' 4"  (1.626 m)   Wt 210 lb (95.3 kg)   LMP  (LMP Unknown)   SpO2 100%   BMI 36.05 kg/m   General: obese, NAD MSK: Ulnar deviation of the left 2nd digit. Heberden's nodes appreciated on left thumb and 2nd digit as well as right thumb. No erythema or warmth. Mildly tender along lateral DIP of left 2nd digit. Full strength with flexion of DIP and PIP joints of left hand. Right shoulder with no obvious deformity. No tenderness to palpation. She has pain with resisted external rotation. Negative painful arc.  ASSESSMENT/PLAN:   Left finger pain Suspect underlying OA as the cause given history. She has ulnar deviation of the finger which can be a result of OA. Low suspicion for inflammatory/autoimmune process. Already on long-acting NSAID. No prior imaging. - XR left hand - BMP given NSAID use - advised ortho f/u  Right shoulder pain Suspect underlying OA. Low suspicion for impingement or rotator cuff tear. No prior imaging. - XR right shoulder - advised ortho f/u     , MD [facility]

## 2021-10-30 ENCOUNTER — Ambulatory Visit (INDEPENDENT_AMBULATORY_CARE_PROVIDER_SITE_OTHER): Payer: Medicaid Other | Admitting: Family Medicine

## 2021-10-30 ENCOUNTER — Other Ambulatory Visit: Payer: Self-pay

## 2021-10-30 VITALS — BP 131/73 | HR 63 | Ht 64.0 in | Wt 210.0 lb

## 2021-10-30 DIAGNOSIS — M25511 Pain in right shoulder: Secondary | ICD-10-CM

## 2021-10-30 DIAGNOSIS — Z791 Long term (current) use of non-steroidal anti-inflammatories (NSAID): Secondary | ICD-10-CM

## 2021-10-30 DIAGNOSIS — M79645 Pain in left finger(s): Secondary | ICD-10-CM

## 2021-10-30 DIAGNOSIS — G8929 Other chronic pain: Secondary | ICD-10-CM

## 2021-10-31 LAB — BASIC METABOLIC PANEL
BUN/Creatinine Ratio: 17 (ref 12–28)
BUN: 12 mg/dL (ref 8–27)
CO2: 26 mmol/L (ref 20–29)
Calcium: 10.2 mg/dL (ref 8.7–10.3)
Chloride: 100 mmol/L (ref 96–106)
Creatinine, Ser: 0.69 mg/dL (ref 0.57–1.00)
Glucose: 87 mg/dL (ref 70–99)
Potassium: 5.1 mmol/L (ref 3.5–5.2)
Sodium: 142 mmol/L (ref 134–144)
eGFR: 99 mL/min/{1.73_m2} (ref >59)

## 2021-11-01 ENCOUNTER — Encounter: Payer: Self-pay | Admitting: Family Medicine

## 2021-11-12 ENCOUNTER — Other Ambulatory Visit: Payer: Self-pay

## 2021-11-12 DIAGNOSIS — R7303 Prediabetes: Secondary | ICD-10-CM

## 2021-11-14 DIAGNOSIS — Z419 Encounter for procedure for purposes other than remedying health state, unspecified: Secondary | ICD-10-CM | POA: Diagnosis not present

## 2021-11-14 MED ORDER — GLUCOSE BLOOD VI STRP: ORAL_STRIP | 12 refills | Status: DC

## 2021-11-14 MED ORDER — MELOXICAM 7.5 MG PO TABS: 7.5000 mg | ORAL_TABLET | Freq: Every day | ORAL | 0 refills | Status: DC

## 2021-11-26 ENCOUNTER — Encounter: Payer: Self-pay | Admitting: Orthopaedic Surgery

## 2021-11-26 ENCOUNTER — Ambulatory Visit (INDEPENDENT_AMBULATORY_CARE_PROVIDER_SITE_OTHER): Payer: Medicaid Other | Admitting: Orthopaedic Surgery

## 2021-11-26 ENCOUNTER — Ambulatory Visit (INDEPENDENT_AMBULATORY_CARE_PROVIDER_SITE_OTHER): Payer: Medicaid Other

## 2021-11-26 VITALS — BP 162/85 | HR 64 | Ht 64.0 in | Wt 209.0 lb

## 2021-11-26 DIAGNOSIS — M79642 Pain in left hand: Secondary | ICD-10-CM

## 2021-11-26 DIAGNOSIS — M19042 Primary osteoarthritis, left hand: Secondary | ICD-10-CM

## 2021-11-26 NOTE — Progress Notes (Signed)
Office Visit Note   Patient: Sandra Lozano           Date of Birth: 1961/06/15           MRN: [id number] Visit Date: 11/26/2021              Requested by: [name], [place],  [place] [address] PCP: [name], DO   Assessment & Plan: Visit Diagnoses:  1. Pain in left hand   2. Arthritis of finger of left hand     Plan: We will obtain arthritis panel.  I will call her with the results.  If labs are abnormal she may require rheumatologic consultation.  She will continue meloxicam and talk with her PCP about dosage.  Follow-Up Instructions: No follow-ups on file.   Orders:  Orders Placed This Encounter  Procedures   XR Hand Complete Left   Sed Rate (ESR)   Uric acid   Rheumatoid Factor   Antinuclear Antib (ANA)   No orders of the defined types were placed in this encounter.     Procedures: No procedures performed   Clinical Data: No additional findings.   Subjective: Chief Complaint  Patient presents with   Left Hand - Pain    HPI 60 year old female seen with pain in her left index finger and long finger.  She noticed some ulnar drift at the DIP joint worse in the index finger with aching pains.  She had been on meloxicam 15 mg she states somehow the last prescription sent and was only 7.5 mg and she is going to call and talk with her PCP about this. She has not had rheumatologic labs.  She states when she was on the 50 mg she had minimal symptoms but on the 7.5 her fingers ,hands, knees are hurting worse. Review of Systems all the systems update unchanged.   Objective: Vital Signs: BP (!) 162/85    Pulse 64    Ht 5' 4"  (1.626 m)    Wt 209 lb (94.8 kg)    LMP  (LMP Unknown)    BMI 35.87 kg/m   Physical Exam Constitutional:      Appearance: She is well-developed.  HENT:     Head: Normocephalic.     Right Ear: External ear normal.     Left Ear: External ear normal. There is no impacted cerumen.  Eyes:     Pupils: Pupils  are equal, round, and reactive to light.  Neck:     Thyroid: No thyromegaly.     Trachea: No tracheal deviation.  Cardiovascular:     Rate and Rhythm: Normal rate.  Pulmonary:     Effort: Pulmonary effort is normal.  Abdominal:     Palpations: Abdomen is soft.  Musculoskeletal:     Cervical back: No rigidity.  Skin:    General: Skin is warm and dry.  Neurological:     Mental Status: She is alert and oriented to person, place, and time.  Psychiatric:        Behavior: Behavior normal.    Ortho Exam patient is able to make a fist.  She has some ulnar angulation DIP joint index more than long finger.  Carpal tunnel exam is negative good cervical range of motion.  Elbows reach full extension.  No dorsal tenosynovitis either wrist.  Pulses are normal.  Specialty Comments:  No specialty comments available.  Imaging: XR Hand Complete Left  Result Date: 11/27/2021 Three-view x-rays left hand obtained and reviewed.  This shows some ulnar angulation DIP joint index finger more than the long finger with some marginal erosive changes DIP joint more index and long.  Joint space narrowing of the IP joint small finger as well.  Dorsal spurring noted DIP joints index and long finger more consistent with osteoarthritis. Impression: Arthritic changes primarily DIP joint with some components of osteoarthritis but some marginal erosion.  More suggestive of inflammatory arthritis.    PMFS History: Patient Active Problem List   Diagnosis Date Noted   Arthritis of finger of left hand 11/26/2021   Chest wall pain 04/20/2021   Hot flashes 04/20/2021   Traumatic arthritis of left foot    Skin lesion of scalp 06/04/2020   Arthritis of midfoot 03/14/2020   History of lumbar fusion 03/14/2020   Neuropathy 11/07/2019   Pre-diabetes 11/07/2019   Nummular eczema 07/06/2019   Lumbar stenosis 05/20/2019   Acute left-sided low back pain without sciatica 07/18/2018   Osteoarthritis of right hip 06/03/2018    Chronic right shoulder pain 05/05/2018   Right hip pain 05/05/2018   Rupture of anterior cruciate ligament of right knee 02/15/2018   Sprain of medial collateral ligament of right knee 02/15/2018   Essential hypertension 02/15/2018   Past Medical History:  Diagnosis Date   Arthritis    right knee, lower back   Dyspnea    very rare -tx with albuterol neb sol if needed   Hyperlipidemia    Hypertension    Pre-diabetes    per patient - does not check blood sugar   SVD (spontaneous vaginal delivery)    x 5 - only 2 living, 1 stillborn and 1 premature at 7 months demise,1 child deceased   Wears dentures    upper and lower    No family history on file.  Past Surgical History:  Procedure Laterality Date   FOOT ARTHRODESIS Left 11/02/2020   Procedure: LEFT MIDFOOT FUSION BASE 1ST AND 2ND METATARSAL;  Surgeon: Newt Minion, MD;  Location: [facility];  Service: Orthopedics;  Laterality: Left;   LUMBAR FUSION  05/20/2019   GILL PROCEDURE, LEFT TRANSFORAMINAL LUMBAR INTERBODY FUSION, PEDICLE INSTRUMENTATION, BILATERAL FUSION (N/A   TUBAL LIGATION     Social History   Occupational History   Not on file  Tobacco Use   Smoking status: Former    Packs/day: 1.00    Years: 30.00    Pack years: 30.00    Types: Cigarettes    Quit date: 01/2015    Years since quitting: 6.8   Smokeless tobacco: Never  Vaping Use   Vaping Use: Never used  Substance and Sexual Activity   Alcohol use: Not Currently   Drug use: Yes    Types: Marijuana    Comment: occassionally   Sexual activity: Not on file

## 2021-11-27 ENCOUNTER — Other Ambulatory Visit: Payer: Self-pay

## 2021-11-27 NOTE — Telephone Encounter (Signed)
Patient calls nurse line regarding refill request on meloxicam. Patient reports that she has been on 15 mg daily, however, rx is still written for 7.5 mg.   Please update rx if appropriate.  Veronda Prude, RN

## 2021-11-29 MED ORDER — MELOXICAM 7.5 MG PO TABS: 7.5000 mg | ORAL_TABLET | Freq: Every day | ORAL | 0 refills | Status: DC

## 2021-12-01 LAB — ANTI-NUCLEAR AB-TITER (ANA TITER): ANA Titer 1: 1:80 {titer} — ABNORMAL HIGH

## 2021-12-01 LAB — ANA: Anti Nuclear Antibody (ANA): POSITIVE — AB

## 2021-12-01 LAB — SEDIMENTATION RATE: Sed Rate: 11 mm/h (ref 0–30)

## 2021-12-01 LAB — RHEUMATOID FACTOR: Rheumatoid fact SerPl-aCnc: <14 IU/mL (ref <14)

## 2021-12-01 LAB — URIC ACID: Uric Acid, Serum: 5.2 mg/dL (ref 2.5–7.0)

## 2021-12-03 NOTE — Addendum Note (Signed)
Addended by: Rogers Seeds on: 12/03/2021 08:46 AM   Modules accepted: Orders

## 2021-12-05 ENCOUNTER — Other Ambulatory Visit: Payer: Self-pay

## 2021-12-05 DIAGNOSIS — G629 Polyneuropathy, unspecified: Secondary | ICD-10-CM

## 2021-12-05 DIAGNOSIS — I10 Essential (primary) hypertension: Secondary | ICD-10-CM

## 2021-12-05 MED ORDER — LISINOPRIL-HYDROCHLOROTHIAZIDE 20-12.5 MG PO TABS: 2.0000 | ORAL_TABLET | Freq: Every day | ORAL | 3 refills | Status: DC

## 2021-12-05 MED ORDER — MELOXICAM 15 MG PO TABS: 15.0000 mg | ORAL_TABLET | Freq: Every day | ORAL | 0 refills | Status: DC

## 2021-12-05 MED ORDER — GABAPENTIN 600 MG PO TABS
600.0000 mg | ORAL_TABLET | Freq: Two times a day (BID) | ORAL | 0 refills | Status: DC
Start: 2021-12-05 — End: 2022-01-06

## 2021-12-05 NOTE — Telephone Encounter (Signed)
Rx to pharmacy of 15 mg.   Ashwath Lasch Autry-Lott, DO 12/05/2021, 7:31 PM PGY-3,  Family Medicine

## 2021-12-05 NOTE — Telephone Encounter (Signed)
Patient is calls nurse line requesting rx refills on the pended medications.   Patient is also asking about the increased in meloxicam dosage from 7.5 mg to 15 mg.   Please advise.   Veronda Prude, RN

## 2021-12-15 DIAGNOSIS — Z419 Encounter for procedure for purposes other than remedying health state, unspecified: Secondary | ICD-10-CM | POA: Diagnosis not present

## 2021-12-18 ENCOUNTER — Ambulatory Visit: Payer: Medicaid Other

## 2021-12-20 ENCOUNTER — Encounter: Payer: Self-pay | Admitting: Family Medicine

## 2021-12-20 ENCOUNTER — Ambulatory Visit (INDEPENDENT_AMBULATORY_CARE_PROVIDER_SITE_OTHER): Payer: Medicaid Other | Admitting: Family Medicine

## 2021-12-20 ENCOUNTER — Other Ambulatory Visit: Payer: Self-pay

## 2021-12-20 VITALS — BP 126/80 | HR 76 | Wt 208.2 lb

## 2021-12-20 DIAGNOSIS — B351 Tinea unguium: Secondary | ICD-10-CM | POA: Diagnosis not present

## 2021-12-20 DIAGNOSIS — G5603 Carpal tunnel syndrome, bilateral upper limbs: Secondary | ICD-10-CM | POA: Diagnosis not present

## 2021-12-20 DIAGNOSIS — L600 Ingrowing nail: Secondary | ICD-10-CM

## 2021-12-20 NOTE — Patient Instructions (Addendum)
It was nice seeing you today!  Look for a cock up wrist splint or a wrist splint specific for carpal tunnel at the pharmacy. Wear this at night.  Referral to podiatrist. They will be giving you a call for an appointment. Let us know if you don't hear back in the next 2 weeks.  Please arrive at least 15 minutes prior to your scheduled appointments.  Stay well, Zola Button, MD [place] [phone]

## 2021-12-20 NOTE — Progress Notes (Signed)
° ° °  SUBJECTIVE:   CHIEF COMPLAINT / HPI: Toenail fungus, numbness/tingling in the hands  Patient requests referral for podiatrist for persistent issues with toenail fungus and toe pain worse in the left foot.  She has had to trim her toenails really short to alleviate pain.  She has previously tried oral and topical medications for toenail fungus without relief.  She has had issues with numbness and tingling in her bilateral hands.  She will bring her hands which improve her symptoms.  She does state that she was treated for carpal tunnel many years ago.   PERTINENT  PMH / PSH: HTN, prediabetes, left foot surgery  OBJECTIVE:   BP 126/80    Pulse 76    Wt 208 lb 3.2 oz (94.4 kg)    LMP  (LMP Unknown)    SpO2 99%    BMI 35.74 kg/m   General: Alert middle-aged female, NAD CV: Normal rate, 2+ PT and DP pulses Pulm: Nonlabored respirations Extremities: Onychomycosis and ingrown toenails noted MSK: Positive Phalen's test     ASSESSMENT/PLAN:   Bilateral carpal tunnel syndrome History and exam consistent with carpal tunnel syndrome.  Positive flick sign.  Treat with wrist splints at night, plan for follow-up in 6 weeks.   Onychomycosis Ingrown toenails Refer to podiatry per patient request.   Littie Deeds, MD [facility]

## 2021-12-21 DIAGNOSIS — G5603 Carpal tunnel syndrome, bilateral upper limbs: Secondary | ICD-10-CM | POA: Insufficient documentation

## 2021-12-21 NOTE — Assessment & Plan Note (Signed)
History and exam consistent with carpal tunnel syndrome.  Positive flick sign.  Treat with wrist splints at night, plan for follow-up in 6 weeks.

## 2021-12-30 ENCOUNTER — Other Ambulatory Visit: Payer: Self-pay

## 2021-12-30 ENCOUNTER — Ambulatory Visit (INDEPENDENT_AMBULATORY_CARE_PROVIDER_SITE_OTHER): Payer: Medicaid Other | Admitting: Podiatry

## 2021-12-30 DIAGNOSIS — M79675 Pain in left toe(s): Secondary | ICD-10-CM

## 2021-12-30 DIAGNOSIS — B351 Tinea unguium: Secondary | ICD-10-CM | POA: Diagnosis not present

## 2021-12-30 DIAGNOSIS — M79674 Pain in right toe(s): Secondary | ICD-10-CM

## 2021-12-30 NOTE — Progress Notes (Signed)
° °  SUBJECTIVE Patient presents to office today complaining of elongated, thickened nails that cause pain while ambulating in shoes.  Patient is unable to trim their own nails. Patient is here for further evaluation and treatment.  Past Medical History:  Diagnosis Date   Arthritis    right knee, lower back   Dyspnea    very rare -tx with albuterol neb sol if needed   Hyperlipidemia    Hypertension    Pre-diabetes    per patient - does not check blood sugar   SVD (spontaneous vaginal delivery)    x 5 - only 2 living, 1 stillborn and 1 premature at 7 months demise,1 child deceased   Wears dentures    upper and lower    OBJECTIVE General Patient is awake, alert, and oriented x 3 and in no acute distress. Derm Skin is dry and supple bilateral. Negative open lesions or macerations. Remaining integument unremarkable. Nails are tender, long, thickened and dystrophic with subungual debris, consistent with onychomycosis, 1-5 bilateral. No signs of infection noted. Vasc  DP and PT pedal pulses palpable bilaterally. Temperature gradient within normal limits.  Neuro Epicritic and protective threshold sensation grossly intact bilaterally.  Musculoskeletal Exam No symptomatic pedal deformities noted bilateral. Muscular strength within normal limits.  ASSESSMENT 1.  Pain due to onychomycosis of toenails both  PLAN OF CARE 1. Patient evaluated today.  2. Instructed to maintain good pedal hygiene and foot care.  3. Mechanical debridement of nails 1-5 bilaterally performed using a nail nipper. Filed with dremel without incident.  4. Return to clinic in 3 mos.    Edrick Kins, DPM [facility]  Dr. Edrick Kins, DPM    [address], [place] [address]                Office [phone]  Fax [phone]

## 2022-01-01 ENCOUNTER — Encounter: Payer: Self-pay | Admitting: Family Medicine

## 2022-01-04 ENCOUNTER — Other Ambulatory Visit: Payer: Self-pay | Admitting: Family Medicine

## 2022-01-04 DIAGNOSIS — G629 Polyneuropathy, unspecified: Secondary | ICD-10-CM

## 2022-01-06 MED ORDER — ATORVASTATIN CALCIUM 20 MG PO TABS: 20.0000 mg | ORAL_TABLET | Freq: Every evening | ORAL | 0 refills | Status: DC

## 2022-01-06 MED ORDER — TRIAMCINOLONE ACETONIDE 0.1 % EX OINT: 1.0000 "application " | TOPICAL_OINTMENT | Freq: Two times a day (BID) | CUTANEOUS | 0 refills | Status: DC

## 2022-01-06 MED ORDER — METFORMIN HCL 500 MG PO TABS: 500.0000 mg | ORAL_TABLET | Freq: Every day | ORAL | 0 refills | Status: DC

## 2022-01-07 ENCOUNTER — Other Ambulatory Visit: Payer: Self-pay

## 2022-01-07 DIAGNOSIS — R7303 Prediabetes: Secondary | ICD-10-CM

## 2022-01-07 MED ORDER — GLUCOSE BLOOD VI STRP: ORAL_STRIP | 6 refills | Status: DC

## 2022-01-07 NOTE — Telephone Encounter (Signed)
Patient calls nurse line reporting she needs an updated lancet prescription sent to her new pharmacy.   This has been done.

## 2022-01-07 NOTE — Addendum Note (Signed)
Addended by: Steva Colder on: 01/07/2022 11:28 AM   Modules accepted: Orders

## 2022-01-15 DIAGNOSIS — Z419 Encounter for procedure for purposes other than remedying health state, unspecified: Secondary | ICD-10-CM | POA: Diagnosis not present

## 2022-01-27 ENCOUNTER — Other Ambulatory Visit: Payer: Self-pay | Admitting: *Deleted

## 2022-01-27 NOTE — Patient Instructions (Signed)
Visit Information  Ms. Filter was given information about Medicaid Managed Care team care coordination services as a part of their [facility] Medicaid benefit. Kairie Vangieson verbally consented to engagement with the [facility] Managed Care team.   If you are experiencing a medical emergency, please call 911 or report to your local emergency department or urgent care.   If you have a non-emergency medical problem during routine business hours, please contact your provider's office and ask to speak with a nurse.   For questions related to your [employer] health plan, please call: [phone] or go here:[email or web address]  If you would like to schedule transportation through your [facility] plan, please call the following number at least 2 days in advance of your appointment: [phone].  Call the [place] at [phone], at any time, 24 hours a day, 7 days a week. If you are in danger or need immediate medical attention call 911.  If you would like help to quit smoking, call 1-800-QUIT-NOW 480-614-7650) OR Espaol: 1-855-Djelo-Ya (6-606-301-6010) o para ms informacin haga clic aqu or Text READY to 200-400 to register via text  Ms. Barradas - following are the goals we discussed in your visit today:   Goals Addressed  Patient will self administer medications as prescribed Patient will attend all scheduled provider appointments Patient will call pharmacy for medication refills Patient will continue to perform ADL's independently Patient will continue to perform IADL's independently Patient will call provider office for new concerns or questions             This Visit's Progress    COMPLETED: Cope with Chronic Pain       Timeframe:  Long-Range Goal Priority:  High Start Date:  08/05/21                           Expected End Date:    ongoing                   Follow Up Date 09/02/21 - patient lost to follow up ;  reestablished with HR MM  program on 01/27/22 - practice acceptance of chronic pain - use distraction techniques- - continue my daily exercise routine   Why is this important?   Stress makes chronic pain feel worse.  Feelings like depression, anxiety, stress and anger can make your body more sensitive to pain.  Learning ways to cope with stress or depression may help you find some relief from the pain.     Notes: resolved due to duplicate goal on 9/32/35- see [place]: Learn More About My Health       Timeframe:  Long-Range Goal Priority:  High Start Date:  08/05/21                           Expected End Date:      ongoing                  Follow Up Date 09/02/21 - patient lost to follow up; reestablished with HR MM program on 01/27/22   - make a list of questions - ask questions -review educational material mailed to me on managing HTN    Why is this important?   The best way to learn about your health and care is by talking to the doctor and nurse.  They will answer your questions and give you information  in the way that you like best.    Notes: resolved due to duplicate goal on 2/62/03- see [place]: Track and Manage My Blood Pressure-Hypertension       Timeframe:  Long-Range Goal Priority:  High Start Date:     08/05/21                        Expected End Date:   onging                    Follow Up Date 09/02/21 - patient lost to follow up; established with HR MM Program on 01/27/22   - check blood pressure weekly - write blood pressure results in a log or diary - use [place] [employer] spiral bound day planner to record home BP readings   Why is this important?   You won't feel high blood pressure, but it can still hurt your blood vessels.  High blood pressure can cause heart or kidney problems. It can also cause a stroke.  Making lifestyle changes like losing a little weight or eating less salt will help.  Checking your blood pressure  at home and at different times of the day can help to control blood pressure.  If the doctor prescribes medicine remember to take it the way the doctor ordered.  Call the office if you cannot afford the medicine or if there are questions about it.     Notes: 01/27/22- resolved due to dulplicate goal- see RNCM care plan        Patient verbalizes understanding of instructions and care plan provided today and agrees to view in [place]. Active MyChart status confirmed with patient.    The Managed Medicaid care management team will reach out to the patient again over the next 30 days.   Kelli Churn RN, CCM, [place] Management Coordinator - Managed [place] High Risk [phone]   Following is a copy of your plan of care:  Care Plan : [place]  Updates made by Barrington Ellison, RN since 01/27/2022 12:00 AM     Problem: Chronic Disease Management and Care Coordination Needs realted to HTN, prediabetes, and chronic pain   Priority: High     Goal: Development of Plan Of Care For Chronic Disease Management and Care Coordination Needs To Assist With Meeting Treatment Goals for HTN, prediabetes and chronic pain   Start Date: 08/05/2021  Expected End Date: 08/05/2022  Priority: High  Note:   Current Barriers:  Chronic Disease Management support and education needs related to HTN, prediabetes and chronic pain- patient was lost to follow up after an initial evaluation in August 2022; successful outreach to patient for follow up assessment on 01/27/22 with apologies for lost to follow up . Patient is requesting assistance from this RNCM with:  perimenopausal hot flash treatments-she says her sister died of breat cancer so she is seeking non hormonal treatment ; securing nebulizer machine to treat her intermittent but chronic dyspnea - she says she has the medications and is currently using her friend's nebulizer machine, and suggestions to  treat right carpal tunnel hand and wrist pain  RNCM Clinical Goal(s):  Patient will verbalize understanding of plan for management of HTN , prediabetes and chronic pain through collaboration with RN Care manager, provider, and care team.   Interventions: Inter-disciplinary care team collaboration (see longitudinal  plan of care) Evaluation of current treatment plan related to  self management and patient's adherence to plan as established by provider Discussed nonpharmacologic and pharmacologic treatment for hot flashes Discussed requesting PCP increase gabapentin dosage at hs to treat carpal tunnel pain in right hand and wrist Sent message to patient's primary care provider requesting: order for home nebulizer, non hormonal treatment of hot flashes( off label use of Clonidine and/or Paxil or other SSRI) , and request increase in gabapentin at hs   Hypertension: (Status: Goal on Track (progressing): YES.)Long Term- per patient >75% of BP readings at home and at provider's office are meeting treatment target of <140/<90 Last practice recorded BP readings:  BP Readings from Last 3 Encounters:  12/20/21 126/80  11/26/21 (!) 162/85  10/30/21 131/73    Most recent eGFR/CrCl:  Lab Results  Component Value Date   EGFR 100 05/10/2021    No components found for: CRCL  Evaluation of current treatment plan related to hypertension self management and patient's adherence to plan as established by provider;   Reviewed medications with patient and discussed importance of compliance;  Ensured patient had home BP monitor  Assessed frequency of home BP monitoring and variance of home BP readings Assessed BP readings in provider's office via EMR review Discussed plans with patient for ongoing care management follow up and provided patient with direct contact information for care management team;   Pre- Diabetes Interventions:  (Status:  New goal.) Long Term Goal Assessed patient's understanding of A1c  goal:  <6.4% Reviewed medications with patient and discussed importance of medication adherence Counseled on importance of regular laboratory monitoring as prescribed Discussed plans with patient for ongoing care management follow up and provided patient with direct contact information for care management team Advised patient, providing education and rationale, to check cbg (every morning -fasting)  and record, calling PCP for findings outside established parameters Review of patient status, including review of consultants reports, relevant laboratory and other test results, and medications completed Lab Results  Component Value Date   HGBA1C 6.1 08/22/2020     Pain Interventions:  (Status:  New goal.) Long Term Goal- patient states her primary pain issues today are carpal tunnel syndrome pain in right hand and wrist; states she is adherent with wearing right wrist brace and taking Mobic and gabapentin as prescribed Medications reviewed Reviewed provider established plan for pain management Discussed importance of adherence to all scheduled medical appointments Counseled on the importance of reporting any/all new or changed pain symptoms or management strategies to pain management provider Advised patient to report to care team affect of pain on daily activities Advised patient this RNCM will message provider about   increasing gabapentin dose at hs to treat right wrist and hand pain  Patient Goals/Self-Care Activities: Patient will self administer medications as prescribed Patient will attend all scheduled provider appointments Patient will call pharmacy for medication refills Patient will continue to perform ADL's independently Patient will continue to perform IADL's independently Patient will call provider office for new concerns or questions

## 2022-01-27 NOTE — Patient Outreach (Signed)
Medicaid Managed Care   Nurse Care Manager Note  01/27/2022 Name:  Sandra Lozano MRN:  [id number] DOB:  01/18/1961  Sandra Lozano is an 61 y.o. year old female who is a primary patient of Sandra Lozano, [place], DO.  The [facility] Managed Care Coordination team was consulted for assistance with:    HTN Prediabetes Chronic Pain  Sandra Lozano was given information about Medicaid Managed Care Coordination team services today. Sandra Lozano Patient agreed to services and verbal consent obtained.  Engaged with patient by telephone for follow up visit in response to provider referral for case management and/or care coordination services.   Assessments/Interventions:  Review of past medical history, allergies, medications, health status, including review of consultants reports, laboratory and other test data, was performed as part of comprehensive evaluation and provision of chronic care management services.  SDOH (Social Determinants of Health) assessments and interventions performed:   Care Plan  No Known Allergies  Medications Reviewed Today     Reviewed by Sandra Ellison, RN (Registered Nurse) on 01/27/22 at 1302  Med List Status: <None>   Medication Order Taking? Sig Documenting Provider Last Dose Status Informant  Accu-Chek Softclix Lancets lancets [id number] Yes Use as instructed Sandra Lozano, Sandra Plummer, DO Taking Active   atorvastatin (LIPITOR) 20 MG tablet [id number] Yes Take 1 tablet (20 mg total) by mouth every evening. Sandra Lozano, Sandra Plummer, DO Taking Active   Blood Glucose Monitoring Suppl w/Device KIT [id number] Yes 1 kit by Does not apply route daily. Sandra Fee, DO Taking Active   Blood Pressure Monitoring (BLOOD PRESSURE CUFF) MISC [id number] Yes Check your blood pressure in the morning when you wake up, and at night before you go to bed Sandra Lozano, Sandra Plummer, DO Taking Active   CARESTART COVID-19 HOME TEST KIT [id number]  See admin instructions. [provider]  Active   diclofenac  Sodium (VOLTAREN) 1 % GEL [id number] No Apply 4 g topically 3 (three) times daily as needed.  Patient not taking: Reported on 08/05/2021   Sandra Killings, MD Not Taking Consider Medication Status and Discontinue (Completed Course)   gabapentin (NEURONTIN) 600 MG tablet [id number] Yes TAKE 1 TABLET (600 MG TOTAL) BY MOUTH 2 (TWO) TIMES DAILY. Sandra Lozano, Sandra Plummer, DO Taking Active   glucose blood test strip [id number] Yes Please use to check blood sugar once daily. Sandra Lozano, Sandra Plummer, DO Taking Active   lisinopril-hydrochlorothiazide (ZESTORETIC) 20-12.5 MG tablet [id number] Yes Take 2 tablets by mouth daily. Sandra Lozano, Sandra Plummer, DO Taking Active   meloxicam (MOBIC) 15 MG tablet [id number] Yes TAKE 1 TABLET (15 MG TOTAL) BY MOUTH DAILY. Sandra Lozano, Sandra Plummer, DO Taking Active   metFORMIN (GLUCOPHAGE) 500 MG tablet [id number] Yes Take 1 tablet (500 mg total) by mouth daily with breakfast.  Patient taking differently: Take 500 mg by mouth 2 (two) times daily with a meal.   Sandra Lozano, [place], DO Taking Active Self  Omega-3 Fatty Acids (FISH OIL) 1000 MG CAPS [id number] No Take 1,000 mg by mouth at bedtime.  Patient not taking: Reported on 08/05/2021   [provider] Not Taking Active            Med Note Sandra Lozano Jan 27, 2022  1:02 PM) Patient would like a Rx for fish oil so she doesn't have to buy it OTC and so insurance will cover it  triamcinolone ointment (KENALOG) 0.1 % [id number] Yes Apply 1 application topically 2 (two) times daily. Apply to scalp. Don't use more than 2 weeks in a row without physician approval.  Sandra Fee, DO Taking Active             Patient Active Problem List   Diagnosis Date Noted   Bilateral carpal tunnel syndrome 12/21/2021   Arthritis of finger of left hand 11/26/2021   Chest wall pain 04/20/2021   Hot flashes 04/20/2021   Traumatic arthritis of left foot    Skin lesion of scalp 06/04/2020   Arthritis of midfoot 03/14/2020   History of lumbar  fusion 03/14/2020   Neuropathy 11/07/2019   Pre-diabetes 11/07/2019   Nummular eczema 07/06/2019   Lumbar stenosis 05/20/2019   Acute left-sided low back pain without sciatica 07/18/2018   Osteoarthritis of right hip 06/03/2018   Chronic right shoulder pain 05/05/2018   Right hip pain 05/05/2018   Rupture of anterior cruciate ligament of right knee 02/15/2018   Sprain of medial collateral ligament of right knee 02/15/2018   Essential hypertension 02/15/2018     Care Plan : [place]  Updates made by Sandra Ellison, RN since 01/27/2022 12:00 AM     Problem: Chronic Disease Management and Care Coordination Needs realted to HTN, prediabetes, and chronic pain   Priority: High     Goal: Development of Plan Of Care For Chronic Disease Management and Care Coordination Needs To Assist With Meeting Treatment Goals for HTN, prediabetes and chronic pain   Start Date: 08/05/2021  Expected End Date: 08/05/2022  Priority: High  Note:   Current Barriers:  Chronic Disease Management support and education needs related to HTN, prediabetes and chronic pain- patient was lost to follow up after an initial evaluation in August 2022; successful outreach to patient for follow up assessment on 01/27/22 with apologies for lost to follow up . Patient is requesting assistance from this RNCM with:  perimenopausal hot flash treatments-she says her sister died of breat cancer so she is seeking non hormonal treatment ; securing nebulizer machine to treat her intermittent but chronic dyspnea - she says she has the medications and is currently using her friend's nebulizer machine, and suggestions to treat right carpal tunnel hand and wrist pain  RNCM Clinical Goal(s):  Patient will verbalize understanding of plan for management of HTN , prediabetes and chronic pain through collaboration with RN Care manager, provider, and care team.   Interventions: Inter-disciplinary care team collaboration (see  longitudinal plan of care) Evaluation of current treatment plan related to  self management and patient's adherence to plan as established by provider Discussed nonpharmacologic and pharmacologic treatment for hot flashes Discussed requesting PCP increase gabapentin dosage at hs to treat carpal tunnel pain in right hand and wrist Sent message to patient's primary care provider requesting: order for home nebulizer, non hormonal treatment of hot flashes( off label use of Clonidine and/or Paxil or other SSRI) , and request increase in gabapentin at hs   Hypertension: (Status: Goal on Track (progressing): YES.)Long Term- per patient >75% of BP readings at home and at provider's office are meeting treatment target of <140/<90 Last practice recorded BP readings:  BP Readings from Last 3 Encounters:  12/20/21 126/80  11/26/21 (!) 162/85  10/30/21 131/73    Most recent eGFR/CrCl:  Lab Results  Component Value Date   EGFR 100 05/10/2021    No components found for: CRCL  Evaluation of current treatment plan related to hypertension self management and patient's adherence to plan as established by provider;   Reviewed medications with patient and discussed importance of compliance;  Ensured patient had home  BP monitor  Assessed frequency of home BP monitoring and variance of home BP readings Assessed BP readings in provider's office via EMR review Discussed plans with patient for ongoing care management follow up and provided patient with direct contact information for care management team;   Pre- Diabetes Interventions:  (Status:  New goal.) Long Term Goal Assessed patient's understanding of A1c goal:  <6.4% Reviewed medications with patient and discussed importance of medication adherence Counseled on importance of regular laboratory monitoring as prescribed Discussed plans with patient for ongoing care management follow up and provided patient with direct contact information for care  management team Advised patient, providing education and rationale, to check cbg (every morning -fasting)  and record, calling PCP for findings outside established parameters Review of patient status, including review of consultants reports, relevant laboratory and other test results, and medications completed Lab Results  Component Value Date   HGBA1C 6.1 08/22/2020     Pain Interventions:  (Status:  New goal.) Long Term Goal- patient states her primary pain issues today are carpal tunnel syndrome pain in right hand and wrist; states she is adherent with wearing right wrist brace and taking Mobic and gabapentin as prescribed Medications reviewed Reviewed provider established plan for pain management Discussed importance of adherence to all scheduled medical appointments Counseled on the importance of reporting any/all new or changed pain symptoms or management strategies to pain management provider Advised patient to report to care team affect of pain on daily activities Advised patient this RNCM will message provider about   increasing gabapentin dose at hs to treat right wrist and hand pain  Patient Goals/Self-Care Activities: Patient will self administer medications as prescribed Patient will attend all scheduled provider appointments Patient will call pharmacy for medication refills Patient will continue to perform ADL's independently Patient will continue to perform IADL's independently Patient will call provider office for new concerns or questions       Follow Up:  Patient agrees to Care Plan and Follow-up.  Plan: The Managed Medicaid care management team will reach out to the patient again over the next 30 days.  Date/time of next scheduled RN care management/care coordination outreach:  02/17/22 at 12:30 pm Kelli Churn RN, CCM, [place] Management Coordinator - Managed [place] High Risk [phone]

## 2022-01-29 ENCOUNTER — Telehealth: Payer: Self-pay | Admitting: Family Medicine

## 2022-01-29 ENCOUNTER — Other Ambulatory Visit: Payer: Self-pay | Admitting: Family Medicine

## 2022-01-29 MED ORDER — FISH OIL 1000 MG PO CAPS: 1000.0000 mg | ORAL_CAPSULE | Freq: Every day | ORAL | 1 refills | Status: DC

## 2022-01-29 NOTE — Telephone Encounter (Addendum)
Called. Scheduled for 2/22 @ 230 PM. Will address the below.   Evva Din Autry-Lott, DO 01/29/2022, 8:45 AM PGY-3, [facility]   ----- Message from Bary Richard, RN sent at 01/28/2022  9:24 AM EST ----- Dr Salvadore Dom, I completed a follow up telephone assessment with Ms Dorff yesterday as she is participating in the management Medicaid disease management program offered by her [facility] health plan. She asked that I assist her with the following  ( see my note of yesterday for completed details) - 1) a home nebulizer machine to treat her chronic intermittent dyspnea, she says she was using a nebulizer when she live d in [place] and is currently using a friend's. She says she has the nebulizer medications.  2) would you consider increasing her gabapentin hs dose to treat her carpal tunnel symptoms 3) She is extremely bothered with hot flashes. Her sister died from breast cancer so I told her she would likely need non hormonal treatment such as an SSRI or clonidine (I was in the same situation and the clonidine was a life changer for me). 4) Would you prescribe fish oil so her insurance will cover? She says she is no longer buying it OTC.  Thank you in advance for your consideration of these issues. Mardee Postin RN, CCM, [facility] [place]   [facility] Care Management Coordinator - Managed [place] High Risk [phone]

## 2022-01-31 NOTE — Progress Notes (Signed)
Office Visit Note  Patient: Sandra Lozano             Date of Birth: May 01, 1961           MRN: [id number]             PCP: [name], DO Referring: Marybelle Killings, MD Visit Date: 02/14/2022 Occupation: @GUAROCC @  Subjective:  Pain in multiple joints  History of Present Illness: Sandra Lozano is a 61 y.o. female seen in consultation per request of Dr. Lorin Mercy for positive ANA and joint pain.  According the patient her symptoms started about 6 years ago with right knee joint injury after a fall.  She was told that she had a cruciate ligament tear.  She was has been under care of Dr. Sharol Given and has been getting cortisone injections and takes NSAIDs for chronic pain.  She states that in 2020 she started having left lower extremity pain for which she was evaluated by Dr. Inda Merlin.  The MRI of the lumbar spine showed degenerative disc disease and spinal stenosis.  She underwent lumbar spine fusion in June 2020.  The radiculopathy has stopped after the surgery.  But she started having pain in her left foot.  She was seen by Dr. Sharol Given and was told that she had osteoarthritis.  She states that he operated on her foot and screws were placed.  She has residual numbness after the surgery in her foot.  Recently she has been having difficulty walking due to pain in the lateral aspect of her left foot.  She is an appointment with Dr. Sharol Given next week to discuss this.  She also had recent left third toe fracture which healed by itself.  She has history of right shoulder joint chronic pain for which she has seen Dr. Lorin Mercy in the past and was told that she has osteoarthritis.  She is currently not having discomfort in her shoulder.  She also has numbness in her bilateral hands her PCPs office advised bilateral carpal tunnel braces at nighttime.  She continues to have pain and discomfort in her hands and she has noticed that her fingers have been turning.  She was recently seen by Dr. Sharol Given who did some lab work and her ANA  was positive for that reason she was referred to me.  She denies any history of joint swelling.  Patient denies any history of oral ulcers, nasal ulcers, malar rash, photosensitivity, Raynaud's phenomenon or lymphadenopathy.  There is no family history of autoimmune disease.  There is no history of psoriasis in patient or her family.  Activities of Daily Living:  Patient reports morning stiffness for 1 hour.   Patient Reports nocturnal pain.  Difficulty dressing/grooming: Denies Difficulty climbing stairs: Reports Difficulty getting out of chair: Reports Difficulty using hands for taps, buttons, cutlery, and/or writing: Denies  Review of Systems  Constitutional:  Negative for fatigue, night sweats, weight gain and weight loss.  HENT:  Negative for mouth sores, trouble swallowing, trouble swallowing, mouth dryness and nose dryness.   Eyes:  Positive for dryness. Negative for pain, redness, itching and visual disturbance.  Respiratory:  Negative for cough and difficulty breathing.   Cardiovascular:  Negative for chest pain, palpitations, hypertension, irregular heartbeat and swelling in legs/feet.  Gastrointestinal:  Negative for blood in stool, constipation and diarrhea.  Endocrine: Negative for increased urination.  Genitourinary:  Negative for difficulty urinating and vaginal dryness.  Musculoskeletal:  Positive for joint pain, joint pain and morning stiffness. Negative for joint  swelling, myalgias, muscle weakness, muscle tenderness and myalgias.  Skin:  Negative for color change, rash, hair loss, skin tightness, ulcers and sensitivity to sunlight.  Allergic/Immunologic: Negative for susceptible to infections.  Neurological:  Positive for numbness. Negative for dizziness, headaches, memory loss, night sweats and weakness.  Hematological:  Positive for bruising/bleeding tendency. Negative for swollen glands.  Psychiatric/Behavioral:  Negative for depressed mood, confusion and sleep  disturbance. The patient is not nervous/anxious.    PMFS History:  Patient Active Problem List   Diagnosis Date Noted   COPD, mild ([place]) 02/11/2022   Bilateral carpal tunnel syndrome 12/21/2021   Arthritis of finger of left hand 11/26/2021   Chest wall pain 04/20/2021   Hot flashes 04/20/2021   Traumatic arthritis of left foot    Skin lesion of scalp 06/04/2020   Arthritis of midfoot 03/14/2020   History of lumbar fusion 03/14/2020   Neuropathy 11/07/2019   Pre-diabetes 11/07/2019   Nummular eczema 07/06/2019   Lumbar stenosis 05/20/2019   Acute left-sided low back pain without sciatica 07/18/2018   Osteoarthritis of right hip 06/03/2018   Chronic right shoulder pain 05/05/2018   Right hip pain 05/05/2018   Rupture of anterior cruciate ligament of right knee 02/15/2018   Sprain of medial collateral ligament of right knee 02/15/2018   Essential hypertension 02/15/2018    Past Medical History:  Diagnosis Date   Arthritis    right knee, lower back   Dyspnea    very rare -tx with albuterol neb sol if needed   Hyperlipidemia    Hypertension    Pre-diabetes    per patient - does not check blood sugar   SVD (spontaneous vaginal delivery)    x 5 - only 2 living, 1 stillborn and 1 premature at 7 months demise,1 child deceased   Wears dentures    upper and lower    Family History  Problem Relation Age of Onset   Cancer Sister    Healthy Daughter    Healthy Daughter    Past Surgical History:  Procedure Laterality Date   FOOT ARTHRODESIS Left 11/02/2020   Procedure: LEFT MIDFOOT FUSION BASE 1ST AND 2ND METATARSAL;  Surgeon: Newt Minion, MD;  Location: [facility];  Service: Orthopedics;  Laterality: Left;   LUMBAR FUSION  05/20/2019   GILL PROCEDURE, LEFT TRANSFORAMINAL LUMBAR INTERBODY FUSION, PEDICLE INSTRUMENTATION, BILATERAL FUSION (N/A   TUBAL LIGATION     Social History   Social History Narrative   Not on file   Immunization History  Administered Date(s)  Administered   Influenza,inj,Quad PF,6+ Mos 09/06/2018, 08/31/2019, 08/31/2020   PFIZER Comirnaty(Gray Top)Covid-19 Tri-Sucrose Vaccine 09/29/2020   PFIZER(Purple Top)SARS-COV-2 Vaccination 12/27/2019, 01/17/2020   PPD Test 01/18/2019   Tdap 08/31/2020     Objective: Vital Signs: BP (!) 160/91 (BP Location: Right Arm, Patient Position: Sitting, Cuff Size: Large)    Pulse 65    Ht 5' 4"  (1.626 m)    Wt 217 lb (98.4 kg)    LMP  (LMP Unknown)    BMI 37.25 kg/m    Physical Exam Vitals and nursing note reviewed.  Constitutional:      Appearance: She is well-developed.  HENT:     Head: Normocephalic and atraumatic.  Eyes:     Conjunctiva/sclera: Conjunctivae normal.  Cardiovascular:     Rate and Rhythm: Normal rate and regular rhythm.     Heart sounds: Normal heart sounds.  Pulmonary:     Effort: Pulmonary effort is normal.  Breath sounds: Normal breath sounds.  Abdominal:     General: Bowel sounds are normal.     Palpations: Abdomen is soft.  Musculoskeletal:     Cervical back: Normal range of motion.  Lymphadenopathy:     Cervical: No cervical adenopathy.  Skin:    General: Skin is warm and dry.     Capillary Refill: Capillary refill takes less than 2 seconds.  Neurological:     Mental Status: She is alert and oriented to person, place, and time.  Psychiatric:        Behavior: Behavior normal.     Musculoskeletal Exam: C-spine was in good range of motion.  She had painful range of motion of her right shoulder joint.  Elbow joints with good range of motion.  Wrist joints with good range of motion.  She had bilateral PIP and DIP thickening with subluxation of bilateral first DIPs.  No synovitis was noted.  Hip joints with good range of motion.  Knee joints with good range of motion with no warmth swelling or effusion.  There was no tenderness over ankles.  She had bilateral pes planus and hammertoes.  She over supinates her feet when she walks.  CDAI Exam: CDAI Score:  -- Patient Global: --; Provider Global: -- Swollen: --; Tender: -- Joint Exam 02/14/2022   No joint exam has been documented for this visit   There is currently no information documented on the homunculus. Go to the Rheumatology activity and complete the homunculus joint exam.  Investigation: No additional findings.  Imaging: XR Hand 2 View Right  Result Date: 02/14/2022 Severe CMC narrowing was noted.  Severe first toe PIP narrowing was noted with spurring.  PIP and DIP narrowing was noted.  No MCP, intercarpal or radiocarpal joint space narrowing was noted. Impression: These findings are consistent with osteoarthritis of the hand.   Recent Labs: Lab Results  Component Value Date   WBC 11.4 (H) 05/21/2019   HGB 14.6 11/02/2020   PLT 284 05/21/2019   NA 142 10/30/2021   K 5.1 10/30/2021   CL 100 10/30/2021   CO2 26 10/30/2021   GLUCOSE 87 10/30/2021   BUN 12 10/30/2021   CREATININE 0.69 10/30/2021   BILITOT 0.6 05/18/2019   ALKPHOS 97 05/18/2019   AST 16 05/18/2019   ALT 21 05/18/2019   PROT 7.5 05/18/2019   ALBUMIN 4.0 05/18/2019   CALCIUM 10.2 10/30/2021   GFRAA 111 08/22/2020    Speciality Comments: No specialty comments available.  Procedures:  No procedures performed Allergies: Patient has no known allergies.   Assessment / Plan:     Visit Diagnoses: Chronic right shoulder pain-she has chronic discomfort in her right shoulder joint.  I reviewed x-rays of the right shoulder joint from June 2019 which showed mild degenerative changes in the right glenohumeral and acromioclavicular joints.  Muscle strengthening exercises were discussed.  Pain in both hands -she has been experiencing pain and discomfort in her bilateral hands.  She denies any history of joint swelling.  She is concerned about the deformity that she is developing in her hands.  I reviewed x-rays done by Dr. Sharol Given of her left hand from December 2022.  I also obtain x-rays of her right hand today.  The  x-ray findings were consistent with osteoarthritis.  She has developed subluxation of her left index finger DIP joint.  Joint protection muscle strengthening was discussed.  I will also refer her to physical therapy.  - Plan: XR Hand 2 View  Right, x-ray findings were discussed with the patient.  Ambulatory referral to Physical Therapy  Positive ANA -her labs indicated low titer positive ANA.  She has no clinical features of lupus.  She denies any history of oral ulcers, nasal ulcers, malar rash, photosensitivity, Raynaud's phenomenon or lymphadenopathy.  There is no history of inflammatory arthritis.  No further work-up is required.  I advised her to contact me in case she develops any new symptoms.  11/26/21: ANA 1:80NH, ESR 11, uric acid 5.2, RF<14   Bilateral carpal tunnel syndrome-patient states she has paresthesias in her hands at times.  Her PCP advised bilateral carpal tunnel braces which has been helpful.  DDD (degenerative disc disease), lumbar with spinal stenosis - s/p fusion 2020 by Dr. Lorin Mercy.  She developed left-sided radiculopathy due to spinal stenosis in 2020.  Her symptoms have improved since she had a spinal fusion.  Primary osteoarthritis of both hips -she denies any discomfort today.  Mild osteoarthritic changes were noted on the x-rays in 2019.  Chronic pain of right knee - History of cruciate ligament tear.  She is followed by Dr. Sharol Given and gets frequent cortisone injections.  Pain in left foot - Chronic pain.  Followed by Dr. Sharol Given.  I reviewed x-rays of her feet.  She had postsurgical changes in her left foot.  She also has severe intertarsal joint arthritis and dorsal spurring.  She had bilateral pes planus and hammertoes.  She may benefit from orthotics.  I advised her to discuss this further with Dr. Sharol Given.  Essential hypertension-blood pressure was elevated today.  She was advised to monitor blood pressure closely and follow-up with her PCP.  Other medical problems are listed  as follows:  Pre-diabetes  Neuropathy  Nummular eczema  Orders: Orders Placed This Encounter  Procedures   XR Hand 2 View Right   Ambulatory referral to Physical Therapy   No orders of the defined types were placed in this encounter.    Follow-Up Instructions: Return if symptoms worsen or fail to improve, for Osteoarthritis.   Bo Merino, MD  Note - This record has been created using Editor, commissioning.  Chart creation errors have been sought, but may not always  have been located. Such creation errors do not reflect on  the standard of medical care.

## 2022-02-04 NOTE — Patient Instructions (Addendum)
It was wonderful to see you today.  Please bring ALL of your medications with you to every visit.   Today we talked about:  Mouth breathing-it does not appear that you have had any pulmonary function testing done.  Please schedule a visit with Dr. Raymondo Band to get this done. Today we discussed carpal tunnel syndrome-continue wrist splint at night.  I have increased your gabapentin.  Follow-up in 3 to 4 weeks if continues to be an issue.  Please be sure to schedule follow up at the front  desk before you leave today.   If you haven't already, sign up for My Chart to have easy access to your labs results, and communication with your primary care physician.  Please call the clinic at [phone] if your symptoms worsen or you have any concerns. It was our pleasure to serve you.  Dr. Salvadore Dom

## 2022-02-04 NOTE — Progress Notes (Signed)
° ° °  SUBJECTIVE:   CHIEF COMPLAINT / HPI:   Desires nebulizer machine Using friends nebulizer machine for what she describes as a long time of chest congestion that makes her mouth breath. She denies prior history of asthma and COPD. Endorses smoking history of 30-40 yeas and recently quit 5 years ago. Denies current shortness of breath, wheezing.   Carpal tunnel syndrome Began 3 weeks prior. Had a visit recently and has tried bracing but right wrist continues to have intermittent numbness and tingling. She usually shakes out her hand and it goes away. She denies dropping objects or continual numbness or tingling.   HM Needs repeat lipid panel  PERTINENT  PMH / PSH: Neuropathy  OBJECTIVE:   BP (!) 142/81    Pulse 89    Ht 5\' 4"  (1.626 m)    Wt 211 lb 3.2 oz (95.8 kg)    LMP  (LMP Unknown)    SpO2 100%    BMI 36.25 kg/m   Physical Exam Vitals reviewed.  Constitutional:      General: She is not in acute distress.    Appearance: She is not ill-appearing, toxic-appearing or diaphoretic.  Cardiovascular:     Rate and Rhythm: Normal rate and regular rhythm.     Heart sounds: Normal heart sounds.  Pulmonary:     Effort: Pulmonary effort is normal. No respiratory distress.     Breath sounds: Normal breath sounds. No stridor. No wheezing or rhonchi.  Musculoskeletal:     Right wrist: No swelling or deformity. Normal range of motion. Normal pulse.     Comments: Right Hand: Special tests: Positive tinel's at the carpal tunnel  Fingers:  No swelling in PIP, DIP joints b/l. No thenar wasting.    Neurological:     Mental Status: She is alert and oriented to person, place, and time.  Psychiatric:        Mood and Affect: Mood normal.        Behavior: Behavior normal.   ASSESSMENT/PLAN:   Dyspnea, unspecified type Desires nebulizer. Intermittent dyspnea. No diagnosis of COPD or asthma. 30-40 year smoking history. Well today, CTAB without wheezing or dyspnea. Vitals are stable. Will  send to Dr. for PFTs prior to treatment if necessary.   Carpal tunnel syndrome of right wrist Ongoing for 3 weeks. No right worse than left. Will give anti-inflammatory and increase gabapentin. Continue wrist splints. Given reassurance that this could take several weeks to resolve fully. Consider NCS if thenar wasting or dropping objects or continual paresthesias.  - meloxicam (MOBIC) 15 MG tablet; Take 1 tablet (15 mg total) by mouth daily.  Dispense: 30 tablet; Refill: 0 - gabapentin (NEURONTIN) 600 MG tablet; Take 1 tablet (600 mg total) by mouth 3 (three) times daily.  Dispense: 60 tablet; Refill: 0  Hyperlipidemia, unspecified hyperlipidemia type - Lipid Panel - atorvastatin (LIPITOR) 20 MG tablet; Take 1 tablet (20 mg total) by mouth every evening.  Dispense: 90 tablet; Refill: 0    Raymondo Band, DO [facility]

## 2022-02-05 ENCOUNTER — Encounter: Payer: Self-pay | Admitting: Family Medicine

## 2022-02-05 ENCOUNTER — Other Ambulatory Visit: Payer: Self-pay

## 2022-02-05 ENCOUNTER — Ambulatory Visit (INDEPENDENT_AMBULATORY_CARE_PROVIDER_SITE_OTHER): Payer: Medicaid Other | Admitting: Family Medicine

## 2022-02-05 VITALS — BP 142/81 | HR 89 | Ht 64.0 in | Wt 211.2 lb

## 2022-02-05 DIAGNOSIS — R0600 Dyspnea, unspecified: Secondary | ICD-10-CM

## 2022-02-05 DIAGNOSIS — G629 Polyneuropathy, unspecified: Secondary | ICD-10-CM

## 2022-02-05 DIAGNOSIS — E785 Hyperlipidemia, unspecified: Secondary | ICD-10-CM

## 2022-02-05 DIAGNOSIS — G5601 Carpal tunnel syndrome, right upper limb: Secondary | ICD-10-CM

## 2022-02-05 MED ORDER — ATORVASTATIN CALCIUM 20 MG PO TABS: 20.0000 mg | ORAL_TABLET | Freq: Every evening | ORAL | 0 refills | Status: DC

## 2022-02-05 MED ORDER — MELOXICAM 15 MG PO TABS: 15.0000 mg | ORAL_TABLET | Freq: Every day | ORAL | 0 refills | Status: DC

## 2022-02-05 MED ORDER — GABAPENTIN 600 MG PO TABS: 600.0000 mg | ORAL_TABLET | Freq: Three times a day (TID) | ORAL | 0 refills | Status: DC

## 2022-02-06 LAB — LIPID PANEL
Chol/HDL Ratio: 2.8 ratio (ref 0.0–4.4)
Cholesterol, Total: 159 mg/dL (ref 100–199)
HDL: 57 mg/dL (ref >39)
LDL Chol Calc (NIH): 81 mg/dL (ref 0–99)
Triglycerides: 117 mg/dL (ref 0–149)
VLDL Cholesterol Cal: 21 mg/dL (ref 5–40)

## 2022-02-11 ENCOUNTER — Other Ambulatory Visit: Payer: Self-pay

## 2022-02-11 ENCOUNTER — Ambulatory Visit (INDEPENDENT_AMBULATORY_CARE_PROVIDER_SITE_OTHER): Payer: Medicaid Other | Admitting: Pharmacist

## 2022-02-11 ENCOUNTER — Encounter: Payer: Self-pay | Admitting: Pharmacist

## 2022-02-11 DIAGNOSIS — J449 Chronic obstructive pulmonary disease, unspecified: Secondary | ICD-10-CM | POA: Diagnosis not present

## 2022-02-11 DIAGNOSIS — R0600 Dyspnea, unspecified: Secondary | ICD-10-CM | POA: Insufficient documentation

## 2022-02-11 NOTE — Progress Notes (Signed)
Reviewed: I agree with Dr. Koval's documentation and management. 

## 2022-02-11 NOTE — Patient Instructions (Addendum)
It was nice to see you today!  Your lung function test revealed some mild obstruction in your lungs.  Continue to due 30 minutes of walking about 2-3 times a week to build lung endurance. We discussed your 3 month goal to walk a mile in 16-18 minutes.  Congratulations on quitting smoking. Keep up the good work.

## 2022-02-11 NOTE — Progress Notes (Signed)
° °  S:    Patient arrives in good spirits. Presents for lung function evaluation.   Patient was referred and last seen by Primary Care Provider, Dr. Salvadore Dom, on 02/05/22.    Patient reports breathing has been suboptimal. She reports shortness of breath often. Breathing worse upon exertion. She cannot walk up a flight of stairs without being short of breath. Patient endorses allergy to cat dander/fur. Patient allergies are not related to seasonal changes or changes in temperature.  Patient previously had a PFT approximately 10 years ago. She states that she remembers her lung age was reported as age 32.  Patient has a 35 year tobacco history. She quit 5 years ago. She recently quit smoking marijuana two months ago. Cough was worse while smoking marijuana. Cough is minimal at this time.   Patient reports adherence to medications Patient reports last dose of asthma medications was 3 weeks ago with an exacerbation Current asthma medications: Albuterol Nebulizer (about 1 month of use since starting neb use) Rescue inhaler use frequency: infrequent  O: Physical Exam Constitutional:      Appearance: Normal appearance.  Neurological:     Mental Status: She is alert.  Psychiatric:        Mood and Affect: Mood normal.        Behavior: Behavior normal.        Thought Content: Thought content normal.    Review of Systems  Respiratory:  Positive for shortness of breath. Negative for sputum production.   All other systems reviewed and are negative.   See "scanned report" or Documentation Flowsheet (discrete results - PFTs) for  Spirometry results. Patient provided good effort while attempting spirometry.   Lung Age = 10  A/P: Patient has been experiencing shortness of breath for 10+ years and has taken albuterol in the past. Currently not on any inhaled or respiratory medications. 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 Post nebulized albuterol tx was not done due to  results of spirometry.  Obstruction and lung deconditioning in past smoker of both tobacco and marijuana.  -Based on results of spirometry, no medication changes. Could consider inhaled anticholinergic in the future.  -Reviewed results of pulmonary function tests.  Discussed increase in walking/weight loss as strategies to improve/recover lung conditioning.  -Patient set a goal to walk a mile in 16-18 minutes in 3-6 months.  Patient verbalized understanding of results and education.  Written pt instructions provided. Total time in face to face counseling 30 minutes.  Patient seen with Penny Pia, PharmD Candidate.

## 2022-02-11 NOTE — Assessment & Plan Note (Addendum)
.  Patient has been experiencing shortness of breath for 10+ years and has taken albuterol in the past. Currently not on any inhaled or respiratory medications. 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 Post nebulized albuterol tx was not done due to results of spirometry.  Obstruction and lung deconditioning in past smoker of both tobacco and marijuana.  -Based on results of spirometry, no medication changes. Could consider inhaled anticholinergic in the future.  -Reviewed results of pulmonary function tests.  Discussed increase in walking/weight loss as strategies to improve/recover lung conditioning.  -Patient set a goal to walk a mile in 16-18 minutes in 3-6 months.

## 2022-02-12 ENCOUNTER — Telehealth: Payer: Self-pay | Admitting: Orthopedic Surgery

## 2022-02-12 DIAGNOSIS — Z419 Encounter for procedure for purposes other than remedying health state, unspecified: Secondary | ICD-10-CM | POA: Diagnosis not present

## 2022-02-12 NOTE — Telephone Encounter (Signed)
Called pt 2x to sch appt per mychart for foot pain, unable to leave message to sch ?

## 2022-02-14 ENCOUNTER — Ambulatory Visit: Payer: Self-pay

## 2022-02-14 ENCOUNTER — Other Ambulatory Visit: Payer: Self-pay

## 2022-02-14 ENCOUNTER — Encounter: Payer: Self-pay | Admitting: Rheumatology

## 2022-02-14 ENCOUNTER — Ambulatory Visit (INDEPENDENT_AMBULATORY_CARE_PROVIDER_SITE_OTHER): Payer: Medicaid Other | Admitting: Rheumatology

## 2022-02-14 VITALS — BP 160/91 | HR 65 | Ht 64.0 in | Wt 217.0 lb

## 2022-02-14 DIAGNOSIS — M5136 Other intervertebral disc degeneration, lumbar region: Secondary | ICD-10-CM

## 2022-02-14 DIAGNOSIS — L300 Nummular dermatitis: Secondary | ICD-10-CM

## 2022-02-14 DIAGNOSIS — I10 Essential (primary) hypertension: Secondary | ICD-10-CM | POA: Diagnosis not present

## 2022-02-14 DIAGNOSIS — M79641 Pain in right hand: Secondary | ICD-10-CM | POA: Diagnosis not present

## 2022-02-14 DIAGNOSIS — M25561 Pain in right knee: Secondary | ICD-10-CM

## 2022-02-14 DIAGNOSIS — G5603 Carpal tunnel syndrome, bilateral upper limbs: Secondary | ICD-10-CM

## 2022-02-14 DIAGNOSIS — R768 Other specified abnormal immunological findings in serum: Secondary | ICD-10-CM | POA: Diagnosis not present

## 2022-02-14 DIAGNOSIS — M19042 Primary osteoarthritis, left hand: Secondary | ICD-10-CM

## 2022-02-14 DIAGNOSIS — R7303 Prediabetes: Secondary | ICD-10-CM | POA: Diagnosis not present

## 2022-02-14 DIAGNOSIS — Z981 Arthrodesis status: Secondary | ICD-10-CM

## 2022-02-14 DIAGNOSIS — M48062 Spinal stenosis, lumbar region with neurogenic claudication: Secondary | ICD-10-CM

## 2022-02-14 DIAGNOSIS — G629 Polyneuropathy, unspecified: Secondary | ICD-10-CM | POA: Diagnosis not present

## 2022-02-14 DIAGNOSIS — G8929 Other chronic pain: Secondary | ICD-10-CM

## 2022-02-14 DIAGNOSIS — M79642 Pain in left hand: Secondary | ICD-10-CM

## 2022-02-14 DIAGNOSIS — S83411A Sprain of medial collateral ligament of right knee, initial encounter: Secondary | ICD-10-CM

## 2022-02-14 DIAGNOSIS — M79672 Pain in left foot: Secondary | ICD-10-CM

## 2022-02-14 DIAGNOSIS — M25511 Pain in right shoulder: Secondary | ICD-10-CM

## 2022-02-14 DIAGNOSIS — S83511A Sprain of anterior cruciate ligament of right knee, initial encounter: Secondary | ICD-10-CM

## 2022-02-14 DIAGNOSIS — M160 Bilateral primary osteoarthritis of hip: Secondary | ICD-10-CM | POA: Diagnosis not present

## 2022-02-14 DIAGNOSIS — M1611 Unilateral primary osteoarthritis, right hip: Secondary | ICD-10-CM

## 2022-02-14 NOTE — Patient Instructions (Signed)
Hand Exercises Hand exercises can be helpful for almost anyone. These exercises can strengthen the hands, improve flexibility and movement, and increase blood flow to the hands. These results can make work and daily tasks easier. Hand exercises can be especially helpful for people who have joint pain from arthritis or have nerve damage from overuse (carpal tunnel syndrome). These exercises can also help people who have injured a hand. Exercises Most of these hand exercises are gentle stretching and motion exercises. It is usually safe to do them often throughout the day. Warming up your hands before exercise may help to reduce stiffness. You can do this with gentle massage or by placing your hands in warm water for 10-15 minutes. It is normal to feel some stretching, pulling, tightness, or mild discomfort as you begin new exercises. This will gradually improve. Stop an exercise right away if you feel sudden, severe pain or your pain gets worse. Ask your health care provider which exercises are best for you. Knuckle bend or "claw" fist  Stand or sit with your arm, hand, and all five fingers pointed straight up. Make sure to keep your wrist straight during the exercise. Gently bend your fingers down toward your palm until the tips of your fingers are touching the top of your palm. Keep your big knuckle straight and just bend the small knuckles in your fingers. Hold this position for __________ seconds. Straighten (extend) your fingers back to the starting position. Repeat this exercise 5-10 times with each hand. Full finger fist  Stand or sit with your arm, hand, and all five fingers pointed straight up. Make sure to keep your wrist straight during the exercise. Gently bend your fingers into your palm until the tips of your fingers are touching the middle of your palm. Hold this position for __________ seconds. Extend your fingers back to the starting position, stretching every joint fully. Repeat  this exercise 5-10 times with each hand. Straight fist Stand or sit with your arm, hand, and all five fingers pointed straight up. Make sure to keep your wrist straight during the exercise. Gently bend your fingers at the big knuckle, where your fingers meet your hand, and the middle knuckle. Keep the knuckle at the tips of your fingers straight and try to touch the bottom of your palm. Hold this position for __________ seconds. Extend your fingers back to the starting position, stretching every joint fully. Repeat this exercise 5-10 times with each hand. Tabletop  Stand or sit with your arm, hand, and all five fingers pointed straight up. Make sure to keep your wrist straight during the exercise. Gently bend your fingers at the big knuckle, where your fingers meet your hand, as far down as you can while keeping the small knuckles in your fingers straight. Think of forming a tabletop with your fingers. Hold this position for __________ seconds. Extend your fingers back to the starting position, stretching every joint fully. Repeat this exercise 5-10 times with each hand. Finger spread  Place your hand flat on a table with your palm facing down. Make sure your wrist stays straight as you do this exercise. Spread your fingers and thumb apart from each other as far as you can until you feel a gentle stretch. Hold this position for __________ seconds. Bring your fingers and thumb tight together again. Hold this position for __________ seconds. Repeat this exercise 5-10 times with each hand. Making circles  Stand or sit with your arm, hand, and all five fingers pointed   straight up. Make sure to keep your wrist straight during the exercise. Make a circle by touching the tip of your thumb to the tip of your index finger. Hold for __________ seconds. Then open your hand wide. Repeat this motion with your thumb and each finger on your hand. Repeat this exercise 5-10 times with each hand. Thumb  motion  Sit with your forearm resting on a table and your wrist straight. Your thumb should be facing up toward the ceiling. Keep your fingers relaxed as you move your thumb. Lift your thumb up as high as you can toward the ceiling. Hold for __________ seconds. Bend your thumb across your palm as far as you can, reaching the tip of your thumb for the small finger (pinkie) side of your palm. Hold for __________ seconds. Repeat this exercise 5-10 times with each hand. Grip strengthening  Hold a stress ball or other soft ball in the middle of your hand. Slowly increase the pressure, squeezing the ball as much as you can without causing pain. Think of bringing the tips of your fingers into the middle of your palm. All of your finger joints should bend when doing this exercise. Hold your squeeze for __________ seconds, then relax. Repeat this exercise 5-10 times with each hand. Contact a health care provider if: Your hand pain or discomfort gets much worse when you do an exercise. Your hand pain or discomfort does not improve within 2 hours after you exercise. If you have any of these problems, stop doing these exercises right away. Do not do them again unless your health care provider says that you can. Get help right away if: You develop sudden, severe hand pain or swelling. If this happens, stop doing these exercises right away. Do not do them again unless your health care provider says that you can. This information is not intended to replace advice given to you by your health care provider. Make sure you discuss any questions you have with your health care provider. Document Revised: 03/21/2021 Document Reviewed: 03/21/2021 Elsevier Patient Education  2022 [employer] Inc.  

## 2022-02-17 ENCOUNTER — Other Ambulatory Visit: Payer: Self-pay | Admitting: *Deleted

## 2022-02-17 NOTE — Patient Instructions (Signed)
Visit Information ? ?Ms. Kham was given information about Medicaid Managed Care team care coordination services as a part of their [facility] Medicaid benefit. Verneice Caspers verbally consented to engagement with the [facility] Managed Care team.  ? ?If you are experiencing a medical emergency, please call 911 or report to your local emergency department or urgent care.  ? ?If you have a non-emergency medical problem during routine business hours, please contact your provider's office and ask to speak with a nurse.  ? ?For questions related to your [facility] health plan, please call: [phone] or go here:[email or web address] ? ?If you would like to schedule transportation through your [facility] plan, please call the following number at least 2 days in advance of your appointment: [phone]. ? You can also use the MTM portal or MTM mobile app to manage your rides. For the portal, please go to [email or web address]. ? ?Call the [place] at [phone], at any time, 24 hours a day, 7 days a week. If you are in danger or need immediate medical attention call 911. ? ?If you would like help to quit smoking, call 1-800-QUIT-NOW (847)312-4056) OR Espa?ol: 1-855-D?jelo-Ya 862-496-9679) o para m?s informaci?n haga clic aqu? or Text READY to 200-400 to register via text ? ?Ms. Marzette - following are the goals we discussed in your visit today:  ? Goals Addressed   ? ?Patient will self administer medications as prescribed ?Patient will attend all scheduled provider appointments ?Patient will call pharmacy for medication refills ?Patient will continue to perform ADL's independently ?Patient will continue to perform IADL's independently ?Patient will call provider office for new concerns or questions ? ? ?Patient verbalizes understanding of instructions and care plan provided today and agrees to view in [place]. Active MyChart status confirmed with patient.   ? ?The Managed Medicaid  care management team will reach out to the patient again over the next 30 days.  ? ?Kelli Churn RN, CCM, CDCES ?[facility] ?Care Management Coordinator - Managed Medicaid High Risk ?[phone]  ? ?Following is a copy of your plan of care:  ?Care Plan : [place]  ?Updates made by Barrington Ellison, RN since 02/17/2022 12:00 AM  ?  ? ?Problem: Chronic Disease Management and Care Coordination Needs realted to HTN, prediabetes, and chronic pain   ?Priority: High  ?  ? ?Long-Range Goal: Development of Plan Of Care For Chronic Disease Management and Care Coordination Needs To Assist With Meeting Treatment Goals for HTN, prediabetes and chronic pain   ?Start Date: 08/05/2021  ?Expected End Date: 08/05/2022  ?Priority: High  ?Note:   ?Current Barriers:  ?Chronic Disease Management support and education needs related to HTN, prediabetes and chronic pain- patient says she saw primary care provider on 02/05/22 and issues related securing nebulizer machine to treat her intermittent but chronic dyspnea were discussed. She had PFTS and spirometry and the results did not indicate the need for a nebulizer machine as she has mild pulmonary obstrutive disease. 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 She says the increase in Gabapentin dose has not effectively treated her pain. She says she will see her orthopedist on 3/8 23 to address pain in her left foot.  ? ?RNCM Clinical Goal(s):  ?Patient will verbalize  understanding of plan for management of HTN , prediabetes and chronic pain through collaboration with RN Care manager, provider, and care team.  ? ?Interventions: ?Inter-disciplinary care team collaboration (see longitudinal plan of care) ?Evaluation of current treatment plan related to  self  management and patient's adherence to plan as established by provider ? ? ?Hypertension: (Status: Goal on track: NO.)Long Term- per patient >75% of BP readings at home, she attributes higher readings at provider's office due to pain ?Last practice recorded BP readings:  ?BP Readings from Last 3 Encounters:  ?02/14/22 (!) 160/91  ?02/05/22 (!) 142/81  ?12/20/21 126/80  ?   ?Most recent eGFR/CrCl:  ?Lab Results  ?Component Value Date  ? EGFR 100 05/10/2021  ?  No components found for: CRCL ? ?Evaluation of current treatment plan related to hypertension self management and patient's adherence to plan as established by provider;   ?Reviewed medications with patient and discussed importance of compliance;  ?Ensured patient had home BP monitor  ?Assessed frequency of home BP monitoring and variance of home BP readings ?Assessed BP readings in provider's office via EMR review ?Discussed plans with patient for ongoing care management follow up and provided patient with direct contact information for care management team; ? ? ?Pre- Diabetes Interventions:  (Status:  New goal.) Long Term Goal ?Assessed patient's understanding of A1c goal:  <6.4% ?Reviewed medications with patient and discussed importance of medication adherence ?Counseled on importance of regular laboratory monitoring as prescribed ?Discussed plans with patient for ongoing care management follow up and provided patient with direct contact information for care management team ?Advised patient, providing education and rationale, to check cbg (every morning -fasting)  and record, calling PCP for findings outside established parameters ?Review of patient status, including review of consultants reports, relevant laboratory and other test results, and medications completed ?Lab Results  ?Component Value Date  ? HGBA1C 6.1 08/22/2020  ?  ? ?Pain Interventions:  (Status:  Goal on track:  NO.) Long Term Goal- patient states her primary pain issues remain related to  carpal tunnel syndrome pain in right hand and wrist; and pain in left foot from previous surgery; states she is adherent with wearing right wrist brace and taking Mobic and gabapentin as prescribed ?Medications reviewed ?Reviewed provider established plan for pain management ?Discussed importance of adherence to all scheduled medical appointments ?Counseled on the importance of reporting any/all new or changed pain symptoms or management strategies to pain management provider ?Advised patient to report to care team affect of pain on daily activities ?Advised patient this RNCM will message provider about   increasing gabapentin dose again at hs to treat right wrist and hand pain ? ?Patient Goals/Self-Care Activities: ?Patient will self administer medications as prescribed ?Patient will attend all scheduled provider appointments ?Patient will call pharmacy for medication refills ?Patient will continue to perform ADL's independently ?Patient will continue to perform IADL's independently ?Patient will call provider office for new concerns or questions ?  ?  ?  ?

## 2022-02-17 NOTE — Patient Outreach (Signed)
Medicaid Managed Care   Nurse Care Manager Note  02/17/2022 Name:  Sandra Lozano MRN:  [id number] DOB:  1961-06-23  Sandra Lozano is an 61 y.o. year old female who is a primary patient of Sandra Lozano, Sandra Wert, DO.  The [facility] Managed Care Coordination team was consulted for assistance with:    HTN Chronic Pain Prediabetes  Sandra Lozano was given information about Medicaid Managed Care Coordination team services today. Sandra Lozano Patient agreed to services and verbal consent obtained.  Engaged with patient by telephone for follow up visit in response to provider referral for case management and/or care coordination services.   Assessments/Interventions:  Review of past medical history, allergies, medications, health status, including review of consultants reports, laboratory and other test data, was performed as part of comprehensive evaluation and provision of chronic care management services.  SDOH (Social Determinants of Health) assessments and interventions performed:   Care Plan  No Known Allergies  Medications Reviewed Today     Reviewed by Barrington Ellison, RN (Registered Nurse) on 02/17/22 at 88  Med List Status: <None>   Medication Order Taking? Sig Documenting Provider Last Dose Status Informant  Accu-Chek Softclix Lancets lancets [id number] No Use as instructed Sandra Lozano, Sandra Plummer, DO Taking Active   atorvastatin (LIPITOR) 20 MG tablet [id number] No Take 1 tablet (20 mg total) by mouth every evening. Sandra Lozano, Sandra Plummer, DO Taking Active   Blood Glucose Monitoring Suppl w/Device KIT [id number] No 1 kit by Does not apply route daily. [name], DO Taking Active   Blood Pressure Monitoring (BLOOD PRESSURE CUFF) MISC [id number] No Check your blood pressure in the morning when you wake up, and at night before you go to bed Sandra Lozano, Sandra Plummer, DO Taking Active   gabapentin (NEURONTIN) 600 MG tablet [id number] No Take 1 tablet (600 mg total) by mouth 3 (three) times daily.  [name], DO Taking Active            Med Note Sandra Lozano, Sandra Lozano Feb 11, 2022  8:59 AM) Patient taking 3 tablets at night  glucose blood test strip [id number] No Please use to check blood sugar once daily. Sandra Lozano, Sandra Plummer, DO Taking Active   lisinopril-hydrochlorothiazide (ZESTORETIC) 20-12.5 MG tablet [id number] No Take 2 tablets by mouth daily. Sandra Lozano, Sandra Plummer, DO Taking Active   meloxicam (MOBIC) 15 MG tablet [id number] No Take 1 tablet (15 mg total) by mouth daily. Sandra Lozano, Sandra Plummer, DO Taking Active   metFORMIN (GLUCOPHAGE) 500 MG tablet [id number] No Take 1 tablet (500 mg total) by mouth daily with breakfast.  Patient taking differently: Take 500 mg by mouth 2 (two) times daily with a meal.   Sandra Lozano, Sandra Plummer, DO Taking Active Self           Med Note Sandra Lozano, [name]   Tue Feb 11, 2022  9:00 AM) Patient takes one tablet at night  Omega-3 Fatty Acids (FISH OIL) 1000 MG CAPS [id number] No Take 1 capsule (1,000 mg total) by mouth at bedtime.  Patient not taking: Reported on 02/11/2022   [name], DO Not Taking Active   triamcinolone ointment (KENALOG) 0.1 % [id number] No Apply 1 application topically 2 (two) times daily. Apply to scalp. Don't use more than 2 weeks in a row without physician approval. [name], DO Taking Active               Care Plan : [place]  Updates made by Barrington Ellison, RN since 02/17/2022 12:00 AM  Problem: Chronic Disease Management and Care Coordination Needs realted to HTN, prediabetes, and chronic pain   Priority: High     Long-Range Goal: Development of Plan Of Care For Chronic Disease Management and Care Coordination Needs To Assist With Meeting Treatment Goals for HTN, prediabetes and chronic pain   Start Date: 08/05/2021  Expected End Date: 08/05/2022  Priority: High  Note:   Current Barriers:  Chronic Disease Management support and education needs related to HTN, prediabetes and chronic  pain- patient says she saw primary care provider on 02/05/22 and issues related securing nebulizer machine to treat her intermittent but chronic dyspnea were discussed. She had PFTS and spirometry and the results did not indicate the need for a nebulizer machine as she has mild pulmonary obstrutive disease. 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 She says the increase in Gabapentin dose has not effectively treated her pain. She says she will see her orthopedist on 3/8 23 to address pain in her left foot.   RNCM Clinical Goal(s):  Patient will verbalize understanding of plan for management of HTN , prediabetes and chronic pain through collaboration with RN Care manager, provider, and care team.   Interventions: Inter-disciplinary care team collaboration (see longitudinal plan of care) Evaluation of current treatment plan related to  self management and patient's adherence to plan as established by provider   Hypertension: (Status: Goal on track: NO.)Long Term- per patient >75% of BP readings at home, she attributes higher readings at provider's office due to pain Last practice recorded BP readings:  BP Readings from Last 3 Encounters:  02/14/22 (!) 160/91  02/05/22 (!) 142/81  12/20/21 126/80     Most recent eGFR/CrCl:  Lab Results  Component Value Date   EGFR 100 05/10/2021    No components found for: CRCL  Evaluation of current treatment plan related to hypertension self management and patient's adherence to plan as established by provider;   Reviewed medications with patient and discussed importance of compliance;  Ensured patient had home BP monitor  Assessed frequency of home BP monitoring and variance of home BP readings Assessed BP readings in provider's office via EMR review Discussed plans with  patient for ongoing care management follow up and provided patient with direct contact information for care management team;   Pre- Diabetes Interventions:  (Status:  Sandra goal.) Long Term Goal Assessed patient's understanding of A1c goal:  <6.4% Reviewed medications with patient and discussed importance of medication adherence Counseled on importance of regular laboratory monitoring as prescribed Discussed plans with patient for ongoing care management follow up and provided patient with direct contact information for care management team Advised patient, providing education and rationale, to check cbg (every morning -fasting)  and record, calling PCP for findings outside established parameters Review of patient status, including review of consultants reports, relevant laboratory and other test results, and medications completed Lab Results  Component Value Date   HGBA1C 6.1 08/22/2020     Pain Interventions:  (Status:  Goal on track:  NO.) Long Term Goal- patient states her primary pain issues remain related to carpal tunnel syndrome pain in right hand and wrist; and pain in left foot from previous surgery; states she is adherent with wearing right wrist brace and taking Mobic and gabapentin as prescribed Medications reviewed  Reviewed provider established plan for pain management Discussed importance of adherence to all scheduled medical appointments Counseled on the importance of reporting any/all Sandra or changed pain symptoms or management strategies to pain management provider Advised patient to report to care team affect of pain on daily activities Advised patient this RNCM will message provider about   increasing gabapentin dose again at hs to treat right wrist and hand pain  Patient Goals/Self-Care Activities: Patient will self administer medications as prescribed Patient will attend all scheduled provider appointments Patient will call pharmacy for medication refills Patient will  continue to perform ADL's independently Patient will continue to perform IADL's independently Patient will call provider office for Sandra concerns or questions       Follow Up:  Patient agrees to Care Plan and Follow-up.  Plan: The Managed Medicaid care management team will reach out to the patient again over the next 30 days.  Date/time of next scheduled RN care management/care coordination outreach:  03/18/22 at 1:00 pm  Kelli Churn RN, CCM, [place] Management Coordinator - Managed [place] High Risk [phone]

## 2022-02-18 ENCOUNTER — Other Ambulatory Visit: Payer: Medicaid Other | Admitting: *Deleted

## 2022-02-18 NOTE — Patient Instructions (Signed)
Visit Information ? ?Ms. Perona was given information about Medicaid Managed Care team care coordination services as a part of their [facility] Medicaid benefit. Latriece Anstine verbally consented to engagement with the [facility] Managed Care team.  ? ?If you are experiencing a medical emergency, please call 911 or report to your local emergency department or urgent care.  ? ?If you have a non-emergency medical problem during routine business hours, please contact your provider's office and ask to speak with a nurse.  ? ?For questions related to your [facility] health plan, please call: [phone] or go here:[email or web address] ? ?If you would like to schedule transportation through your [facility] plan, please call the following number at least 2 days in advance of your appointment: [phone]. ? You can also use the MTM portal or MTM mobile app to manage your rides. For the portal, please go to [email or web address]. ? ?Call the [place] at [phone], at any time, 24 hours a day, 7 days a week. If you are in danger or need immediate medical attention call 911. ? ?If you would like help to quit smoking, call 1-800-QUIT-NOW 660 780 8657) OR Espa?ol: 1-855-D?jelo-Ya 267-280-4157) o para m?s informaci?n haga clic aqu? or Text READY to 200-400 to register via text ? ?Ms. Kruer - following are the goals we discussed in your visit today:  ? Goals Addressed   ?Patient will self administer medications as prescribed ?Patient will attend all scheduled provider appointments ?Patient will call pharmacy for medication refills ?Patient will continue to perform ADL's independently ?Patient will continue to perform IADL's independently ?Patient will call provider office for new concerns or questions  ? ? ? ?Patient verbalizes understanding of instructions and care plan provided today and agrees to view in [place]. Active MyChart status confirmed with patient.   ?30 ?The Managed Medicaid  care management team will reach out to the patient again over the next 30 days.  ? ?Kelli Churn RN, CCM, CDCES ?[facility] ?Care Management Coordinator - Managed Medicaid High Risk ?[phone]  ? ?Following is a copy of your plan of care:  ?Care Plan : [place]  ?Updates made by Barrington Ellison, RN since 02/18/2022 12:00 AM  ?  ? ?Problem: Chronic Disease Management and Care Coordination Needs realted to HTN, prediabetes, and chronic pain   ?Priority: High  ?  ? ?Long-Range Goal: Development of Plan Of Care For Chronic Disease Management and Care Coordination Needs To Assist With Meeting Treatment Goals for HTN, prediabetes and chronic pain   ?Start Date: 08/05/2021  ?Expected End Date: 08/05/2022  ?Priority: High  ?Note:   ?Current Barriers:  ?Chronic Disease Management support and education needs related to HTN, prediabetes and chronic pain- patient says she saw primary care provider on 02/05/22 and issues related securing nebulizer machine to treat her intermittent but chronic dyspnea were discussed. She had PFTS and spirometry and the results did not indicate the need for a nebulizer machine as she has mild pulmonary obstructive disease. 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 She says the increase in Gabapentin dose has not effectively treated her pain. She says she will see her orthopedist on 3/8 23 to address pain in her left foot.  ? ?RNCM Clinical Goal(s):  ?Patient will  verbalize understanding of plan for management of HTN , prediabetes and chronic pain through collaboration with RN Care manager, provider, and care team.  ? ?Interventions: ?Inter-disciplinary care team collaboration (see longitudinal plan of care) ?Evaluation of current treatment plan related to  self  management and patient's adherence to plan as established by provider ? ? ?Hypertension: (Status: Goal on track: NO.)Long Term- per patient >75% of BP readings at home, she attributes higher readings at provider's office due to pain ?Last practice recorded BP readings:  ?BP Readings from Last 3 Encounters:  ?02/14/22 (!) 160/91  ?02/05/22 (!) 142/81  ?12/20/21 126/80  ?   ?Most recent eGFR/CrCl:  ?Lab Results  ?Component Value Date  ? EGFR 100 05/10/2021  ?  No components found for: CRCL ? ?Evaluation of current treatment plan related to hypertension self management and patient's adherence to plan as established by provider;   ?Reviewed medications with patient and discussed importance of compliance;  ?Ensured patient had home BP monitor  ?Assessed frequency of home BP monitoring and variance of home BP readings ?Assessed BP readings in provider's office via EMR review ?Discussed plans with patient for ongoing care management follow up and provided patient with direct contact information for care management team; ? ? ?Pre- Diabetes Interventions:  (Status:  New goal.) Long Term Goal ?Assessed patient's understanding of A1c goal:  <6.4% ?Reviewed medications with patient and discussed importance of medication adherence ?Counseled on importance of regular laboratory monitoring as prescribed ?Discussed plans with patient for ongoing care management follow up and provided patient with direct contact information for care management team ?Advised patient, providing education and rationale, to check cbg (every morning -fasting)  and record, calling PCP for findings outside established parameters ?Review of patient status, including review of consultants reports, relevant laboratory and other test results, and medications completed ?Lab Results  ?Component Value Date  ? HGBA1C 6.1 08/22/2020  ?  ? ?Pain Interventions:  (Status:  Goal on track:  NO.) Long Term Goal- patient states her primary pain issues remain related to  carpal tunnel syndrome pain in right hand and wrist; and pain in left foot from previous surgery; states she is adherent with wearing right wrist brace and taking Mobic and gabapentin as prescribed ?02/18/22 patient left message for this [facility] stating her pain in her hand is really bad so she is going to take 2 gabapentin tablets in the morning and 2 at hs, she says she will start OP PT on 3/13 to treat hand pain ?Medications reviewed ?Reviewed provider established plan for pain management ?Discussed importance of adherence to all scheduled medical appointments ?Counseled on the importance of reporting any/all new or changed pain symptoms or management strategies to pain management provider ?Advised patient to report to care team affect of pain on daily activities ?Advised patient of Dr. Erskine Speed- Lotts response to this RNCM on 02/18/22 regarding increasing Gabapentin dose stating she has referred patient to OP PT to manage hand pain  ?Messaged Dr Erskine Speed -Lotts regarding patient's voice mail stating how she will be taking Gabapentin ( 2 tablets in morning and at hs)  ? ?Patient Goals/Self-Care Activities: ?Patient will self administer medications as prescribed ?Patient will attend all scheduled provider appointments ?Patient will call pharmacy for medication refills ?Patient will continue to perform ADL's independently ?Patient will continue to perform IADL's independently ?Patient will call provider office for new concerns or questions ?  ?  ?  ?

## 2022-02-18 NOTE — Patient Outreach (Signed)
Medicaid Managed Care   Nurse Care Manager Note  02/18/2022 Name:  Sandra Lozano MRN:  [id number] DOB:  10/09/1961  Sandra Lozano is an 61 y.o. year old female who is a primary patient of [facility], [place], DO.  The [facility] Managed Care Coordination team was consulted for assistance with:    HTN HLD Chronic Pain  [name] was given information about Medicaid Managed Care Coordination team services today. [name] Patient agreed to services and verbal consent obtained.  Engaged with patient by telephone for follow up visit in response to provider referral for case management and/or care coordination services.   Assessments/Interventions:  Review of past medical history, allergies, medications, health status, including review of consultants reports, laboratory and other test data, was performed as part of comprehensive evaluation and provision of chronic care management services.  SDOH (Social Determinants of Health) assessments and interventions performed:   Care Plan  No Known Allergies  Medications Reviewed Today     Reviewed by Barrington Ellison, RN (Registered Nurse) on 02/18/22 at 83  Med List Status: <None>   Medication Order Taking? Sig Documenting Provider Last Dose Status Informant  Accu-Chek Softclix Lancets lancets [id number]  Use as instructed Autry-Lott, Naaman Plummer, DO  Active   atorvastatin (LIPITOR) 20 MG tablet [id number]  Take 1 tablet (20 mg total) by mouth every evening. Autry-Lott, Naaman Plummer, DO  Active   Blood Glucose Monitoring Suppl w/Device KIT [id number]  1 kit by Does not apply route daily. Autry-Lott, Naaman Plummer, DO  Active   Blood Pressure Monitoring (BLOOD PRESSURE CUFF) MISC [id number]  Check your blood pressure in the morning when you wake up, and at night before you go to bed Autry-Lott, Naaman Plummer, DO  Active   gabapentin (NEURONTIN) 600 MG tablet [id number] Yes Take 1 tablet (600 mg total) by mouth 3 (three) times daily. [name], DO Taking Active             Med Note Broadus John, Trude Mcburney   Tue Feb 18, 2022  3:22 PM) Patient states she is going to try taking two 600 mg in the morning and two 600 mg tablets at hs to see if her pain is better managed.   glucose blood test strip [id number]  Please use to check blood sugar once daily. Autry-Lott, Naaman Plummer, DO  Active   lisinopril-hydrochlorothiazide (ZESTORETIC) 20-12.5 MG tablet [id number]  Take 2 tablets by mouth daily. Autry-Lott, Naaman Plummer, DO  Active   meloxicam (MOBIC) 15 MG tablet [id number]  Take 1 tablet (15 mg total) by mouth daily. Autry-Lott, Naaman Plummer, DO  Active   metFORMIN (GLUCOPHAGE) 500 MG tablet [id number]  Take 1 tablet (500 mg total) by mouth daily with breakfast.  Patient taking differently: Take 500 mg by mouth 2 (two) times daily with a meal.   Autry-Lott, Naaman Plummer, DO  Active Self           Med Note [name], [name]   Tue Feb 11, 2022  9:00 AM) Patient takes one tablet at night  Omega-3 Fatty Acids (FISH OIL) 1000 MG CAPS [id number]  Take 1 capsule (1,000 mg total) by mouth at bedtime.  Patient not taking: Reported on 02/11/2022   [name], DO  Active   triamcinolone ointment (KENALOG) 0.1 % [id number]  Apply 1 application topically 2 (two) times daily. Apply to scalp. Don't use more than 2 weeks in a row without physician approval. 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   Lumbar stenosis 05/20/2019   Acute left-sided low back pain without sciatica 07/18/2018   Osteoarthritis of right hip 06/03/2018   Chronic right shoulder pain  05/05/2018   Right hip pain 05/05/2018   Rupture of anterior cruciate ligament of right knee 02/15/2018   Sprain of medial collateral ligament of right knee 02/15/2018   Essential hypertension 02/15/2018    Care Plan : RN Care Manager Plan Of Care  Updates made by Barrington Ellison, RN since 02/18/2022 12:00 AM     Problem: Chronic Disease Management and Care Coordination Needs realted to HTN, prediabetes, and chronic pain   Priority: High     Long-Range Goal: Development of Plan Of Care For Chronic Disease Management and Care Coordination Needs To Assist With Meeting Treatment Goals for HTN, prediabetes and chronic pain   Start Date: 08/05/2021  Expected End Date: 08/05/2022  Priority: High  Note:   Current Barriers:  Chronic Disease Management support and education needs related to HTN, prediabetes and chronic pain- patient says she saw primary care provider on 02/05/22 and issues related securing nebulizer machine to treat her intermittent but chronic dyspnea were discussed. She had PFTS and spirometry and the results did not indicate the need for a nebulizer machine as she has mild pulmonary obstructive disease. 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 She says the increase in Gabapentin dose has not effectively treated her pain. She says she will see her orthopedist on 3/8 23 to address pain in her left foot.   RNCM Clinical Goal(s):  Patient will verbalize understanding of plan for management of HTN , prediabetes and chronic pain through collaboration with RN Care manager, provider, and care team.   Interventions: Inter-disciplinary care team collaboration (see longitudinal plan of care) Evaluation of current treatment plan related to  self management and patient's adherence to plan as established by  provider   Hypertension: (Status: Goal on track: NO.)Long Term- per patient >75% of BP readings at home, she attributes higher readings at provider's office due to pain Last practice recorded BP readings:  BP Readings from Last 3 Encounters:  02/14/22 (!) 160/91  02/05/22 (!) 142/81  12/20/21 126/80     Most recent eGFR/CrCl:  Lab Results  Component Value Date   EGFR 100 05/10/2021    No components found for: CRCL  Evaluation of current treatment plan related to hypertension self management and patient's adherence to plan as established by provider;   Reviewed medications with patient and discussed importance of compliance;  Ensured patient had home BP monitor  Assessed frequency of home BP monitoring and variance of home BP readings Assessed BP readings in provider's office via EMR review Discussed plans with patient for ongoing care management follow up and provided patient with direct contact information for care management team;   Pre-  Diabetes Interventions:  (Status:  New goal.) Long Term Goal Assessed patient's understanding of A1c goal:  <6.4% Reviewed medications with patient and discussed importance of medication adherence Counseled on importance of regular laboratory monitoring as prescribed Discussed plans with patient for ongoing care management follow up and provided patient with direct contact information for care management team Advised patient, providing education and rationale, to check cbg (every morning -fasting)  and record, calling PCP for findings outside established parameters Review of patient status, including review of consultants reports, relevant laboratory and other test results, and medications completed Lab Results  Component Value Date   HGBA1C 6.1 08/22/2020     Pain Interventions:  (Status:  Goal on track:  NO.) Long Term Goal- patient states her primary pain issues remain related to carpal tunnel syndrome pain in right hand and wrist; and pain  in left foot from previous surgery; states she is adherent with wearing right wrist brace and taking Mobic and gabapentin as prescribed 02/18/22 patient left message for this [facility] stating her pain in her hand is really bad so she is going to take 2 gabapentin tablets in the morning and 2 at hs, she says she will start OP PT on 3/13 to treat hand pain Medications reviewed Reviewed provider established plan for pain management Discussed importance of adherence to all scheduled medical appointments Counseled on the importance of reporting any/all new or changed pain symptoms or management strategies to pain management provider Advised patient to report to care team affect of pain on daily activities Advised patient of Dr. Erskine Speed- Lotts response to this RNCM on 02/18/22 regarding increasing Gabapentin dose stating she has referred patient to OP PT to manage hand pain  Messaged Dr Erskine Speed -Lotts regarding patient's voice mail stating how she will be taking Gabapentin ( 2 tablets in morning and at hs)   Patient Goals/Self-Care Activities: Patient will self administer medications as prescribed Patient will attend all scheduled provider appointments Patient will call pharmacy for medication refills Patient will continue to perform ADL's independently Patient will continue to perform IADL's independently Patient will call provider office for new concerns or questions       Follow Up:  Patient agrees to Care Plan and Follow-up.  Plan: The Managed Medicaid care management team will reach out to the patient again over the next 30 days.  Date/time of next scheduled RN care management/care coordination outreach:  03/18/22 at 1:00 pm  Kelli Churn RN, CCM, [place] Management Coordinator - Managed [place] High Risk [phone]

## 2022-02-19 ENCOUNTER — Ambulatory Visit (INDEPENDENT_AMBULATORY_CARE_PROVIDER_SITE_OTHER): Payer: Medicaid Other | Admitting: Family

## 2022-02-19 ENCOUNTER — Ambulatory Visit (INDEPENDENT_AMBULATORY_CARE_PROVIDER_SITE_OTHER): Payer: Medicaid Other

## 2022-02-19 DIAGNOSIS — M79672 Pain in left foot: Secondary | ICD-10-CM | POA: Diagnosis not present

## 2022-02-19 DIAGNOSIS — G5762 Lesion of plantar nerve, left lower limb: Secondary | ICD-10-CM | POA: Diagnosis not present

## 2022-02-19 MED ORDER — METHYLPREDNISOLONE ACETATE 40 MG/ML IJ SUSP
20.0000 mg | INTRAMUSCULAR | Status: AC | PRN
  Administered 2022-02-19: 20 mg

## 2022-02-19 MED ORDER — LIDOCAINE HCL 1 % IJ SOLN
1.0000 mL | INTRAMUSCULAR | Status: AC | PRN
  Administered 2022-02-19: 1 mL

## 2022-02-19 NOTE — Progress Notes (Signed)
? ?Office Visit Note ?  ?Patient: Sandra Lozano           ?Date of Birth: 09-09-1961           ?MRN: [id number] ?Visit Date: 02/19/2022 ?             ?Requested by: Lavonda Jumbo, DO ?[address] ?[place],  [place] [address] ?PCP: Lavonda Jumbo, DO ? ?Chief Complaint  ?Patient presents with  ?? Left Foot - Pain  ? ? ? ? ?HPI: ?The patient is a 61 year old woman today for a new complaint of left foot pain. Having pain along lateral foot and beneath the ball of her foot with ambulation. Associated with numbness, especially after long walking.  ? ?Has tried ibu and arch supports with modest relief. ? ?No injury ? ?S/p midfoot fusion of base of 1st and second MTs on 11/21 ? ?Assessment & Plan: ?Visit Diagnoses:  ?1. Pain in left foot   ? ? ?Plan: mortons neuroma, injection today. Patient tolerated well. Discussed heel cord stretching and arch supports, she would like to try over the counter arch supports.  ? ?Follow-Up Instructions: No follow-ups on file.  ? ?Ortho Exam ? ?Patient is alert, oriented, no adenopathy, well-dressed, normal affect, normal respiratory effort. ?On examination of left foot. + too many toes sign. Tender to base of 5th MT. No edema. No erythema. Pain with compression of MT heads and palpation of 3rd webspace. ? ?Imaging: ?XR Foot Complete Left ? ?Result Date: 02/19/2022 ?Radiographs of left foot without acute finding. Midfoot fusion hardware stable. Pes planus.   ?No images are attached to the encounter. ? ?Labs: ?Lab Results  ?Component Value Date  ? HGBA1C 6.1 08/22/2020  ? HGBA1C 6.3 11/01/2019  ? ESRSEDRATE 11 11/26/2021  ? LABURIC 5.2 11/26/2021  ? ? ? ?Lab Results  ?Component Value Date  ? ALBUMIN 4.0 05/18/2019  ? ? ?No results found for: MG ?No results found for: VD25OH ? ?No results found for: PREALBUMIN ?CBC EXTENDED Latest Ref Rng & Units 11/02/2020 05/21/2019 05/18/2019  ?WBC 4.0 - 10.5 K/uL - 11.4(H) 10.5  ?RBC 3.87 - 5.11 MIL/uL - 3.35(L) 4.78  ?HGB 12.0 - 15.0 g/dL 41.9 6.2(I)  29.7  ?HCT 36.0 - 46.0 % 43.0 28.1(L) 41.2  ?PLT 150 - 400 K/uL - 284 356  ? ? ? ?There is no height or weight on file to calculate BMI. ? ?Orders:  ?Orders Placed This Encounter  ?Procedures  ?? XR Foot Complete Left  ? ?No orders of the defined types were placed in this encounter. ? ? ? Procedures: ?Foot Inj ? ?Date/Time: 02/19/2022 2:48 PM ?Performed by: Adonis Huguenin, NP ?Authorized by: Adonis Huguenin, NP  ? ?Condition: Morton's Neuroma   ?Location:  L foot ?Needle Size:  22 G ?Approach:  Dorsal ?Medications:  1 mL lidocaine 1 %; 20 mg methylPREDNISolone acetate 40 MG/ML ?Patient Tolerance:  Patient tolerated the procedure well with no immediate complications ? ?Clinical Data: ?No additional findings. ? ?ROS: ? ?All other systems negative, except as noted in the HPI. ?Review of Systems ? ?Objective: ?Vital Signs: LMP  (LMP Unknown)  ? ?Specialty Comments:  ?No specialty comments available. ? ?PMFS History: ?Patient Active Problem List  ? Diagnosis Date Noted  ?? COPD, mild ([facility]) 02/11/2022  ?? Bilateral carpal tunnel syndrome 12/21/2021  ?? Arthritis of finger of left hand 11/26/2021  ?? Chest wall pain 04/20/2021  ?? Hot flashes 04/20/2021  ?? Traumatic arthritis of left foot   ??  Skin lesion of scalp 06/04/2020  ?? Arthritis of midfoot 03/14/2020  ?? History of lumbar fusion 03/14/2020  ?? Neuropathy 11/07/2019  ?? Pre-diabetes 11/07/2019  ?? Nummular eczema 07/06/2019  ?? Lumbar stenosis 05/20/2019  ?? Acute left-sided low back pain without sciatica 07/18/2018  ?? Osteoarthritis of right hip 06/03/2018  ?? Chronic right shoulder pain 05/05/2018  ?? Right hip pain 05/05/2018  ?? Rupture of anterior cruciate ligament of right knee 02/15/2018  ?? Sprain of medial collateral ligament of right knee 02/15/2018  ?? Essential hypertension 02/15/2018  ? ?Past Medical History:  ?Diagnosis Date  ?? Arthritis   ? right knee, lower back  ?? Dyspnea   ? very rare -tx with albuterol neb sol if needed  ?? Hyperlipidemia   ??  Hypertension   ?? Pre-diabetes   ? per patient - does not check blood sugar  ?? SVD (spontaneous vaginal delivery)   ? x 5 - only 2 living, 1 stillborn and 1 premature at 7 months demise,1 child deceased  ?? Wears dentures   ? upper and lower  ?  ?Family History  ?Problem Relation Age of Onset  ?? Cancer Sister   ?? Healthy Daughter   ?? Healthy Daughter   ?  ?Past Surgical History:  ?Procedure Laterality Date  ?? FOOT ARTHRODESIS Left 11/02/2020  ? Procedure: LEFT MIDFOOT FUSION BASE 1ST AND 2ND METATARSAL;  Surgeon: Nadara Mustard, MD;  Location: [facility] OR;  Service: Orthopedics;  Laterality: Left;  ?? LUMBAR FUSION  05/20/2019  ? GILL PROCEDURE, LEFT TRANSFORAMINAL LUMBAR INTERBODY FUSION, PEDICLE INSTRUMENTATION, BILATERAL FUSION (N/A  ?? TUBAL LIGATION    ? ?Social History  ? ?Occupational History  ?? Not on file  ?Tobacco Use  ?? Smoking status: Former  ?  Packs/day: 1.00  ?  Years: 30.00  ?  Pack years: 30.00  ?  Types: Cigarettes  ?  Quit date: 01/2015  ?  Years since quitting: 7.1  ?  Passive exposure: Past  ?? Smokeless tobacco: Never  ?Vaping Use  ?? Vaping Use: Never used  ?Substance and Sexual Activity  ?? Alcohol use: Not Currently  ?? Drug use: Not Currently  ?  Types: Marijuana  ?  Comment: stopped 12/15/2021  ?? Sexual activity: Not on file  ? ? ? ? ? ?

## 2022-02-21 ENCOUNTER — Telehealth: Payer: Self-pay | Admitting: Physical Therapy

## 2022-02-21 NOTE — Telephone Encounter (Signed)
Good afternoon, ?We received a referral for [name] for PT; however, there is also occupational therapy (OT) attached to the Epic referral.  At our clinic, this patient may be best served by our occupational therapist (OT).  Could you please resend the referral as OT to address the bilateral UE arthritis? ? ?Thank you. ? ?Lonia Blood, PT ?02/21/22 12:06 PM ?Phone: [phone] ?Fax: [phone]  ?

## 2022-02-21 NOTE — Telephone Encounter (Signed)
OK to send a referral for occupational therapy.

## 2022-02-24 ENCOUNTER — Other Ambulatory Visit: Payer: Self-pay | Admitting: *Deleted

## 2022-02-24 ENCOUNTER — Ambulatory Visit: Payer: Medicaid Other | Attending: Rheumatology | Admitting: Occupational Therapy

## 2022-02-24 ENCOUNTER — Other Ambulatory Visit: Payer: Self-pay

## 2022-02-24 ENCOUNTER — Ambulatory Visit: Payer: Medicaid Other | Admitting: Physical Therapy

## 2022-02-24 DIAGNOSIS — G5603 Carpal tunnel syndrome, bilateral upper limbs: Secondary | ICD-10-CM

## 2022-02-24 DIAGNOSIS — M79641 Pain in right hand: Secondary | ICD-10-CM | POA: Insufficient documentation

## 2022-02-24 DIAGNOSIS — R2681 Unsteadiness on feet: Secondary | ICD-10-CM | POA: Insufficient documentation

## 2022-02-24 DIAGNOSIS — M79642 Pain in left hand: Secondary | ICD-10-CM | POA: Insufficient documentation

## 2022-02-24 DIAGNOSIS — M79672 Pain in left foot: Secondary | ICD-10-CM

## 2022-02-24 DIAGNOSIS — M5136 Other intervertebral disc degeneration, lumbar region: Secondary | ICD-10-CM

## 2022-02-24 DIAGNOSIS — M6281 Muscle weakness (generalized): Secondary | ICD-10-CM | POA: Diagnosis not present

## 2022-02-24 DIAGNOSIS — M51369 Other intervertebral disc degeneration, lumbar region without mention of lumbar back pain or lower extremity pain: Secondary | ICD-10-CM

## 2022-02-24 DIAGNOSIS — G8929 Other chronic pain: Secondary | ICD-10-CM

## 2022-02-24 DIAGNOSIS — M160 Bilateral primary osteoarthritis of hip: Secondary | ICD-10-CM

## 2022-02-24 NOTE — Telephone Encounter (Signed)
Please see previous message regarding OT referral.

## 2022-02-24 NOTE — Therapy (Unsigned)
[facility] [address], [address] [place], [place], [address] Phone: [phone]   Fax:  [phone]  Occupational Therapy Evaluation  Patient Details  Name: Sandra RiggsConnie Lozano MRN: [id number] Date of Birth: 11/13/1961 Referring Provider (OT): Pollyann Savoyeveshwar, Shaili, MD   Encounter Date: 02/24/2022   OT End of Session - 02/24/22 1440     Visit Number 1    Number of Visits 7    Date for OT Re-Evaluation 04/25/22    Authorization Type [place] [employer]    OT Start Time 1317    OT Stop Time 1405    OT Time Calculation (min) 48 min    Activity Tolerance Patient tolerated treatment well;No increased pain    Behavior During Therapy WFL for tasks assessed/performed             Past Medical History:  Diagnosis Date   Arthritis    right knee, lower back   Dyspnea    very rare -tx with albuterol neb sol if needed   Hyperlipidemia    Hypertension    Pre-diabetes    per patient - does not check blood sugar   SVD (spontaneous vaginal delivery)    x 5 - only 2 living, 1 stillborn and 1 premature at 7 months demise,1 child deceased   Wears dentures    upper and lower    Past Surgical History:  Procedure Laterality Date   FOOT ARTHRODESIS Left 11/02/2020   Procedure: LEFT MIDFOOT FUSION BASE 1ST AND 2ND METATARSAL;  Surgeon: Nadara Mustarduda, Marcus V, MD;  Location: [facility] OR;  Service: Orthopedics;  Laterality: Left;   LUMBAR FUSION  05/20/2019   GILL PROCEDURE, LEFT TRANSFORAMINAL LUMBAR INTERBODY FUSION, PEDICLE INSTRUMENTATION, BILATERAL FUSION (N/A   TUBAL LIGATION      There were no vitals filed for this visit.   Subjective Assessment - 02/24/22 1441     Pertinent History HTN, Chronic Pain, Prediabetes    Limitations R shoulder pain, lumbar spine fusion, L foot pain being followed by Dr Lajoyce Cornersuda    Repetition Increases Symptoms    Patient Stated Goals to have less pain in her hands    Currently in Pain? Yes    Pain Score 6     Pain Location Hand     Pain Orientation Right    Pain Descriptors / Indicators Aching    Aggravating Factors  positioning    Pain Relieving Factors movement               [facility] OT Assessment - 02/24/22 1324       Assessment   Medical Diagnosis Pain in both hands    Referring Provider (OT) Pollyann Savoyeveshwar, Shaili, MD    Onset Date/Surgical Date 02/14/22   order date.  Reports pain ~3 months prior   Hand Dominance Right    Prior Therapy no      Precautions   Precautions Fall      Restrictions   Weight Bearing Restrictions No      Balance Screen   Has the patient fallen in the past 6 months No    Has the patient had a decrease in activity level because of a fear of falling?  Yes    Is the patient reluctant to leave their home because of a fear of falling?  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  Does report pain with handwriting and cutting vegetables.      IADL   Shopping  Takes care of all shopping needs independently    Light Housekeeping Does personal laundry completely;Maintains house alone or with occasional assistance    Meal Prep Plans, prepares and serves adequate meals independently    Training and development officer own vehicle    Medication Management Is responsible for taking medication in correct dosages at correct time    Development worker, community financial matters independently (budgets, writes checks, pays rent, bills goes to bank), collects and keeps track of income      Written Expression   Dominant Hand Right    Handwriting 100% legible   reports handwriting changes as her pain increases with more writing     Vision - History   Baseline Vision Wears glasses all the time      Cognition   Overall Cognitive Status Within Functional Limits for tasks assessed      Sensation   Light Touch Appears Intact      Coordination   9 Hole Peg Test Right;Left    Right 9 Hole Peg Test 23.69    Left 9 Hole Peg Test 24.31    Box and Blocks R: 45 and L: 49      Hand Function   Right Hand Grip (lbs) 28    Right Hand Lateral Pinch 14 lbs    Left Hand Grip (lbs) 52    Left Hand Lateral Pinch 16 lbs                              OT Education - 02/24/22 1555     Education Details Pt educated on purpose of OT, POC, and OT goals.    Person(s) Educated Patient    Methods Explanation    Comprehension Verbalized understanding                        Plan - 02/24/22 1408     Clinical Impression Statement Pt is a 61 y/o female who presents to OP OT due to numbness, pain, and discomfort in her hands and she has noticed that her fingers have been turning.  Pt seen by PCP with diagnosis of ostoarthritis and recommendaiton for pt to wear bilateral carpal tunnel braces at nighttime.  Pt seen by rheumatology with referral  to OT due to pain in B hands. 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 PMHx includes HTN, Chronic Pain, Prediabetes. Pt will benefit from skilled occupational therapy services to address strength and coordination, ROM, pain management, balance, GM/FM control, introduction of compensatory strategies/AE prn, and implementation of an HEP to improve participation and safety during ADLs and work tasks.    OT Occupational Profile and History Detailed Assessment- Review of Records and additional review of physical, cognitive, psychosocial history related to current functional performance    Occupational performance deficits (Please refer to evaluation for details): ADL's;IADL's;Rest and Sleep;Work;Leisure    Body Structure / Function / Physical Skills ADL;Balance;Body mechanics;Coordination;Decreased knowledge of precautions;Decreased knowledge of use of DME;Dexterity;Endurance;FMC;IADL;Mobility;Pain;ROM;Strength;UE functional use    Rehab Potential Good    Clinical Decision Making Limited treatment options, no task modification necessary    Comorbidities Affecting Occupational Performance: May have comorbidities impacting occupational performance    Modification or Assistance to Complete Evaluation  No modification of tasks or assist necessary to complete eval    OT Frequency 1x / week    OT Duration 6 weeks    OT Treatment/Interventions Self-care/ADL training;Cryotherapy;Moist Heat;Ultrasound;Iontophoresis;Paraffin;Fluidtherapy;Therapeutic exercise;Neuromuscular education;Energy conservation;DME and/or AE instruction;Functional Mobility Training;Manual Therapy;Passive range of motion;Splinting;Therapeutic activities;Patient/family education;Balance training    Plan review hand exercises given from rheumatologist, begin education on AE or adaptive techniques to reduce pain with functional tasks - cutting and handwriting, opening containers    Consulted and Agree with Plan of Care  Patient             Patient will benefit from skilled therapeutic intervention in order to improve the following deficits and impairments:   Body Structure / Function / Physical Skills: ADL, Balance, Body mechanics, Coordination, Decreased knowledge of precautions, Decreased knowledge of use of DME, Dexterity, Endurance, FMC, IADL, Mobility, Pain, ROM, Strength, UE functional use       Visit Diagnosis: Pain in right hand  Pain in left hand  Muscle weakness (generalized)  Unsteadiness on feet    Problem List Patient Active Problem List   Diagnosis Date Noted   COPD, mild (HCC) 02/11/2022   Bilateral carpal tunnel syndrome 12/21/2021   Arthritis of finger of left hand 11/26/2021   Chest wall pain 04/20/2021   Hot flashes 04/20/2021   Traumatic arthritis of left foot    Skin lesion of scalp 06/04/2020   Arthritis of midfoot 03/14/2020   History of lumbar fusion 03/14/2020   Neuropathy 11/07/2019   Pre-diabetes 11/07/2019   Nummular eczema 07/06/2019   Lumbar stenosis 05/20/2019   Acute left-sided low back pain without sciatica 07/18/2018   Osteoarthritis of right hip 06/03/2018   Chronic right shoulder pain 05/05/2018   Right hip pain 05/05/2018   Rupture of anterior cruciate ligament of right knee 02/15/2018   Sprain of medial collateral ligament of right knee 02/15/2018   Essential hypertension 02/15/2018    Rosalio Loud, OT 02/24/2022, 3:59 PM  [facility] [address], [address] [place], [place], [address] Phone: [phone]   Fax:  [phone]  Name: Sandra Lozano MRN: [id number] Date of Birth: 1961/05/26

## 2022-03-05 ENCOUNTER — Other Ambulatory Visit: Payer: Self-pay

## 2022-03-05 ENCOUNTER — Encounter: Payer: Self-pay | Admitting: Occupational Therapy

## 2022-03-05 ENCOUNTER — Ambulatory Visit: Payer: Medicaid Other | Admitting: Occupational Therapy

## 2022-03-05 DIAGNOSIS — R2681 Unsteadiness on feet: Secondary | ICD-10-CM | POA: Diagnosis not present

## 2022-03-05 DIAGNOSIS — M79642 Pain in left hand: Secondary | ICD-10-CM | POA: Diagnosis not present

## 2022-03-05 DIAGNOSIS — M6281 Muscle weakness (generalized): Secondary | ICD-10-CM | POA: Diagnosis not present

## 2022-03-05 DIAGNOSIS — M79641 Pain in right hand: Secondary | ICD-10-CM | POA: Diagnosis not present

## 2022-03-05 NOTE — Patient Instructions (Signed)
AROM: Wrist Extension   .  With ____ palm down, bend wrist up. Repeat __15__ times per set.  Do __4-6__ sessions per day.    AROM: Wrist Flexion   With_____ palm up, bend wrist up. Repeat __15__ times per set.  Do _4-6___ sessions per day.   AROM: Forearm Pronation / Supination   With ____ arm in handshake position, slowly rotate palm down until stretch is felt. Relax. Then rotate palm up until stretch is felt. Repeat _15___ times per set. Do _4-6___ sessions per day.  Copyright  VHI. All rights reserved.      

## 2022-03-05 NOTE — Therapy (Signed)
[facility] [facility], [place], [address] ?Phone: [phone]   Fax:  [phone] ? ?Occupational Therapy Treatment ? ?Patient Details  ?Name: Sandra Lozano ?MRN: [id number] ?Date of Birth: [birth year] ?Referring Provider (OT): Bo Merino, MD ? ? ?Encounter Date: 03/05/2022 ? ? OT End of Session - 03/05/22 0933   ? ? Visit Number 2   ? Number of Visits 7   ? Date for OT Re-Evaluation 04/25/22   ? Authorization Type [place] Medicaid [employer]   ? OT Start Time 0930   ? OT Stop Time 1010   ? OT Time Calculation (min) 40 min   ? Activity Tolerance Patient tolerated treatment well;No increased pain   ? Behavior During Therapy [facility] for tasks assessed/performed   ? ?  ?  ? ?  ? ? ?Past Medical History:  ?Diagnosis Date  ? Arthritis   ? right knee, lower back  ? Dyspnea   ? very rare -tx with albuterol neb sol if needed  ? Hyperlipidemia   ? Hypertension   ? Pre-diabetes   ? per patient - does not check blood sugar  ? SVD (spontaneous vaginal delivery)   ? x 5 - only 2 living, 1 stillborn and 1 premature at 7 months demise,1 child deceased  ? Wears dentures   ? upper and lower  ? ? ?Past Surgical History:  ?Procedure Laterality Date  ? FOOT ARTHRODESIS Left 11/02/2020  ? Procedure: LEFT MIDFOOT FUSION BASE 1ST AND 2ND METATARSAL;  Surgeon: Newt Minion, MD;  Location: [facility];  Service: Orthopedics;  Laterality: Left;  ? LUMBAR FUSION  05/20/2019  ? GILL PROCEDURE, LEFT TRANSFORAMINAL LUMBAR INTERBODY FUSION, PEDICLE INSTRUMENTATION, BILATERAL FUSION (N/A  ? TUBAL LIGATION    ? ? ?There were no vitals filed for this visit. ? ? Subjective Assessment - 03/05/22 0932   ? ? Subjective  I have carpal tunnel bad in this hand (right)   ? Pertinent History HTN, Chronic Pain, Prediabetes   ? Limitations R shoulder pain, lumbar spine fusion, L foot pain being followed by Dr Sharol Given   ? Repetition Increases Symptoms   ? Patient Stated Goals to have less pain in her  hands   ? Currently in Pain? Yes   ? Pain Score 3    ? Pain Location Wrist   ? Pain Orientation Right   ? Pain Descriptors / Indicators Aching   ? Pain Type Chronic pain   ? Pain Onset More than a month ago   ? Pain Frequency Constant   ? ?  ?  ? ?  ? ? ? ?Fluidotherapy x 12 minutes for BUE to address pain, swelling, and stiffness. No adverse reactions.  ? ?Reviewed and issued HEP handout for tendon glides, median nerve glides, wrist flexor stretch (arm pronated) ? ?Pt reports wearing wrist cock up splint on RUE at night.  ? ?AROM: Wrist Extension ? ? ?.  ?With _each_ palm down, bend wrist up. ?Repeat __15__ times per set.  Do __4-6__ sessions per day. ? ? ? ?AROM: Wrist Flexion ? ? ?With_each_ palm up, bend wrist up. ?Repeat __15__ times per set.  Do _4-6___ sessions per day. ? ? ?AROM: Forearm Pronation / Supination ? ? ?With _each_ arm in handshake position, slowly rotate palm down until stretch is felt. Relax. Then rotate palm up until stretch is felt. ?Repeat _15___ times per set. Do _4-6___ sessions per day. ? ? ? ?Forearm Gym x 3 revolutions each  directions ? ? ?Reviewed positioning and activity modifications for wrists. Pt instructed to wear brace while using her tablet as based on demonstration puts wrist in flexed position. ? ? ? ? ? ? ? OT Education - 03/05/22 0941   ? ? Education Details reviewed Median Nerve Glides, Tendon Glides, AROM wrist   ? Person(s) Educated Patient   ? Methods Explanation;Handout;Demonstration   ? Comprehension Verbalized understanding;Returned demonstration   ? ?  ?  ? ?  ? ? ? OT Short Term Goals - 03/05/22 0941   ? ?  ? OT SHORT TERM GOAL #1  ? Title Pt will be independent with HEP for hand and wrist strengthening.   ? Baseline Pt does not currently have HEP.   ? Time 4   ? Period Weeks   ? Status On-going   ? Target Date 03/28/22   ?  ? OT SHORT TERM GOAL #2  ? Title Pt will be independent in splint wear and care.   ? Baseline recently begun wearing carpal tunnel splint for  night time on dominant RUE.   ? Time 4   ? Period Weeks   ? Status On-going   ? ?  ?  ? ?  ? ? ? ? OT Long Term Goals - 02/24/22 1559   ? ?  ? OT LONG TERM GOAL #1  ? Title Pt will verbalize understanding of AE and adaptive techniques to increase independence and decrease pain when completing ADL/IADLs   ? Baseline pt reports pain with repetitive movements.   ? Time 8   ? Period Weeks   ? Status New   ? Target Date 04/25/22   ?  ? OT LONG TERM GOAL #2  ? Title Pt will be able to write for 2 mins without pain increasing >2/10 from pain at beginning of task.   ? Baseline Pt reports pain with handwriting   ? Time 8   ? Period Weeks   ? Status New   ?  ? OT LONG TERM GOAL #3  ? Title Pt will demonstrate improved UE functional use for ADLs as evidenced by increasing box/ blocks score by 3 blocks with RUE.   ? Baseline R: 45 and L: 49   ? Time 8   ? Period Weeks   ? Status New   ? ?  ?  ? ?  ? ? ? ? ? ? ? ? Plan - 03/05/22 0942   ? ? Clinical Impression Statement Reviewed goals - pt verbalized understanding.   ? OT Occupational Profile and History Detailed Assessment- Review of Records and additional review of physical, cognitive, psychosocial history related to current functional performance   ? Occupational performance deficits (Please refer to evaluation for details): ADL's;IADL's;Rest and Sleep;Work;Leisure   ? Body Structure / Function / Physical Skills ADL;Balance;Body mechanics;Coordination;Decreased knowledge of precautions;Decreased knowledge of use of DME;Dexterity;Endurance;FMC;IADL;Mobility;Pain;ROM;Strength;UE functional use   ? Rehab Potential Good   ? Clinical Decision Making Limited treatment options, no task modification necessary   ? Comorbidities Affecting Occupational Performance: May have comorbidities impacting occupational performance   ? Modification or Assistance to Complete Evaluation  No modification of tasks or assist necessary to complete eval   ? OT Frequency 1x / week   ? OT Duration 6 weeks    ? OT Treatment/Interventions Self-care/ADL training;Cryotherapy;Moist Heat;Ultrasound;Iontophoresis;Paraffin;Fluidtherapy;Therapeutic exercise;Neuromuscular education;Energy conservation;DME and/or AE instruction;Functional Mobility Training;Manual Therapy;Passive range of motion;Splinting;Therapeutic activities;Patient/family education;Balance training   ? Plan begin education on AE or adaptive techniques  to reduce pain with functional tasks - cutting and handwriting, opening containers   ? Consulted and Agree with Plan of Care Patient   ? ?  ?  ? ?  ? ? ?Patient will benefit from skilled therapeutic intervention in order to improve the following deficits and impairments:   ?Body Structure / Function / Physical Skills: ADL, Balance, Body mechanics, Coordination, Decreased knowledge of precautions, Decreased knowledge of use of DME, Dexterity, Endurance, FMC, IADL, Mobility, Pain, ROM, Strength, UE functional use ?  ?  ? ? ?Visit Diagnosis: ?Pain in right hand ? ?Pain in left hand ? ?Muscle weakness (generalized) ? ?Unsteadiness on feet ? ? ? ?Problem List ?Patient Active Problem List  ? Diagnosis Date Noted  ? COPD, mild ([facility]) 02/11/2022  ? Bilateral carpal tunnel syndrome 12/21/2021  ? Arthritis of finger of left hand 11/26/2021  ? Chest wall pain 04/20/2021  ? Hot flashes 04/20/2021  ? Traumatic arthritis of left foot   ? Skin lesion of scalp 06/04/2020  ? Arthritis of midfoot 03/14/2020  ? History of lumbar fusion 03/14/2020  ? Neuropathy 11/07/2019  ? Pre-diabetes 11/07/2019  ? Nummular eczema 07/06/2019  ? Lumbar stenosis 05/20/2019  ? Acute left-sided low back pain without sciatica 07/18/2018  ? Osteoarthritis of right hip 06/03/2018  ? Chronic right shoulder pain 05/05/2018  ? Right hip pain 05/05/2018  ? Rupture of anterior cruciate ligament of right knee 02/15/2018  ? Sprain of medial collateral ligament of right knee 02/15/2018  ? Essential hypertension 02/15/2018  ? ? ?Zachery Conch,  OT ?03/05/2022, 10:15 AM ? ?[facility] ?[address] ?[place], [place], [address] ?Phone: [phone]   Fax:  [phone] ? ?Name: Sandra Lozano ?MRN: [id number]

## 2022-03-10 ENCOUNTER — Other Ambulatory Visit: Payer: Self-pay | Admitting: Family Medicine

## 2022-03-10 DIAGNOSIS — E785 Hyperlipidemia, unspecified: Secondary | ICD-10-CM

## 2022-03-10 DIAGNOSIS — G5601 Carpal tunnel syndrome, right upper limb: Secondary | ICD-10-CM

## 2022-03-10 DIAGNOSIS — I10 Essential (primary) hypertension: Secondary | ICD-10-CM

## 2022-03-11 MED ORDER — METFORMIN HCL 500 MG PO TABS: 500.0000 mg | ORAL_TABLET | Freq: Every day | ORAL | 0 refills | Status: DC

## 2022-03-11 MED ORDER — GABAPENTIN 600 MG PO TABS: 600.0000 mg | ORAL_TABLET | Freq: Three times a day (TID) | ORAL | 0 refills | Status: DC

## 2022-03-11 MED ORDER — ATORVASTATIN CALCIUM 20 MG PO TABS: 20.0000 mg | ORAL_TABLET | Freq: Every evening | ORAL | 0 refills | Status: DC

## 2022-03-11 MED ORDER — BLOOD PRESSURE CUFF MISC: 0 refills | Status: DC

## 2022-03-11 MED ORDER — TRIAMCINOLONE ACETONIDE 0.1 % EX OINT: 1.0000 "application " | TOPICAL_OINTMENT | Freq: Two times a day (BID) | CUTANEOUS | 0 refills | Status: DC

## 2022-03-12 ENCOUNTER — Other Ambulatory Visit: Payer: Self-pay | Admitting: Family Medicine

## 2022-03-12 ENCOUNTER — Other Ambulatory Visit: Payer: Self-pay

## 2022-03-12 ENCOUNTER — Ambulatory Visit: Payer: Medicaid Other | Admitting: Occupational Therapy

## 2022-03-12 ENCOUNTER — Ambulatory Visit: Payer: Medicaid Other | Admitting: Rheumatology

## 2022-03-12 ENCOUNTER — Encounter: Payer: Self-pay | Admitting: Occupational Therapy

## 2022-03-12 DIAGNOSIS — M6281 Muscle weakness (generalized): Secondary | ICD-10-CM | POA: Diagnosis not present

## 2022-03-12 DIAGNOSIS — M79642 Pain in left hand: Secondary | ICD-10-CM

## 2022-03-12 DIAGNOSIS — R2681 Unsteadiness on feet: Secondary | ICD-10-CM | POA: Diagnosis not present

## 2022-03-12 DIAGNOSIS — M79641 Pain in right hand: Secondary | ICD-10-CM | POA: Diagnosis not present

## 2022-03-12 MED ORDER — METFORMIN HCL 500 MG PO TABS: 500.0000 mg | ORAL_TABLET | Freq: Every day | ORAL | 0 refills | Status: DC

## 2022-03-12 NOTE — Therapy (Signed)
?OUTPATIENT OCCUPATIONAL THERAPY TREATMENT NOTE ? ? ?Patient Name: Sandra Lozano ?MRN: [id number] ?DOB:Mar 07, 1961, 61 y.o., female ?Today's Date: 03/12/2022 ? ?PCP: Lavonda Jumbo, DO ?REFERRING PROVIDER: Lavonda Jumbo, DO ? ? OT End of Session - 03/12/22 9381   ? ? Visit Number 3   ? Number of Visits 7   ? Date for OT Re-Evaluation 04/25/22   ? Authorization Type [place] Medicaid [employer]   ? OT Start Time 0845   ? OT Stop Time 0927   ? OT Time Calculation (min) 42 min   ? Activity Tolerance Patient tolerated treatment well;No increased pain   ? Behavior During Therapy [facility] for tasks assessed/performed   ? ?  ?  ? ?  ? ? ?Past Medical History:  ?Diagnosis Date  ? Arthritis   ? right knee, lower back  ? Dyspnea   ? very rare -tx with albuterol neb sol if needed  ? Hyperlipidemia   ? Hypertension   ? Pre-diabetes   ? per patient - does not check blood sugar  ? SVD (spontaneous vaginal delivery)   ? x 5 - only 2 living, 1 stillborn and 1 premature at 7 months demise,1 child deceased  ? Wears dentures   ? upper and lower  ? ?Past Surgical History:  ?Procedure Laterality Date  ? FOOT ARTHRODESIS Left 11/02/2020  ? Procedure: LEFT MIDFOOT FUSION BASE 1ST AND 2ND METATARSAL;  Surgeon: Nadara Mustard, MD;  Location: [facility] OR;  Service: Orthopedics;  Laterality: Left;  ? LUMBAR FUSION  05/20/2019  ? GILL PROCEDURE, LEFT TRANSFORAMINAL LUMBAR INTERBODY FUSION, PEDICLE INSTRUMENTATION, BILATERAL FUSION (N/A  ? TUBAL LIGATION    ? ?Patient Active Problem List  ? Diagnosis Date Noted  ? COPD, mild (HCC) 02/11/2022  ? Bilateral carpal tunnel syndrome 12/21/2021  ? Arthritis of finger of left hand 11/26/2021  ? Chest wall pain 04/20/2021  ? Hot flashes 04/20/2021  ? Traumatic arthritis of left foot   ? Skin lesion of scalp 06/04/2020  ? Arthritis of midfoot 03/14/2020  ? History of lumbar fusion 03/14/2020  ? Neuropathy 11/07/2019  ? Pre-diabetes 11/07/2019  ? Nummular eczema 07/06/2019  ? Lumbar stenosis 05/20/2019  ? Acute  left-sided low back pain without sciatica 07/18/2018  ? Osteoarthritis of right hip 06/03/2018  ? Chronic right shoulder pain 05/05/2018  ? Right hip pain 05/05/2018  ? Rupture of anterior cruciate ligament of right knee 02/15/2018  ? Sprain of medial collateral ligament of right knee 02/15/2018  ? Essential hypertension 02/15/2018  ? ? ?ONSET DATE: 02/14/22 - referral date. Reports pain ~ 3 months prior ? ?REFERRING DIAG: Pain in both hands ? ?THERAPY DIAG:  ?Pain in right hand ? ?Pain in left hand ? ?Muscle weakness (generalized) ? ?Unsteadiness on feet ? ? ?PERTINENT HISTORY: HTN, Chronic Pain, Prediabetes  ? ?PRECAUTIONS: R shoulder pain, lumbar spine fusion, L foot pain being followed by Dr. Lajoyce Corners  ? ?SUBJECTIVE: "Doing fabulous. It wasn't bothering me that much last night and I have been doing the exercises" ? ?PAIN:  ?Are you having pain? Yes: NPRS scale: 3/10 ?Pain location: BUE wrists ?Pain description: ache ?Aggravating factors: use ?Relieving factors:   ? ? ? ? ?OBJECTIVE:  ? ?TODAY'S TREATMENT:  ? ?03/12/22 ?Fluidotherapy x 12 minutes for BUE to address pain, swelling, and stiffness. No adverse reactions.  ? ?Reviewed AROM exercises from HEP. Independent with HEP. ?   Upgraded to 1 lb strengthening at wrist. ? ?Yellow Theraputty Issued ?1. Grip Strengthening (Resistive Putty) ? ? ?  Squeeze putty using thumb and all fingers. ?Repeat _20___ times. Do __2__ sessions per day. ? ? ?2. Roll putty into tube on table and pinch between each finger and thumb x 10 reps each. (can do ring and small finger together) ? ?Copyright ? VHI. All rights reserved.  ? ?Pronation/Supination Wheel in pain free zones with BUE ? ? ?PATIENT EDUCATION: ?Education details: Yellow Theraputty issued ?Person educated: Patient ?Education method: Explanation and Demonstration ?Education comprehension: verbalized understanding and returned demonstration ? ? ?HOME EXERCISE PROGRAM ?03/05/22 - reviewed Median Nerve Glides, Tendon Glides, AROM  wrist  ?03/12/22 - upgraded to 1 lb strengthening wrist exercises, issued yellow theraputty HEP ? ? OT Short Term Goals - 03/05/22 0941   ? ?  ? OT SHORT TERM GOAL #1  ? Title Pt will be independent with HEP for hand and wrist strengthening.   ? Baseline Pt does not currently have HEP.   ? Time 4   ? Period Weeks   ? Status On-going Independent with AROM HEP, upgrading to strengthening  ? Target Date 03/28/22   ?  ? OT SHORT TERM GOAL #2  ? Title Pt will be independent in splint wear and care.   ? Baseline recently begun wearing carpal tunnel splint for night time on dominant RUE.   ? Time 4   ? Period Weeks   ? Status On-going Independent with current wear and care, continue to assess and upgrade PRN  ? ?  ?  ? ?  ? ? ? OT Long Term Goals - 02/24/22 1559   ? ?  ? OT LONG TERM GOAL #1  ? Title Pt will verbalize understanding of AE and adaptive techniques to increase independence and decrease pain when completing ADL/IADLs   ? Baseline pt reports pain with repetitive movements.   ? Time 8   ? Period Weeks   ? Status New   ? Target Date 04/25/22   ?  ? OT LONG TERM GOAL #2  ? Title Pt will be able to write for 2 mins without pain increasing >2/10 from pain at beginning of task.   ? Baseline Pt reports pain with handwriting   ? Time 8   ? Period Weeks   ? Status New   ?  ? OT LONG TERM GOAL #3  ? Title Pt will demonstrate improved UE functional use for ADLs as evidenced by increasing box/ blocks score by 3 blocks with RUE.   ? Baseline R: 45 and L: 49   ? Time 8   ? Period Weeks   ? Status New   ? ?  ?  ? ?  ? ? ? Plan - 03/12/22 0855   ? ? Clinical Impression Statement Pt reports decrease in pain and doing exercises consistently at home.   ? OT Occupational Profile and History Detailed Assessment- Review of Records and additional review of physical, cognitive, psychosocial history related to current functional performance   ? Occupational performance deficits (Please refer to evaluation for details): ADL's;IADL's;Rest  and Sleep;Work;Leisure   ? Body Structure / Function / Physical Skills ADL;Balance;Body mechanics;Coordination;Decreased knowledge of precautions;Decreased knowledge of use of DME;Dexterity;Endurance;FMC;IADL;Mobility;Pain;ROM;Strength;UE functional use   ? Rehab Potential Good   ? Clinical Decision Making Limited treatment options, no task modification necessary   ? Comorbidities Affecting Occupational Performance: May have comorbidities impacting occupational performance   ? Modification or Assistance to Complete Evaluation  No modification of tasks or assist necessary to complete eval   ? OT  Frequency 1x / week   ? OT Duration 6 weeks   ? OT Treatment/Interventions Self-care/ADL training;Cryotherapy;Moist Heat;Ultrasound;Iontophoresis;Paraffin;Fluidtherapy;Therapeutic exercise;Neuromuscular education;Energy conservation;DME and/or AE instruction;Functional Mobility Training;Manual Therapy;Passive range of motion;Splinting;Therapeutic activities;Patient/family education;Balance training   ? Plan continue with AROM and light strengthening with BUE wrists, A/E and strategies for ADLs   ? Consulted and Agree with Plan of Care Patient   ? ?  ?  ? ?  ? ? ? ?Junious DresserKirstyn M Kensington Rios, OT ?03/12/2022, 9:25 AM ? ?  ? ?  ?

## 2022-03-12 NOTE — Patient Instructions (Signed)
1. Grip Strengthening (Resistive Putty)   Squeeze putty using thumb and all fingers. Repeat _20___ times. Do __2__ sessions per day.   2. Roll putty into tube on table and pinch between each finger and thumb x 10 reps each. (can do ring and small finger together)     Copyright  VHI. All rights reserved.   

## 2022-03-15 DIAGNOSIS — Z419 Encounter for procedure for purposes other than remedying health state, unspecified: Secondary | ICD-10-CM | POA: Diagnosis not present

## 2022-03-18 ENCOUNTER — Other Ambulatory Visit: Payer: Self-pay | Admitting: *Deleted

## 2022-03-18 NOTE — Patient Instructions (Signed)
Visit Information ? ?Sandra Lozano was given information about Medicaid Managed Care team care coordination services as a part of their [facility] Medicaid benefit. Sandra Lozano verbally consented to engagement with the [facility] Managed Care team.  ? ?If you are experiencing a medical emergency, please call 911 or report to your local emergency department or urgent care.  ? ?If you have a non-emergency medical problem during routine business hours, please contact your provider's office and ask to speak with a nurse.  ? ?For questions related to your [facility] health plan, please call: [phone] or go here:[email or web address] ? ?If you would like to schedule transportation through your [facility] plan, please call the following number at least 2 days in advance of your appointment: [phone]. ? You can also use the MTM portal or MTM mobile app to manage your rides. For the portal, please go to [email or web address]. ? ?Call the [place] at [phone], at any time, 24 hours a day, 7 days a week. If you are in danger or need immediate medical attention call 911. ? ?If you would like help to quit smoking, call 1-800-QUIT-NOW (512)650-5953) OR Espa?ol: 1-855-D?jelo-Ya 907-684-6994) o para m?s informaci?n haga clic aqu? or Text READY to 200-400 to register via text ? ?Sandra Lozano - following are the goals we discussed in your visit today:  ? Goals Addressed   ?Patient will self administer medications as prescribed ?Patient will attend all scheduled provider appointments ?Patient will call pharmacy for medication refills ?Patient will continue to perform ADL's independently ?Patient will continue to perform IADL's independently ?Patient will call provider office for new concerns or questions ?Review health information on Morton's Neuralgia and write down any questions to discuss on next [facility] telephone call ? ? ?Please see education materials related to Morton's Neuralgia  provided as print materials.  ? ?The patient verbalized understanding of instructions, educational materials, and care plan provided today and agreed to receive a mailed copy of patient instructions, educational materials, and care plan.  ? ?The Managed Medicaid care management team will reach out to the patient again over the next 30 days.  ? ?Kelli Churn RN, CCM, CDCES ?[facility] ?Care Management Coordinator - Managed Medicaid High Risk ?[phone]  ? ?Following is a copy of your plan of care:  ?Care Plan : [place]  ?Updates made by Barrington Ellison, RN since 03/18/2022 12:00 AM  ?  ? ?Problem: Chronic Disease Management and Care Coordination Needs realted to HTN, prediabetes, and chronic pain   ?Priority: High  ?  ? 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 ?RNCM Clinical Goal(s):  ?Patient will verbalize understanding of plan for management of HTN , prediabetes and chronic pain through collaboration with RN Care manager, provider, and care team.  ? ?Interventions: ?Inter-disciplinary care team collaboration (see longitudinal plan  of care) ?Evaluation of current treatment plan related to  self management and patient's adherence to plan as established by provider ? ? ?Hypertension: (Status: Goal on track: NO.)Long Term- per patient >75% of BP readings at  home, she attributes higher readings at provider's office due to pain ?Last practice recorded BP readings:  ?BP Readings from Last 3 Encounters:  ?02/14/22 (!) 160/91  ?02/05/22 (!) 142/81  ?12/20/21 126/80  ?    ?Most recent eGFR/CrCl:  ?Lab Results  ?Component Value Date  ? EGFR 100 05/10/2021  ?  No components found for: CRCL ? ?Evaluation of current treatment plan related to hypertension self management and patient's adherence to plan as established by provider;   ?Reviewed medications with patient and discussed importance of compliance;  ?Ensured patient had home BP monitor  ?Assessed frequency of home BP monitoring and variance of home BP readings ?Assessed BP readings in provider's office via EMR review ?Discussed plans with patient for ongoing care management follow up and provided patient with direct contact information for care management team; ? ? ?Pre- Diabetes Interventions:  (Status:  New goal. and Goal on track:  Yes.) Long Term Goal ?Assessed patient's understanding of A1c goal:  <6.4% ?Reviewed medications with patient and discussed importance of medication adherence ?Counseled on importance of regular laboratory monitoring as prescribed ?Discussed plans with patient for ongoing care management follow up and provided patient with direct contact information for care management team ?Advised patient, providing education and rationale, to check cbg (every morning -fasting)  and record, calling PCP for findings outside established parameters ?Review of patient status, including review of consultants reports, relevant laboratory and other test results, and medications completed ?Lab Results  ?Component Value Date  ? HGBA1C 6.1 08/22/2020  ?  ?Lab Results  ?Component Value Date  ? CHOL 159 02/05/2022  ? HDL 57 02/05/2022  ? [place] 81 02/05/2022  ? TRIG 117 02/05/2022  ? CHOLHDL 2.8 02/05/2022  ?  ? ?Pain Interventions:  (Status:  Goal on track:  Yes.) Long Term Goal- patient states pain in right hand  related to carpal tunnel syndrome is better with OP OT , says she has reduced the gabapentin to 2 capsules at hs only as she is experiencing alopecia. pain in right hand and wrist; pain in left foot from previous surgery; has improved since receiving steroid injection on 3/8 to treat Morton's neuroma, states she is adherent with wearing right wrist brace and taking Mobic as prescribed, she says she was told at one time that she has flat feet so she ordered arch supports from [employer] to see that if that will help foot pain and correct her gait ?Medications reviewed ?Reviewed provider established plan for pain management ?Discussed importance of adherence to all scheduled medical appointments ?Counseled on the importance of reporting any/all new or changed pain symptoms or management strategies to pain management provider ?Advised patient to report to care team affect of pain on daily activities ?Mailed information on Morton's neuroma to patient's home address ?Provided patient with address and phone number of "[employer]" and suggested she stop by for an assessment of her gait and orthotics ? ? ?Patient Goals/Self-Care Activities: ?Patient will self administer medications as prescribed ?Patient will attend all scheduled provider appointments ?Patient will call pharmacy for medication refills ?Patient will continue to perform ADL's independently ?Patient will continue to perform IADL's independently ?Patient will call provider office for new concerns or questions ?Review health information on Morton's Neuralgia and write down  any questions to discuss on next [facility] telephone call ?  ?  ?  ?

## 2022-03-18 NOTE — Patient Outreach (Signed)
?Medicaid Managed Care   ?Nurse Care Manager Note ? ?03/18/2022 ?Name:  Sandra Lozano MRN:  [id number] DOB:  Nov 15, 1961 ? ?Sandra Lozano is an 61 y.o. year old female who is a primary patient of [place], [place], DO.  The [facility] Managed Care Coordination team was consulted for assistance with:    ?HTN ?HLD ?Prediabetes ?Chronic Pain ? ?Sandra Lozano was given information about Medicaid Managed Care Coordination team services today. Sandra Lozano Patient agreed to services and verbal consent obtained. ? ?Engaged with patient by telephone for follow up visit in response to provider referral for case management and/or care coordination services.  ? ?Assessments/Interventions:  Review of past medical history, allergies, medications, health status, including review of consultants reports, laboratory and other test data, was performed as part of comprehensive evaluation and provision of chronic care management services. ? ?SDOH (Social Determinants of Health) assessments and interventions performed: ? ? ?Care Plan ? ?No Known Allergies ? ?Medications Reviewed Today   ? ? Reviewed by Barrington Ellison, RN (Registered Nurse) on 03/18/22 at 1609  Med List Status: <None>  ? ?Medication Order Taking? Sig Documenting Provider Last Dose Status Informant  ?Accu-Chek Softclix Lancets lancets [id number]  Use as instructed Autry-Lott, Naaman Plummer, DO  Active   ?atorvastatin (LIPITOR) 20 MG tablet [id number] Yes Take 1 tablet (20 mg total) by mouth every evening. [name], DO Taking Active   ?Blood Glucose Monitoring Suppl w/Device KIT [id number]  1 kit by Does not apply route daily. [name], DO  Active   ?Blood Pressure Monitoring (BLOOD PRESSURE CUFF) MISC [id number]  Check your blood pressure in the morning when you wake up, and at night before you go to bed Autry-Lott, Naaman Plummer, DO  Active   ?gabapentin (NEURONTIN) 600 MG tablet [id number] Yes Take 1 tablet (600 mg total) by mouth 3 (three) times daily.  ?Patient taking  differently: Take 1,200 mg by mouth at bedtime.  ? Autry-Lott, Naaman Plummer, DO Taking Active   ?glucose blood test strip [id number]  Please use to check blood sugar once daily. Autry-Lott, Naaman Plummer, DO  Active   ?lisinopril-hydrochlorothiazide (ZESTORETIC) 20-12.5 MG tablet [id number] Yes Take 2 tablets by mouth daily. Autry-Lott, Naaman Plummer, DO Taking Active   ?meloxicam (MOBIC) 15 MG tablet [id number] Yes Take 1 tablet (15 mg total) by mouth daily. Autry-Lott, Naaman Plummer, DO Taking Active   ?metFORMIN (GLUCOPHAGE) 500 MG tablet [id number] Yes Take 1 tablet (500 mg total) by mouth daily with breakfast. Autry-Lott, Naaman Plummer, DO Taking Active   ?Omega-3 Fatty Acids (FISH OIL) 1000 MG CAPS [id number]  Take 1 capsule (1,000 mg total) by mouth at bedtime.  ?Patient not taking: Reported on 02/11/2022  ? Autry-Lott, Naaman Plummer, DO  Active   ?triamcinolone ointment (KENALOG) 0.1 % [id number]  Apply 1 application. topically 2 (two) times daily. Apply to scalp. Don't use more than 2 weeks in a row without physician approval. [name], DO  Active   ? ?  ?  ? ?  ? ? ?Patient Active Problem List  ? Diagnosis Date Noted  ? COPD, mild ([place]) 02/11/2022  ? Bilateral carpal tunnel syndrome 12/21/2021  ? Arthritis of finger of left hand 11/26/2021  ? Chest wall pain 04/20/2021  ? Hot flashes 04/20/2021  ? Traumatic arthritis of left foot   ? Skin lesion of scalp 06/04/2020  ? Arthritis of midfoot 03/14/2020  ? History of lumbar fusion 03/14/2020  ? Neuropathy 11/07/2019  ? Pre-diabetes 11/07/2019  ? Nummular eczema 07/06/2019  ? Lumbar stenosis 05/20/2019  ? Acute  left-sided low back pain without sciatica 07/18/2018  ? Osteoarthritis of right hip 06/03/2018  ? Chronic right shoulder pain 05/05/2018  ? Right hip pain 05/05/2018  ? Rupture of anterior cruciate ligament of right knee 02/15/2018  ? Sprain of medial collateral ligament of right knee 02/15/2018  ? Essential hypertension 02/15/2018  ? ? ? ?Care Plan : RN Care Manager Plan Of Care  ?Updates  made by Barrington Ellison, RN since 03/18/2022 12:00 AM  ?  ? ?Problem: Chronic Disease Management and Care Coordination Needs realted to HTN, prediabetes, and chronic pain   ?Priority: High  ?  ? 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 ?RNCM Clinical Goal(s):  ?Patient will verbalize understanding of plan for management of HTN , prediabetes and chronic pain through collaboration with RN Care manager, provider, and care team.  ? ?Interventions: ?Inter-disciplinary care team collaboration (see longitudinal plan of care) ?Evaluation of current treatment plan related to  self management and patient's adherence to plan as established by provider ? ? ?Hypertension: (Status: Goal on track: NO.)Long Term- per patient >75% of BP readings at home, she attributes higher readings at provider's office due to pain ?Last practice recorded BP readings:  ?BP Readings from Last 3 Encounters:  ?02/14/22 (!) 160/91  ?02/05/22 (!) 142/81  ?12/20/21 126/80  ?    ?Most recent eGFR/CrCl:  ?Lab Results  ?Component Value Date  ? EGFR 100 05/10/2021  ?  No components found for: CRCL ? ?Evaluation of current treatment plan related to hypertension self management and patient's adherence to plan as established by provider;   ?Reviewed medications with patient and discussed importance of compliance;  ?Ensured patient had home BP monitor   ?Assessed frequency of home BP monitoring and variance of home BP readings ?Assessed BP readings in provider's office via EMR review ?Discussed plans with patient for ongoing care management follow up and provided patient with direct contact information for care management team; ? ? ?Pre- Diabetes Interventions:  (Status:  New goal. and Goal on track:  Yes.) Long Term Goal ?Assessed patient's understanding of A1c goal:  <6.4% ?Reviewed medications with patient and discussed importance of medication adherence ?Counseled on importance of regular laboratory monitoring as prescribed ?Discussed plans with patient for ongoing care management follow up and provided patient with direct contact information for care management team ?Advised patient, providing education and rationale, to check cbg (every morning -fasting)  and record, calling PCP for findings outside established parameters ?Review of patient status, including review of consultants reports, relevant laboratory and other test results, and medications completed ?Lab Results  ?Component Value Date  ? HGBA1C 6.1 08/22/2020  ?  ?Lab Results  ?Component Value Date  ? CHOL 159 02/05/2022  ? HDL 57 02/05/2022  ? [place] 81 02/05/2022  ? TRIG 117 02/05/2022  ? CHOLHDL 2.8 02/05/2022  ?  ? ?Pain  Interventions:  (Status:  Goal on track:  Yes.) Long Term Goal- patient states pain in right hand related to carpal tunnel syndrome is better with OP OT , says she has reduced the gabapentin to 2 capsules at hs only as she is experiencing alopecia. pain in right hand and wrist; pain in left foot from previous surgery; has improved since receiving steroid injection on 3/8 to treat Morton's neuroma, states she is adherent with wearing right wrist brace and taking Mobic as prescribed, she says she was told at one time that she has flat feet so she ordered arch supports from [employer] to see that if that will help foot pain and correct her gait ?Medications reviewed ?Reviewed provider  established plan for pain management ?Discussed importance of adherence to all scheduled medical appointments ?Counseled on the importance of reporting any/all new or changed pain symptoms or management strategies

## 2022-03-19 ENCOUNTER — Encounter: Payer: Self-pay | Admitting: Occupational Therapy

## 2022-03-19 ENCOUNTER — Ambulatory Visit: Payer: Medicaid Other | Attending: Rheumatology | Admitting: Occupational Therapy

## 2022-03-19 ENCOUNTER — Other Ambulatory Visit: Payer: Self-pay | Admitting: Family Medicine

## 2022-03-19 DIAGNOSIS — M79641 Pain in right hand: Secondary | ICD-10-CM | POA: Diagnosis not present

## 2022-03-19 DIAGNOSIS — M6281 Muscle weakness (generalized): Secondary | ICD-10-CM | POA: Insufficient documentation

## 2022-03-19 DIAGNOSIS — M79642 Pain in left hand: Secondary | ICD-10-CM | POA: Diagnosis not present

## 2022-03-19 DIAGNOSIS — R2681 Unsteadiness on feet: Secondary | ICD-10-CM | POA: Insufficient documentation

## 2022-03-19 DIAGNOSIS — G5601 Carpal tunnel syndrome, right upper limb: Secondary | ICD-10-CM

## 2022-03-19 NOTE — Therapy (Signed)
?OUTPATIENT OCCUPATIONAL THERAPY TREATMENT NOTE ? ? ?Patient Name: Sandra Lozano ?MRN: [id number] ?DOB:1961-06-11, 61 y.o., female ?Today's Date: 03/19/2022 ? ?PCP: Lavonda Jumbo, DO ?REFERRING PROVIDER: Lavonda Jumbo, DO ? ? OT End of Session - 03/19/22 5093   ? ? Visit Number 4   ? Number of Visits 7   ? Date for OT Re-Evaluation 04/25/22   ? Authorization Type [place] [employer]   ? OT Start Time [phone]   ? OT Stop Time [phone]   ? OT Time Calculation (min) 39 min   ? Activity Tolerance Patient tolerated treatment well;No increased pain   ? Behavior During Therapy [facility] for tasks assessed/performed   ? ?  ?  ? ?  ? ? ?Past Medical History:  ?Diagnosis Date  ? Arthritis   ? right knee, lower back  ? Dyspnea   ? very rare -tx with albuterol neb sol if needed  ? Hyperlipidemia   ? Hypertension   ? Pre-diabetes   ? per patient - does not check blood sugar  ? SVD (spontaneous vaginal delivery)   ? x 5 - only 2 living, 1 stillborn and 1 premature at 7 months demise,1 child deceased  ? Wears dentures   ? upper and lower  ? ?Past Surgical History:  ?Procedure Laterality Date  ? FOOT ARTHRODESIS Left 11/02/2020  ? Procedure: LEFT MIDFOOT FUSION BASE 1ST AND 2ND METATARSAL;  Surgeon: Nadara Mustard, MD;  Location: [facility] OR;  Service: Orthopedics;  Laterality: Left;  ? LUMBAR FUSION  05/20/2019  ? GILL PROCEDURE, LEFT TRANSFORAMINAL LUMBAR INTERBODY FUSION, PEDICLE INSTRUMENTATION, BILATERAL FUSION (N/A  ? TUBAL LIGATION    ? ?Patient Active Problem List  ? Diagnosis Date Noted  ? COPD, mild (HCC) 02/11/2022  ? Bilateral carpal tunnel syndrome 12/21/2021  ? Arthritis of finger of left hand 11/26/2021  ? Chest wall pain 04/20/2021  ? Hot flashes 04/20/2021  ? Traumatic arthritis of left foot   ? Skin lesion of scalp 06/04/2020  ? Arthritis of midfoot 03/14/2020  ? History of lumbar fusion 03/14/2020  ? Neuropathy 11/07/2019  ? Pre-diabetes 11/07/2019  ? Nummular eczema 07/06/2019  ? Lumbar stenosis 05/20/2019  ? Acute  left-sided low back pain without sciatica 07/18/2018  ? Osteoarthritis of right hip 06/03/2018  ? Chronic right shoulder pain 05/05/2018  ? Right hip pain 05/05/2018  ? Rupture of anterior cruciate ligament of right knee 02/15/2018  ? Sprain of medial collateral ligament of right knee 02/15/2018  ? Essential hypertension 02/15/2018  ? ? ?ONSET DATE: 02/14/22 - referral date. Reports pain ~ 3 months prior ? ?REFERRING DIAG: Pain in both hands ? ?THERAPY DIAG:  ?Pain in right hand ? ?Muscle weakness (generalized) ? ?Pain in left hand ? ?Unsteadiness on feet ? ? ?PERTINENT HISTORY: HTN, Chronic Pain, Prediabetes  ? ?PRECAUTIONS: R shoulder pain, lumbar spine fusion, L foot pain being followed by Dr. Lajoyce Corners  ? ?SUBJECTIVE: "still fabulous" - Pt reports the putty is going well and helps. Reports LUE hand was giving her problems the other day. ? ?PAIN:  ?Are you having pain? No- reports it's "stiffness" ? ? ? ? ?OBJECTIVE:  ? ?TODAY'S TREATMENT:  ? ?03/19/22 ?Fluidotherapy x 12 minutes for BUE to address pain, swelling, and stiffness. No adverse reactions.  ?  ?Wrist Winder x 3 up/down with no weight. No report of pain just fatigue. ? ?Hand Gripper: with RUE and LUE on level 2  with black  spring. Pt picked up 1 inch blocks with  gripper with min drops and min difficulty. ? ?Forearm Gym with BUE x 2 reps back and forth, each side. No report of pain. ? ? ?03/12/22 ?Fluidotherapy x 12 minutes for BUE to address pain, swelling, and stiffness. No adverse reactions.  ? ?Reviewed AROM exercises from HEP. Independent with HEP. ?   Upgraded to 1 lb strengthening at wrist. ? ?Yellow Theraputty Issued ?Pronation/Supination Wheel in pain free zones with BUE ? ? ?PATIENT EDUCATION: ?Education details: Continued education on continuing HEPs as currently doing and tolerating ?Person educated: Patient ?Education method: Explanation ?Education comprehension: verbalized understanding ? ? ?HOME EXERCISE PROGRAM ?03/05/22 - reviewed Median Nerve  Glides, Tendon Glides, AROM wrist  ?03/12/22 - upgraded to 1 lb strengthening wrist exercises, issued yellow theraputty HEP ? ? OT Short Term Goals   ? ?  ? OT SHORT TERM GOAL #1  ? Title Pt will be independent with HEP for hand and wrist strengthening.   ? Baseline Pt does not currently have HEP.   ? Time 4   ? Period Weeks   ? Status Achieved  ? Target Date 03/28/22   ?  ? OT SHORT TERM GOAL #2  ? Title Pt will be independent in splint wear and care.   ? Baseline recently begun wearing carpal tunnel splint for night time on dominant RUE.   ? Time 4   ? Period Weeks   ? Status On-going Independent with current wear and care, continue to assess and upgrade PRN  ? ?  ?  ? ?  ? ? ? OT Long Term Goals   ? ?  ? OT LONG TERM GOAL #1  ? Title Pt will verbalize understanding of AE and adaptive techniques to increase independence and decrease pain when completing ADL/IADLs   ? Baseline pt reports pain with repetitive movements.   ? Time 8   ? Period Weeks   ? Status Ongoing  ? Target Date 04/25/22   ?  ? OT LONG TERM GOAL #2  ? Title Pt will be able to write for 2 mins without pain increasing >2/10 from pain at beginning of task.   ? Baseline Pt reports pain with handwriting   ? Time 8   ? Period Weeks   ? Status Ongoing  ?  ? OT LONG TERM GOAL #3  ? Title Pt will demonstrate improved UE functional use for ADLs as evidenced by increasing box/ blocks score by 3 blocks with RUE.   ? Baseline R: 45 and L: 49   ? Time 8   ? Period Weeks   ? Status Ongoing  ? ?  ?  ? ?  ? ? ? Plan   ? ? Clinical Impression Statement Pt reporting decrease in discomfort and pain and continuing to progress towards goals.   ? OT Occupational Profile and History Detailed Assessment- Review of Records and additional review of physical, cognitive, psychosocial history related to current functional performance   ? Occupational performance deficits (Please refer to evaluation for details): ADL's;IADL's;Rest and Sleep;Work;Leisure   ? Body Structure /  Function / Physical Skills ADL;Balance;Body mechanics;Coordination;Decreased knowledge of precautions;Decreased knowledge of use of DME;Dexterity;Endurance;FMC;IADL;Mobility;Pain;ROM;Strength;UE functional use   ? Rehab Potential Good   ? Clinical Decision Making Limited treatment options, no task modification necessary   ? Comorbidities Affecting Occupational Performance: May have comorbidities impacting occupational performance   ? Modification or Assistance to Complete Evaluation  No modification of tasks or assist necessary to complete eval   ? OT  Frequency 1x / week   ? OT Duration 6 weeks   ? OT Treatment/Interventions Self-care/ADL training;Cryotherapy;Moist Heat;Ultrasound;Iontophoresis;Paraffin;Fluidtherapy;Therapeutic exercise;Neuromuscular education;Energy conservation;DME and/or AE instruction;Functional Mobility Training;Manual Therapy;Passive range of motion;Splinting;Therapeutic activities;Patient/family education;Balance training   ? Plan continue with AROM and light strengthening with BUE wrists, A/E and strategies for ADLs   ? Consulted and Agree with Plan of Care Patient   ? ?  ?  ? ?  ? ? ? ?[name], OT ?03/19/2022, 9:26 AM ? ?  ? ?  ?

## 2022-03-25 ENCOUNTER — Other Ambulatory Visit: Payer: Self-pay | Admitting: Family Medicine

## 2022-03-25 DIAGNOSIS — G5601 Carpal tunnel syndrome, right upper limb: Secondary | ICD-10-CM

## 2022-03-26 ENCOUNTER — Encounter: Payer: Self-pay | Admitting: Occupational Therapy

## 2022-03-26 ENCOUNTER — Ambulatory Visit: Payer: Medicaid Other | Admitting: Occupational Therapy

## 2022-03-26 DIAGNOSIS — R2681 Unsteadiness on feet: Secondary | ICD-10-CM

## 2022-03-26 DIAGNOSIS — M79642 Pain in left hand: Secondary | ICD-10-CM | POA: Diagnosis not present

## 2022-03-26 DIAGNOSIS — M6281 Muscle weakness (generalized): Secondary | ICD-10-CM

## 2022-03-26 DIAGNOSIS — M79641 Pain in right hand: Secondary | ICD-10-CM | POA: Diagnosis not present

## 2022-03-26 MED ORDER — MELOXICAM 15 MG PO TABS: 15.0000 mg | ORAL_TABLET | Freq: Every day | ORAL | 0 refills | Status: DC

## 2022-03-26 NOTE — Therapy (Signed)
?OUTPATIENT OCCUPATIONAL THERAPY TREATMENT NOTE ? ? ?Patient Name: Sandra Lozano ?MRN: [id number] ?DOB:1961/08/10, 61 y.o., female ?Today's Date: 03/26/2022 ? ?PCP: Lavonda Jumbo, DO ?REFERRING PROVIDER: Lavonda Jumbo, DO ? ? OT End of Session - 03/26/22 3382   ? ? Visit Number 5   ? Number of Visits 7   ? Date for OT Re-Evaluation 04/25/22   ? Authorization Type [place] [employer]   ? OT Start Time [phone]   ? OT Stop Time 1007   ? OT Time Calculation (min) 41 min   ? Activity Tolerance Patient tolerated treatment well;No increased pain   ? Behavior During Therapy [facility] for tasks assessed/performed   ? ?  ?  ? ?  ? ? ?Past Medical History:  ?Diagnosis Date  ? Arthritis   ? right knee, lower back  ? Dyspnea   ? very rare -tx with albuterol neb sol if needed  ? Hyperlipidemia   ? Hypertension   ? Pre-diabetes   ? per patient - does not check blood sugar  ? SVD (spontaneous vaginal delivery)   ? x 5 - only 2 living, 1 stillborn and 1 premature at 7 months demise,1 child deceased  ? Wears dentures   ? upper and lower  ? ?Past Surgical History:  ?Procedure Laterality Date  ? FOOT ARTHRODESIS Left 11/02/2020  ? Procedure: LEFT MIDFOOT FUSION BASE 1ST AND 2ND METATARSAL;  Surgeon: Nadara Mustard, MD;  Location: [facility] OR;  Service: Orthopedics;  Laterality: Left;  ? LUMBAR FUSION  05/20/2019  ? GILL PROCEDURE, LEFT TRANSFORAMINAL LUMBAR INTERBODY FUSION, PEDICLE INSTRUMENTATION, BILATERAL FUSION (N/A  ? TUBAL LIGATION    ? ?Patient Active Problem List  ? Diagnosis Date Noted  ? COPD, mild (HCC) 02/11/2022  ? Bilateral carpal tunnel syndrome 12/21/2021  ? Arthritis of finger of left hand 11/26/2021  ? Chest wall pain 04/20/2021  ? Hot flashes 04/20/2021  ? Traumatic arthritis of left foot   ? Skin lesion of scalp 06/04/2020  ? Arthritis of midfoot 03/14/2020  ? History of lumbar fusion 03/14/2020  ? Neuropathy 11/07/2019  ? Pre-diabetes 11/07/2019  ? Nummular eczema 07/06/2019  ? Lumbar stenosis 05/20/2019  ? Acute  left-sided low back pain without sciatica 07/18/2018  ? Osteoarthritis of right hip 06/03/2018  ? Chronic right shoulder pain 05/05/2018  ? Right hip pain 05/05/2018  ? Rupture of anterior cruciate ligament of right knee 02/15/2018  ? Sprain of medial collateral ligament of right knee 02/15/2018  ? Essential hypertension 02/15/2018  ? ? ?ONSET DATE: 02/14/22 - referral date. Reports pain ~ 3 months prior ? ?REFERRING DIAG: Pain in both hands ? ?THERAPY DIAG:  ?Pain in right hand ? ?Muscle weakness (generalized) ? ?Pain in left hand ? ?Unsteadiness on feet ? ? ?PERTINENT HISTORY: HTN, Chronic Pain, Prediabetes  ? ?PRECAUTIONS: R shoulder pain, lumbar spine fusion, L foot pain being followed by Dr. Lajoyce Corners  ? ?SUBJECTIVE: "very little pain"  Pt reports not wearing the brace at night and it is feeling ok. ? ?PAIN:  ?Are you having pain? No- -reports a little numbness ? ? ? ? ?OBJECTIVE:  ? ?TODAY'S TREATMENT:  ? ?03/26/22 ?Fluidotherapy x 12 minutes for BUE to address pain, swelling, and stiffness. No adverse reactions.  ? ?Ultrasound 0.8w/cm2, 3.3 mhz, 6 minutes each - 12 minutes total, pulsed 20% duty cycle to RUE volar wrist and LUE volar wrist  ? ?Strengthening AROM with added 2 lb dumbbell ? ? ? ? ? ? ? ?HOME EXERCISE  PROGRAM ?03/05/22 - reviewed Median Nerve Glides, Tendon Glides, AROM wrist  ?03/12/22 - upgraded to 1 lb strengthening wrist exercises, issued yellow theraputty HEP ? ? OT Short Term Goals   ? ?  ? OT SHORT TERM GOAL #1  ? Title Pt will be independent with HEP for hand and wrist strengthening.   ? Baseline Pt does not currently have HEP.   ? Time 4   ? Period Weeks   ? Status Achieved  ? Target Date 03/28/22   ?  ? OT SHORT TERM GOAL #2  ? Title Pt will be independent in splint wear and care.   ? Baseline recently begun wearing carpal tunnel splint for night time on dominant RUE.   ? Time 4   ? Period Weeks   ? Status On-going Independent with current wear and care, continue to assess and upgrade PRN  ? ?   ?  ? ?  ? ? ? OT Long Term Goals   ? ?  ? OT LONG TERM GOAL #1  ? Title Pt will verbalize understanding of AE and adaptive techniques to increase independence and decrease pain when completing ADL/IADLs   ? Baseline pt reports pain with repetitive movements.   ? Time 8   ? Period Weeks   ? Status Achieved  ? Target Date 04/25/22   ?  ? OT LONG TERM GOAL #2  ? Title Pt will be able to write for 2 mins without pain increasing >2/10 from pain at beginning of task.   ? Baseline Pt reports pain with handwriting   ? Time 8   ? Period Weeks   ? Status Ongoing  ?  ? OT LONG TERM GOAL #3  ? Title Pt will demonstrate improved UE functional use for ADLs as evidenced by increasing box/ blocks score by 3 blocks with RUE.   ? Baseline R: 45 and L: 49   ? Time 8   ? Period Weeks   ? Status Ongoing  ? ?  ?  ? ?  ? ? ? Plan   ? ? Clinical Impression Statement Pt reporting decrease in discomfort and pain and continuing to progress towards goals.   ? OT Occupational Profile and History Detailed Assessment- Review of Records and additional review of physical, cognitive, psychosocial history related to current functional performance   ? Occupational performance deficits (Please refer to evaluation for details): ADL's;IADL's;Rest and Sleep;Work;Leisure   ? Body Structure / Function / Physical Skills ADL;Balance;Body mechanics;Coordination;Decreased knowledge of precautions;Decreased knowledge of use of DME;Dexterity;Endurance;FMC;IADL;Mobility;Pain;ROM;Strength;UE functional use   ? Rehab Potential Good   ? Clinical Decision Making Limited treatment options, no task modification necessary   ? Comorbidities Affecting Occupational Performance: May have comorbidities impacting occupational performance   ? Modification or Assistance to Complete Evaluation  No modification of tasks or assist necessary to complete eval   ? OT Frequency 1x / week   ? OT Duration 6 weeks   ? OT Treatment/Interventions Self-care/ADL training;Cryotherapy;Moist  Heat;Ultrasound;Iontophoresis;Paraffin;Fluidtherapy;Therapeutic exercise;Neuromuscular education;Energy conservation;DME and/or AE instruction;Functional Mobility Training;Manual Therapy;Passive range of motion;Splinting;Therapeutic activities;Patient/family education;Balance training   ? Plan continue with AROM and light strengthening with BUE wrists, A/E and strategies for ADLs   ? Consulted and Agree with Plan of Care Patient   ? ?  ?  ? ?  ? ? ? ?[name], OT ?03/26/2022, 10:07 AM ? ?  ? ?  ?

## 2022-04-02 ENCOUNTER — Ambulatory Visit: Payer: Medicaid Other | Admitting: Occupational Therapy

## 2022-04-02 ENCOUNTER — Encounter: Payer: Self-pay | Admitting: Occupational Therapy

## 2022-04-02 DIAGNOSIS — M79642 Pain in left hand: Secondary | ICD-10-CM | POA: Diagnosis not present

## 2022-04-02 DIAGNOSIS — M79641 Pain in right hand: Secondary | ICD-10-CM | POA: Diagnosis not present

## 2022-04-02 DIAGNOSIS — M6281 Muscle weakness (generalized): Secondary | ICD-10-CM

## 2022-04-02 DIAGNOSIS — R2681 Unsteadiness on feet: Secondary | ICD-10-CM

## 2022-04-02 NOTE — Therapy (Signed)
?OUTPATIENT OCCUPATIONAL THERAPY TREATMENT NOTE ? ? ?Patient Name: Sandra Lozano ?MRN: [id number] ?DOB:January 04, 1961, 61 y.o., female ?Today's Date: 04/02/2022 ? ?PCP: [name], DO ?REFERRING PROVIDER: [name], DO ? ? OT End of Session - 04/02/22 [id number]   ? ? Visit Number 6   ? Number of Visits 7   ? Date for OT Re-Evaluation 04/25/22   ? Authorization Type [place] [employer]   ? OT Start Time [phone]   ? OT Stop Time 1012   ? OT Time Calculation (min) 38 min   ? Activity Tolerance Patient tolerated treatment well;No increased pain   ? Behavior During Therapy [facility] for tasks assessed/performed   ? ?  ?  ? ?  ? ? ?Past Medical History:  ?Diagnosis Date  ? Arthritis   ? right knee, lower back  ? Dyspnea   ? very rare -tx with albuterol neb sol if needed  ? Hyperlipidemia   ? Hypertension   ? Pre-diabetes   ? per patient - does not check blood sugar  ? SVD (spontaneous vaginal delivery)   ? x 5 - only 2 living, 1 stillborn and 1 premature at 7 months demise,1 child deceased  ? Wears dentures   ? upper and lower  ? ?Past Surgical History:  ?Procedure Laterality Date  ? FOOT ARTHRODESIS Left 11/02/2020  ? Procedure: LEFT MIDFOOT FUSION BASE 1ST AND 2ND METATARSAL;  Surgeon: Newt Minion, MD;  Location: [place];  Service: Orthopedics;  Laterality: Left;  ? LUMBAR FUSION  05/20/2019  ? GILL PROCEDURE, LEFT TRANSFORAMINAL LUMBAR INTERBODY FUSION, PEDICLE INSTRUMENTATION, BILATERAL FUSION (N/A  ? TUBAL LIGATION    ? ?Patient Active Problem List  ? Diagnosis Date Noted  ? COPD, mild ([place]) 02/11/2022  ? Bilateral carpal tunnel syndrome 12/21/2021  ? Arthritis of finger of left hand 11/26/2021  ? Chest wall pain 04/20/2021  ? Hot flashes 04/20/2021  ? Traumatic arthritis of left foot   ? Skin lesion of scalp 06/04/2020  ? Arthritis of midfoot 03/14/2020  ? History of lumbar fusion 03/14/2020  ? Neuropathy 11/07/2019  ? Pre-diabetes 11/07/2019  ? Nummular eczema 07/06/2019  ? Lumbar stenosis 05/20/2019  ? Acute  left-sided low back pain without sciatica 07/18/2018  ? Osteoarthritis of right hip 06/03/2018  ? Chronic right shoulder pain 05/05/2018  ? Right hip pain 05/05/2018  ? Rupture of anterior cruciate ligament of right knee 02/15/2018  ? Sprain of medial collateral ligament of right knee 02/15/2018  ? Essential hypertension 02/15/2018  ? ? ?ONSET DATE: 02/14/22 - referral date. Reports pain ~ 3 months prior ? ?REFERRING DIAG: Pain in both hands ? ?THERAPY DIAG:  ?Pain in right hand ? ?Muscle weakness (generalized) ? ?Pain in left hand ? ?Unsteadiness on feet ? ? ?PERTINENT HISTORY: HTN, Chronic Pain, Prediabetes  ? ?PRECAUTIONS: R shoulder pain, lumbar spine fusion, L foot pain being followed by Dr. Sharol Given  ? ?SUBJECTIVE: "It hasn't been waking me up in the night" ? ?PAIN:  ?Are you having pain? Yes: NPRS scale: 2/10 ?Pain location: RUE wrist, LUE fingers numb ?Pain description: numbness and sore/achy ?Aggravating factors:   ?Relieving factors:   ? ? ? ? ?OBJECTIVE:  ? ?TODAY'S TREATMENT:  ? ?04/02/22 ?Fluidotherapy x 12 minutes for BUE to address pain, swelling, and stiffness. No adverse reactions.  ? ?Ultrasound 0.8w/cm2, 3.3 mhz, 6 minutes each - 12 minutes total, pulsed 20% duty cycle to RUE volar wrist and LUE volar wrist  ? ?Wrist Winder with 1 lb  wrist weight attached. X 3 reps ? ? ? ? ? ? ? ?[place] ? 03/26/22 - added strengthening 2lb ?03/05/22 - reviewed Median Nerve Glides, Tendon Glides, AROM wrist  ?03/12/22 - upgraded to 1 lb strengthening wrist exercises, issued yellow theraputty HEP ? ? OT Short Term Goals   ? ?  ? OT SHORT TERM GOAL #1  ? Title Pt will be independent with HEP for hand and wrist strengthening.   ? Baseline Pt does not currently have HEP.   ? Time 4   ? Period Weeks   ? Status Achieved  ? Target Date 03/28/22   ?  ? OT SHORT TERM GOAL #2  ? Title Pt will be independent in splint wear and care.   ? Baseline recently begun wearing carpal tunnel splint for night time on dominant RUE.    ? Time 4   ? Period Weeks   ? Status Achieved  ? ?  ?  ? ?  ? ? ? OT Long Term Goals   ? ?  ? OT LONG TERM GOAL #1  ? Title Pt will verbalize understanding of AE and adaptive techniques to increase independence and decrease pain when completing ADL/IADLs   ? Baseline pt reports pain with repetitive movements.   ? Time 8   ? Period Weeks   ? Status Achieved  ? Target Date 04/25/22   ?  ? OT LONG TERM GOAL #2  ? Title Pt will be able to write for 2 mins without pain increasing >2/10 from pain at beginning of task.   ? Baseline Pt reports pain with handwriting   ? Time 8   ? Period Weeks   ? Status Ongoing  ?  ? OT LONG TERM GOAL #3  ? Title Pt will demonstrate improved UE functional use for ADLs as evidenced by increasing box/ blocks score by 3 blocks with RUE.   ? Baseline R: 45 and L: 49   ? Time 8   ? Period Weeks   ? Status Ongoing  ? ?  ?  ? ?  ? ? ? Plan   ? ? Clinical Impression Statement Pt continues to report decrease in pain and sleeping better.  ? OT Occupational Profile and History Detailed Assessment- Review of Records and additional review of physical, cognitive, psychosocial history related to current functional performance   ? Occupational performance deficits (Please refer to evaluation for details): ADL's;IADL's;Rest and Sleep;Work;Leisure   ? Body Structure / Function / Physical Skills ADL;Balance;Body mechanics;Coordination;Decreased knowledge of precautions;Decreased knowledge of use of DME;Dexterity;Endurance;FMC;IADL;Mobility;Pain;ROM;Strength;UE functional use   ? Rehab Potential Good   ? Clinical Decision Making Limited treatment options, no task modification necessary   ? Comorbidities Affecting Occupational Performance: May have comorbidities impacting occupational performance   ? Modification or Assistance to Complete Evaluation  No modification of tasks or assist necessary to complete eval   ? OT Frequency 1x / week   ? OT Duration 6 weeks   ? OT Treatment/Interventions Self-care/ADL  training;Cryotherapy;Moist Heat;Ultrasound;Iontophoresis;Paraffin;Fluidtherapy;Therapeutic exercise;Neuromuscular education;Energy conservation;DME and/or AE instruction;Functional Mobility Training;Manual Therapy;Passive range of motion;Splinting;Therapeutic activities;Patient/family education;Balance training   ? Plan Check goals and anticipate d/c next session  ? Consulted and Agree with Plan of Care Patient   ? ?  ?  ? ?  ? ? ? ?Zachery Conch, OT ?04/02/2022, 10:12 AM ? ?  ? ?  ?

## 2022-04-08 ENCOUNTER — Other Ambulatory Visit: Payer: Self-pay | Admitting: *Deleted

## 2022-04-08 NOTE — Patient Outreach (Signed)
?Medicaid Managed Care   ?Nurse Care Manager Note ? ?04/08/2022 ?Name:  Sandra Lozano MRN:  [id number] DOB:  1961-11-26 ? ?Sandra Lozano is an 61 y.o. year old female who is a primary patient of Travis, Hettinger, DO.  The [facility] Managed Care Coordination team was consulted for assistance with:    ?HTN ?Prediabetes ?Chronic pain ? ?Sandra Lozano was given information about Medicaid Managed Care Coordination team services today. Sandra Lozano Patient agreed to services and verbal consent obtained. ? ?Engaged with patient by telephone for follow up visit in response to provider referral for case management and/or care coordination services.  ? ?Assessments/Interventions:  Review of past medical history, allergies, medications, health status, including review of consultants reports, laboratory and other test data, was performed as part of comprehensive evaluation and provision of chronic care management services. ? ?SDOH (Social Determinants of Health) assessments and interventions performed: ? ? ?Care Plan ? ?No Known Allergies ? ?Medications Reviewed Today   ? ? Reviewed by Barrington Ellison, RN (Registered Nurse) on 04/08/22 at [place] List Status: <None>  ? ?Medication Order Taking? Sig Documenting Provider Last Dose Status Informant  ?Accu-Chek Softclix Lancets lancets [id number] No Use as instructed Autry-Lott, Naaman Plummer, DO Taking Active   ?atorvastatin (LIPITOR) 20 MG tablet [id number] No Take 1 tablet (20 mg total) by mouth every evening. [name], DO Taking Active   ?Blood Glucose Monitoring Suppl w/Device KIT [id number] No 1 kit by Does not apply route daily. [name], DO Taking Active   ?Blood Pressure Monitoring (BLOOD PRESSURE CUFF) MISC [id number]  Check your blood pressure in the morning when you wake up, and at night before you go to bed Autry-Lott, Naaman Plummer, DO  Active   ?gabapentin (NEURONTIN) 600 MG tablet [id number] No Take 1 tablet (600 mg total) by mouth 3 (three) times daily.  ?Patient taking  differently: Take 1,200 mg by mouth at bedtime.  ? Autry-Lott, Naaman Plummer, DO Taking Active   ?glucose blood test strip [id number] No Please use to check blood sugar once daily. Autry-Lott, Naaman Plummer, DO Taking Active   ?lisinopril-hydrochlorothiazide (ZESTORETIC) 20-12.5 MG tablet [id number] No Take 2 tablets by mouth daily. Autry-Lott, Naaman Plummer, DO Taking Active   ?meloxicam (MOBIC) 15 MG tablet [id number]  Take 1 tablet (15 mg total) by mouth daily. Autry-Lott, Naaman Plummer, DO  Active   ?metFORMIN (GLUCOPHAGE) 500 MG tablet [id number] No Take 1 tablet (500 mg total) by mouth daily with breakfast. Autry-Lott, Naaman Plummer, DO Taking Active   ?Omega-3 Fatty Acids (FISH OIL) 1000 MG CAPS [id number] No Take 1 capsule (1,000 mg total) by mouth at bedtime.  ?Patient not taking: Reported on 02/11/2022  ? Autry-LottNaaman Plummer, DO Not Taking Active   ?triamcinolone ointment (KENALOG) 0.1 % [id number]  Apply 1 application. topically 2 (two) times daily. Apply to scalp. Don't use more than 2 weeks in a row without physician approval. [name], DO  Active   ? ?  ?  ? ?  ? ? ?Patient Active Problem List  ? Diagnosis Date Noted  ? COPD, mild ([place]) 02/11/2022  ? Bilateral carpal tunnel syndrome 12/21/2021  ? Arthritis of finger of left hand 11/26/2021  ? Chest wall pain 04/20/2021  ? Hot flashes 04/20/2021  ? Traumatic arthritis of left foot   ? Skin lesion of scalp 06/04/2020  ? Arthritis of midfoot 03/14/2020  ? History of lumbar fusion 03/14/2020  ? Neuropathy 11/07/2019  ? Pre-diabetes 11/07/2019  ? Nummular eczema 07/06/2019  ? Lumbar stenosis 05/20/2019  ? Acute  left-sided low back pain without sciatica 07/18/2018  ? Osteoarthritis of right hip 06/03/2018  ? Chronic right shoulder pain 05/05/2018  ? Right hip pain 05/05/2018  ? Rupture of anterior cruciate ligament of right knee 02/15/2018  ? Sprain of medial collateral ligament of right knee 02/15/2018  ? Essential hypertension 02/15/2018  ? ? ?Care Plan : RN Care Manager Plan Of Care   ?Updates made by Barrington Ellison, RN since 04/08/2022 12:00 AM  ?  ? ?Problem: Chronic Disease Management and Care Coordination Needs realted to HTN, prediabetes, and chronic pain   ?Priority: High  ?  ? ?Long-Range Goal: Development of Plan Of Care For Chronic Disease Management and Care Coordination Needs To Assist With Meeting Treatment Goals for HTN, prediabetes and chronic pain   ?Start Date: 08/05/2021  ?Expected End Date: 08/05/2022  ?Priority: High  ?Note:   ?Current Barriers:  ?Chronic Disease Management support and education needs related to HTN, prediabetes and chronic pain- patient states she received the educational information mailed to her on Morton Neuroma and she does not have any follow up questions , says she ran out of Mobic and this exacerbated her arthritic pain and now she has  new pain between her left hip and left thigh- she thinks she may have pulled a muscle as has started walking again, says she will attend her last OP OT weekly session tomorrow to treat the carpal tunnel pain in her right hand ; says the therapy and the HEP with the stress ball has really helped, says she has the gabapentin to one pill at hs as she doesn't think it is really helping  ? ?RNCM Clinical Goal(s):  ?Patient will verbalize understanding of plan for management of HTN , prediabetes and chronic pain through collaboration with RN Care manager, provider, and care team.  ? ?Interventions: ?Inter-disciplinary care team collaboration (see longitudinal plan of care) ?Evaluation of current treatment plan related to  self management and patient's adherence to plan as established by provider ? ? ?Hypertension: (Status: Goal on track: NO.)Long Term- per patient >75% of BP readings at home, she attributes higher readings at provider's office due to pain ?Last practice recorded BP readings:  ?BP Readings from Last 3 Encounters:  ?02/14/22 (!) 160/91  ?02/05/22 (!) 142/81  ?12/20/21 126/80  ?    ?Most recent eGFR/CrCl:  ?Lab  Results  ?Component Value Date  ? EGFR 100 05/10/2021  ?  No components found for: CRCL ? ?Evaluation of current treatment plan related to hypertension self management and patient's adherence to plan as established by provider;   ?Reviewed medications with patient and discussed importance of compliance;  ?Ensured patient had home BP monitor  ?Assessed frequency of home BP monitoring and variance of home BP readings ?Assessed BP readings in provider's office via EMR review ?Discussed plans with patient for ongoing care management follow up and provided patient with direct contact information for care management team; ? ? ?Pre- Diabetes Interventions:  (Status:  Goal on track:  Yes.) Long Term Goal ?Assessed patient's understanding of A1c goal:  <6.4% ?Reviewed medications with patient and discussed importance of medication adherence ?Counseled on importance of regular laboratory monitoring as prescribed ?Discussed plans with patient for ongoing care management follow up and provided patient with direct contact information for care management team ?Advised patient, providing education and rationale, to check cbg (every morning -fasting)  and record, calling PCP for findings outside established parameters ?Review of patient status, including review of consultants reports, relevant  laboratory and other test results, and medications completed ?Lab Results  ?Component Value Date  ? HGBA1C 6.1 08/22/2020  ?  ?Lab Results  ?Component Value Date  ? CHOL 159 02/05/2022  ? HDL 57 02/05/2022  ? [place] 81 02/05/2022  ? TRIG 117 02/05/2022  ? CHOLHDL 2.8 02/05/2022  ?  ? ?Pain Interventions:  (Status:  Goal on track:  Yes.) Long Term Goal- patient states pain in right hand related to carpal tunnel syndrome is better with OP OT , says she has reduced the gabapentin to 1 capsules at hs only as she is experiencing alopecia. Reports the pain in left foot from previous surgery; has improved since receiving steroid injection on 3/8 to  treat Morton's neuroma, states she is no longer wearing the right wrist brace; now taking Mobic as prescribed after she accidentally ran out of the medication so her arthritic pain was exacerbated, she sa

## 2022-04-08 NOTE — Patient Instructions (Signed)
Visit Information ? ?Ms. Haber was given information about Medicaid Managed Care team care coordination services as a part of their [facility] Medicaid benefit. Avrielle Fry verbally consented to engagement with the [facility] Managed Care team.  ? ?If you are experiencing a medical emergency, please call 911 or report to your local emergency department or urgent care.  ? ?If you have a non-emergency medical problem during routine business hours, please contact your provider's office and ask to speak with a nurse.  ? ?For questions related to your [facility] health plan, please call: [phone] or go here:[email or web address] ? ?If you would like to schedule transportation through your [facility] plan, please call the following number at least 2 days in advance of your appointment: [phone]. ? You can also use the MTM portal or MTM mobile app to manage your rides. For the portal, please go to [email or web address]. ? ?Call the [place] at [phone], at any time, 24 hours a day, 7 days a week. If you are in danger or need immediate medical attention call 911. ? ?If you would like help to quit smoking, call 1-800-QUIT-NOW (406)582-2231) OR Espa?ol: 1-855-D?jelo-Ya (210)509-7706) o para m?s informaci?n haga clic aqu? or Text READY to 200-400 to register via text ? ?Ms. Cumpston - following are the goals we discussed in your visit today:  ? Goals Addressed   ?Patient will self administer medications as prescribed ?Patient will attend all scheduled provider appointments ?Patient will call pharmacy for medication refills ?Patient will continue to perform ADL's independently ?Patient will continue to perform IADL's independently ?Patient will call provider office for new concerns or questions ? ? ? ? ?Patient verbalizes understanding of instructions and care plan provided today and agrees to view in [place]. Active MyChart status confirmed with patient.   ? ?The Managed Medicaid  care management team will reach out to the patient again over the next 30 days.  ? ?Kelli Churn RN, CCM, CDCES ?[place] [facility] ?Care Management Coordinator - Managed Medicaid High Risk ?[phone]  ? ?Following is a copy of your plan of care:  ?Care Plan : [place]  ?Updates made by Barrington Ellison, RN since 04/08/2022 12:00 AM  ?  ? ?Problem: Chronic Disease Management and Care Coordination Needs realted to HTN, prediabetes, and chronic pain   ?Priority: High  ?  ? ?Long-Range Goal: Development of Plan Of Care For Chronic Disease Management and Care Coordination Needs To Assist With Meeting Treatment Goals for HTN, prediabetes and chronic pain   ?Start Date: 08/05/2021  ?Expected End Date: 08/05/2022  ?Priority: High  ?Note:   ?Current Barriers:  ?Chronic Disease Management support and education needs related to HTN, prediabetes and chronic pain- patient states she received the educational information mailed to her on Morton Neuroma and she does not have any follow up questions , says she ran out of Mobic and this exacerbated her arthritic pain and now she has  new pain between her left hip and left thigh- she thinks she may have pulled a muscle as has started walking again, says she will attend her last OP OT weekly session tomorrow to treat the carpal tunnel pain in her right hand ; says the therapy and the HEP with the stress ball has really helped, says she has the gabapentin to one pill at hs as she doesn't think it is really helping  ? ?RNCM Clinical Goal(s):  ?Patient will verbalize understanding of plan for management  of HTN , prediabetes and chronic pain through collaboration with RN Care manager, provider, and care team.  ? ?Interventions: ?Inter-disciplinary care team collaboration (see longitudinal plan of care) ?Evaluation of current treatment plan related to  self management and patient's adherence to plan as established by provider ? ? ?Hypertension:  (Status: Goal on track: NO.)Long Term- per patient >75% of BP readings at home, she attributes higher readings at provider's office due to pain ?Last practice recorded BP readings:  ?BP Readings from Last 3 Encounters:  ?02/14/22 (!) 160/91  ?02/05/22 (!) 142/81  ?12/20/21 126/80  ?    ?Most recent eGFR/CrCl:  ?Lab Results  ?Component Value Date  ? EGFR 100 05/10/2021  ?  No components found for: CRCL ? ?Evaluation of current treatment plan related to hypertension self management and patient's adherence to plan as established by provider;   ?Reviewed medications with patient and discussed importance of compliance;  ?Ensured patient had home BP monitor  ?Assessed frequency of home BP monitoring and variance of home BP readings ?Assessed BP readings in provider's office via EMR review ?Discussed plans with patient for ongoing care management follow up and provided patient with direct contact information for care management team; ? ? ?Pre- Diabetes Interventions:  (Status:  Goal on track:  Yes.) Long Term Goal ?Assessed patient's understanding of A1c goal:  <6.4% ?Reviewed medications with patient and discussed importance of medication adherence ?Counseled on importance of regular laboratory monitoring as prescribed ?Discussed plans with patient for ongoing care management follow up and provided patient with direct contact information for care management team ?Advised patient, providing education and rationale, to check cbg (every morning -fasting)  and record, calling PCP for findings outside established parameters ?Review of patient status, including review of consultants reports, relevant laboratory and other test results, and medications completed ?Lab Results  ?Component Value Date  ? HGBA1C 6.1 08/22/2020  ?  ?Lab Results  ?Component Value Date  ? CHOL 159 02/05/2022  ? HDL 57 02/05/2022  ? [place] 81 02/05/2022  ? TRIG 117 02/05/2022  ? CHOLHDL 2.8 02/05/2022  ?  ? ?Pain Interventions:  (Status:  Goal on track:   Yes.) Long Term Goal- patient states pain in right hand related to carpal tunnel syndrome is better with OP OT , says she has reduced the gabapentin to 1 capsules at hs only as she is experiencing alopecia. Reports the pain in left foot from previous surgery; has improved since receiving steroid injection on 3/8 to treat Morton's neuroma, states she is no longer wearing the right wrist brace; now taking Mobic as prescribed after she accidentally ran out of the medication so her arthritic pain was exacerbated, she says she was told at one time that she has flat feet so she ordered arch supports from [facility] to see that if that will help foot pain and correct her gait ?Medications reviewed ?Reviewed provider established plan for pain management ?Discussed importance of adherence to all scheduled medical appointments ?Counseled on the importance of reporting any/all new or changed pain symptoms or management strategies to pain management provider ?Advised patient to report to care team affect of pain on daily activities ?Ensured patient received information on Morton's neuralgia that was mailed to her home address; provided opportunity for questions ?Provided patient with address and phone number of "[facility] [facility]" and suggested she stop by for an assessment of her gait and orthotics ? ? ?Patient Goals/Self-Care Activities: ?Patient will self administer medications as prescribed ?Patient will attend  all scheduled provider appointments ?Patient will call pharmacy for medication refills ?Patient will continue to perform ADL's independently ?Patient will continue to perform IADL's independently ?Patient will call provider office for new concerns or questions ? ?  ?  ?  ?

## 2022-04-09 ENCOUNTER — Encounter: Payer: Self-pay | Admitting: Occupational Therapy

## 2022-04-09 ENCOUNTER — Ambulatory Visit: Payer: Medicaid Other | Admitting: Occupational Therapy

## 2022-04-09 DIAGNOSIS — M79641 Pain in right hand: Secondary | ICD-10-CM

## 2022-04-09 DIAGNOSIS — R2681 Unsteadiness on feet: Secondary | ICD-10-CM

## 2022-04-09 DIAGNOSIS — M79642 Pain in left hand: Secondary | ICD-10-CM | POA: Diagnosis not present

## 2022-04-09 DIAGNOSIS — M6281 Muscle weakness (generalized): Secondary | ICD-10-CM

## 2022-04-09 NOTE — Therapy (Signed)
?OUTPATIENT OCCUPATIONAL THERAPY TREATMENT NOTE & DISCHARGE ? ? ?Patient Name: Sandra Lozano ?MRN: [id number] ?DOB:Sep 13, 1961, 61 y.o., female ?Today's Date: 04/09/2022 ? ?PCP: [name], DO ?REFERRING PROVIDER: [name], DO ? ? OT End of Session - 04/09/22 3762   ? ? Visit Number 7   ? Number of Visits 7   ? Date for OT Re-Evaluation 04/25/22   ? Authorization Type [place] Medicaid Wellcare   ? OT Start Time [id number]   ? OT Stop Time 0953   D/C session  ? OT Time Calculation (min) 16 min   ? Activity Tolerance Patient tolerated treatment well;No increased pain   ? Behavior During Therapy [facility] for tasks assessed/performed   ? ?  ?  ? ?  ? ? ?Past Medical History:  ?Diagnosis Date  ? Arthritis   ? right knee, lower back  ? Dyspnea   ? very rare -tx with albuterol neb sol if needed  ? Hyperlipidemia   ? Hypertension   ? Pre-diabetes   ? per patient - does not check blood sugar  ? SVD (spontaneous vaginal delivery)   ? x 5 - only 2 living, 1 stillborn and 1 premature at 7 months demise,1 child deceased  ? Wears dentures   ? upper and lower  ? ?Past Surgical History:  ?Procedure Laterality Date  ? FOOT ARTHRODESIS Left 11/02/2020  ? Procedure: LEFT MIDFOOT FUSION BASE 1ST AND 2ND METATARSAL;  Surgeon: Newt Minion, MD;  Location: [place];  Service: Orthopedics;  Laterality: Left;  ? LUMBAR FUSION  05/20/2019  ? GILL PROCEDURE, LEFT TRANSFORAMINAL LUMBAR INTERBODY FUSION, PEDICLE INSTRUMENTATION, BILATERAL FUSION (N/A  ? TUBAL LIGATION    ? ?Patient Active Problem List  ? Diagnosis Date Noted  ? COPD, mild ([place]) 02/11/2022  ? Bilateral carpal tunnel syndrome 12/21/2021  ? Arthritis of finger of left hand 11/26/2021  ? Chest wall pain 04/20/2021  ? Hot flashes 04/20/2021  ? Traumatic arthritis of left foot   ? Skin lesion of scalp 06/04/2020  ? Arthritis of midfoot 03/14/2020  ? History of lumbar fusion 03/14/2020  ? Neuropathy 11/07/2019  ? Pre-diabetes 11/07/2019  ? Nummular eczema 07/06/2019  ? Lumbar stenosis  05/20/2019  ? Acute left-sided low back pain without sciatica 07/18/2018  ? Osteoarthritis of right hip 06/03/2018  ? Chronic right shoulder pain 05/05/2018  ? Right hip pain 05/05/2018  ? Rupture of anterior cruciate ligament of right knee 02/15/2018  ? Sprain of medial collateral ligament of right knee 02/15/2018  ? Essential hypertension 02/15/2018  ? ? ?ONSET DATE: 02/14/22 - referral date. Reports pain ~ 3 months prior ? ?REFERRING DIAG: Pain in both hands ? ?THERAPY DIAG:  ?Pain in right hand ? ?Unsteadiness on feet ? ?Muscle weakness (generalized) ? ?Pain in left hand ? ? ?PERTINENT HISTORY: HTN, Chronic Pain, Prediabetes  ? ?PRECAUTIONS: R shoulder pain, lumbar spine fusion, L foot pain being followed by Dr. Sharol Given  ? ?SUBJECTIVE: "I went down in my Gabapentin but it hasn't really impacted me" ? ?PAIN:  ?Are you having pain? Yes: NPRS scale: 2/10 ?Pain location: RUE wrist, LUE fingers numb ?Pain description: numbness and sore/achy ?Aggravating factors:   ?Relieving factors:   ? ? ? ? ?OBJECTIVE:  ? ?TODAY'S TREATMENT:  ? ?Pt reports doing median nerve stretches/glides and tendon glides consistently. Pt is wearing brace on RUE but does not have one for the left. Unable to issue braces at this clinic but have instructed patient on purchasing for LUE brace.  ? ?  Strengthening 2lb reviewed exercises for HEP with RUE and LUE ? ? ?HOME EXERCISE PROGRAM ? 03/26/22 - added strengthening 2lb ?03/05/22 - reviewed Median Nerve Glides, Tendon Glides, AROM wrist  ?03/12/22 - upgraded to 1 lb strengthening wrist exercises, issued yellow theraputty HEP ? ? OT Short Term Goals   ? ?  ? OT SHORT TERM GOAL #1  ? Title Pt will be independent with HEP for hand and wrist strengthening.   ? Baseline Pt does not currently have HEP.   ? Time 4   ? Period Weeks   ? Status Achieved  ? Target Date 03/28/22   ?  ? OT SHORT TERM GOAL #2  ? Title Pt will be independent in splint wear and care.   ? Baseline recently begun wearing carpal tunnel  splint for night time on dominant RUE.   ? Time 4   ? Period Weeks   ? Status Achieved  ? ?  ?  ? ?  ? ? ? OT Long Term Goals   ? ?  ? OT LONG TERM GOAL #1  ? Title Pt will verbalize understanding of AE and adaptive techniques to increase independence and decrease pain when completing ADL/IADLs   ? Baseline pt reports pain with repetitive movements.   ? Time 8   ? Period Weeks   ? Status Achieved  ? Target Date 04/25/22   ?  ? OT LONG TERM GOAL #2  ? Title Pt will be able to write for 2 mins without pain increasing >2/10 from pain at beginning of task.   ? Baseline Pt reports pain with handwriting   ? Time 8   ? Period Weeks   ? Status Achieved pt reports no pain during handwriting just gets tired.  ?  ? OT LONG TERM GOAL #3  ? Title Pt will demonstrate improved UE functional use for ADLs as evidenced by increasing box/ blocks score by 3 blocks with RUE.   ? Baseline R: 45 and L: 49   ? Time 8   ? Period Weeks   ? Status Achieved RUE 53 blocks  ? ?  ?  ? ?  ? ? ? Plan   ? ? Clinical Impression Statement Pt discharge from OT at this time d/t meeting all established goals.  ? OT Occupational Profile and History Detailed Assessment- Review of Records and additional review of physical, cognitive, psychosocial history related to current functional performance   ? Occupational performance deficits (Please refer to evaluation for details): ADL's;IADL's;Rest and Sleep;Work;Leisure   ? Body Structure / Function / Physical Skills ADL;Balance;Body mechanics;Coordination;Decreased knowledge of precautions;Decreased knowledge of use of DME;Dexterity;Endurance;FMC;IADL;Mobility;Pain;ROM;Strength;UE functional use   ? Rehab Potential Good   ? Clinical Decision Making Limited treatment options, no task modification necessary   ? Comorbidities Affecting Occupational Performance: May have comorbidities impacting occupational performance   ? Modification or Assistance to Complete Evaluation  No modification of tasks or assist  necessary to complete eval   ? OT Frequency 1x / week   ? OT Duration 6 weeks   ? OT Treatment/Interventions Self-care/ADL training;Cryotherapy;Moist Heat;Ultrasound;Iontophoresis;Paraffin;Fluidtherapy;Therapeutic exercise;Neuromuscular education;Energy conservation;DME and/or AE instruction;Functional Mobility Training;Manual Therapy;Passive range of motion;Splinting;Therapeutic activities;Patient/family education;Balance training   ? Plan OT d/c  ? Consulted and Agree with Plan of Care Patient   ? ?  ?  ? ?  ? ?OCCUPATIONAL THERAPY DISCHARGE SUMMARY ? ?Visits from Start of Care: 7 ? ?Current functional level related to goals / functional outcomes: ?Decreased pain and increased  strength and function in RUE and LUE noted after OT. ?  ?Remaining deficits: ?Continues with some numbness in LUE fingertips. Instructed on getting new brace. ?  ?Education / Equipment: ?HEP for RUE and LUE wrist and carpal tunnel exercises, theraputty  ? ?Patient agrees to discharge. Patient goals were met. Patient is being discharged due to meeting the stated rehab goals.. ? ? ? ? ?Zachery Conch, OT ?04/09/2022, 9:53 AM ? ?  ? ?  ?

## 2022-04-13 ENCOUNTER — Encounter: Payer: Self-pay | Admitting: Family Medicine

## 2022-04-14 ENCOUNTER — Other Ambulatory Visit: Payer: Self-pay

## 2022-04-14 DIAGNOSIS — G5601 Carpal tunnel syndrome, right upper limb: Secondary | ICD-10-CM

## 2022-04-14 DIAGNOSIS — Z419 Encounter for procedure for purposes other than remedying health state, unspecified: Secondary | ICD-10-CM | POA: Diagnosis not present

## 2022-04-14 MED ORDER — GABAPENTIN 600 MG PO TABS: 1200.0000 mg | ORAL_TABLET | Freq: Every day | ORAL | 2 refills | Status: DC

## 2022-04-14 MED ORDER — DICLOFENAC SODIUM 1 % EX GEL: 4.0000 g | Freq: Four times a day (QID) | CUTANEOUS | 0 refills | Status: DC

## 2022-04-18 ENCOUNTER — Other Ambulatory Visit: Payer: Self-pay | Admitting: *Deleted

## 2022-04-18 DIAGNOSIS — I10 Essential (primary) hypertension: Secondary | ICD-10-CM | POA: Diagnosis not present

## 2022-04-19 NOTE — Patient Outreach (Signed)
?Medicaid Managed Care   ?Nurse Care Manager Note ? ?04/19/2022 ?Name:  Sandra Lozano MRN:  [id number] DOB:  [birth year] ? ?Sandra Lozano is an 61 y.o. year old female who is a primary patient of Lowell, Strawn, DO.  The [facility] Managed Care Coordination team was consulted for assistance with:    ?HTN ?HLD ?Prediabetes ?Chronic Pain ? ?Sandra Lozano was given information about Medicaid Managed Care Coordination team services today. Sandra Lozano Patient agreed to services and verbal consent obtained. ? ?Engaged with patient by telephone for follow up visit in response to provider referral for case management and/or care coordination services.  ? ?Assessments/Interventions:  Review of past medical history, allergies, medications, health status, including review of consultants reports, laboratory and other test data, was performed as part of comprehensive evaluation and provision of chronic care management services. ? ?SDOH (Social Determinants of Health) assessments and interventions performed: ? ? ?Care Plan ? ?No Known Allergies ? ?Medications Reviewed Today   ? ? Reviewed by Barrington Ellison, RN (Registered Nurse) on 04/18/22 at 1153  Med List Status: <None>  ? ?Medication Order Taking? Sig Documenting Provider Last Dose Status Informant  ?Accu-Chek Softclix Lancets lancets [id number]  Use as instructed Autry-Lott, Simone, DO  Active   ?aspirin 325 MG EC tablet [id number] Yes Take 650 mg by mouth daily. Takes every morning [provider] Taking Active Self  ?atorvastatin (LIPITOR) 20 MG tablet [id number]  Take 1 tablet (20 mg total) by mouth every evening. [name], DO  Active   ?Blood Glucose Monitoring Suppl w/Device KIT [id number]  1 kit by Does not apply route daily. [name], DO  Active   ?Blood Pressure Monitoring (BLOOD PRESSURE CUFF) MISC [id number]  Check your blood pressure in the morning when you wake up, and at night before you go to bed Autry-Lott, Naaman Plummer, DO  Active   ?diclofenac  Sodium (VOLTAREN) 1 % GEL [id number]  Apply 4 g topically 4 (four) times daily. Autry-Lott, Naaman Plummer, DO  Active   ?gabapentin (NEURONTIN) 600 MG tablet [id number]  Take 2 tablets (1,200 mg total) by mouth at bedtime. Autry-Lott, Naaman Plummer, DO  Active   ?glucose blood test strip [id number]  Please use to check blood sugar once daily. Autry-Lott, Naaman Plummer, DO  Active   ?lisinopril-hydrochlorothiazide (ZESTORETIC) 20-12.5 MG tablet [id number]  Take 2 tablets by mouth daily. Autry-Lott, Naaman Plummer, DO  Active   ?meloxicam (MOBIC) 15 MG tablet [id number]  Take 1 tablet (15 mg total) by mouth daily. Autry-Lott, Naaman Plummer, DO  Active   ?metFORMIN (GLUCOPHAGE) 500 MG tablet [id number]  Take 1 tablet (500 mg total) by mouth daily with breakfast. Autry-Lott, Naaman Plummer, DO  Active   ?Omega-3 Fatty Acids (FISH OIL) 1000 MG CAPS [id number]  Take 1 capsule (1,000 mg total) by mouth at bedtime.  ?Patient not taking: Reported on 02/11/2022  ? Autry-Lott, Naaman Plummer, DO  Active   ?triamcinolone ointment (KENALOG) 0.1 % [id number]  Apply 1 application. topically 2 (two) times daily. Apply to scalp. Don't use more than 2 weeks in a row without physician approval. [name], DO  Active   ? ?  ?  ? ?  ? ? ?Patient Active Problem List  ? Diagnosis Date Noted  ? COPD, mild ([place]) 02/11/2022  ? Bilateral carpal tunnel syndrome 12/21/2021  ? Arthritis of finger of left hand 11/26/2021  ? Chest wall pain 04/20/2021  ? Hot flashes 04/20/2021  ? Traumatic arthritis of left foot   ? Skin lesion of scalp 06/04/2020  ? Arthritis  of midfoot 03/14/2020  ? History of lumbar fusion 03/14/2020  ? Neuropathy 11/07/2019  ? Pre-diabetes 11/07/2019  ? Nummular eczema 07/06/2019  ? Lumbar stenosis 05/20/2019  ? Acute left-sided low back pain without sciatica 07/18/2018  ? Osteoarthritis of right hip 06/03/2018  ? Chronic right shoulder pain 05/05/2018  ? Right hip pain 05/05/2018  ? Rupture of anterior cruciate ligament of right knee 02/15/2018  ? Sprain of medial collateral  ligament of right knee 02/15/2018  ? Essential hypertension 02/15/2018  ? ? ? ?Care Plan : RN Care Manager Plan Of Care  ?Updates made by Barrington Ellison, RN since 04/19/2022 12:00 AM  ?  ? ?Problem: Chronic Disease Management and Care Coordination Needs realted to HTN, prediabetes, and chronic pain   ?Priority: High  ?  ? ?Long-Range Goal: Development of Plan Of Care For Chronic Disease Management and Care Coordination Needs To Assist With Meeting Treatment Goals for HTN, prediabetes and chronic pain and HLD   ?Start Date: 08/05/2021  ?Expected End Date: 08/05/2022  ?Priority: High  ?Note:   ?Current Barriers:  ?Chronic Disease Management support and education needs related to HTN, prediabetes and chronic pain, HLD ?RNCM Clinical Goal(s):  ?Patient will verbalize understanding of plan for management of HTN , prediabetes and chronic pain through collaboration with RN Care manager, provider, and care team.  ? ?Interventions: ?Inter-disciplinary care team collaboration (see longitudinal plan of care) ?Evaluation of current treatment plan related to  self management and patient's adherence to plan as established by provider ?Advised patient that the role of managed Medicaid RNCM will transition to Lurena Joiner RN in June 2023 and follow up telephone appointment scheduled for 05/21/22 at 1:00 pm  ? ? ?Hypertension: (Status: Goal on track: NO.)Long Term- per patient >75% of BP readings at home, she attributes higher readings at provider's office due to pain ?Last practice recorded BP readings:  ?BP Readings from Last 3 Encounters:  ?02/14/22 (!) 160/91  ?02/05/22 (!) 142/81  ?12/20/21 126/80  ?      ?Most recent eGFR/CrCl:  ?Lab Results  ?Component Value Date  ? EGFR 100 05/10/2021  ?  No components found for: CRCL ? ?Evaluation of current treatment plan related to hypertension self management and patient's adherence to plan as established by provider;   ?Reviewed medications with patient and discussed importance of  compliance;  ?Assessed frequency of home BP monitoring and variance of home BP readings ?Assessed BP readings in provider's office via EMR review ?Discussed plans with patient for ongoing care management follow up and provided patient with direct contact information for care management team; ? ? ?Pre- Diabetes Interventions:  (Status:  Goal on track:  Yes.) Long Term Goal- patient states her home CBGs are meeting treatment targets ?Assessed patient's understanding of A1c goal:  <6.4% ?Reviewed medications with patient and discussed importance of medication adherence ?Counseled on importance of regular laboratory monitoring as prescribed ?Discussed plans with patient for ongoing care management follow up and provided patient with direct contact information for care management team ?Advised patient, providing education and rationale, to check cbg (every morning -fasting)  and record, calling PCP for findings outside established parameters ?Review of patient status, including review of consultants reports, relevant laboratory and other test results, and medications completed ?Lab Results  ?Component Value Date  ? HGBA1C 6.1 08/22/2020  ?  ?Lab Results  ?Component Value Date  ? CHOL 159 02/05/2022  ? HDL 57 02/05/2022  ? [place] 81 02/05/2022  ? TRIG 117 02/05/2022  ?  CHOLHDL 2.8 02/05/2022  ?  ?HLD- meeting treatment goals as per results above, takes statin as directed  ? ?Pain Interventions:  (Status:  Goal on track:  Yes.) Long Term Goal- patient states she recently had a flare of arthritis in the joints in her legs and called her provider for a refill on Voltaren gel and states it is helping, also says she is taking her Mobic as directed since she says her arthritic pain quickly returns if she misses any doses,  she completed OP OT for pain in right hand related to carpal tunnel syndrome on 04/09/22 and the pain is better and she is continuing with her HEP using the stress ball, says she now takes gabapentin 1  capsule once in the morning and once in the evening at hs as she is experiencing alopecia. Reports the pain in left foot from previous surgery; has improved since receiving steroid injection on 02/19/22 to treat Morton's n

## 2022-04-19 NOTE — Patient Instructions (Signed)
Visit Information ? ?Ms. Brisendine was given information about Medicaid Managed Care team care coordination services as a part of their [facility] Medicaid benefit. Neal Oshea verbally consented to engagement with the [facility] Managed Care team.  ? ?If you are experiencing a medical emergency, please call 911 or report to your local emergency department or urgent care.  ? ?If you have a non-emergency medical problem during routine business hours, please contact your provider's office and ask to speak with a nurse.  ? ?For questions related to your [facility] health plan, please call: [phone] or go here:[email or web address] ? ?If you would like to schedule transportation through your [facility] plan, please call the following number at least 2 days in advance of your appointment: [phone]. ? You can also use the MTM portal or MTM mobile app to manage your rides. For the portal, please go to [email or web address]. ? ?Call the [place] at [phone], at any time, 24 hours a day, 7 days a week. If you are in danger or need immediate medical attention call 911. ? ?If you would like help to quit smoking, call 1-800-QUIT-NOW 513-758-5647) OR Espa?ol: 1-855-D?jelo-Ya 9512971572) o para m?s informaci?n haga clic aqu? or Text READY to 200-400 to register via text ? ?Ms. Fickel - following are the goals we discussed in your visit today:  ? Goals Addressed   ?Patient will self administer medications as prescribed ?Patient will attend all scheduled provider appointments ?Patient will call pharmacy for medication refills ?Patient will continue to perform ADL's independently ?Patient will continue to perform IADL's independently ?Patient will call provider office for new concerns or questions ? ? ? ?Patient verbalizes understanding of instructions and care plan provided today and agrees to view in [place]. Active MyChart status confirmed with patient.   ? ?The Managed Medicaid  care management team will reach out to the patient again over the next 45 days.  ? ?Kelli Churn RN, CCM, CDCES ?[facility] ?Care Management Coordinator - Managed Medicaid High Risk ?[phone]  ? ?Following is a copy of your plan of care:  ?Care Plan : [place]  ?Updates made by Barrington Ellison, RN since 04/19/2022 12:00 AM  ?  ? ?Problem: Chronic Disease Management and Care Coordination Needs realted to HTN, prediabetes, and chronic pain   ?Priority: High  ?  ? ?Long-Range Goal: Development of Plan Of Care For Chronic Disease Management and Care Coordination Needs To Assist With Meeting Treatment Goals for HTN, prediabetes and chronic pain and HLD   ?Start Date: 08/05/2021  ?Expected End Date: 08/05/2022  ?Priority: High  ?Note:   ?Current Barriers:  ?Chronic Disease Management support and education needs related to HTN, prediabetes and chronic pain, HLD ?RNCM Clinical Goal(s):  ?Patient will verbalize understanding of plan for management of HTN , prediabetes and chronic pain through collaboration with RN Care manager, provider, and care team.  ? ?Interventions: ?Inter-disciplinary care team collaboration (see longitudinal plan of care) ?Evaluation of current treatment plan related to  self management and patient's adherence to plan as established by provider ?Advised patient that the role of managed Medicaid RNCM will transition to Lurena Joiner RN in June 2023 and follow up telephone appointment scheduled for 05/21/22 at 1:00 pm  ? ? ?Hypertension: (Status: Goal on track: NO.)Long Term- per patient >75% of BP readings at home, she attributes higher readings at provider's office due to pain ?Last practice recorded BP readings:  ?BP Readings from Last  3 Encounters:  ?02/14/22 (!) 160/91  ?02/05/22 (!) 142/81  ?12/20/21 126/80  ?      ?Most recent eGFR/CrCl:  ?Lab Results  ?Component Value Date  ? EGFR 100 05/10/2021  ?  No components found for: CRCL ? ?Evaluation of  current treatment plan related to hypertension self management and patient's adherence to plan as established by provider;   ?Reviewed medications with patient and discussed importance of compliance;  ?Assessed frequency of home BP monitoring and variance of home BP readings ?Assessed BP readings in provider's office via EMR review ?Discussed plans with patient for ongoing care management follow up and provided patient with direct contact information for care management team; ? ? ?Pre- Diabetes Interventions:  (Status:  Goal on track:  Yes.) Long Term Goal- patient states her home CBGs are meeting treatment targets ?Assessed patient's understanding of A1c goal:  <6.4% ?Reviewed medications with patient and discussed importance of medication adherence ?Counseled on importance of regular laboratory monitoring as prescribed ?Discussed plans with patient for ongoing care management follow up and provided patient with direct contact information for care management team ?Advised patient, providing education and rationale, to check cbg (every morning -fasting)  and record, calling PCP for findings outside established parameters ?Review of patient status, including review of consultants reports, relevant laboratory and other test results, and medications completed ?Lab Results  ?Component Value Date  ? HGBA1C 6.1 08/22/2020  ?  ?Lab Results  ?Component Value Date  ? CHOL 159 02/05/2022  ? HDL 57 02/05/2022  ? [place] 81 02/05/2022  ? TRIG 117 02/05/2022  ? CHOLHDL 2.8 02/05/2022  ?  ?HLD- meeting treatment goals as per results above, takes statin as directed  ? ?Pain Interventions:  (Status:  Goal on track:  Yes.) Long Term Goal- patient states she recently had a flare of arthritis in the joints in her legs and called her provider for a refill on Voltaren gel and states it is helping, also says she is taking her Mobic as directed since she says her arthritic pain quickly returns if she misses any doses,  she completed OP OT  for pain in right hand related to carpal tunnel syndrome on 04/09/22 and the pain is better and she is continuing with her HEP using the stress ball, says she now takes gabapentin 1 capsule once in the morning and once in the evening at hs as she is experiencing alopecia. Reports the pain in left foot from previous surgery; has improved since receiving steroid injection on 02/19/22 to treat Morton's neuroma, states she is no longer wearing the right wrist brace; she says she was told at one time that she has flat feet so she ordered arch supports from [employer] to see that if that will help foot pain and correct her gait ?Medications reviewed ?Reviewed provider established plan for pain management ?Discussed importance of adherence to all scheduled medical appointments ?Counseled on the importance of reporting any/all new or changed pain symptoms or management strategies to pain management provider ?Advised patient to report to care team affect of pain on daily activities ? ? ?Patient Goals/Self-Care Activities: ?Patient will self administer medications as prescribed ?Patient will attend all scheduled provider appointments ?Patient will call pharmacy for medication refills ?Patient will continue to perform ADL's independently ?Patient will continue to perform IADL's independently ?Patient will call provider office for new concerns or questions ? ?  ?  ?  ?

## 2022-04-30 ENCOUNTER — Other Ambulatory Visit: Payer: Self-pay | Admitting: Family Medicine

## 2022-04-30 ENCOUNTER — Ambulatory Visit (INDEPENDENT_AMBULATORY_CARE_PROVIDER_SITE_OTHER): Payer: Medicaid Other | Admitting: Family Medicine

## 2022-04-30 ENCOUNTER — Encounter: Payer: Self-pay | Admitting: Family Medicine

## 2022-04-30 VITALS — BP 130/70 | HR 69 | Temp 98.3°F | Ht 64.0 in | Wt 224.4 lb

## 2022-04-30 DIAGNOSIS — H9190 Unspecified hearing loss, unspecified ear: Secondary | ICD-10-CM | POA: Diagnosis not present

## 2022-04-30 DIAGNOSIS — M159 Polyosteoarthritis, unspecified: Secondary | ICD-10-CM

## 2022-04-30 DIAGNOSIS — J302 Other seasonal allergic rhinitis: Secondary | ICD-10-CM | POA: Diagnosis not present

## 2022-04-30 DIAGNOSIS — G5601 Carpal tunnel syndrome, right upper limb: Secondary | ICD-10-CM

## 2022-04-30 MED ORDER — FLUTICASONE PROPIONATE 50 MCG/ACT NA SUSP: 2.0000 | Freq: Every day | NASAL | 6 refills | Status: DC

## 2022-04-30 MED ORDER — CETIRIZINE HCL 10 MG PO TABS: 10.0000 mg | ORAL_TABLET | Freq: Every day | ORAL | 11 refills | Status: DC

## 2022-04-30 MED ORDER — ACETAMINOPHEN 500 MG PO TABS
500.0000 mg | ORAL_TABLET | Freq: Four times a day (QID) | ORAL | 0 refills | Status: DC | PRN
Start: 2022-04-30 — End: 2022-05-24

## 2022-04-30 NOTE — Progress Notes (Signed)
    SUBJECTIVE:   CHIEF COMPLAINT / HPI:   Hearing loss She states this has been going on for a while.  She is coming in today because she noticed she was unable to hear her granddaughter when she was talking on her right side.  She also states she often loses her balance.  Denies falls.  Has a history of hammertoes.  Does have follow-up soon with podiatry.  Does endorse allergy symptoms annually.  Her symptoms are nasal congestion, throat irritation, ear fullness.  Not currently taking any treatment for this.  Arthritis Desires continued meloxicam for arthritic pain.  Does not have a history of stomach ulcers at this time.  Pain seems to be well controlled with NSAID.  Has not attempted Tylenol arthritis.  PERTINENT  PMH / PSH: As above.  OBJECTIVE:   BP 130/70   Pulse 69   Temp 98.3 F (36.8 C)   Wt 224 lb 6.4 oz (101.8 kg)   LMP  (LMP Unknown)   SpO2 98%   BMI 38.52 kg/m   Physical Exam Vitals reviewed.  Constitutional:      General: She is not in acute distress.    Appearance: She is not ill-appearing, toxic-appearing or diaphoretic.  HENT:     Right Ear: Tympanic membrane, ear canal and external ear normal. There is no impacted cerumen.     Left Ear: Tympanic membrane, ear canal and external ear normal. There is no impacted cerumen.     Nose: Congestion present. No rhinorrhea.     Mouth/Throat:     Pharynx: No oropharyngeal exudate or posterior oropharyngeal erythema.  Eyes:     Conjunctiva/sclera: Conjunctivae normal.  Pulmonary:     Effort: Pulmonary effort is normal.  Musculoskeletal:     Cervical back: Neck supple.  Lymphadenopathy:     Cervical: No cervical adenopathy.  Neurological:     Mental Status: She is alert and oriented to person, place, and time.  Psychiatric:        Mood and Affect: Mood normal.        Behavior: Behavior normal.   ASSESSMENT/PLAN:   1. Hearing loss, unspecified hearing loss type, unspecified laterality Chronic.  Attempted  hearing screening in the clinic and failed. Structure of TM intact.  Likely age-related hearing loss.  Will refer to audiology at this time. - Ambulatory referral to Audiology  2. Seasonal allergies Endorsing symptoms head congestion and nasal congestion yearly without treatment.  We will start Flonase and Zyrtec.  3. Osteoarthritis of multiple joints, unspecified osteoarthritis type Chronically use of meloxicam.  Today discussed switching to Tylenol arthritis.  Patient plans to try and notify if pain unresolved.   Lavonda Jumbo, DO [facility]

## 2022-04-30 NOTE — Patient Instructions (Addendum)
It was wonderful to see you today. ? ?Please bring ALL of your medications with you to every visit.  ? ?Today we talked about: ? ?-I have sent in Tylenol arthritis, please let me know if this does not work for you ?- I am referring you to audiology for possible age-related hearing loss.  They will give you a call to get this scheduled. ?-Continue current medications for blood pressure. ? ?Please call the clinic at [phone] if your symptoms worsen or you have any concerns. It was our pleasure to serve you. ? ?Dr. Salvadore Dom ? ?

## 2022-05-05 ENCOUNTER — Other Ambulatory Visit: Payer: Self-pay

## 2022-05-05 DIAGNOSIS — R7303 Prediabetes: Secondary | ICD-10-CM

## 2022-05-05 MED ORDER — GLUCOSE BLOOD VI STRP: ORAL_STRIP | 6 refills | Status: DC

## 2022-05-05 MED ORDER — ACCU-CHEK SOFTCLIX LANCETS MISC: 12 refills | Status: DC

## 2022-05-07 ENCOUNTER — Ambulatory Visit: Payer: Medicaid Other | Attending: Rheumatology | Admitting: Audiologist

## 2022-05-07 DIAGNOSIS — R2689 Other abnormalities of gait and mobility: Secondary | ICD-10-CM | POA: Diagnosis not present

## 2022-05-07 DIAGNOSIS — H903 Sensorineural hearing loss, bilateral: Secondary | ICD-10-CM | POA: Insufficient documentation

## 2022-05-07 NOTE — Procedures (Signed)
  Outpatient Audiology and [facility] [address] [place], [place]  [address] [phone]  AUDIOLOGICAL  EVALUATION  NAME: Sandra Lozano     DOB:   1961-03-01      MRN: [id number]                                                                                     DATE: 05/07/2022     REFERENT: Lavonda Jumbo, DO STATUS: Outpatient DIAGNOSIS: Sensorineural Asymmetric Hearing Loss, Imbalance     History: [name] was seen for an audiological evaluation.  [name] is receiving a hearing evaluation due to concerns for difficulty hearing from the right ear and imbalance. [name] had difficulty hearing her granddaughter who was sitting on her right side. This difficulty began gradually. No pain or pressure reported in either ear today but she does have occasional pressure with allergies. The decongestant nose spray is helping. [name] denies ringing in the ears. [name] is often having to grab walls and doorways to keep her balance. She has had foot surgery on her left left foot.  Medical history positive for neuropathy which can be a risk factor for hearing loss. No other relevant case history reported.   Evaluation:  Otoscopy showed a clear view of the tympanic membranes, bilaterally Tympanometry results were consistent with normal middle ear function, bilaterally   Audiometric testing was completed using conventional audiometry with insert transducer. Speech Recognition Thresholds were consistent with pure tone averages, 25 dB in the right and 15 dB in the left. Word Recognition was 96% in the left ear and 100% in the eight ear at 40dB SL with masking. Pure tone thresholds show normal sloping to mild hearing loss in both ears. Test results are consistent with asymmetry 1.5-4kHz with right ear worse.   Results:  The test results were reviewed with [name]. Out of an abundance of caution, especially due to her imbalance, I recommend she see an Otolaryngologist to rule out any medical  cause of her asymmetric hearing loss. She has only a mild loss in each ear with the right ear slightly worse. She reported understanding and was given a printed copy of her audiogram.   Recommendations: 1.   Referral to ENT Physician necessary due to imbalance and asymmetric hearing loss.    31 minutes spent testing and counseling on results.   [name]  Audiologist, Au.D., CCC-A 05/07/2022  9:34 AM  Cc: Lavonda Jumbo, DO

## 2022-05-12 ENCOUNTER — Encounter: Payer: Self-pay | Admitting: Family Medicine

## 2022-05-12 ENCOUNTER — Other Ambulatory Visit: Payer: Self-pay | Admitting: Family Medicine

## 2022-05-12 DIAGNOSIS — E785 Hyperlipidemia, unspecified: Secondary | ICD-10-CM

## 2022-05-13 ENCOUNTER — Other Ambulatory Visit: Payer: Self-pay | Admitting: Family Medicine

## 2022-05-13 DIAGNOSIS — G5601 Carpal tunnel syndrome, right upper limb: Secondary | ICD-10-CM

## 2022-05-13 MED ORDER — MELOXICAM 15 MG PO TABS: 15.0000 mg | ORAL_TABLET | Freq: Every day | ORAL | 0 refills | Status: DC

## 2022-05-13 MED ORDER — ATORVASTATIN CALCIUM 20 MG PO TABS: 20.0000 mg | ORAL_TABLET | Freq: Every evening | ORAL | 0 refills | Status: DC

## 2022-05-13 MED ORDER — METFORMIN HCL 500 MG PO TABS: 500.0000 mg | ORAL_TABLET | Freq: Every day | ORAL | 0 refills | Status: DC

## 2022-05-15 ENCOUNTER — Other Ambulatory Visit: Payer: Self-pay | Admitting: Family Medicine

## 2022-05-15 DIAGNOSIS — Z419 Encounter for procedure for purposes other than remedying health state, unspecified: Secondary | ICD-10-CM | POA: Diagnosis not present

## 2022-05-15 DIAGNOSIS — I10 Essential (primary) hypertension: Secondary | ICD-10-CM

## 2022-05-20 ENCOUNTER — Encounter: Payer: Self-pay | Admitting: *Deleted

## 2022-05-21 ENCOUNTER — Encounter: Payer: Self-pay | Admitting: Family

## 2022-05-21 ENCOUNTER — Ambulatory Visit (INDEPENDENT_AMBULATORY_CARE_PROVIDER_SITE_OTHER): Payer: Medicaid Other

## 2022-05-21 ENCOUNTER — Ambulatory Visit (INDEPENDENT_AMBULATORY_CARE_PROVIDER_SITE_OTHER): Payer: Medicaid Other | Admitting: Podiatry

## 2022-05-21 ENCOUNTER — Other Ambulatory Visit: Payer: Self-pay | Admitting: *Deleted

## 2022-05-21 ENCOUNTER — Ambulatory Visit (INDEPENDENT_AMBULATORY_CARE_PROVIDER_SITE_OTHER): Payer: Medicaid Other | Admitting: Family

## 2022-05-21 DIAGNOSIS — M25562 Pain in left knee: Secondary | ICD-10-CM

## 2022-05-21 DIAGNOSIS — S99922A Unspecified injury of left foot, initial encounter: Secondary | ICD-10-CM

## 2022-05-21 DIAGNOSIS — S92515A Nondisplaced fracture of proximal phalanx of left lesser toe(s), initial encounter for closed fracture: Secondary | ICD-10-CM

## 2022-05-21 DIAGNOSIS — M79674 Pain in right toe(s): Secondary | ICD-10-CM

## 2022-05-21 DIAGNOSIS — M79675 Pain in left toe(s): Secondary | ICD-10-CM

## 2022-05-21 DIAGNOSIS — M1712 Unilateral primary osteoarthritis, left knee: Secondary | ICD-10-CM

## 2022-05-21 DIAGNOSIS — M7672 Peroneal tendinitis, left leg: Secondary | ICD-10-CM

## 2022-05-21 DIAGNOSIS — S92505A Nondisplaced unspecified fracture of left lesser toe(s), initial encounter for closed fracture: Secondary | ICD-10-CM | POA: Diagnosis not present

## 2022-05-21 DIAGNOSIS — M79676 Pain in unspecified toe(s): Secondary | ICD-10-CM

## 2022-05-21 DIAGNOSIS — B351 Tinea unguium: Secondary | ICD-10-CM

## 2022-05-21 MED ORDER — BETAMETHASONE SOD PHOS & ACET 6 (3-3) MG/ML IJ SUSP: 3.0000 mg | Freq: Once | INTRAMUSCULAR | Status: DC

## 2022-05-21 NOTE — Patient Instructions (Addendum)
Visit Information  Sandra Lozano was given information about Medicaid Managed Care team care coordination services as a part of their [facility] Medicaid benefit. Sandra Lozano verbally consented to engagement with the [facility] Managed Care team.   If you are experiencing a medical emergency, please call 911 or report to your local emergency department or urgent care.   If you have a non-emergency medical problem during routine business hours, please contact your provider's office and ask to speak with a nurse.   For questions related to your [facility] health plan, please call: [phone] or go here:[email or web address]  If you would like to schedule transportation through your [facility] plan, please call the following number at least 2 days in advance of your appointment: [phone].  You can also use the MTM portal or MTM mobile app to manage your rides. For the portal, please go to [email or web address].  Call the [place] at [phone], at any time, 24 hours a day, 7 days a week. If you are in danger or need immediate medical attention call 911.  If you would like help to quit smoking, call 1-800-QUIT-NOW 831 029 5585) OR Espaol: 1-855-Djelo-Ya (0-802-233-6122) o para ms informacin haga clic aqu or Text READY to 200-400 to register via text  Sandra Lozano,   Please see education materials related to HTN provided by MyChart link.  Patient verbalizes understanding of instructions and care plan provided today and agrees to view in [place]. Active MyChart status and patient understanding of how to access instructions and care plan via MyChart confirmed with patient.     Telephone follow up appointment with Managed Medicaid care management team member scheduled for:07/02/22 @ [place] RN, [place] RN Care Coordinator   Following is a copy of your plan of care:  Care Plan : [place]   Updates made by Melissa Montane, RN since 05/21/2022 12:00 AM     Problem: Chronic Disease Management and Care Coordination Needs realted to HTN, prediabetes, and chronic pain   Priority: High     Long-Range Goal: Development of Plan Of Care For Chronic Disease Management and Care Coordination Needs To Assist With Meeting Treatment Goals for HTN, prediabetes and chronic pain and HLD   Start Date: 08/05/2021  Expected End Date: 08/05/2022  Priority: High  Note:   Current Barriers:  Chronic Disease Management support and education needs related to HTN, prediabetes and chronic pain, HLD RNCM Clinical Goal(s):  Patient will verbalize understanding of plan for management of HTN , prediabetes and chronic pain through collaboration with RN Care manager, provider, and care team.   Interventions: Inter-disciplinary care team collaboration (see longitudinal plan of care) Evaluation of current treatment plan related to  self management and patient's adherence to plan as established by provider Collaborate with PCP for Dermatology referral Provide patient with list of Dermatologist accepting [facility] Medicaid   Hypertension: (Status: Goal on track: NO.)Long Term- per patient >75% of BP readings at home, she attributes higher readings at provider's office due to pain Last practice recorded BP readings:  BP Readings from Last 3 Encounters:  04/30/22 130/70  02/14/22 (!) 160/91  02/05/22 (!) 142/81        Most recent eGFR/CrCl:  Lab Results  Component Value Date   EGFR 99 10/30/2021    No components found for: CRCL  Evaluation of current treatment plan related to hypertension self management and patient's adherence to plan as established by provider;  Reviewed medications with patient and discussed importance of compliance;  Assessed frequency of home BP monitoring and variance of home BP readings Assessed BP readings in provider's office via EMR review Discussed plans with patient for  ongoing care management follow up and provided patient with direct contact information for care management team; Provide patient with HTN education via MyChart   Pre- Diabetes Interventions:  (Status:  Goal on track:  Yes.) Long Term Goal- patient states her home CBGs are meeting treatment targets Assessed patient's understanding of A1c goal:  <6.4% Reviewed medications with patient and discussed importance of medication adherence Counseled on importance of regular laboratory monitoring as prescribed Discussed plans with patient for ongoing care management follow up and provided patient with direct contact information for care management team Advised patient, providing education and rationale, to check cbg (every morning -fasting)  and record, calling PCP for findings outside established parameters Review of patient status, including review of consultants reports, relevant laboratory and other test results, and medications completed Lab Results  Component Value Date   HGBA1C 6.1 08/22/2020    Lab Results  Component Value Date   CHOL 159 02/05/2022   HDL 57 02/05/2022   Pace 81 02/05/2022   TRIG 117 02/05/2022   CHOLHDL 2.8 02/05/2022    HLD- meeting treatment goals as per results above, takes statin as directed   Pain Interventions:  (Status:  Goal on track:  Yes.) Long Term Goal- Patient seen by Podiatrist today. Foot feels better since receiving injection. She had a carpal tunnel flare up and reports improvement with wearing brace. Medications reviewed Reviewed provider established plan for pain management Discussed importance of adherence to all scheduled medical appointments Counseled on the importance of reporting any/all new or changed pain symptoms or management strategies to pain management provider Advised patient to report to care team affect of pain on daily activities   Patient Goals/Self-Care Activities: Patient will self administer medications as prescribed Patient  will attend all scheduled provider appointments Patient will call pharmacy for medication refills Patient will continue to perform ADL's independently Patient will continue to perform IADL's independently Patient will call provider office for new concerns or questions       Dermatologist Accepting [facility] Bumpers [facility]  Provider ID [phone] [address] [place], [place], [address] [phone]    Lavonna Monarch MD  Provider ID [phone] [address] [place], [place], [address] [phone]    Mickel Fuchs MD  Provider ID [phone] [place], [place], [address] [phone]

## 2022-05-21 NOTE — Progress Notes (Signed)
Office Visit Note   Patient: Sandra Lozano           Date of Birth: 04-17-1961           MRN: [id number] Visit Date: 05/21/2022              Requested by: [name], [place],  [place] [address] PCP: [name], DO  Chief Complaint  Patient presents with   Left Knee - Pain      HPI: The patient is a 61 year old woman who comes in today complaining of a 6-week history of left knee pain this has been gradually worsening she denies any associated injury no associated swelling or redness or warmth she is having medial joint line pain pain behind the kneecap complaining of some giving way pain aggravated by weightbearing  Assessment & Plan: Visit Diagnoses:  1. Acute pain of left knee     Plan: Depo-Medrol injection left knee.  Patient tolerated well.  We will follow-up in 4 weeks as needed.  Follow-Up Instructions: No follow-ups on file.   Left Knee Exam   Muscle Strength  The patient has normal left knee strength.  Tenderness  The patient is experiencing tenderness in the medial joint line and lateral joint line.  Range of Motion  The patient has normal left knee ROM.  Tests  Varus: negative Valgus: negative  Other  Erythema: absent Effusion: no effusion present     Patient is alert, oriented, no adenopathy, well-dressed, normal affect, normal respiratory effort.   Imaging: No results found. No images are attached to the encounter.  Labs: Lab Results  Component Value Date   HGBA1C 6.1 08/22/2020   HGBA1C 6.3 11/01/2019   ESRSEDRATE 11 11/26/2021   LABURIC 5.2 11/26/2021     Lab Results  Component Value Date   ALBUMIN 4.0 05/18/2019    No results found for: MG No results found for: VD25OH  No results found for: PREALBUMIN    Latest Ref Rng & Units 11/02/2020    8:05 AM 05/21/2019    2:10 AM 05/18/2019   10:02 AM  CBC EXTENDED  WBC 4.0 - 10.5 K/uL  11.4   10.5    RBC 3.87 - 5.11 MIL/uL  3.35   4.78     Hemoglobin 12.0 - 15.0 g/dL 14.6   9.3   13.3    HCT 36.0 - 46.0 % 43.0   28.1   41.2    Platelets 150 - 400 K/uL  284   356       There is no height or weight on file to calculate BMI.  Orders:  Orders Placed This Encounter  Procedures   XR Knee 1-2 Views Left   No orders of the defined types were placed in this encounter.    Procedures: Large Joint Inj: L knee on 05/23/2022 8:41 AM Indications: pain Details: 18 G 1.5 in needle, anteromedial approach Medications: 5 mL lidocaine 1 %; 40 mg methylPREDNISolone acetate 40 MG/ML Consent was given by the patient.      Clinical Data: No additional findings.  ROS:  All other systems negative, except as noted in the HPI. Review of Systems  Constitutional:  Negative for chills and fever.  Musculoskeletal:  Positive for arthralgias and gait problem. Negative for joint swelling.   Objective: Vital Signs: LMP  (LMP Unknown)   Specialty Comments:  No specialty comments available.  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   Bilateral carpal tunnel syndrome 12/21/2021   Arthritis of finger of left hand 11/26/2021   Chest wall pain 04/20/2021   Hot flashes 04/20/2021   Traumatic arthritis of left foot    Skin lesion of scalp 06/04/2020   Arthritis of midfoot 03/14/2020   History of lumbar fusion 03/14/2020   Neuropathy 11/07/2019   Pre-diabetes 11/07/2019   Nummular eczema 07/06/2019   Lumbar stenosis 05/20/2019   Acute left-sided low back pain without sciatica 07/18/2018   Osteoarthritis of right hip 06/03/2018   Chronic right shoulder pain 05/05/2018   Right hip pain 05/05/2018   Rupture of anterior cruciate ligament of right knee 02/15/2018   Sprain of medial collateral ligament of right knee 02/15/2018   Essential hypertension 02/15/2018   Past Medical History:  Diagnosis Date   Arthritis    right knee, lower back   Dyspnea    very rare -tx with albuterol  neb sol if needed   Hyperlipidemia    Hypertension    Pre-diabetes    per patient - does not check blood sugar   SVD (spontaneous vaginal delivery)    x 5 - only 2 living, 1 stillborn and 1 premature at 7 months demise,1 child deceased   Wears dentures    upper and lower    Family History  Problem Relation Age of Onset   Cancer Sister    Healthy Daughter    Healthy Daughter     Past Surgical History:  Procedure Laterality Date   FOOT ARTHRODESIS Left 11/02/2020   Procedure: LEFT MIDFOOT FUSION BASE 1ST AND 2ND METATARSAL;  Surgeon: Newt Minion, MD;  Location: [place];  Service: Orthopedics;  Laterality: Left;   LUMBAR FUSION  05/20/2019   GILL PROCEDURE, LEFT TRANSFORAMINAL LUMBAR INTERBODY FUSION, PEDICLE INSTRUMENTATION, BILATERAL FUSION (N/A   TUBAL LIGATION     Social History   Occupational History   Not on file  Tobacco Use   Smoking status: Former    Packs/day: 1.00    Years: 30.00    Pack years: 30.00    Types: Cigarettes    Quit date: 01/2015    Years since quitting: 7.3    Passive exposure: Past   Smokeless tobacco: Never  Vaping Use   Vaping Use: Never used  Substance and Sexual Activity   Alcohol use: Not Currently   Drug use: Not Currently    Types: Marijuana    Comment: stopped 12/15/2021   Sexual activity: Not on file

## 2022-05-21 NOTE — Patient Outreach (Signed)
Medicaid Managed Care   Nurse Care Manager Note  05/21/2022 Name:  Sandra Lozano MRN:  [id number] DOB:  01-Apr-1961  Sandra Lozano is an 61 y.o. year old female who is a primary patient of [place], [place], DO.  The [facility] Managed Care Coordination team was consulted for assistance with:    HTN Pain  Pre diabetes  Sandra Lozano was given information about Medicaid Managed Care Coordination team services today. Sandra Lozano Patient agreed to services and verbal consent obtained.  Engaged with patient by telephone for follow up visit in response to provider referral for case management and/or care coordination services.   Assessments/Interventions:  Review of past medical history, allergies, medications, health status, including review of consultants reports, laboratory and other test data, was performed as part of comprehensive evaluation and provision of chronic care management services.  SDOH (Social Determinants of Health) assessments and interventions performed:   Care Plan  No Known Allergies  Medications Reviewed Today     Reviewed by Melissa Montane, RN (Registered Nurse) on 05/21/22 at 1313  Med List Status: <None>   Medication Order Taking? Sig Documenting Provider Last Dose Status Informant  Accu-Chek Softclix Lancets lancets [id number] Yes Use to check blood sugar once a day Autry-Lott, Simone, DO Taking Active   acetaminophen (TYLENOL) 500 MG tablet [id number] Yes Take 1 tablet (500 mg total) by mouth every 6 (six) hours as needed. [name], DO Taking Active   aspirin EC 325 MG tablet [id number] Yes Take 650 mg by mouth daily. Takes every morning [provider] Taking Active Self  atorvastatin (LIPITOR) 20 MG tablet [id number] Yes Take 1 tablet (20 mg total) by mouth every evening. Autry-Lott, Naaman Plummer, DO Taking Active   betamethasone acetate-betamethasone sodium phosphate (CELESTONE) injection 3 mg [id number]   Edrick Kins, DPM  Active   Blood Glucose  Monitoring Suppl w/Device KIT [id number] Yes 1 kit by Does not apply route daily. [name], DO Taking Active   Blood Pressure Monitoring (BLOOD PRESSURE CUFF) MISC [id number] Yes Check your blood pressure in the morning when you wake up, and at night before you go to bed Autry-Lott, Naaman Plummer, DO Taking Active   cetirizine (ZYRTEC) 10 MG tablet [id number] Yes Take 1 tablet (10 mg total) by mouth daily. Autry-Lott, Naaman Plummer, DO Taking Active   diclofenac Sodium (VOLTAREN) 1 % GEL [id number] Yes Apply 4 g topically 4 (four) times daily. Autry-Lott, Naaman Plummer, DO Taking Active   fluticasone (FLONASE) 50 MCG/ACT nasal spray [id number] Yes Place 2 sprays into both nostrils daily. Autry-Lott, Naaman Plummer, DO Taking Active   gabapentin (NEURONTIN) 600 MG tablet [id number] Yes Take 2 tablets (1,200 mg total) by mouth at bedtime.  Patient taking differently: Take 600 mg by mouth 2 (two) times daily. Patient states she takes in the morning and at bedtime   Autry-Lott, Naaman Plummer, DO Taking Active Self           Med Note (Tamario Heal A   Wed May 21, 2022  1:11 PM) Taking differently, taking one 600 mg tablet at bedtime  glucose blood test strip [id number] Yes Please use to check blood sugar once daily. Autry-Lott, Naaman Plummer, DO Taking Active   lisinopril-hydrochlorothiazide (ZESTORETIC) 20-12.5 MG tablet [id number] Yes TAKE 2 TABLETS BY MOUTH DAILY. Ezequiel Essex, MD Taking Active   meloxicam [facility]) 15 MG tablet [id number] Yes Take 1 tablet (15 mg total) by mouth daily. [name], DO Taking Active   metFORMIN (GLUCOPHAGE) 500 MG tablet [id number] Yes Take 1 tablet (500 mg total)  by mouth daily with breakfast. Autry-Lott, Naaman Plummer, DO Taking Active   Omega-3 Fatty Acids (FISH OIL) 1000 MG CAPS [id number] Yes Take 1 capsule (1,000 mg total) by mouth at bedtime. Autry-Lott, Naaman Plummer, DO Taking Active   triamcinolone ointment (KENALOG) 0.1 % [id number] Yes Apply 1 application. topically 2 (two) times daily. Apply to scalp. Don't  use more than 2 weeks in a row without physician approval. 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   Bilateral carpal tunnel syndrome 12/21/2021   Arthritis of finger of left hand 11/26/2021   Chest wall pain 04/20/2021   Hot flashes 04/20/2021   Traumatic arthritis of left foot    Skin lesion of scalp 06/04/2020   Arthritis of midfoot 03/14/2020   History of lumbar fusion 03/14/2020   Neuropathy 11/07/2019   Pre-diabetes 11/07/2019   Nummular eczema 07/06/2019   Lumbar stenosis 05/20/2019   Acute left-sided low back pain without sciatica 07/18/2018   Osteoarthritis of right hip 06/03/2018   Chronic right shoulder pain 05/05/2018   Right hip pain 05/05/2018   Rupture of anterior cruciate ligament of right knee 02/15/2018   Sprain of medial collateral ligament of right knee 02/15/2018   Essential hypertension 02/15/2018    Conditions to be addressed/monitored per PCP order:  HTN and Pain and pre diabetes  Care Plan : [place]  Updates made by Melissa Montane, RN since 05/21/2022 12:00 AM     Problem: Chronic Disease Management and Care Coordination Needs realted to HTN, prediabetes, and chronic pain   Priority: High     Long-Range Goal: Development of Plan Of Care For Chronic Disease Management and Care Coordination Needs To Assist With Meeting Treatment Goals for HTN, prediabetes and chronic pain and HLD   Start Date: 08/05/2021  Expected End Date: 08/05/2022  Priority: High  Note:   Current Barriers:  Chronic Disease Management support and education needs related to HTN, prediabetes and chronic pain, HLD RNCM Clinical Goal(s):  Patient will verbalize understanding of plan for management of HTN , prediabetes and chronic pain through collaboration with RN Care manager, provider, and care team.   Interventions: Inter-disciplinary care team collaboration (see  longitudinal plan of care) Evaluation of current treatment plan related to  self management and patient's adherence to plan as established by provider Collaborate with PCP for Dermatology referral Provide patient with list of Dermatologist accepting [facility] Medicaid   Hypertension: (Status: Goal on track: NO.)Long Term- per patient >75% of BP readings at home, she attributes higher readings at provider's office due to pain Last practice recorded BP readings:  BP Readings from Last 3 Encounters:  04/30/22 130/70  02/14/22 (!) 160/91  02/05/22 (!) 142/81        Most recent eGFR/CrCl:  Lab Results  Component Value Date   EGFR 99 10/30/2021    No components found for: CRCL  Evaluation of current treatment plan related to hypertension self management and patient's adherence to plan as established by provider;   Reviewed medications with patient and discussed importance of compliance;  Assessed frequency of home BP monitoring and variance of home BP readings Assessed BP readings in provider's office via EMR review Discussed plans with patient for ongoing care management follow up and provided patient with direct contact information for care management team; Provide patient with HTN education via MyChart  Pre- Diabetes Interventions:  (Status:  Goal on track:  Yes.) Long Term Goal- patient states her home CBGs are meeting treatment targets Assessed patient's understanding of A1c goal:  <6.4% Reviewed medications with patient and discussed importance of medication adherence Counseled on importance of regular laboratory monitoring as prescribed Discussed plans with patient for ongoing care management follow up and provided patient with direct contact information for care management team Advised patient, providing education and rationale, to check cbg (every morning -fasting)  and record, calling PCP for findings outside established parameters Review of patient status, including review of  consultants reports, relevant laboratory and other test results, and medications completed Lab Results  Component Value Date   HGBA1C 6.1 08/22/2020    Lab Results  Component Value Date   CHOL 159 02/05/2022   HDL 57 02/05/2022   [place] 81 02/05/2022   TRIG 117 02/05/2022   CHOLHDL 2.8 02/05/2022    HLD- meeting treatment goals as per results above, takes statin as directed   Pain Interventions:  (Status:  Goal on track:  Yes.) Long Term Goal- Patient seen by Podiatrist today. Foot feels better since receiving injection. She had a carpal tunnel flare up and reports improvement with wearing brace. Medications reviewed Reviewed provider established plan for pain management Discussed importance of adherence to all scheduled medical appointments Counseled on the importance of reporting any/all new or changed pain symptoms or management strategies to pain management provider Advised patient to report to care team affect of pain on daily activities   Patient Goals/Self-Care Activities: Patient will self administer medications as prescribed Patient will attend all scheduled provider appointments Patient will call pharmacy for medication refills Patient will continue to perform ADL's independently Patient will continue to perform IADL's independently Patient will call provider office for new concerns or questions        Follow Up:  Patient agrees to Care Plan and Follow-up.  Plan: The Managed Medicaid care management team will reach out to the patient again over the next 45 days.  Date/time of next scheduled RN care management/care coordination outreach:  07/02/22 @ [place] RN, [place] RN Care Coordinator

## 2022-05-21 NOTE — Progress Notes (Signed)
SUBJECTIVE Patient presents to office today complaining of elongated, thickened nails that cause pain while ambulating in shoes.  Patient is unable to trim their own nails.   Patient also states that a few months ago she sustained an injury to the third digit left foot.  She says she broke her toe in the past.  She is concerned for broken toe today.    Finally the patient also states that she has been experiencing pain to the lateral aspect of the left foot for the past few months.  She would like to have it evaluated.  She denies a history of injury.  She has not done anything for treatment.  Patient is here for further evaluation and treatment.  Past Medical History:  Diagnosis Date   Arthritis    right knee, lower back   Dyspnea    very rare -tx with albuterol neb sol if needed   Hyperlipidemia    Hypertension    Pre-diabetes    per patient - does not check blood sugar   SVD (spontaneous vaginal delivery)    x 5 - only 2 living, 1 stillborn and 1 premature at 7 months demise,1 child deceased   Wears dentures    upper and lower    OBJECTIVE General Patient is awake, alert, and oriented x 3 and in no acute distress. Derm Skin is dry and supple bilateral. Negative open lesions or macerations. Remaining integument unremarkable. Nails are tender, long, thickened and dystrophic with subungual debris, consistent with onychomycosis, 1-5 bilateral. No signs of infection noted. Vasc  DP and PT pedal pulses palpable bilaterally. Temperature gradient within normal limits.  Neuro Epicritic and protective threshold sensation grossly intact bilaterally.  Musculoskeletal Exam No symptomatic pedal deformities noted bilateral. Muscular strength within normal limits.  There is some pain on palpation and tenderness around the fifth metatarsal tubercle where the peroneal tendon inserts.  There is also some sensitivity around the fracture of the third toe left Radiographic exam LT foot closed  nondisplaced fracture noted intra-articular into the PIPJ of the head of the proximal phalanx third digit left foot.  Orthopedic hardware also noted to the first TMT joint which appears stable and intact.  ASSESSMENT 1.  Pain due to onychomycosis of toenails both 2.  Insertional peroneal tendinitis left 3.  Fracture head of the proximal phalanx third digit left, closed, nondisplaced, initial encounter  PLAN OF CARE 1. Patient evaluated today.  2. Instructed to maintain good pedal hygiene and foot care.  3. Mechanical debridement of nails 1-5 bilaterally performed using a nail nipper. Filed with dremel without incident.  4.  Injection of 0.5 cc Celestone Soluspan injected along the peroneal tendon as it inserts on the fifth metatarsal tubercle 5.  In regards to the fracture of the third toe, continue buddy splinting.  She says that this does help alleviate some of the pain.  We will simply observe for now.  Continue wearing good supportive shoes and sneakers that do not irritate the toes 6.  Return to clinic in 3 mos. for follow-up x-ray left foot   Felecia Shelling, DPM [facility]  Dr. Felecia Shelling, DPM    [address], [place] [address]  Office [phone]  Fax [phone]

## 2022-05-22 ENCOUNTER — Other Ambulatory Visit: Payer: Self-pay | Admitting: Family Medicine

## 2022-05-22 DIAGNOSIS — L989 Disorder of the skin and subcutaneous tissue, unspecified: Secondary | ICD-10-CM

## 2022-05-23 DIAGNOSIS — M1712 Unilateral primary osteoarthritis, left knee: Secondary | ICD-10-CM | POA: Diagnosis not present

## 2022-05-23 DIAGNOSIS — M25562 Pain in left knee: Secondary | ICD-10-CM | POA: Diagnosis not present

## 2022-05-23 MED ORDER — LIDOCAINE HCL 1 % IJ SOLN
5.0000 mL | INTRAMUSCULAR | Status: AC | PRN
  Administered 2022-05-23: 5 mL

## 2022-05-23 MED ORDER — METHYLPREDNISOLONE ACETATE 40 MG/ML IJ SUSP
40.0000 mg | INTRAMUSCULAR | Status: AC | PRN
  Administered 2022-05-23: 40 mg via INTRA_ARTICULAR

## 2022-05-24 ENCOUNTER — Other Ambulatory Visit: Payer: Self-pay | Admitting: Family Medicine

## 2022-05-26 MED ORDER — ACETAMINOPHEN 500 MG PO TABS: 500.0000 mg | ORAL_TABLET | Freq: Four times a day (QID) | ORAL | 0 refills | Status: DC | PRN

## 2022-06-13 ENCOUNTER — Other Ambulatory Visit: Payer: Self-pay | Admitting: Family Medicine

## 2022-06-13 DIAGNOSIS — E785 Hyperlipidemia, unspecified: Secondary | ICD-10-CM

## 2022-06-13 DIAGNOSIS — H5213 Myopia, bilateral: Secondary | ICD-10-CM | POA: Diagnosis not present

## 2022-06-13 DIAGNOSIS — G5601 Carpal tunnel syndrome, right upper limb: Secondary | ICD-10-CM

## 2022-06-13 MED ORDER — MELOXICAM 15 MG PO TABS: 15.0000 mg | ORAL_TABLET | Freq: Every day | ORAL | 0 refills | Status: DC

## 2022-06-13 MED ORDER — DICLOFENAC SODIUM 1 % EX GEL
4.0000 g | Freq: Four times a day (QID) | CUTANEOUS | 0 refills | Status: DC
Start: 2022-06-13 — End: 2022-06-30

## 2022-06-14 DIAGNOSIS — Z419 Encounter for procedure for purposes other than remedying health state, unspecified: Secondary | ICD-10-CM | POA: Diagnosis not present

## 2022-06-18 ENCOUNTER — Other Ambulatory Visit: Payer: Self-pay | Admitting: Family Medicine

## 2022-06-18 DIAGNOSIS — G5601 Carpal tunnel syndrome, right upper limb: Secondary | ICD-10-CM

## 2022-06-18 MED ORDER — METFORMIN HCL 500 MG PO TABS: 500.0000 mg | ORAL_TABLET | Freq: Every day | ORAL | 0 refills | Status: DC

## 2022-06-18 MED ORDER — ATORVASTATIN CALCIUM 20 MG PO TABS
20.0000 mg | ORAL_TABLET | Freq: Every evening | ORAL | 0 refills | Status: DC
Start: 2022-06-18 — End: 2022-08-20

## 2022-06-18 NOTE — Telephone Encounter (Signed)
Take one tablet at bedtime daily.  Continue wrist braces at night.  Come in for visit if pain worsens.

## 2022-06-21 ENCOUNTER — Other Ambulatory Visit: Payer: Self-pay | Admitting: Family Medicine

## 2022-06-23 ENCOUNTER — Encounter: Payer: Self-pay | Admitting: Podiatry

## 2022-06-30 ENCOUNTER — Other Ambulatory Visit: Payer: Self-pay | Admitting: Family Medicine

## 2022-06-30 MED ORDER — DICLOFENAC SODIUM 1 % EX GEL: 4.0000 g | Freq: Four times a day (QID) | CUTANEOUS | 0 refills | Status: DC

## 2022-06-30 MED ORDER — TRIAMCINOLONE ACETONIDE 0.1 % EX OINT: 1.0000 | TOPICAL_OINTMENT | Freq: Two times a day (BID) | CUTANEOUS | 0 refills | Status: DC

## 2022-07-02 ENCOUNTER — Other Ambulatory Visit: Payer: Self-pay | Admitting: *Deleted

## 2022-07-02 NOTE — Patient Instructions (Signed)
Visit Information  Ms. Kuang was given information about Medicaid Managed Care team care coordination services as a part of their [facility] Medicaid benefit. Sanvika Cuttino verbally consented to engagement with the [facility] Managed Care team.   If you are experiencing a medical emergency, please call 911 or report to your local emergency department or urgent care.   If you have a non-emergency medical problem during routine business hours, please contact your provider's office and ask to speak with a nurse.   For questions related to your [facility] health plan, please call: [phone] or go here:[email or web address]  If you would like to schedule transportation through your [facility] plan, please call the following number at least 2 days in advance of your appointment: [phone].  You can also use the MTM portal or MTM mobile app to manage your rides. For the portal, please go to [email or web address].  Call the [place] at [phone], at any time, 24 hours a day, 7 days a week. If you are in danger or need immediate medical attention call 911.  If you would like help to quit smoking, call 1-800-QUIT-NOW 7342051887) OR Espaol: 1-855-Djelo-Ya (4-765-465-0354) o para ms informacin haga clic aqu or Text READY to 200-400 to register via text  Ms. Hettinger,   Please see education materials related to HTN and Pain provided by MyChart link.  Patient verbalizes understanding of instructions and care plan provided today and agrees to view in [place]. Active MyChart status and patient understanding of how to access instructions and care plan via MyChart confirmed with patient.     Telephone follow up appointment with Managed Medicaid care management team member scheduled for:08/06/22 @ [place] RN, [place] RN Care Coordinator   Following is a copy of your plan of care:  Care Plan : [place]  Updates made by Melissa Montane, RN since 07/02/2022 12:00 AM     Problem: Chronic Disease Management and Care Coordination Needs realted to HTN, prediabetes, and chronic pain   Priority: High     Long-Range Goal: Development of Plan Of Care For Chronic Disease Management and Care Coordination Needs To Assist With Meeting Treatment Goals for HTN, prediabetes and chronic pain and HLD   Start Date: 08/05/2021  Expected End Date: 08/05/2022  Priority: High  Note:   Current Barriers:  Chronic Disease Management support and education needs related to HTN, prediabetes and chronic pain, HLD RNCM Clinical Goal(s):  Patient will verbalize understanding of plan for management of HTN , prediabetes and chronic pain through collaboration with RN Care manager, provider, and care team.   Interventions: Inter-disciplinary care team collaboration (see longitudinal plan of care) Evaluation of current treatment plan related to  self management and patient's adherence to plan as established by provider Discussed Dermatology referral, patient has appointment on 08/22/22    Hypertension: (Status: Goal on Track (progressing): YES.)Long Term- per patient >75% of BP readings at home, she attributes higher readings at provider's office due to pain. BP at home today 128/70 Last practice recorded BP readings:  BP Readings from Last 3 Encounters:  04/30/22 130/70  02/14/22 (!) 160/91  02/05/22 (!) 142/81        Most recent eGFR/CrCl:  Lab Results  Component Value Date   EGFR 99 10/30/2021    No components found for: "CRCL"  Evaluation of current treatment plan related to hypertension self management and patient's adherence to plan as established  by provider;   Reviewed medications with patient and discussed importance of compliance;  Assessed frequency of home BP monitoring and variance of home BP readings Advised patient to take her BP monitor to her next office appointment to check  accuracy Assessed BP readings in provider's office via EMR review Discussed plans with patient for ongoing care management follow up and provided patient with direct contact information for care management team;   Pre- Diabetes Interventions:  (Status:  Goal on track:  Yes.) Long Term Goal- patient states her home CBGs are meeting treatment targets-Fasting BS today 110 Assessed patient's understanding of A1c goal:  <6.4% Reviewed medications with patient and discussed importance of medication adherence Counseled on importance of regular laboratory monitoring as prescribed Advised patient, providing education and rationale, to check cbg (every morning -fasting)  and record, calling PCP for findings outside established parameters Review of patient status, including review of consultants reports, relevant laboratory and other test results, and medications completed Lab Results  Component Value Date   HGBA1C 6.1 08/22/2020    Lab Results  Component Value Date   CHOL 159 02/05/2022   HDL 57 02/05/2022   [place] 81 02/05/2022   TRIG 117 02/05/2022   CHOLHDL 2.8 02/05/2022    HLD- meeting treatment goals as per results above, takes statin as directed   Pain Interventions:  (Status:  Goal on track:  Yes.) Long Term Goal- Patient with increased left knee pain, tingling both knees. Reports falling 3 times since receiving steroid injection in left knee. Feels like her balance is off. Medications reviewed, discussed taking meloxicam in the morning instead of at night Reviewed provider established plan for pain management Discussed importance of adherence to all scheduled medical appointments Counseled on the importance of reporting any/all new or changed pain symptoms or management strategies to pain management provider Advised patient to report to care team affect of pain on daily activities Encouraged patient to discuss PT and possible benefits for balance, mobility and posture during next  Orthopedic appointment on 07/09/22 Provided education material on managing knee pain Encouraged patient to maintain her walking regimen(walking one mile a day)   Patient Goals/Self-Care Activities: Patient will self administer medications as prescribed Patient will attend all scheduled provider appointments Patient will call pharmacy for medication refills Patient will continue to perform ADL's independently Patient will continue to perform IADL's independently Patient will call provider office for new concerns or questions

## 2022-07-02 NOTE — Patient Outreach (Signed)
Medicaid Managed Care   Nurse Care Manager Note  07/02/2022 Name:  Sandra Lozano MRN:  [id number] DOB:  Aug 05, 1961  Sandra Lozano is an 61 y.o. year old female who is a primary patient of [name], MD.  The [facility] Managed Care Coordination team was consulted for assistance with:    HTN Pain Pre Diabetes  Ms. Berntsen was given information about Medicaid Managed Care Coordination team services today. [name] Patient agreed to services and verbal consent obtained.  Engaged with patient by telephone for follow up visit in response to provider referral for case management and/or care coordination services.   Assessments/Interventions:  Review of past medical history, allergies, medications, health status, including review of consultants reports, laboratory and other test data, was performed as part of comprehensive evaluation and provision of chronic care management services.  SDOH (Social Determinants of Health) assessments and interventions performed:   Care Plan  No Known Allergies  Medications Reviewed Today     Reviewed by Melissa Montane, RN (Registered Nurse) on 07/02/22 at [place] List Status: <None>   Medication Order Taking? Sig Documenting Provider Last Dose Status Informant  Accu-Chek Softclix Lancets lancets [id number] Yes Use to check blood sugar once a day Autry-Lott, Simone, DO Taking Active   Acetaminophen (TYLENOL 8 HOUR ARTHRITIS PAIN PO) [id number] Yes Take by mouth. [provider] Taking Active   acetaminophen (TYLENOL) 500 MG tablet [id number] No Take 1 tablet (500 mg total) by mouth every 6 (six) hours as needed.  Patient not taking: Reported on 07/02/2022   [name], DO Not Taking Active   aspirin EC 325 MG tablet [id number] No Take 650 mg by mouth daily. Takes every morning  Patient not taking: Reported on 07/02/2022   [provider] Not Taking Active Self  atorvastatin (LIPITOR) 20 MG tablet [id number] Yes Take 1 tablet (20 mg  total) by mouth every evening. Leeanne Rio, MD Taking Active   betamethasone acetate-betamethasone sodium phosphate (CELESTONE) injection 3 mg [id number]   Edrick Kins, DPM  Active   Blood Glucose Monitoring Suppl w/Device KIT [id number] Yes 1 kit by Does not apply route daily. [name], DO Taking Active   Blood Pressure Monitoring (BLOOD PRESSURE CUFF) MISC [id number] Yes Check your blood pressure in the morning when you wake up, and at night before you go to bed Autry-Lott, Naaman Plummer, DO Taking Active   cetirizine (ZYRTEC) 10 MG tablet [id number] Yes Take 1 tablet (10 mg total) by mouth daily. Autry-Lott, Naaman Plummer, DO Taking Active   diclofenac Sodium (VOLTAREN) 1 % GEL [id number] Yes Apply 4 g topically 4 (four) times daily. [name], MD Taking Active   fluticasone Asencion Islam) 50 MCG/ACT nasal spray [id number] Yes Place 2 sprays into both nostrils daily. Autry-Lott, Naaman Plummer, DO Taking Active   gabapentin (NEURONTIN) 600 MG tablet [id number] Yes Take 1 tablet (600 mg total) by mouth at bedtime. [name], MD Taking Active   glucose blood test strip [id number] Yes Please use to check blood sugar once daily. Autry-Lott, Naaman Plummer, DO Taking Active   lisinopril-hydrochlorothiazide (ZESTORETIC) 20-12.5 MG tablet [id number] Yes TAKE 2 TABLETS BY MOUTH DAILY. Ezequiel Essex, MD Taking Active   meloxicam [facility]) 15 MG tablet [id number] Yes Take 1 tablet (15 mg total) by mouth daily. Autry-Lott, Naaman Plummer, DO Taking Active   metFORMIN (GLUCOPHAGE) 500 MG tablet [id number] Yes Take 1 tablet (500 mg total) by mouth daily with breakfast. Leeanne Rio, MD Taking Active   Omega-3 Fatty Acids (FISH OIL) 1000  MG CAPS [id number] Yes Take 1 capsule (1,000 mg total) by mouth at bedtime. Autry-Lott, Naaman Plummer, DO Taking Active   triamcinolone ointment (KENALOG) 0.1 % [id number] Yes Apply 1 Application topically 2 (two) times daily. Apply to scalp. Don't use more than 2 weeks in a row without physician  approval. 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   Bilateral carpal tunnel syndrome 12/21/2021   Arthritis of finger of left hand 11/26/2021   Chest wall pain 04/20/2021   Hot flashes 04/20/2021   Traumatic arthritis of left foot    Skin lesion of scalp 06/04/2020   Arthritis of midfoot 03/14/2020   History of lumbar fusion 03/14/2020   Neuropathy 11/07/2019   Pre-diabetes 11/07/2019   Nummular eczema 07/06/2019   Lumbar stenosis 05/20/2019   Acute left-sided low back pain without sciatica 07/18/2018   Osteoarthritis of right hip 06/03/2018   Chronic right shoulder pain 05/05/2018   Right hip pain 05/05/2018   Rupture of anterior cruciate ligament of right knee 02/15/2018   Sprain of medial collateral ligament of right knee 02/15/2018   Essential hypertension 02/15/2018    Conditions to be addressed/monitored per PCP order:  HTN and Pain and PreDiabetes  Care Plan : [place]  Updates made by Melissa Montane, RN since 07/02/2022 12:00 AM     Problem: Chronic Disease Management and Care Coordination Needs realted to HTN, prediabetes, and chronic pain   Priority: High     Long-Range Goal: Development of Plan Of Care For Chronic Disease Management and Care Coordination Needs To Assist With Meeting Treatment Goals for HTN, prediabetes and chronic pain and HLD   Start Date: 08/05/2021  Expected End Date: 08/05/2022  Priority: High  Note:   Current Barriers:  Chronic Disease Management support and education needs related to HTN, prediabetes and chronic pain, HLD RNCM Clinical Goal(s):  Patient will verbalize understanding of plan for management of HTN , prediabetes and chronic pain through collaboration with RN Care manager, provider, and care team.   Interventions: Inter-disciplinary care team collaboration (see longitudinal plan of care) Evaluation of current  treatment plan related to  self management and patient's adherence to plan as established by provider Discussed Dermatology referral, patient has appointment on 08/22/22    Hypertension: (Status: Goal on Track (progressing): YES.)Long Term- per patient >75% of BP readings at home, she attributes higher readings at provider's office due to pain. BP at home today 128/70 Last practice recorded BP readings:  BP Readings from Last 3 Encounters:  04/30/22 130/70  02/14/22 (!) 160/91  02/05/22 (!) 142/81        Most recent eGFR/CrCl:  Lab Results  Component Value Date   EGFR 99 10/30/2021    No components found for: "CRCL"  Evaluation of current treatment plan related to hypertension self management and patient's adherence to plan as established by provider;   Reviewed medications with patient and discussed importance of compliance;  Assessed frequency of home BP monitoring and variance of home BP readings Advised patient to take her BP monitor to her next office appointment to check accuracy Assessed BP readings in provider's office via EMR review Discussed plans with patient for ongoing care management follow up and provided patient with direct contact information for care management team;   Pre- Diabetes Interventions:  (Status:  Goal on track:  Yes.) Long Term Goal- patient states her home CBGs are meeting treatment targets-Fasting BS today 110 Assessed patient's understanding of A1c goal:  <6.4% Reviewed medications with patient and discussed importance of medication adherence Counseled on importance of regular laboratory monitoring as prescribed Advised patient, providing education and rationale, to check cbg (every morning -fasting)  and record, calling PCP for findings outside established parameters Review of patient status, including review of consultants reports, relevant laboratory and other test results, and medications completed Lab Results  Component Value Date   HGBA1C 6.1  08/22/2020    Lab Results  Component Value Date   CHOL 159 02/05/2022   HDL 57 02/05/2022   LDLCALC 81 02/05/2022   TRIG 117 02/05/2022   CHOLHDL 2.8 02/05/2022    HLD- meeting treatment goals as per results above, takes statin as directed   Pain Interventions:  (Status:  Goal on track:  Yes.) Long Term Goal- Patient with increased left knee pain, tingling both knees. Reports falling 3 times since receiving steroid injection in left knee. Feels like her balance is off. Medications reviewed, discussed taking meloxicam in the morning instead of at night Reviewed provider established plan for pain management Discussed importance of adherence to all scheduled medical appointments Counseled on the importance of reporting any/all new or changed pain symptoms or management strategies to pain management provider Advised patient to report to care team affect of pain on daily activities Encouraged patient to discuss PT and possible benefits for balance, mobility and posture during next Orthopedic appointment on 07/09/22 Provided education material on managing knee pain Encouraged patient to maintain her walking regimen(walking one mile a day)   Patient Goals/Self-Care Activities: Patient will self administer medications as prescribed Patient will attend all scheduled provider appointments Patient will call pharmacy for medication refills Patient will continue to perform ADL's independently Patient will continue to perform IADL's independently Patient will call provider office for new concerns or questions        Follow Up:  Patient agrees to Care Plan and Follow-up.  Plan: The Managed Medicaid care management team will reach out to the patient again over the next 30 days.  Date/time of next scheduled RN care management/care coordination outreach:  08/06/22 @ [place] RN, [place] RN Care Coordinator

## 2022-07-09 ENCOUNTER — Telehealth: Payer: Self-pay | Admitting: Family

## 2022-07-09 ENCOUNTER — Encounter: Payer: Self-pay | Admitting: Family

## 2022-07-09 ENCOUNTER — Ambulatory Visit (INDEPENDENT_AMBULATORY_CARE_PROVIDER_SITE_OTHER): Payer: Medicaid Other | Admitting: Family

## 2022-07-09 ENCOUNTER — Ambulatory Visit (INDEPENDENT_AMBULATORY_CARE_PROVIDER_SITE_OTHER): Payer: Medicaid Other

## 2022-07-09 DIAGNOSIS — M25561 Pain in right knee: Secondary | ICD-10-CM | POA: Diagnosis not present

## 2022-07-09 DIAGNOSIS — G8929 Other chronic pain: Secondary | ICD-10-CM

## 2022-07-09 NOTE — Progress Notes (Signed)
Office Visit Note   Patient: Sandra Lozano           Date of Birth: 1961/02/10           MRN: [id number] Visit Date: 07/09/2022              Requested by: Lockie Mola, MD [address] [place],  [place] [address] PCP: Lockie Mola, MD  Chief Complaint  Patient presents with   Left Knee - Pain      HPI: The patient is a 61 year old woman who presents today complaining of bilateral knee pain left worse than right.  She has a longstanding history of issues with her left knee previously.  She has had several falls recently due to giving way and popping of the left knee she has fallen and landed on both of her knees she feels the left knee is unstable.  She is currently taking Tylenol 3 times a day as well as meloxicam using pain patches and topical creams with minimal relief.  Her last Depo-Medrol injection did not provide her with any relief of the left knee.  She would like to consider supplemental injections  The right knee has been bothering her bothering her for many months she reports the pain has been worsening lately she does have a history of ACL injury in the right knee aching throbbing pain with weightbearing no swelling no giving way  Ambulates with a straight cane for stability  Assessment & Plan: Visit Diagnoses:  1. Chronic pain of right knee     Plan: We will request authorization for supplemental injection bilateral knees.  We will call her when this is approved.  Follow-Up Instructions: No follow-ups on file.   Right Knee Exam   Muscle Strength  The patient has normal right knee strength.  Tenderness  The patient is experiencing tenderness in the medial joint line.  Range of Motion  The patient has normal right knee ROM.  Other  Effusion: no effusion present   Left Knee Exam   Muscle Strength  The patient has normal left knee strength.  Tenderness  The patient is experiencing tenderness in the medial joint line.  Range of Motion  The patient  has normal left knee ROM.  Other  Swelling: mild Effusion: no effusion present  Comments:  Pain with flexion      Patient is alert, oriented, no adenopathy, well-dressed, normal affect, normal respiratory effort.   Imaging: No results found. No images are attached to the encounter.  Labs: Lab Results  Component Value Date   HGBA1C 6.1 08/22/2020   HGBA1C 6.3 11/01/2019   ESRSEDRATE 11 11/26/2021   LABURIC 5.2 11/26/2021     Lab Results  Component Value Date   ALBUMIN 4.0 05/18/2019    No results found for: "MG" No results found for: "VD25OH"  No results found for: "PREALBUMIN"    Latest Ref Rng & Units 11/02/2020    8:05 AM 05/21/2019    2:10 AM 05/18/2019   10:02 AM  CBC EXTENDED  WBC 4.0 - 10.5 K/uL  11.4  10.5   RBC 3.87 - 5.11 MIL/uL  3.35  4.78   Hemoglobin 12.0 - 15.0 g/dL 25.8  9.3  52.7   HCT 78.2 - 46.0 % 43.0  28.1  41.2   Platelets 150 - 400 K/uL  284  356      There is no height or weight on file to calculate BMI.  Orders:  Orders Placed This Encounter  Procedures   XR Knee 1-2 Views Right   No orders of the defined types were placed in this encounter.    Procedures: No procedures performed  Clinical Data: No additional findings.  ROS:  All other systems negative, except as noted in the HPI. Review of Systems  Objective: Vital Signs: LMP  (LMP Unknown)   Specialty Comments:  No specialty comments available.  PMFS History: Patient Active Problem List   Diagnosis Date Noted   COPD, mild (HCC) 02/11/2022   Bilateral carpal tunnel syndrome 12/21/2021   Arthritis of finger of left hand 11/26/2021   Chest wall pain 04/20/2021   Hot flashes 04/20/2021   Traumatic arthritis of left foot    Skin lesion of scalp 06/04/2020   Arthritis of midfoot 03/14/2020   History of lumbar fusion 03/14/2020   Neuropathy 11/07/2019   Pre-diabetes 11/07/2019   Nummular eczema 07/06/2019   Lumbar stenosis 05/20/2019   Acute left-sided low  back pain without sciatica 07/18/2018   Osteoarthritis of right hip 06/03/2018   Chronic right shoulder pain 05/05/2018   Right hip pain 05/05/2018   Rupture of anterior cruciate ligament of right knee 02/15/2018   Sprain of medial collateral ligament of right knee 02/15/2018   Essential hypertension 02/15/2018   Past Medical History:  Diagnosis Date   Arthritis    right knee, lower back   Dyspnea    very rare -tx with albuterol neb sol if needed   Hyperlipidemia    Hypertension    Pre-diabetes    per patient - does not check blood sugar   SVD (spontaneous vaginal delivery)    x 5 - only 2 living, 1 stillborn and 1 premature at 7 months demise,1 child deceased   Wears dentures    upper and lower    Family History  Problem Relation Age of Onset   Cancer Sister    Healthy Daughter    Healthy Daughter     Past Surgical History:  Procedure Laterality Date   FOOT ARTHRODESIS Left 11/02/2020   Procedure: LEFT MIDFOOT FUSION BASE 1ST AND 2ND METATARSAL;  Surgeon: Nadara Mustard, MD;  Location: [facility] OR;  Service: Orthopedics;  Laterality: Left;   LUMBAR FUSION  05/20/2019   GILL PROCEDURE, LEFT TRANSFORAMINAL LUMBAR INTERBODY FUSION, PEDICLE INSTRUMENTATION, BILATERAL FUSION (N/A   TUBAL LIGATION     Social History   Occupational History   Not on file  Tobacco Use   Smoking status: Former    Packs/day: 1.00    Years: 30.00    Total pack years: 30.00    Types: Cigarettes    Quit date: 01/2015    Years since quitting: 7.4    Passive exposure: Past   Smokeless tobacco: Never  Vaping Use   Vaping Use: Never used  Substance and Sexual Activity   Alcohol use: Not Currently   Drug use: Not Currently    Types: Marijuana    Comment: stopped 12/15/2021   Sexual activity: Not on file

## 2022-07-09 NOTE — Telephone Encounter (Signed)
Pt here for office visit today for osteoarthritis in bilateral knees. She is wanting to get approval started for gel injections. She was informed April is out of office this week.

## 2022-07-14 NOTE — Telephone Encounter (Signed)
Talked with patient concerning gel injection and the [employer] no longer covers for Medicaid patients. Offered patient TriVisc, but patient declined at this time.  Patient voiced that she understands.

## 2022-07-15 DIAGNOSIS — Z419 Encounter for procedure for purposes other than remedying health state, unspecified: Secondary | ICD-10-CM | POA: Diagnosis not present

## 2022-07-19 ENCOUNTER — Other Ambulatory Visit: Payer: Self-pay | Admitting: Family Medicine

## 2022-07-19 DIAGNOSIS — G5601 Carpal tunnel syndrome, right upper limb: Secondary | ICD-10-CM

## 2022-07-21 ENCOUNTER — Other Ambulatory Visit: Payer: Self-pay

## 2022-07-21 DIAGNOSIS — G5601 Carpal tunnel syndrome, right upper limb: Secondary | ICD-10-CM

## 2022-07-21 MED ORDER — ACETAMINOPHEN 500 MG PO TABS
500.0000 mg | ORAL_TABLET | Freq: Four times a day (QID) | ORAL | 0 refills | Status: DC | PRN
Start: 2022-07-21 — End: 2022-11-12

## 2022-07-21 MED ORDER — MELOXICAM 15 MG PO TABS: 15.0000 mg | ORAL_TABLET | Freq: Every day | ORAL | 0 refills | Status: DC

## 2022-07-21 MED ORDER — METFORMIN HCL 500 MG PO TABS
500.0000 mg | ORAL_TABLET | Freq: Every day | ORAL | 0 refills | Status: DC
Start: 2022-07-21 — End: 2022-08-20

## 2022-07-21 NOTE — Telephone Encounter (Signed)
Telephone call with patient.   Refill for meloxicam and tylenol requested. Patient tried tylenol arthritis after last visit, but it did not provide her with any relief. She is interested in protonix or another PPI to protect her stomach.  Medications refilled. She also requested an appointment with [place] to discuss her knee pain.

## 2022-07-21 NOTE — Telephone Encounter (Signed)
Patient calls nurse line requesting refills on meloxicam and tylenol. She states that she uses these medications for arthritis.   Per chart review, patient attempted to request refills through mychart, however, she received automatic denial on these medications.   Please advise.   Veronda Prude, RN

## 2022-07-30 ENCOUNTER — Ambulatory Visit: Payer: Medicaid Other | Admitting: Family Medicine

## 2022-07-30 DIAGNOSIS — M1990 Unspecified osteoarthritis, unspecified site: Secondary | ICD-10-CM | POA: Diagnosis not present

## 2022-07-30 DIAGNOSIS — Z6838 Body mass index (BMI) 38.0-38.9, adult: Secondary | ICD-10-CM | POA: Diagnosis not present

## 2022-07-30 DIAGNOSIS — E1142 Type 2 diabetes mellitus with diabetic polyneuropathy: Secondary | ICD-10-CM | POA: Diagnosis not present

## 2022-07-30 DIAGNOSIS — H04129 Dry eye syndrome of unspecified lacrimal gland: Secondary | ICD-10-CM | POA: Diagnosis not present

## 2022-07-30 DIAGNOSIS — Z9181 History of falling: Secondary | ICD-10-CM | POA: Diagnosis not present

## 2022-07-30 DIAGNOSIS — Z791 Long term (current) use of non-steroidal anti-inflammatories (NSAID): Secondary | ICD-10-CM | POA: Diagnosis not present

## 2022-07-30 DIAGNOSIS — Z7984 Long term (current) use of oral hypoglycemic drugs: Secondary | ICD-10-CM | POA: Diagnosis not present

## 2022-07-30 DIAGNOSIS — I10 Essential (primary) hypertension: Secondary | ICD-10-CM | POA: Diagnosis not present

## 2022-07-30 DIAGNOSIS — J309 Allergic rhinitis, unspecified: Secondary | ICD-10-CM | POA: Diagnosis not present

## 2022-07-30 DIAGNOSIS — E785 Hyperlipidemia, unspecified: Secondary | ICD-10-CM | POA: Diagnosis not present

## 2022-08-06 ENCOUNTER — Encounter: Payer: Self-pay | Admitting: Family Medicine

## 2022-08-06 ENCOUNTER — Other Ambulatory Visit: Payer: Self-pay

## 2022-08-06 ENCOUNTER — Ambulatory Visit (INDEPENDENT_AMBULATORY_CARE_PROVIDER_SITE_OTHER): Payer: Medicaid Other | Admitting: Family Medicine

## 2022-08-06 DIAGNOSIS — R7303 Prediabetes: Secondary | ICD-10-CM | POA: Diagnosis not present

## 2022-08-06 DIAGNOSIS — G5601 Carpal tunnel syndrome, right upper limb: Secondary | ICD-10-CM

## 2022-08-06 DIAGNOSIS — L304 Erythema intertrigo: Secondary | ICD-10-CM | POA: Diagnosis not present

## 2022-08-06 DIAGNOSIS — M158 Other polyosteoarthritis: Secondary | ICD-10-CM | POA: Diagnosis not present

## 2022-08-06 DIAGNOSIS — M159 Polyosteoarthritis, unspecified: Secondary | ICD-10-CM | POA: Insufficient documentation

## 2022-08-06 HISTORY — DX: Erythema intertrigo: L30.4

## 2022-08-06 MED ORDER — MELOXICAM 15 MG PO TABS
15.0000 mg | ORAL_TABLET | Freq: Every day | ORAL | 1 refills | Status: DC
Start: 2022-08-06 — End: 2022-08-20

## 2022-08-06 MED ORDER — TERBINAFINE HCL 1 % EX CREA: 1.0000 | TOPICAL_CREAM | Freq: Two times a day (BID) | CUTANEOUS | 1 refills | Status: DC

## 2022-08-06 NOTE — Patient Instructions (Signed)
Good to see you today - Thank you for coming in  Things we discussed today:  Intetrigo - Under breast Lamisil Terbenafine cream twice a day stop the triamcinolone Dry under the breasts twice a day   Hips and Knees Keep walking every day Stretch every day  Lose weight - make a appointment to see Dr Marsh Dolly maybe about a medication to help Take the mobic and tylenol only as needed   - Tylenol arthritis 2 three times a day   Please always bring your medication bottles

## 2022-08-06 NOTE — Assessment & Plan Note (Signed)
Her exam is consistent with diffuse DJD.  Discussed keeping active, as needed use of Tylenol (2 not 3 tabs at once) and Mobic and showed her stretching exercises.  She is interested in weight loss to help with lower extremity function and pain

## 2022-08-06 NOTE — Assessment & Plan Note (Signed)
Under both breasts. Stop steroid and begin terbinafine and keep dry

## 2022-08-06 NOTE — Assessment & Plan Note (Signed)
With obesity.  Consider trying GLP1 for weight control to help with blood sugar and DJD

## 2022-08-06 NOTE — Progress Notes (Signed)
    SUBJECTIVE:   CHIEF COMPLAINT / HPI:   Knee Ankle Pain Has been long standing.  Seen 7/26 by Ortho who are applying for supplemental knee injections.   Was told she has an ACL injury on her R knee.  Takes tylenol arthritis 3 tabs twice a day OR mobic 15 mg daily.  Walks a mile every day.  No fevers or rashes or history of inflammatory arthritis.  Weight Understands losing weight would help with her joint pains.   Intertrigo Has bilateral itchy irritated rash under both breasts.  Has been using triamcinolone and baking soda.  No fevers or discharge   PERTINENT  PMH / PSH: hypertension prediabetes mild copd arthritis multiple joints  Mobic gabapentin   OBJECTIVE:   BP (!) 141/61   Pulse 62   Wt 223 lb 6.4 oz (101.3 kg)   LMP  (LMP Unknown)   SpO2 100%   BMI 38.35 kg/m   Walks with a limp on R side when first stand Able to get up on heels and toes and do slight deep knee bend but with pain No SLR bilaterally Distal strength intact No pain with leg internal external rotation Knees ankles- mild deformity with minimal effusion of knees bilaterally. FROM. DJD changes of both hands with heberdens nodes Breasts - hypopigmented diffuse moist rash under both   ASSESSMENT/PLAN:   DJD (degenerative joint disease), multiple sites Her exam is consistent with diffuse DJD.  Discussed keeping active, as needed use of Tylenol (2 not 3 tabs at once) and Mobic and showed her stretching exercises.  She is interested in weight loss to help with lower extremity function and pain  Pre-diabetes With obesity.  Consider trying GLP1 for weight control to help with blood sugar and DJD   Intertrigo Under both breasts. Stop steroid and begin terbinafine and keep dry    Patient Instructions  Good to see you today - Thank you for coming in  Things we discussed today:  Intetrigo - Under breast Lamisil Terbenafine cream twice a day stop the triamcinolone Dry under the breasts twice a day    Hips and Knees Keep walking every day Stretch every day  Lose weight - make a appointment to see Dr Marsh Dolly maybe about a medication to help Take the mobic and tylenol only as needed   - Tylenol arthritis 2 three times a day   Please always bring your medication bottles      Carney Living, MD [facility]

## 2022-08-15 DIAGNOSIS — Z419 Encounter for procedure for purposes other than remedying health state, unspecified: Secondary | ICD-10-CM | POA: Diagnosis not present

## 2022-08-19 ENCOUNTER — Ambulatory Visit (INDEPENDENT_AMBULATORY_CARE_PROVIDER_SITE_OTHER): Payer: Medicaid Other | Admitting: Family Medicine

## 2022-08-19 VITALS — BP 135/70 | HR 93 | Wt 220.0 lb

## 2022-08-19 DIAGNOSIS — M25512 Pain in left shoulder: Secondary | ICD-10-CM | POA: Diagnosis not present

## 2022-08-19 DIAGNOSIS — G5603 Carpal tunnel syndrome, bilateral upper limbs: Secondary | ICD-10-CM

## 2022-08-19 DIAGNOSIS — R7303 Prediabetes: Secondary | ICD-10-CM

## 2022-08-19 LAB — POCT GLYCOSYLATED HEMOGLOBIN (HGB A1C): HbA1c, POC (prediabetic range): 6.4 % (ref 5.7–6.4)

## 2022-08-19 MED ORDER — SEMAGLUTIDE(0.25 OR 0.5MG/DOS) 2 MG/1.5ML ~~LOC~~ SOPN: 0.2500 mg | PEN_INJECTOR | SUBCUTANEOUS | 0 refills | Status: DC

## 2022-08-19 NOTE — Patient Instructions (Addendum)
It was great to see you today! Thank you for choosing [facility] for your primary care. Sandra Lozano was seen for shoulder pain, weight loss, ankle pain.  Today we addressed: Shoulder pain -rotator cuff syndrome.  Ways that you can work on this are fixing her posture, Voltaren, lidocaine.  You can try yoga which will help work on her posture Weight loss-I will order a referral to the healthy weight center I will also start you on a GLP-1 medication this will be a weekly injection.  Need to follow-up with you in 1 month continue to work on exercise.  You can try walking workouts on YouTube like the normal growth with the [place].  Carpal tunnel-you can stick with the same gabapentin dose.  I will send a referral to sports medicine so that you can try the steroid shots.  If this does not work we can always refer to hand surgery.  If you haven't already, sign up for My Chart to have easy access to your labs results, and communication with your primary care physician.  We are checking some labs today. If they are abnormal, I will call you. If they are normal, I will send you a MyChart message (if it is active) or a letter in the mail. If you do not hear about your labs in the next 2 weeks, please call the office.   You should return to our clinic Return in about 4 weeks (around 09/16/2022) for GLP 1 f/u .  I recommend that you always bring your medications to each appointment as this makes it easy to ensure you are on the correct medications and helps [place] not miss refills when you need them.  Please arrive 15 minutes before your appointment to ensure smooth check in process.  We appreciate your efforts in making this happen.  Please call the clinic at [phone] if your symptoms worsen or you have any concerns.  Thank you for allowing me to participate in your care, Lockie Mola, MD 08/19/2022, 4:57 PM PGY-1, [facility]

## 2022-08-19 NOTE — Progress Notes (Signed)
    SUBJECTIVE:   CHIEF COMPLAINT / HPI:   Left shoulder pain  She says that its a sharp pain. Started 6 months ago. Hurts when she's relaxed, sitting at home watching TV. Does not stop her from being able to do daily activities. Starts at her shoulder and goes down her upper arm. Already takes tylenol and meloxicam for her arthritis pain which does not resolve the pain. She mentions that she says she says that she has poor posture and says she "stands on her knees".   Weight loss  She walks every day for 30 mins. She has a walking buddy. She says she is not a big eater. She eats oatmeal in the morning and one egg and one sausage. Lunch she m akes a sandwich by herself. She eats half the sandwich. She has a little amount of food.   Carpal tunnel  Has had carpal tunnel for 1 year in both hands. She uses gabapentil but feels like she gets sleep during the day at work if she uses all of the medication, so takes only one pill during the day. She used to have symptoms just at night now she has symptoms in the day too for the last month. She tried physical therapy for 6 weeks but did not have resolution of symptoms. Now takes gabapentin 1 in morning and 2 at night. She does not want another surgery. She says she is dropping things now. She says it is not because of pain but she does not have the strength.   PERTINENT  PMH / PSH: HTN, COPD, DJD  OBJECTIVE:   BP 135/70   Pulse 93   Wt 99.8 kg   LMP  (LMP Unknown)   SpO2 97%   BMI 37.76 kg/m   General: well appearing  Neuro: A&O x 4, passive and active ROM of both upper extremities intact, strength 4/4 of deltoids, grip strength 3/4 bilaterally, positive tinel and phalen bilaterally  CV: RRR, well perfused, pulses palpable and equal  Resp: No increased work of breathing on room air  Abd: soft, non distended, non tender to palpation   ASSESSMENT/PLAN:   Pre-diabetes A1c today 6.4. Hx of prediabetes on metformin 500 mg daily.  - refer to  healthy weight center  - counseled regarding at home exercises and meals including increasing protein intake  - started GLP 1 0.25  Left shoulder pain Subacute left shoulder pain. Most likely rotator cuff syndrome as there is no passive ROM obstruction and no AROM pain. No radicular pain. Most likely secondary to her way of sitting.  - Increase daily movement.  - Voltaren gel as needed   Carpal tunnel syndrome, bilateral Bilateral carpal tunnel, now worsened to during the day as well. Proprioception now affected. S/p 6 weeks of PT.  - refer to sports medicine to consider injections  - If not improved refer to hand surgery  - Continue gabapentin and brace use      Lockie Mola, MD [facility]

## 2022-08-19 NOTE — Assessment & Plan Note (Addendum)
A1c today 6.4. Hx of prediabetes on metformin 500 mg daily.  - refer to healthy weight center  - counseled regarding at home exercises and meals including increasing protein intake  - started GLP 1 0.25

## 2022-08-20 ENCOUNTER — Other Ambulatory Visit: Payer: Self-pay | Admitting: *Deleted

## 2022-08-20 ENCOUNTER — Other Ambulatory Visit: Payer: Self-pay | Admitting: Family Medicine

## 2022-08-20 DIAGNOSIS — E785 Hyperlipidemia, unspecified: Secondary | ICD-10-CM

## 2022-08-20 DIAGNOSIS — R7303 Prediabetes: Secondary | ICD-10-CM

## 2022-08-20 DIAGNOSIS — I10 Essential (primary) hypertension: Secondary | ICD-10-CM

## 2022-08-20 DIAGNOSIS — G5601 Carpal tunnel syndrome, right upper limb: Secondary | ICD-10-CM

## 2022-08-20 MED ORDER — METFORMIN HCL 500 MG PO TABS: 500.0000 mg | ORAL_TABLET | Freq: Every day | ORAL | 0 refills | Status: DC

## 2022-08-20 MED ORDER — GLUCOSE BLOOD VI STRP: ORAL_STRIP | 6 refills | Status: DC

## 2022-08-20 MED ORDER — ACCU-CHEK SOFTCLIX LANCETS MISC: 12 refills | Status: DC

## 2022-08-20 MED ORDER — ATORVASTATIN CALCIUM 20 MG PO TABS: 20.0000 mg | ORAL_TABLET | Freq: Every evening | ORAL | 0 refills | Status: DC

## 2022-08-20 MED ORDER — MELOXICAM 15 MG PO TABS: 15.0000 mg | ORAL_TABLET | Freq: Every day | ORAL | 1 refills | Status: DC

## 2022-08-20 MED ORDER — GABAPENTIN 600 MG PO TABS: 600.0000 mg | ORAL_TABLET | Freq: Every day | ORAL | 2 refills | Status: DC

## 2022-08-20 MED ORDER — LISINOPRIL-HYDROCHLOROTHIAZIDE 20-12.5 MG PO TABS
2.0000 | ORAL_TABLET | Freq: Every day | ORAL | 3 refills | Status: DC
Start: 2022-08-20 — End: 2023-01-05

## 2022-08-20 NOTE — Patient Instructions (Signed)
Visit Information  Sandra Lozano was given information about Medicaid Managed Care team care coordination services as a part of their [facility] Medicaid benefit. Sandra Lozano verbally consented to engagement with the [facility] Managed Care team.   If you are experiencing a medical emergency, please call 911 or report to your local emergency department or urgent care.   If you have a non-emergency medical problem during routine business hours, please contact your provider's office and ask to speak with a nurse.   For questions related to your [facility] health plan, please call: [phone] or go here:[email or web address]  If you would like to schedule transportation through your [facility] plan, please call the following number at least 2 days in advance of your appointment: [phone].  You can also use the MTM portal or MTM mobile app to manage your rides. For the portal, please go to [email or web address].  Call the [place] at [phone], at any time, 24 hours a day, 7 days a week. If you are in danger or need immediate medical attention call 911.  If you would like help to quit smoking, call 1-800-QUIT-NOW (782) 451-0279) OR Espaol: 1-855-Djelo-Ya (4-132-440-1027) o para ms informacin haga clic aqu or Text READY to 200-400 to register via text  Sandra Lozano,   Please see education materials related to carpal tunnel and prediabetes provided by MyChart link.  Patient verbalizes understanding of instructions and care plan provided today and agrees to view in [place]. Active MyChart status and patient understanding of how to access instructions and care plan via MyChart confirmed with patient.     Telephone follow up appointment with Managed Medicaid care management team member scheduled for:09/24/22 @ 11:15am  Lurena Joiner RN, BSN [place] RN Care Coordinator   Following is a copy of your plan of care:  Care Plan  : [place]  Updates made by Melissa Montane, RN since 08/20/2022 12:00 AM     Problem: Chronic Disease Management and Care Coordination Needs realted to HTN, prediabetes, and chronic pain   Priority: High     Long-Range Goal: Development of Plan Of Care For Chronic Disease Management and Care Coordination Needs To Assist With Meeting Treatment Goals for HTN, prediabetes and chronic pain and HLD   Start Date: 08/05/2021  Expected End Date: 10/14/2022  Priority: High  Note:   Current Barriers:  Chronic Disease Management support and education needs related to HTN, prediabetes and chronic pain, HLD RNCM Clinical Goal(s):  Patient will verbalize understanding of plan for management of HTN , prediabetes and chronic pain through collaboration with RN Care manager, provider, and care team.   Interventions: Inter-disciplinary care team collaboration (see longitudinal plan of care) Evaluation of current treatment plan related to  self management and patient's adherence to plan as established by provider Discussed Dermatology referral, patient has appointment on 08/22/22  Advised patient to request medications to be refilled for 90 day supply   Hypertension: (Status: Condition stable. Not addressed this visit.)Long Term- per patient >75% of BP readings at home, she attributes higher readings at provider's office due to pain. BP at home today 128/70 Last practice recorded BP readings:  BP Readings from Last 3 Encounters:  08/19/22 135/70  08/06/22 (!) 141/61  04/30/22 130/70        Most recent eGFR/CrCl:  Lab Results  Component Value Date   EGFR 99 10/30/2021    No components found for: "CRCL"  Evaluation of current treatment  plan related to hypertension self management and patient's adherence to plan as established by provider;   Reviewed medications with patient and discussed importance of compliance;  Assessed frequency of home BP monitoring and variance of home BP  readings Advised patient to take her BP monitor to her next office appointment to check accuracy Assessed BP readings in provider's office via EMR review Discussed plans with patient for ongoing care management follow up and provided patient with direct contact information for care management team;   Pre- Diabetes Interventions:  (Status:  Goal on track:  Yes.) Long Term Goal-  Assessed patient's understanding of A1c goal:  <6.4% Reviewed medications with patient and discussed importance of medication adherence Counseled on importance of regular laboratory monitoring as prescribed Advised patient, providing education and rationale, to check cbg (every morning -fasting)  and record, calling PCP for findings outside established parameters Review of patient status, including review of consultants reports, relevant laboratory and other test results, and medications completed Encouraged patient to continue compliance with diabetic diet, monitoring BS and taking her medications Lab Results  Component Value Date   HGBA1C 6.4 08/19/2022    Lab Results  Component Value Date   CHOL 159 02/05/2022   HDL 57 02/05/2022   [place] 81 02/05/2022   TRIG 117 02/05/2022   CHOLHDL 2.8 02/05/2022    HLD- meeting treatment goals as per results above, takes statin as directed   Pain Interventions:  (Status:  Goal on track:  Yes.) Long Term Goal Medications reviewed, discussed taking meloxicam in the morning instead of at night Reviewed provider established plan for pain management Discussed importance of adherence to all scheduled medical appointments Counseled on the importance of reporting any/all new or changed pain symptoms or management strategies to pain management provider Advised patient to report to care team affect of pain on daily activities Encouraged patient to discuss pain management during her [facility] appointment on 08/27/22 Provided education material on carpal tunnel Encouraged patient to  maintain her walking regimen(walking one mile a day)   Patient Goals/Self-Care Activities: Patient will self administer medications as prescribed Patient will attend all scheduled provider appointments Patient will call pharmacy for medication refills Patient will continue to perform ADL's independently Patient will continue to perform IADL's independently Patient will call provider office for new concerns or questions

## 2022-08-20 NOTE — Patient Outreach (Signed)
Medicaid Managed Care   Nurse Care Manager Note  08/20/2022 Name:  Sandra Lozano MRN:  [id number] DOB:  1961/05/28  Sandra Lozano is an 61 y.o. year old female who is a primary patient of [name], MD.  The [facility] Managed Care Coordination team was consulted for assistance with:    Pain Pre diabetes  [name] was given information about Medicaid Managed Care Coordination team services today. [name] Patient agreed to services and verbal consent obtained.  Engaged with patient by telephone for follow up visit in response to provider referral for case management and/or care coordination services.   Assessments/Interventions:  Review of past medical history, allergies, medications, health status, including review of consultants reports, laboratory and other test data, was performed as part of comprehensive evaluation and provision of chronic care management services.  SDOH (Social Determinants of Health) assessments and interventions performed: SDOH Interventions    Flowsheet Row Patient Outreach Telephone from 08/05/2021 in [place] Coordination  SDOH Interventions   Transportation Interventions Intervention Not Indicated  Physical Activity Interventions Intervention Not Indicated  Social Connections Interventions Intervention Not Indicated       Care Plan  No Known Allergies  Medications Reviewed Today     Reviewed by Melissa Montane, RN (Registered Nurse) on 08/20/22 at 1146  Med List Status: <None>   Medication Order Taking? Sig Documenting Provider Last Dose Status Informant  Accu-Chek Softclix Lancets lancets [id number] Yes Use to check blood sugar once a day Autry-Lott, Simone, DO Taking Active   Acetaminophen (TYLENOL 8 HOUR ARTHRITIS PAIN PO) [id number] Yes Take by mouth. [provider] Taking Active   acetaminophen (TYLENOL) 500 MG tablet [id number] Yes Take 1 tablet (500 mg total) by mouth every 6 (six) hours as needed.  [name], MD Taking Active   aspirin EC 325 MG tablet [id number] No Take 650 mg by mouth daily. Takes every morning  Patient not taking: Reported on 07/02/2022   [provider] Not Taking Active Self  atorvastatin (LIPITOR) 20 MG tablet [id number] Yes Take 1 tablet (20 mg total) by mouth every evening. Leeanne Rio, MD Taking Active   betamethasone acetate-betamethasone sodium phosphate (CELESTONE) injection 3 mg [id number]   Edrick Kins, DPM  Active   Blood Glucose Monitoring Suppl w/Device KIT [id number] Yes 1 kit by Does not apply route daily. [name], DO Taking Active   Blood Pressure Monitoring (BLOOD PRESSURE CUFF) MISC [id number] Yes Check your blood pressure in the morning when you wake up, and at night before you go to bed Autry-Lott, Naaman Plummer, DO Taking Active   cetirizine (ZYRTEC) 10 MG tablet [id number] Yes Take 1 tablet (10 mg total) by mouth daily. Autry-Lott, Naaman Plummer, DO Taking Active   diclofenac Sodium (VOLTAREN) 1 % GEL [id number] Yes Apply 4 g topically 4 (four) times daily. [name], MD Taking Active   fluticasone Asencion Islam) 50 MCG/ACT nasal spray [id number] Yes Place 2 sprays into both nostrils daily. Autry-Lott, Naaman Plummer, DO Taking Active   gabapentin (NEURONTIN) 600 MG tablet [id number] Yes Take 1 tablet (600 mg total) by mouth at bedtime. [name], MD Taking Active            Med Note (Minola Guin A   Wed Aug 20, 2022 11:31 AM) Patient taking differently, taking one tablet in the AM and 2 tablets at night  glucose blood test strip [id number] Yes Please use to check blood sugar once daily. Autry-Lott, Naaman Plummer, DO Taking Active   lisinopril-hydrochlorothiazide (ZESTORETIC)  20-12.5 MG tablet [id number] Yes TAKE 2 TABLETS BY MOUTH DAILY. Ezequiel Essex, MD Taking Active   meloxicam [facility]) 15 MG tablet [id number] Yes Take 1 tablet (15 mg total) by mouth daily. Lind Covert, MD Taking Active   metFORMIN (GLUCOPHAGE) 500 MG tablet [id number]  Yes Take 1 tablet (500 mg total) by mouth daily with breakfast. [name], MD Taking Active   Omega-3 Fatty Acids (FISH OIL) 1000 MG CAPS [id number] Yes Take 1 capsule (1,000 mg total) by mouth at bedtime. Autry-LottNaaman Plummer, DO Taking Active   Semaglutide,0.25 or 0.5MG/DOS, 2 MG/1.5ML SOPN [id number] Yes Inject 0.25 mg into the skin once a week. 0.25 mg once weekly for 4 weeks [name], MD Taking Active   terbinafine (LAMISIL) 1 % cream [id number] Yes Apply 1 Application topically 2 (two) times daily. Lind Covert, MD Taking Active   triamcinolone ointment (KENALOG) 0.1 % [id number] Yes Apply 1 Application topically 2 (two) times daily. Apply to scalp. Don't use more than 2 weeks in a row without physician approval. [name], MD Taking Active             Patient Active Problem List   Diagnosis Date Noted   DJD (degenerative joint disease), multiple sites 08/06/2022   Intertrigo 08/06/2022   COPD, mild ([place]) 02/11/2022   Bilateral carpal tunnel syndrome 12/21/2021   Arthritis of finger of left hand 11/26/2021   Chest wall pain 04/20/2021   Hot flashes 04/20/2021   Traumatic arthritis of left foot    Skin lesion of scalp 06/04/2020   Arthritis of midfoot 03/14/2020   History of lumbar fusion 03/14/2020   Neuropathy 11/07/2019   Pre-diabetes 11/07/2019   Nummular eczema 07/06/2019   Lumbar stenosis 05/20/2019   Acute left-sided low back pain without sciatica 07/18/2018   Osteoarthritis of right hip 06/03/2018   Chronic right shoulder pain 05/05/2018   Right hip pain 05/05/2018   Rupture of anterior cruciate ligament of right knee 02/15/2018   Sprain of medial collateral ligament of right knee 02/15/2018   Essential hypertension 02/15/2018    Conditions to be addressed/monitored per PCP order:   Pain and Pre Diabetes  Care Plan : Young  Updates made by Melissa Montane, RN since 08/20/2022 12:00 AM     Problem: Chronic Disease  Management and Care Coordination Needs realted to HTN, prediabetes, and chronic pain   Priority: High     Long-Range Goal: Development of Plan Of Care For Chronic Disease Management and Care Coordination Needs To Assist With Meeting Treatment Goals for HTN, prediabetes and chronic pain and HLD   Start Date: 08/05/2021  Expected End Date: 10/14/2022  Priority: High  Note:   Current Barriers:  Chronic Disease Management support and education needs related to HTN, prediabetes and chronic pain, HLD RNCM Clinical Goal(s):  Patient will verbalize understanding of plan for management of HTN , prediabetes and chronic pain through collaboration with RN Care manager, provider, and care team.   Interventions: Inter-disciplinary care team collaboration (see longitudinal plan of care) Evaluation of current treatment plan related to  self management and patient's adherence to plan as established by provider Discussed Dermatology referral, patient has appointment on 08/22/22  Advised patient to request medications to be refilled for 90 day supply   Hypertension: (Status: Condition stable. Not addressed this visit.)Long Term- per patient >75% of BP readings at home, she attributes higher readings at provider's office due to pain. BP at home today 128/70 Last  practice recorded BP readings:  BP Readings from Last 3 Encounters:  08/19/22 135/70  08/06/22 (!) 141/61  04/30/22 130/70        Most recent eGFR/CrCl:  Lab Results  Component Value Date   EGFR 99 10/30/2021    No components found for: "CRCL"  Evaluation of current treatment plan related to hypertension self management and patient's adherence to plan as established by provider;   Reviewed medications with patient and discussed importance of compliance;  Assessed frequency of home BP monitoring and variance of home BP readings Advised patient to take her BP monitor to her next office appointment to check accuracy Assessed BP readings in  provider's office via EMR review Discussed plans with patient for ongoing care management follow up and provided patient with direct contact information for care management team;   Pre- Diabetes Interventions:  (Status:  Goal on track:  Yes.) Long Term Goal-  Assessed patient's understanding of A1c goal:  <6.4% Reviewed medications with patient and discussed importance of medication adherence Counseled on importance of regular laboratory monitoring as prescribed Advised patient, providing education and rationale, to check cbg (every morning -fasting)  and record, calling PCP for findings outside established parameters Review of patient status, including review of consultants reports, relevant laboratory and other test results, and medications completed Encouraged patient to continue compliance with diabetic diet, monitoring BS and taking her medications Lab Results  Component Value Date   HGBA1C 6.4 08/19/2022    Lab Results  Component Value Date   CHOL 159 02/05/2022   HDL 57 02/05/2022   [place] 81 02/05/2022   TRIG 117 02/05/2022   CHOLHDL 2.8 02/05/2022    HLD- meeting treatment goals as per results above, takes statin as directed   Pain Interventions:  (Status:  Goal on track:  Yes.) Long Term Goal Medications reviewed, discussed taking meloxicam in the morning instead of at night Reviewed provider established plan for pain management Discussed importance of adherence to all scheduled medical appointments Counseled on the importance of reporting any/all new or changed pain symptoms or management strategies to pain management provider Advised patient to report to care team affect of pain on daily activities Encouraged patient to discuss pain management during her [facility] appointment on 08/27/22 Provided education material on carpal tunnel Encouraged patient to maintain her walking regimen(walking one mile a day)   Patient Goals/Self-Care Activities: Patient will self administer  medications as prescribed Patient will attend all scheduled provider appointments Patient will call pharmacy for medication refills Patient will continue to perform ADL's independently Patient will continue to perform IADL's independently Patient will call provider office for new concerns or questions        Follow Up:  Patient agrees to Care Plan and Follow-up.  Plan: The Managed Medicaid care management team will reach out to the patient again over the next 30 days.  Date/time of next scheduled RN care management/care coordination outreach:  09/24/22 @ 11:15am  Lurena Joiner RN, BSN [place]  [employer] manager

## 2022-08-22 ENCOUNTER — Other Ambulatory Visit (HOSPITAL_COMMUNITY): Payer: Self-pay

## 2022-08-25 ENCOUNTER — Other Ambulatory Visit (HOSPITAL_COMMUNITY): Payer: Self-pay

## 2022-08-25 DIAGNOSIS — M25512 Pain in left shoulder: Secondary | ICD-10-CM | POA: Insufficient documentation

## 2022-08-25 NOTE — Assessment & Plan Note (Signed)
Subacute left shoulder pain. Most likely rotator cuff syndrome as there is no passive ROM obstruction and no AROM pain. No radicular pain. Most likely secondary to her way of sitting.  - Increase daily movement.  - Voltaren gel as needed

## 2022-08-25 NOTE — Assessment & Plan Note (Signed)
Bilateral carpal tunnel, now worsened to during the day as well. Proprioception now affected. S/p 6 weeks of PT.  - refer to sports medicine to consider injections  - If not improved refer to hand surgery  - Continue gabapentin and brace use

## 2022-08-26 ENCOUNTER — Telehealth: Payer: Self-pay

## 2022-08-26 NOTE — Telephone Encounter (Signed)
A Prior Authorization was initiated for this patients [employer]  through CoverMyMeds.   Key: [id number]

## 2022-08-26 NOTE — Telephone Encounter (Signed)
Patient Advocate Encounter  Prior Authorization for [facility] has been approved.    Key: [id number]  Effective dates: 08/21/2022 through FURTHER NOTICE   Melanee Spry CPhT Rx Patient Advocate

## 2022-08-27 ENCOUNTER — Ambulatory Visit: Payer: Medicaid Other | Admitting: Sports Medicine

## 2022-08-27 VITALS — BP 138/70 | Ht 64.0 in | Wt 221.0 lb

## 2022-08-27 DIAGNOSIS — G5601 Carpal tunnel syndrome, right upper limb: Secondary | ICD-10-CM | POA: Diagnosis not present

## 2022-08-27 DIAGNOSIS — G5603 Carpal tunnel syndrome, bilateral upper limbs: Secondary | ICD-10-CM

## 2022-08-27 MED ORDER — METHYLPREDNISOLONE ACETATE 40 MG/ML IJ SUSP: 40.0000 mg | Freq: Once | INTRAMUSCULAR | Status: DC

## 2022-08-27 NOTE — Assessment & Plan Note (Addendum)
B/l, R>L for >30mo despite conservative measures, cock up splints at night, OT, home exercises and medications- gabapentin.  She has some numbness and tingling affecting dorsal aspect of hand and ring and pinky finger, which does not fall into median nerve distribution, so it is possible peripheral nerve irritation more proximal.  R carpal tunnel injection performed today, detailed above. If she has full improvement can consider L carpal tunnel injection. If no complete improvement EMG should be considered  She will call after results of injection to schedule

## 2022-08-27 NOTE — Progress Notes (Signed)
New Patient Office Visit  Subjective   Patient ID: Sandra Lozano, female    DOB: 1961/08/26  Age: 61 y.o. MRN: [id number]  Bilateral carpal tunnel.  Sandra Lozano presents today with chief complaint of bilateral wrist/hand numbness that has been bothering her for the past 6 months.  Her pain is worse in the evenings which wakes her from sleep.  She is experiencing numbness and tingling in both of her hands, right greater than left encompassing all of her fingers on both the dorsum and volar aspects.  She has been sleeping in night splints, undergone occupational therapy, taking gabapentin.  Patient endorses some numbness and tingling up into her right forearm and decreased grip strength as she is noted she has been dropping more things lately.  She denies any injury to the area.   ROS as listed above in HPI    Objective:     BP 138/70   Ht 5\' 4"  (1.626 m)   Wt 221 lb (100.2 kg)   LMP  (LMP Unknown)   BMI 37.93 kg/m   Physical Exam Vitals reviewed.  Constitutional:      General: She is not in acute distress.    Appearance: Normal appearance. She is obese. She is not ill-appearing, toxic-appearing or diaphoretic.  Neurological:     Mental Status: She is alert.  Right hand: No obvious deformity or asymmetry.  No ecchymosis or edema.  No obvious atrophy of the thenar eminence.  No tenderness to palpation of the right hand however she does endorse some heightened sensation to light touch over her distal forearm.  She has equivocal grip strength bilaterally.  I was unable to break her pincer grasp.  No distinct reproduction of symptoms with Tinel's at the wrist or elbow. Left hand: No obvious deformity or asymmetry.  No ecchymosis or edema.  No obvious atrophy of the thenar eminence.  No tenderness to palpation of the left hand.  Sensation to light touch intact.  She has equivocal grip strength.  I was unable to break her pincer grasp.  No distinct reproduction of symptoms with Tinel's at the  wrist or elbow  Limited Ultrasound R carpal tunnel:  R medial nerve- visualized, measured to be 0.21cm2 L median nerve, visualized, measured to be 0.12cm2  Impression: Evidence of bilateral carpal tunnel based on swollen median nerve, R > L.     Assessment & Plan:   Problem List Items Addressed This Visit       Nervous and Auditory   Carpal tunnel syndrome, bilateral - Primary    B/l, R>L for >35mo despite conservative measures, cock up splints at night, OT, home exercises and medications- gabapentin.  She has some numbness and tingling affecting dorsal aspect of hand and ring and pinky finger, which does not fall into median nerve distribution, so it is possible peripheral nerve irritation more proximal.  R carpal tunnel injection performed today, detailed above. If she has full improvement can consider L carpal tunnel injection. If no complete improvement EMG should be considered  She will call after results of injection to schedule       After informed written consent timeout was performed, patient was seated on exam table.  Area overlying right carpal tunnel prepped with alcohol swab then utilizing ultrasound guidance, patient's right carpal tunnel injected with 1:1 lidocaine: depomedrol with hydrodissection of median nerve from the flexor retinaculum.  Patient tolerated procedure well without immediate complications.  Return if symptoms worsen or fail to improve.  Claudie Leach, DO  Addendum:  Patient seen in the office by fellow.  Her history, exam, plan of care were precepted with me. Present for and supervised guided injection above. Norton Blizzard MD Marrianne Mood

## 2022-08-28 ENCOUNTER — Encounter: Payer: Self-pay | Admitting: Sports Medicine

## 2022-09-03 ENCOUNTER — Ambulatory Visit (INDEPENDENT_AMBULATORY_CARE_PROVIDER_SITE_OTHER): Payer: Medicaid Other

## 2022-09-03 ENCOUNTER — Ambulatory Visit: Payer: Medicaid Other

## 2022-09-03 ENCOUNTER — Ambulatory Visit (INDEPENDENT_AMBULATORY_CARE_PROVIDER_SITE_OTHER): Payer: Medicaid Other | Admitting: Family

## 2022-09-03 ENCOUNTER — Other Ambulatory Visit: Payer: Self-pay | Admitting: Family

## 2022-09-03 ENCOUNTER — Telehealth: Payer: Self-pay | Admitting: Family

## 2022-09-03 ENCOUNTER — Encounter: Payer: Self-pay | Admitting: Family

## 2022-09-03 ENCOUNTER — Ambulatory Visit (INDEPENDENT_AMBULATORY_CARE_PROVIDER_SITE_OTHER): Payer: Medicaid Other | Admitting: Podiatry

## 2022-09-03 DIAGNOSIS — M779 Enthesopathy, unspecified: Secondary | ICD-10-CM

## 2022-09-03 DIAGNOSIS — M170 Bilateral primary osteoarthritis of knee: Secondary | ICD-10-CM | POA: Diagnosis not present

## 2022-09-03 DIAGNOSIS — R52 Pain, unspecified: Secondary | ICD-10-CM

## 2022-09-03 DIAGNOSIS — M79604 Pain in right leg: Secondary | ICD-10-CM | POA: Diagnosis not present

## 2022-09-03 DIAGNOSIS — M5416 Radiculopathy, lumbar region: Secondary | ICD-10-CM

## 2022-09-03 DIAGNOSIS — M7751 Other enthesopathy of right foot: Secondary | ICD-10-CM | POA: Diagnosis not present

## 2022-09-03 MED ORDER — PREDNISONE 50 MG PO TABS: ORAL_TABLET | ORAL | 0 refills | Status: DC

## 2022-09-03 MED ORDER — BETAMETHASONE SOD PHOS & ACET 6 (3-3) MG/ML IJ SUSP
3.0000 mg | Freq: Once | INTRAMUSCULAR | Status: AC
  Administered 2022-09-03: 3 mg via INTRA_ARTICULAR

## 2022-09-03 MED ORDER — METHYLPREDNISOLONE ACETATE 40 MG/ML IJ SUSP
40.0000 mg | INTRAMUSCULAR | Status: AC | PRN
  Administered 2022-09-03: 40 mg via INTRA_ARTICULAR

## 2022-09-03 MED ORDER — LIDOCAINE HCL 1 % IJ SOLN
5.0000 mL | INTRAMUSCULAR | Status: AC | PRN
  Administered 2022-09-03: 5 mL

## 2022-09-03 NOTE — Telephone Encounter (Signed)
Prednisone has been sent to [facility], pt informed.

## 2022-09-03 NOTE — Telephone Encounter (Signed)
Patient called asked if the Rx was sent into the pharmacy for her nerves?  Patient said uses [facility]. The number to contact patient is [phone]

## 2022-09-03 NOTE — Progress Notes (Signed)
   Chief Complaint  Patient presents with   Follow-up    Patient is here for right ankle pain that is going up the leg.    SUBJECTIVE Patient presenting today for new complaint of pain and tenderness associated to the lateral aspect of the right ankle joint that has been going on for several weeks.  She denies a history of injury.  Gradual onset.  She has not done anything for treatment.  Patient states that she currently takes meloxicam 15 mg daily for unrelated issues  Past Medical History:  Diagnosis Date   Arthritis    right knee, lower back   Dyspnea    very rare -tx with albuterol neb sol if needed   Hyperlipidemia    Hypertension    Pre-diabetes    per patient - does not check blood sugar   SVD (spontaneous vaginal delivery)    x 5 - only 2 living, 1 stillborn and 1 premature at 7 months demise,1 child deceased   Wears dentures    upper and lower    OBJECTIVE General Patient is awake, alert, and oriented x 3 and in no acute distress. Derm Skin is dry and supple bilateral. Negative open lesions or macerations. Remaining integument unremarkable. Nails are tender, long, thickened and dystrophic with subungual debris, consistent with onychomycosis, 1-5 bilateral. No signs of infection noted. Vasc  DP and PT pedal pulses palpable bilaterally. Temperature gradient within normal limits.  Neuro Epicritic and protective threshold sensation grossly intact bilaterally.  Musculoskeletal Exam pain on palpation noted to the lateral aspect of the right ankle joint  Radiographic RT ankle 09/03/2022 normal osseous mineralization.  Joint spaces preserved.  No acute fractures identified.  ASSESSMENT 1.  Capsulitis right ankle  PLAN OF CARE 1. Patient evaluated today.  2. Instructed to maintain good pedal hygiene and foot care.  3.  Injection of 0.5 cc Celestone Soluspan injected into the lateral aspect of the right ankle 4.  Cam boot dispensed.  Weightbearing as tolerated x3-4 weeks 5.   Return to clinic in 3-4 weeks to see if there is any improvement   Edrick Kins, DPM [facility]  Dr. Edrick Kins, DPM    [address], [place] [address]                Office [phone]  Fax [phone]

## 2022-09-03 NOTE — Progress Notes (Signed)
Office Visit Note   Patient: Sandra Lozano           Date of Birth: Sep 21, 1961           MRN: [id number] Visit Date: 09/03/2022              Requested by: [name], MD [address] [place],  [place] [address] PCP: [name], MD  Chief Complaint  Patient presents with   Right Knee - Pain   Left Knee - Pain   Lower Back - Pain      HPI: The patient is a 61 year old woman who presents today for 2 separate issues she is seen in follow-up for osteoarthritis bilateral knees this is ongoing her left knee bothers her more than her right she last had Depo-Medrol injections in her knees about 2 months ago this did offer her modest relief.  Unfortunately she has been unable to get prior authorization for supplemental injections  She also has been having issues with right-sided low back buttock and hip pain that radiates down to her at the knee on the lateral aspect of her leg she has worse pain with prolonged sitting especially riding in the car as well as weightbearing denies weakness no numbness no tingling  Assessment & Plan: Visit Diagnoses:  1. Pain in right leg     Plan: Depo-Medrol injection bilateral knees.  Patient tolerated well.  We will place her on a course of prednisone for her lumbar radiculopathy if we cannot get any relief consider epidural steroid injection  Consider referring back to Dr. Lorin Mercy who did her lumbar surgery  Follow-Up Instructions: No follow-ups on file.   Right Knee Exam   Muscle Strength  The patient has normal right knee strength.  Tenderness  The patient is experiencing tenderness in the medial joint line.  Range of Motion  The patient has normal right knee ROM.  Other  Effusion: no effusion present   Left Knee Exam   Muscle Strength  The patient has normal left knee strength.  Tenderness  The patient is experiencing tenderness in the medial joint line.  Range of Motion  The patient has normal left knee ROM.  Other   Swelling: moderate   Back Exam   Tenderness  The patient is experiencing no tenderness.   Range of Motion  The patient has normal back ROM.  Muscle Strength  The patient has normal back strength.  Tests  Straight leg raise right: positive  Other  Gait: abnormal   Comments:  Ambulates with a straight cane      Patient is alert, oriented, no adenopathy, well-dressed, normal affect, normal respiratory effort.   Imaging: No results found. No images are attached to the encounter.  Labs: Lab Results  Component Value Date   HGBA1C 6.4 08/19/2022   HGBA1C 6.1 08/22/2020   HGBA1C 6.3 11/01/2019   ESRSEDRATE 11 11/26/2021   LABURIC 5.2 11/26/2021     Lab Results  Component Value Date   ALBUMIN 4.0 05/18/2019    No results found for: "MG" No results found for: "VD25OH"  No results found for: "PREALBUMIN"    Latest Ref Rng & Units 11/02/2020    8:05 AM 05/21/2019    2:10 AM 05/18/2019   10:02 AM  CBC EXTENDED  WBC 4.0 - 10.5 K/uL  11.4  10.5   RBC 3.87 - 5.11 MIL/uL  3.35  4.78   Hemoglobin 12.0 - 15.0 g/dL 14.6  9.3  13.3   HCT  36.0 - 46.0 % 43.0  28.1  41.2   Platelets 150 - 400 K/uL  284  356      There is no height or weight on file to calculate BMI.  Orders:  Orders Placed This Encounter  Procedures   XR Lumbar Spine 2-3 Views   No orders of the defined types were placed in this encounter.    Procedures: Large Joint Inj: bilateral knee on 09/03/2022 9:39 AM Indications: pain Details: 18 G 1.5 in needle, anteromedial approach Medications (Right): 5 mL lidocaine 1 %; 40 mg methylPREDNISolone acetate 40 MG/ML Medications (Left): 5 mL lidocaine 1 %; 40 mg methylPREDNISolone acetate 40 MG/ML Consent was given by the patient.      Clinical Data: No additional findings.  ROS:  All other systems negative, except as noted in the HPI. Review of Systems  Objective: Vital Signs: LMP  (LMP Unknown)   Specialty Comments:  No specialty  comments available.  PMFS History: Patient Active Problem List   Diagnosis Date Noted   Left shoulder pain 08/25/2022   DJD (degenerative joint disease), multiple sites 08/06/2022   Intertrigo 08/06/2022   COPD, mild ([place]) 02/11/2022   Carpal tunnel syndrome, bilateral 12/21/2021   Arthritis of finger of left hand 11/26/2021   Chest wall pain 04/20/2021   Hot flashes 04/20/2021   Traumatic arthritis of left foot    Skin lesion of scalp 06/04/2020   Arthritis of midfoot 03/14/2020   History of lumbar fusion 03/14/2020   Neuropathy 11/07/2019   Pre-diabetes 11/07/2019   Nummular eczema 07/06/2019   Lumbar stenosis 05/20/2019   Acute left-sided low back pain without sciatica 07/18/2018   Osteoarthritis of right hip 06/03/2018   Chronic right shoulder pain 05/05/2018   Right hip pain 05/05/2018   Rupture of anterior cruciate ligament of right knee 02/15/2018   Sprain of medial collateral ligament of right knee 02/15/2018   Essential hypertension 02/15/2018   Past Medical History:  Diagnosis Date   Arthritis    right knee, lower back   Dyspnea    very rare -tx with albuterol neb sol if needed   Hyperlipidemia    Hypertension    Pre-diabetes    per patient - does not check blood sugar   SVD (spontaneous vaginal delivery)    x 5 - only 2 living, 1 stillborn and 1 premature at 7 months demise,1 child deceased   Wears dentures    upper and lower    Family History  Problem Relation Age of Onset   Cancer Sister    Healthy Daughter    Healthy Daughter     Past Surgical History:  Procedure Laterality Date   FOOT ARTHRODESIS Left 11/02/2020   Procedure: LEFT MIDFOOT FUSION BASE 1ST AND 2ND METATARSAL;  Surgeon: Newt Minion, MD;  Location: [facility];  Service: Orthopedics;  Laterality: Left;   LUMBAR FUSION  05/20/2019   GILL PROCEDURE, LEFT TRANSFORAMINAL LUMBAR INTERBODY FUSION, PEDICLE INSTRUMENTATION, BILATERAL FUSION (N/A   TUBAL LIGATION     Social History    Occupational History   Not on file  Tobacco Use   Smoking status: Former    Packs/day: 1.00    Years: 30.00    Total pack years: 30.00    Types: Cigarettes    Quit date: 01/2015    Years since quitting: 7.6    Passive exposure: Past   Smokeless tobacco: Never  Vaping Use   Vaping Use: Never used  Substance and Sexual Activity  Alcohol use: Not Currently   Drug use: Not Currently    Types: Marijuana    Comment: stopped 12/15/2021   Sexual activity: Not on file

## 2022-09-08 ENCOUNTER — Other Ambulatory Visit: Payer: Self-pay

## 2022-09-08 ENCOUNTER — Other Ambulatory Visit: Payer: Self-pay | Admitting: Family Medicine

## 2022-09-08 DIAGNOSIS — M25512 Pain in left shoulder: Secondary | ICD-10-CM

## 2022-09-08 MED ORDER — DICLOFENAC SODIUM 1 % EX GEL: 4.0000 g | Freq: Four times a day (QID) | CUTANEOUS | 0 refills | Status: DC

## 2022-09-14 DIAGNOSIS — Z419 Encounter for procedure for purposes other than remedying health state, unspecified: Secondary | ICD-10-CM | POA: Diagnosis not present

## 2022-09-19 ENCOUNTER — Other Ambulatory Visit: Payer: Self-pay

## 2022-09-19 ENCOUNTER — Telehealth: Payer: Self-pay | Admitting: Family

## 2022-09-19 DIAGNOSIS — R7303 Prediabetes: Secondary | ICD-10-CM

## 2022-09-19 NOTE — Telephone Encounter (Signed)
Last filled 09/03/22 #5

## 2022-09-19 NOTE — Telephone Encounter (Signed)
Pt called requesting a refill of prednisone. Please send to [facility]. Pt phone number is [phone].

## 2022-09-22 ENCOUNTER — Other Ambulatory Visit: Payer: Self-pay | Admitting: Family

## 2022-09-22 MED ORDER — SEMAGLUTIDE(0.25 OR 0.5MG/DOS) 2 MG/1.5ML ~~LOC~~ SOPN: 0.2500 mg | PEN_INJECTOR | SUBCUTANEOUS | 0 refills | Status: DC

## 2022-09-23 NOTE — Telephone Encounter (Signed)
Cannot continue to refill, consider esi referral or seeing yates

## 2022-09-24 ENCOUNTER — Other Ambulatory Visit: Payer: Self-pay | Admitting: *Deleted

## 2022-09-24 ENCOUNTER — Other Ambulatory Visit: Payer: Self-pay | Admitting: Family

## 2022-09-24 DIAGNOSIS — M5416 Radiculopathy, lumbar region: Secondary | ICD-10-CM

## 2022-09-24 NOTE — Patient Outreach (Signed)
Medicaid Managed Care   Nurse Care Manager Note  09/24/2022 Name:  Sandra Lozano MRN:  [id number] DOB:  [birth year]  Sandra Lozano is an 61 y.o. year old female who is a primary patient of Sandra Ram, MD.  The [facility] Managed Care Coordination team was consulted for assistance with:    HTN Chronic pain  [name] was given information about Medicaid Managed Care Coordination team services today. [name] Patient agreed to services and verbal consent obtained.  Engaged with patient by telephone for follow up visit in response to provider referral for case management and/or care coordination services.   Assessments/Interventions:  Review of past medical history, allergies, medications, health status, including review of consultants reports, laboratory and other test data, was performed as part of comprehensive evaluation and provision of chronic care management services.  SDOH (Social Determinants of Health) assessments and interventions performed: SDOH Interventions    Flowsheet Row Patient Outreach Telephone from 09/24/2022 in [place] Patient Outreach Telephone from 08/05/2021 in [place] Coordination  SDOH Interventions    Housing Interventions Intervention Not Indicated --  Transportation Interventions Intervention Not Indicated Intervention Not Indicated  Physical Activity Interventions -- Intervention Not Indicated  Social Connections Interventions -- Intervention Not Indicated       Care Plan  No Known Allergies  Medications Reviewed Today     Reviewed by Melissa Montane, RN (Registered Nurse) on 09/24/22 at [place] List Status: <None>   Medication Order Taking? Sig Documenting Provider Last Dose Status Informant  Accu-Chek Softclix Lancets lancets [id number] Yes Use to check blood sugar once a day Sandra Ram, MD Taking Active   Acetaminophen (TYLENOL 8 HOUR ARTHRITIS PAIN PO) [id number]  Yes Take by mouth. [provider] Taking Active   acetaminophen (TYLENOL) 500 MG tablet [id number] Yes Take 1 tablet (500 mg total) by mouth every 6 (six) hours as needed. Sandra Ram, MD Taking Active   aspirin EC 325 MG tablet [id number] Yes Take 650 mg by mouth daily. Takes every morning [provider] Taking Active Self  atorvastatin (LIPITOR) 20 MG tablet [id number] Yes Take 1 tablet (20 mg total) by mouth every evening. Sandra Ram, MD Taking Active   betamethasone acetate-betamethasone sodium phosphate (CELESTONE) injection 3 mg [id number]   Edrick Kins, DPM  Active   Blood Glucose Monitoring Suppl w/Device KIT [id number] Yes 1 kit by Does not apply route daily. [name], DO Taking Active   Blood Pressure Monitoring (BLOOD PRESSURE CUFF) MISC [id number] Yes Check your blood pressure in the morning when you wake up, and at night before you go to bed Autry-Lott, Naaman Plummer, DO Taking Active   cetirizine (ZYRTEC) 10 MG tablet [id number] Yes Take 1 tablet (10 mg total) by mouth daily. Autry-Lott, Naaman Plummer, DO Taking Active   diclofenac Sodium (VOLTAREN) 1 % GEL [id number] Yes Apply 4 g topically 4 (four) times daily. Sandra Ram, MD Taking Active   diclofenac Sodium (VOLTAREN) 1 % GEL [id number] Yes APPLY 4 GRAMS TOPICALLY 4 (FOUR) TIMES DAILY. Sandra Ram, MD Taking Active   fluticasone Asencion Islam) 50 MCG/ACT nasal spray [id number] Yes Place 2 sprays into both nostrils daily. Autry-Lott, Naaman Plummer, DO Taking Active   gabapentin (NEURONTIN) 600 MG tablet [id number] Yes Take 1 tablet (600 mg total) by mouth at bedtime. Sandra Ram, MD Taking Active   glucose blood test strip [id number] Yes Please use to check blood sugar once daily. Sandra Ram, MD Taking Active   lisinopril-hydrochlorothiazide (ZESTORETIC)  20-12.5 MG tablet [id number] Yes Take 2 tablets by mouth daily. Sandra Ram, MD Taking Active   meloxicam [facility]) 15 MG tablet [id number] Yes Take 1 tablet (15 mg  total) by mouth daily. Sandra Ram, MD Taking Active   metFORMIN (GLUCOPHAGE) 500 MG tablet [id number] Yes Take 1 tablet (500 mg total) by mouth daily with breakfast. Sandra Ram, MD Taking Active   methylPREDNISolone acetate (DEPO-MEDROL) injection 40 mg [id number]   Rodena Goldmann A, DO  Active   Omega-3 Fatty Acids (FISH OIL) 1000 MG CAPS [id number] Yes Take 1 capsule (1,000 mg total) by mouth at bedtime. Autry-Lott, Naaman Plummer, DO Taking Active   predniSONE (DELTASONE) 50 MG tablet [id number] No Take one tablet daily in the morning with breakfast for 5 days  Patient not taking: Reported on 09/24/2022   Suzan Slick, NP Not Taking Active            Med Note (Ranika Mcniel A   Wed Sep 24, 2022 11:35 AM) completed  Semaglutide,0.25 or 0.5MG/DOS, 2 MG/1.5ML SOPN [id number] Yes Inject 0.25 mg into the skin once a week. 0.25 mg once weekly for 4 weeks PLEASE MAKE APPOINTMENT TO INCREASE DOSE Sandra Ram, MD Taking Active   terbinafine (LAMISIL) 1 % cream [id number] Yes Apply 1 Application topically 2 (two) times daily. Lind Covert, MD Taking Active   triamcinolone ointment (KENALOG) 0.1 % [id number] Yes Apply 1 Application topically 2 (two) times daily. Apply to scalp. Don't use more than 2 weeks in a row without physician approval. Sandra Ram, MD Taking Active             Patient Active Problem List   Diagnosis Date Noted   Left shoulder pain 08/25/2022   DJD (degenerative joint disease), multiple sites 08/06/2022   Intertrigo 08/06/2022   COPD, mild ([place]) 02/11/2022   Carpal tunnel syndrome, bilateral 12/21/2021   Arthritis of finger of left hand 11/26/2021   Chest wall pain 04/20/2021   Hot flashes 04/20/2021   Traumatic arthritis of left foot    Skin lesion of scalp 06/04/2020   Arthritis of midfoot 03/14/2020   History of lumbar fusion 03/14/2020   Neuropathy 11/07/2019   Pre-diabetes 11/07/2019   Nummular eczema 07/06/2019   Lumbar stenosis 05/20/2019    Acute left-sided low back pain without sciatica 07/18/2018   Osteoarthritis of right hip 06/03/2018   Chronic right shoulder pain 05/05/2018   Right hip pain 05/05/2018   Rupture of anterior cruciate ligament of right knee 02/15/2018   Sprain of medial collateral ligament of right knee 02/15/2018   Essential hypertension 02/15/2018    Conditions to be addressed/monitored per PCP order:  HTN and chronic pain  Care Plan : [place]  Updates made by Melissa Montane, RN since 09/24/2022 12:00 AM     Problem: Chronic Disease Management and Care Coordination Needs realted to HTN, prediabetes, and chronic pain   Priority: High     Long-Range Goal: Development of Plan Of Care For Chronic Disease Management and Care Coordination Needs To Assist With Meeting Treatment Goals for HTN, prediabetes and chronic pain and HLD   Start Date: 08/05/2021  Expected End Date: 11/23/2022  Priority: High  Note:   Current Barriers:  Chronic Disease Management support and education needs related to HTN, prediabetes and chronic pain, HLD RNCM Clinical Goal(s):  Patient will verbalize understanding of plan for management of HTN , prediabetes and chronic pain through collaboration with RN Care manager, provider, and care  team.   Interventions: Inter-disciplinary care team collaboration (see longitudinal plan of care) Evaluation of current treatment plan related to  self management and patient's adherence to plan as established by provider Discussed Dermatology referral, patient has appointment on 08/22/22  Advised patient to request medications to be refilled for 90 day supply   Hypertension: (Status: Goal on Track (progressing): YES.)Long Term- per patient >75% of BP readings at home, she attributes higher readings at provider's office due to pain. BP at home today 128/70 Last practice recorded BP readings:  BP Readings from Last 3 Encounters:  08/27/22 138/70  08/19/22 135/70  08/06/22  (!) 141/61        Most recent eGFR/CrCl:  Lab Results  Component Value Date   EGFR 99 10/30/2021    No components found for: "CRCL"  Evaluation of current treatment plan related to hypertension self management and patient's adherence to plan as established by provider;   Reviewed medications with patient and discussed importance of compliance;  Assessed frequency of home BP monitoring and variance of home BP readings Advised patient to take her BP monitor to her next office appointment to check accuracy Assessed BP readings in provider's office via EMR review Discussed plans with patient for ongoing care management follow up and provided patient with direct contact information for care management team;   Pre- Diabetes Interventions:  (Status:  Goal Met.) Long Term Goal-  Assessed patient's understanding of A1c goal:  <6.4% Reviewed medications with patient and discussed importance of medication adherence Counseled on importance of regular laboratory monitoring as prescribed Advised patient, providing education and rationale, to check cbg (every morning -fasting)  and record, calling PCP for findings outside established parameters Review of patient status, including review of consultants reports, relevant laboratory and other test results, and medications completed Encouraged patient to continue compliance with diabetic diet, monitoring BS and taking her medications Lab Results  Component Value Date   HGBA1C 6.4 08/19/2022    Lab Results  Component Value Date   CHOL 159 02/05/2022   HDL 57 02/05/2022   LDLCALC 81 02/05/2022   TRIG 117 02/05/2022   CHOLHDL 2.8 02/05/2022      Pain Interventions:  (Status:  Goal on track:  Yes.) Long Term Goal Medications reviewed, discussed taking meloxicam in the morning instead of at night Reviewed provider established plan for pain management Discussed importance of adherence to all scheduled medical appointments Counseled on the importance  of reporting any/all new or changed pain symptoms or management strategies to pain management provider Advised patient to report to care team affect of pain on daily activities Assessed social determinant of health barriers Discussed recent prednisone prescription, explained that this prescription was intended for a short period of time-not to take every day Encouraged patient to maintain her walking regimen(walking one mile a day) Advised patient to contact [employer] for enhanced benefits, such as, gym membership, $10 monthly items and $250 to help with utilities and rent   Patient Goals/Self-Care Activities: Patient will self administer medications as prescribed Patient will attend all scheduled provider appointments Patient will call pharmacy for medication refills Patient will continue to perform ADL's independently Patient will continue to perform IADL's independently Patient will call provider office for new concerns or questions        Follow Up:  Patient agrees to Care Plan and Follow-up.  Plan: The Managed Medicaid care management team will reach out to the patient again over the next 30 days.  Date/time of next  scheduled RN care management/care coordination outreach:  10/29/22 @ 11:15am  Lurena Joiner RN, BSN [place]  [employer] manager

## 2022-09-24 NOTE — Patient Instructions (Signed)
Visit Information  Sandra Lozano was given information about Medicaid Managed Care team care coordination services as a part of their [facility] Medicaid benefit. Sandra Lozano verbally consented to engagement with the [facility] Managed Care team.   If you are experiencing a medical emergency, please call 911 or report to your local emergency department or urgent care.   If you have a non-emergency medical problem during routine business hours, please contact your provider's office and ask to speak with a nurse.   For questions related to your [facility] health plan, please call: [phone] or go here:[email or web address]  If you would like to schedule transportation through your [facility] plan, please call the following number at least 2 days in advance of your appointment: [phone].  You can also use the MTM portal or MTM mobile app to manage your rides. For the portal, please go to [email or web address].cy.  Call the [place] at [phone], at any time, 24 hours a day, 7 days a week. If you are in danger or need immediate medical attention call 911.  If you would like help to quit smoking, call 1-800-QUIT-NOW 680 067 3106) OR Espaol: 1-855-Djelo-Ya (6-256-389-3734) o para ms informacin haga clic aqu or Text READY to 200-400 to register via text  Sandra Lozano,   Please see education materials related to arthritis provided by MyChart link.  Patient verbalizes understanding of instructions and care plan provided today and agrees to view in [place]. Active MyChart status and patient understanding of how to access instructions and care plan via MyChart confirmed with patient.     Telephone follow up appointment with Managed Medicaid care management team member scheduled for:10/29/22 @ 11:15am  Lurena Joiner RN, BSN [place] RN Care Coordinator   Following is a copy of your plan of care:  Care Plan : [place]  Updates made by Melissa Montane, RN since 09/24/2022 12:00 AM     Problem: Chronic Disease Management and Care Coordination Needs realted to HTN, prediabetes, and chronic pain   Priority: High     Long-Range Goal: Development of Plan Of Care For Chronic Disease Management and Care Coordination Needs To Assist With Meeting Treatment Goals for HTN, prediabetes and chronic pain and HLD   Start Date: 08/05/2021  Expected End Date: 11/23/2022  Priority: High  Note:   Current Barriers:  Chronic Disease Management support and education needs related to HTN, prediabetes and chronic pain, HLD RNCM Clinical Goal(s):  Patient will verbalize understanding of plan for management of HTN , prediabetes and chronic pain through collaboration with RN Care manager, provider, and care team.   Interventions: Inter-disciplinary care team collaboration (see longitudinal plan of care) Evaluation of current treatment plan related to  self management and patient's adherence to plan as established by provider Discussed Dermatology referral, patient has appointment on 08/22/22  Advised patient to request medications to be refilled for 90 day supply   Hypertension: (Status: Goal on Track (progressing): YES.)Long Term- per patient >75% of BP readings at home, she attributes higher readings at provider's office due to pain. BP at home today 128/70 Last practice recorded BP readings:  BP Readings from Last 3 Encounters:  08/27/22 138/70  08/19/22 135/70  08/06/22 (!) 141/61        Most recent eGFR/CrCl:  Lab Results  Component Value Date   EGFR 99 10/30/2021    No components found for: "CRCL"  Evaluation of current treatment plan related to hypertension  self management and patient's adherence to plan as established by provider;   Reviewed medications with patient and discussed importance of compliance;  Assessed frequency of home BP monitoring and variance of home BP readings Advised patient to  take her BP monitor to her next office appointment to check accuracy Assessed BP readings in provider's office via EMR review Discussed plans with patient for ongoing care management follow up and provided patient with direct contact information for care management team;   Pre- Diabetes Interventions:  (Status:  Goal Met.) Long Term Goal-  Assessed patient's understanding of A1c goal:  <6.4% Reviewed medications with patient and discussed importance of medication adherence Counseled on importance of regular laboratory monitoring as prescribed Advised patient, providing education and rationale, to check cbg (every morning -fasting)  and record, calling PCP for findings outside established parameters Review of patient status, including review of consultants reports, relevant laboratory and other test results, and medications completed Encouraged patient to continue compliance with diabetic diet, monitoring BS and taking her medications Lab Results  Component Value Date   HGBA1C 6.4 08/19/2022    Lab Results  Component Value Date   CHOL 159 02/05/2022   HDL 57 02/05/2022   LDLCALC 81 02/05/2022   TRIG 117 02/05/2022   CHOLHDL 2.8 02/05/2022      Pain Interventions:  (Status:  Goal on track:  Yes.) Long Term Goal Medications reviewed, discussed taking meloxicam in the morning instead of at night Reviewed provider established plan for pain management Discussed importance of adherence to all scheduled medical appointments Counseled on the importance of reporting any/all new or changed pain symptoms or management strategies to pain management provider Advised patient to report to care team affect of pain on daily activities Assessed social determinant of health barriers Discussed recent prednisone prescription, explained that this prescription was intended for a short period of time-not to take every day Encouraged patient to maintain her walking regimen(walking one mile a day) Advised  patient to contact [employer] for enhanced benefits, such as, gym membership, $10 monthly items and $250 to help with utilities and rent   Patient Goals/Self-Care Activities: Patient will self administer medications as prescribed Patient will attend all scheduled provider appointments Patient will call pharmacy for medication refills Patient will continue to perform ADL's independently Patient will continue to perform IADL's independently Patient will call provider office for new concerns or questions

## 2022-09-24 NOTE — Telephone Encounter (Signed)
Pt would like to do an ESI prior to seeing Dr. Lorin Mercy at this time. If not relief she will see Dr. Lorin Mercy. I will place referral for ESI with Dr. Ernestina Patches

## 2022-10-01 ENCOUNTER — Ambulatory Visit (INDEPENDENT_AMBULATORY_CARE_PROVIDER_SITE_OTHER): Payer: Medicaid Other

## 2022-10-01 ENCOUNTER — Ambulatory Visit (INDEPENDENT_AMBULATORY_CARE_PROVIDER_SITE_OTHER): Payer: Medicaid Other | Admitting: Physical Medicine and Rehabilitation

## 2022-10-01 ENCOUNTER — Ambulatory Visit (INDEPENDENT_AMBULATORY_CARE_PROVIDER_SITE_OTHER): Payer: Medicaid Other | Admitting: Podiatry

## 2022-10-01 ENCOUNTER — Encounter: Payer: Self-pay | Admitting: Physical Medicine and Rehabilitation

## 2022-10-01 VITALS — BP 193/72 | HR 66

## 2022-10-01 DIAGNOSIS — M7752 Other enthesopathy of left foot: Secondary | ICD-10-CM

## 2022-10-01 DIAGNOSIS — M7751 Other enthesopathy of right foot: Secondary | ICD-10-CM | POA: Diagnosis not present

## 2022-10-01 DIAGNOSIS — M4726 Other spondylosis with radiculopathy, lumbar region: Secondary | ICD-10-CM

## 2022-10-01 DIAGNOSIS — M5441 Lumbago with sciatica, right side: Secondary | ICD-10-CM | POA: Diagnosis not present

## 2022-10-01 DIAGNOSIS — G8929 Other chronic pain: Secondary | ICD-10-CM

## 2022-10-01 DIAGNOSIS — M5416 Radiculopathy, lumbar region: Secondary | ICD-10-CM

## 2022-10-01 DIAGNOSIS — M961 Postlaminectomy syndrome, not elsewhere classified: Secondary | ICD-10-CM

## 2022-10-01 MED ORDER — BETAMETHASONE SOD PHOS & ACET 6 (3-3) MG/ML IJ SUSP
3.0000 mg | Freq: Once | INTRAMUSCULAR | Status: AC
  Administered 2022-10-01: 3 mg via INTRA_ARTICULAR

## 2022-10-01 MED ORDER — DIAZEPAM 5 MG PO TABS: ORAL_TABLET | ORAL | 0 refills | Status: DC

## 2022-10-01 NOTE — Progress Notes (Signed)
Sandra Lozano - 61 y.o. female MRN [id number]  Date of birth: 1961-11-30  Office Visit Note: Visit Date: 10/01/2022 PCP: Sandra Ram, MD Referred by: Sandra Ram, MD  Subjective: Chief Complaint  Patient presents with   Lower Back - Pain   HPI: Sandra Lozano is a 61 y.o. female who comes in today per the request of Sandra Prader, NP for evaluation of chronic, worsening and severe right sided lower back pain radiating to buttock, hip and down lateral leg to foot. Pain ongoing for 4 months. Pain exacerbated by standing, walking and prolonged sitting. States her pain becomes severe when pushing down on accelerator while driving.  She describes pain as sore, aching, burning and cramping, currently rates as 8 out of 10. Some relief of pain with home exercise regimen, rest and use of medications. Currently taking Meloxicam and Tylenol. Lumbar MRI imaging from 2019 exhibits 7 mm anterolisthesis, severe facet arthrosis and severe spinal canal stenosis at L4-L5. Patient underwent left L5-S1 interlaminar epidural steroid injection in our office in January 2020 with minimal relief of pain.   Patient did undergo L4-L5 lumbar fusion by Sandra Lozano in June 2020. Significant relief of left sided radicular symptoms post surgery for several years, reports pain has gradually started to return over the last several months and is now more right sided. Recent lumbar x-rays exhibit stable alignment of lumbar fusion hardware, no loosening. 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 Patient denies focal weakness, numbness and tingling. Patient denies recent trauma or falls.    Review of Systems  Musculoskeletal:  Positive for back pain.  Neurological:  Negative for tingling, sensory change, focal weakness and weakness.  All other systems reviewed and are negative.  Otherwise per HPI.  Assessment & Plan: Visit Diagnoses:    ICD-10-CM   1.  Post laminectomy syndrome  M96.1 Ambulatory referral to Physical Medicine Rehab    2. Lumbar radiculopathy  M54.16 Ambulatory referral to Physical Medicine Rehab    3. Other spondylosis with radiculopathy, lumbar region  M47.26 Ambulatory referral to Physical Medicine Rehab    4. Chronic right-sided low back pain with right-sided sciatica  M54.41 Ambulatory referral to Physical Medicine Rehab   [phone]        Plan: Findings:  Chronic, worsening and severe right sided lower back pain radiating to buttock, hip and down lateral leg to foot. Patient continues to have severe pain despite good conservative therapies such as home exercise regimen, rest and use of medications. Patients clinical presentation and exam are consistent with L5 nerve pattern. Next step is to perform diagnotic and hopefully therapeutic right L5 transforaminal epidural steroid injection under fluoroscopic guidance. Patient did voice concerns related to anxiety surrounding injection procedure. I did prescribe pre-procedure Valium for her to take on the day of injection.  If significant relief of pain with injection we can repeat infrequently as needed. If her pain persists post injection we would be quick to obtain new lumbar MRI imaging to assess for adjacent level disease. We also discussed re-grouping with Sandra Lozano to discuss options. No red flag symptoms noted upon exam today.     Meds & Orders:  Meds ordered this encounter  Medications   diazepam (VALIUM) 5 MG tablet    Sig: Take one tablet by mouth with food one hour prior to procedure. May repeat 30 minutes prior if needed.    Dispense:  2 tablet    Refill:  0    Orders Placed This Encounter  Procedures   Ambulatory referral to Physical Medicine Rehab    Follow-up: Return for Right L5 transforaminal epidural steroid injection.   Procedures: No procedures performed      Clinical History: EXAM: MRI LUMBAR SPINE WITHOUT CONTRAST   TECHNIQUE: Multiplanar,  multisequence MR imaging of the lumbar spine was performed. No intravenous contrast was administered.   COMPARISON:  Lumbar radiographs 09/28/2018   FINDINGS: Segmentation:  Normal   Alignment:  7 mm anterolisthesis L4-5.  Remaining alignment normal   Vertebrae: Negative for fracture or mass. Bone marrow edema at L4-5 related to discogenic change.   Conus medullaris and cauda equina: Conus extends to the L1-2 level. Conus and cauda equina appear normal.   Paraspinal and other soft tissues: Negative for paraspinous mass or fluid collection   Disc levels:   L1-2: Negative   L2-3: Negative   L3-4: Mild facet degeneration. Negative for disc protrusion or stenosis   L4-5: 7 mm anterolisthesis. Severe facet degeneration bilaterally. Severe disc degeneration with endplate erosive change and edema. Severe spinal stenosis. Severe subarticular foraminal stenosis left greater than right   L5-S1: Negative   IMPRESSION: Severe spinal stenosis L4-5 with severe subarticular foraminal stenosis bilaterally left greater than right. Grade 1 anterolisthesis L4-5. Remaining lumbar spine negative     Electronically Signed   By: Sandra Lozano M.D.   On: 11/13/2018 15:34   She reports that she quit smoking about 7 years ago. Her smoking use included cigarettes. She has a 30.00 pack-year smoking history. She has been exposed to tobacco smoke. She has never used smokeless tobacco.  Recent Labs    11/26/21 1643 08/19/22 1555  HGBA1C  --  6.4  LABURIC 5.2  --     Objective:  VS:  HT:    WT:   BMI:     BP:(!) 193/72  HR:66bpm  TEMP: ( )  RESP:  Physical Exam Vitals and nursing note reviewed.  HENT:     Head: Normocephalic and atraumatic.     Right Ear: External ear normal.     Left Ear: External ear normal.     Nose: Nose normal.     Mouth/Throat:     Mouth: Mucous membranes are moist.  Eyes:     Extraocular Movements: Extraocular movements intact.  Cardiovascular:      Rate and Rhythm: Normal rate.     Pulses: Normal pulses.  Pulmonary:     Effort: Pulmonary effort is normal.  Abdominal:     General: Abdomen is flat. There is no distension.  Musculoskeletal:        General: Tenderness present.     Cervical back: Normal range of motion.     Comments: Pt rises from seated position to standing without difficulty. Good lumbar range of motion. Strong distal strength without clonus, no pain upon palpation of greater trochanters. Dysesthesias noted to right L5 dermatome. Sensation intact bilaterally. Walks independently, gait steady. Positive slump test on the right.    Skin:    General: Skin is warm and dry.     Capillary Refill: Capillary refill takes less than 2 seconds.  Neurological:     General: No focal deficit present.     Mental Status: She is alert and oriented to person, place, and time.  Psychiatric:        Mood and Affect: Mood normal.        Behavior: Behavior normal.     Ortho Exam  Imaging: No results found.  Past Medical/Family/Surgical/Social History: Medications & Allergies reviewed per EMR, new medications updated. Patient Active Problem List   Diagnosis Date Noted   Left shoulder pain 08/25/2022   DJD (degenerative joint disease), multiple sites 08/06/2022   Intertrigo 08/06/2022   COPD, mild ([place]) 02/11/2022   Carpal tunnel syndrome, bilateral 12/21/2021   Arthritis of finger of left hand 11/26/2021   Chest wall pain 04/20/2021   Hot flashes 04/20/2021   Traumatic arthritis of left foot    Skin lesion of scalp 06/04/2020   Arthritis of midfoot 03/14/2020   History of lumbar fusion 03/14/2020   Neuropathy 11/07/2019   Pre-diabetes 11/07/2019   Nummular eczema 07/06/2019   Lumbar stenosis 05/20/2019   Acute left-sided low back pain without sciatica 07/18/2018   Osteoarthritis of right hip 06/03/2018   Chronic right shoulder pain 05/05/2018   Right hip pain 05/05/2018   Rupture of anterior cruciate ligament of right  knee 02/15/2018   Sprain of medial collateral ligament of right knee 02/15/2018   Essential hypertension 02/15/2018   Past Medical History:  Diagnosis Date   Arthritis    right knee, lower back   Dyspnea    very rare -tx with albuterol neb sol if needed   Hyperlipidemia    Hypertension    Pre-diabetes    per patient - does not check blood sugar   SVD (spontaneous vaginal delivery)    x 5 - only 2 living, 1 stillborn and 1 premature at 7 months demise,1 child deceased   Wears dentures    upper and lower   Family History  Problem Relation Age of Onset   Cancer Sister    Healthy Daughter    Healthy Daughter    Past Surgical History:  Procedure Laterality Date   FOOT ARTHRODESIS Left 11/02/2020   Procedure: LEFT MIDFOOT FUSION BASE 1ST AND 2ND METATARSAL;  Surgeon: Newt Minion, MD;  Location: [facility];  Service: Orthopedics;  Laterality: Left;   LUMBAR FUSION  05/20/2019   GILL PROCEDURE, LEFT TRANSFORAMINAL LUMBAR INTERBODY FUSION, PEDICLE INSTRUMENTATION, BILATERAL FUSION (N/A   TUBAL LIGATION     Social History   Occupational History   Not on file  Tobacco Use   Smoking status: Former    Packs/day: 1.00    Years: 30.00    Total pack years: 30.00    Types: Cigarettes    Quit date: 01/2015    Years since quitting: 7.7    Passive exposure: Past   Smokeless tobacco: Never  Vaping Use   Vaping Use: Never used  Substance and Sexual Activity   Alcohol use: Not Currently   Drug use: Not Currently    Types: Marijuana    Comment: stopped 12/15/2021   Sexual activity: Not on file

## 2022-10-01 NOTE — Progress Notes (Signed)
Numeric Pain Rating Scale and Functional Assessment Average Pain 5   In the last MONTH (on 0-10 scale) has pain interfered with the following?  1. General activity like being  able to carry out your everyday physical activities such as walking, climbing stairs, carrying groceries, or moving a chair?  Rating(9)   +Driver, -BT, -Dye Allergies.  Pain radiates into right leg all the way down. Has cramping in right calf and a pulling in the right shin. Takes Meloxicam and Tylenol for pain

## 2022-10-01 NOTE — Progress Notes (Signed)
   Chief Complaint  Patient presents with   Follow-up    4 wk  R ankle pain f/u/ left toe pain, patient states that she is still having pain she states that the injection helped a lot along wit compression sleeve.    SUBJECTIVE Patient presenting today for follow-up of right ankle pain that has been going on for several weeks.  She says the injection helped significantly.  She does have some tenderness residual to the right ankle.  She continues to take meloxicam 15 mg daily.  No new complaints at this time  Past Medical History:  Diagnosis Date   Arthritis    right knee, lower back   Dyspnea    very rare -tx with albuterol neb sol if needed   Hyperlipidemia    Hypertension    Pre-diabetes    per patient - does not check blood sugar   SVD (spontaneous vaginal delivery)    x 5 - only 2 living, 1 stillborn and 1 premature at 7 months demise,1 child deceased   Wears dentures    upper and lower    OBJECTIVE General Patient is awake, alert, and oriented x 3 and in no acute distress. Derm Skin is dry and supple bilateral. Negative open lesions or macerations. Remaining integument unremarkable. Nails are tender, long, thickened and dystrophic with subungual debris, consistent with onychomycosis, 1-5 bilateral. No signs of infection noted. Vasc  DP and PT pedal pulses palpable bilaterally. Temperature gradient within normal limits.  Neuro Epicritic and protective threshold sensation grossly intact bilaterally.  Musculoskeletal Exam pain on palpation noted to the lateral aspect of the right ankle joint  Radiographic RT ankle 09/03/2022 normal osseous mineralization.  Joint spaces preserved.  No acute fractures identified. Radiographic exam LT foot 10/01/2022 no acute fractures identified.  There is some irregularity around the PIP joint of the left third digit which correlates with pain and tenderness with palpation clinically.  No osseous abnormality however there is possibly old healed  fracture which is intra-articular into the PIPJ of the head of the proximal phalanx  ASSESSMENT 1.  Capsulitis right ankle 2.  Pain/arthritic changes to the third toe left foot  PLAN OF CARE 1. Patient evaluated today.  2. Instructed to maintain good pedal hygiene and foot care.  3.  Injection of 0.5 cc Celestone Soluspan injected into the lateral aspect of the right ankle again today 4.  Patient may discontinue the cam boot 5.  Recommend good supportive shoes and sneakers .  Return to clinic as needed   Edrick Kins, DPM [facility]  Dr. Edrick Kins, DPM    [address], [place] [address]                Office [phone]  Fax [phone]

## 2022-10-08 ENCOUNTER — Ambulatory Visit: Payer: Medicaid Other | Admitting: Podiatry

## 2022-10-09 ENCOUNTER — Telehealth: Payer: Self-pay | Admitting: Physical Medicine and Rehabilitation

## 2022-10-09 ENCOUNTER — Encounter: Payer: Self-pay | Admitting: Family Medicine

## 2022-10-09 ENCOUNTER — Ambulatory Visit (INDEPENDENT_AMBULATORY_CARE_PROVIDER_SITE_OTHER): Payer: Medicaid Other | Admitting: Family Medicine

## 2022-10-09 VITALS — BP 127/80 | HR 88 | Temp 98.4°F | Ht 64.0 in | Wt 215.2 lb

## 2022-10-09 DIAGNOSIS — Z791 Long term (current) use of non-steroidal anti-inflammatories (NSAID): Secondary | ICD-10-CM

## 2022-10-09 DIAGNOSIS — Z23 Encounter for immunization: Secondary | ICD-10-CM

## 2022-10-09 DIAGNOSIS — Z1231 Encounter for screening mammogram for malignant neoplasm of breast: Secondary | ICD-10-CM

## 2022-10-09 DIAGNOSIS — E119 Type 2 diabetes mellitus without complications: Secondary | ICD-10-CM

## 2022-10-09 DIAGNOSIS — Z111 Encounter for screening for respiratory tuberculosis: Secondary | ICD-10-CM | POA: Diagnosis not present

## 2022-10-09 DIAGNOSIS — Z122 Encounter for screening for malignant neoplasm of respiratory organs: Secondary | ICD-10-CM | POA: Diagnosis not present

## 2022-10-09 DIAGNOSIS — L989 Disorder of the skin and subcutaneous tissue, unspecified: Secondary | ICD-10-CM

## 2022-10-09 DIAGNOSIS — R7303 Prediabetes: Secondary | ICD-10-CM | POA: Diagnosis not present

## 2022-10-09 MED ORDER — KETOCONAZOLE 2 % EX SHAM: 1.0000 | MEDICATED_SHAMPOO | CUTANEOUS | 0 refills | Status: DC

## 2022-10-09 MED ORDER — SEMAGLUTIDE(0.25 OR 0.5MG/DOS) 2 MG/1.5ML ~~LOC~~ SOPN: 0.5000 mg | PEN_INJECTOR | SUBCUTANEOUS | 3 refills | Status: DC

## 2022-10-09 NOTE — Progress Notes (Signed)
    SUBJECTIVE:   CHIEF COMPLAINT / HPI:   Prediabetes/Well Controlled Diabetes  Semaglutide has been taking every week and has had no side effects. Would like to increase dose. She still feels like she has a lot of weight on her and was surprised that her weight had decreased.  Walking with her friend every day, even when her friend cannot walk she walks by herself. She feels like now she can get up and out of a chair much easier than she could before.  Prepping healthy food options.   Scalp lesions  She mentions that she has continued to use her triamcinolone cream that she was prescribed earlier for interigo  on her scalp because of her scalp lesions. She says that she has had these flaky scalp lesions that cause her hair loss.   PERTINENT  PMH / PSH: Prediabetes, Arthritis, HTN, Obesity   OBJECTIVE:   BP 127/80   Pulse 88   Temp 98.4 F (36.9 C)   Ht 5\' 4"  (1.626 m)   Wt 215 lb 3.2 oz (97.6 kg)   LMP  (LMP Unknown)   SpO2 98%   BMI 36.94 kg/m   General: well appearing, pleasant  CV: well perfused, RRR  Resp: normal work of breathing on room air  Abd: Soft, non tender, non distended  MSK: Mobility much improved from previous visit  Scalp: large flakes and visible miniaturization of follicles at bases, some crusting present as well  ASSESSMENT/PLAN:   Well controlled diabetes mellitus ([place]) Assessment & Plan: Patient continues to be well controlled. Last A1c 6.4 last month while on metformin. Now started Ozempic and will continue to increase therapy. She is already on statin per guidelines for those with diabetes.  - BMP today  - Follow up in 3 months   Orders: -     Semaglutide(0.25 or 0.5MG /DOS); Inject 0.5 mg into the skin once a week.  Dispense: 3 mL; Refill: 3  Skin lesion of scalp Assessment & Plan: Scalp lesions look like seborrheic dermatitis that patient has picked at. No ulcerations or bleeding, mostly flaky skin. Triamcinolone ointment has been helping.   - hair care education provided   Orders: -     Ketoconazole; Apply 1 Application topically 2 (two) times a week.  Dispense: 120 mL; Refill: 0   Health Maintenance Screening for tuberculosis -     Quantiferon tb gold assay (blood)  NSAID long-term use -     Basic metabolic panel  Screening mammogram for breast cancer -     Digital Screening Mammogram, Left and Right; Future Screening for lung cancer  Has not smoked in 7 years.  -     CT CHEST LUNG CANCER SCREENING LOW DOSE WO CONTRAST; Future  Need for immunization against influenza -     Flu Vaccine QUAD 74mo+IM (Fluarix, Fluzone & Alfiuria Quad PF)   [name], MD [place]

## 2022-10-09 NOTE — Assessment & Plan Note (Signed)
Patient continues to be well controlled. Last A1c 6.4 last month while on metformin. Now started Ozempic and will continue to increase therapy. She is already on statin per guidelines for those with diabetes.  - BMP today  - Follow up in 3 months

## 2022-10-09 NOTE — Telephone Encounter (Signed)
Spoke with patient via telephone this afternoon, she has undergone physician directed home exercise regimen for 6 weeks or more. She does walk frequently, most days at least 1-3 miles.  She continues to have severe pain despite good conservative therapies.

## 2022-10-09 NOTE — Patient Instructions (Addendum)
It was wonderful to see you today.  Please bring ALL of your medications with you to every visit.   Today we talked about: Prediabetes - I sent in an increased dose of ozempic. Please continue this for 4 weeks and then we will increase the dose again as long as you are not having side effects. Continue walking and meal prepping.  Scalp lesion - Please use the ketaconazole shampoo that I am prescribing. Use twice weekly.   I recommend you undergo a mammogram.   You can call to schedule an appointment by calling [phone].   Directions [place], [place] [address]  Please let me know if you have questions. I will send you a letter or call you with results.     Please follow up in 1 months   Thank you for choosing [facility].   Please call [phone] with any questions about today's appointment.  Please be sure to schedule follow up at the front  desk before you leave today.   [name], MD  Family Medicine

## 2022-10-09 NOTE — Assessment & Plan Note (Signed)
Scalp lesions look like seborrheic dermatitis that patient has picked at. No ulcerations or bleeding, mostly flaky skin. Triamcinolone ointment has been helping.  - hair care education provided

## 2022-10-10 ENCOUNTER — Telehealth: Payer: Self-pay

## 2022-10-10 DIAGNOSIS — E119 Type 2 diabetes mellitus without complications: Secondary | ICD-10-CM

## 2022-10-10 NOTE — Telephone Encounter (Signed)
Patient calls nurse line in regards to Ozempic dosage.   Patient reports she has been on 0.5mg  for over one month and was told yesterday she could increase to 1mg . However, 0.5mg  was sent to the pharmacy.   Please advise on dose and change prescription if needed.

## 2022-10-13 ENCOUNTER — Encounter (INDEPENDENT_AMBULATORY_CARE_PROVIDER_SITE_OTHER): Payer: Self-pay

## 2022-10-13 ENCOUNTER — Other Ambulatory Visit: Payer: Self-pay | Admitting: Family Medicine

## 2022-10-13 DIAGNOSIS — G5601 Carpal tunnel syndrome, right upper limb: Secondary | ICD-10-CM

## 2022-10-13 LAB — BASIC METABOLIC PANEL
BUN/Creatinine Ratio: 22 (ref 12–28)
BUN: 15 mg/dL (ref 8–27)
CO2: 25 mmol/L (ref 20–29)
Calcium: 10.1 mg/dL (ref 8.7–10.3)
Chloride: 101 mmol/L (ref 96–106)
Creatinine, Ser: 0.68 mg/dL (ref 0.57–1.00)
Glucose: 108 mg/dL — ABNORMAL HIGH (ref 70–99)
Potassium: 4.3 mmol/L (ref 3.5–5.2)
Sodium: 140 mmol/L (ref 134–144)
eGFR: 98 mL/min/{1.73_m2} (ref >59)

## 2022-10-13 LAB — QUANTIFERON-TB GOLD PLUS
QuantiFERON Mitogen Value: >10 IU/mL
QuantiFERON Nil Value: 0.01 IU/mL
QuantiFERON TB1 Ag Value: 0.01 IU/mL
QuantiFERON TB2 Ag Value: 0 IU/mL
QuantiFERON-TB Gold Plus: NEGATIVE

## 2022-10-13 MED ORDER — SEMAGLUTIDE (1 MG/DOSE) 4 MG/3ML ~~LOC~~ SOPN: 1.0000 mg | PEN_INJECTOR | SUBCUTANEOUS | 0 refills | Status: DC

## 2022-10-13 NOTE — Progress Notes (Signed)
BMP normal with elevated glucose as expected with well controlled diabetes  Quantiferon gold (TB test) negative

## 2022-10-14 ENCOUNTER — Telehealth: Payer: Self-pay

## 2022-10-14 NOTE — Telephone Encounter (Signed)
Patient aware.

## 2022-10-14 NOTE — Telephone Encounter (Signed)
Patient is not available and will call to reschedule.  Sandra Lozano, [place]

## 2022-10-15 DIAGNOSIS — Z419 Encounter for procedure for purposes other than remedying health state, unspecified: Secondary | ICD-10-CM | POA: Diagnosis not present

## 2022-10-24 ENCOUNTER — Ambulatory Visit (HOSPITAL_COMMUNITY): Payer: Medicaid Other

## 2022-10-29 ENCOUNTER — Ambulatory Visit: Payer: Medicaid Other | Admitting: *Deleted

## 2022-10-31 ENCOUNTER — Ambulatory Visit (HOSPITAL_COMMUNITY)
Admission: RE | Admit: 2022-10-31 | Discharge: 2022-10-31 | Disposition: A | Discharge status: Home / Self Care | Payer: Medicaid Other | Source: Ambulatory Visit | Attending: Family Medicine | Admitting: Family Medicine

## 2022-10-31 DIAGNOSIS — Z122 Encounter for screening for malignant neoplasm of respiratory organs: Secondary | ICD-10-CM | POA: Diagnosis not present

## 2022-10-31 DIAGNOSIS — Z87891 Personal history of nicotine dependence: Secondary | ICD-10-CM | POA: Diagnosis not present

## 2022-11-02 ENCOUNTER — Encounter: Payer: Self-pay | Admitting: Family Medicine

## 2022-11-03 ENCOUNTER — Other Ambulatory Visit: Payer: Medicaid Other | Admitting: *Deleted

## 2022-11-03 ENCOUNTER — Encounter: Payer: Self-pay | Admitting: *Deleted

## 2022-11-03 NOTE — Patient Outreach (Signed)
Medicaid Managed Care   Nurse Care Manager Note  11/03/2022 Name:  Sandra Lozano MRN:  [id number] DOB:  13-Dec-1961  Sandra Lozano is an 61 y.o. year old female who is a primary patient of [name], MD.  The [facility] Managed Care Coordination team was consulted for assistance with:    HTN Pain  Ms. Stanfield was given information about Medicaid Managed Care Coordination team services today. [name] Patient agreed to services and verbal consent obtained.  Engaged with patient by telephone for follow up visit in response to provider referral for case management and/or care coordination services.   Assessments/Interventions:  Review of past medical history, allergies, medications, health status, including review of consultants reports, laboratory and other test data, was performed as part of comprehensive evaluation and provision of chronic care management services.  SDOH (Social Determinants of Health) assessments and interventions performed: SDOH Interventions    Flowsheet Row Patient Outreach Telephone from 11/03/2022 in [place] Patient Outreach Telephone from 09/24/2022 in [place] Coordination Patient Outreach Telephone from 08/05/2021 in [place] Interventions     Food Insecurity Interventions Other (Comment)  [Referral to BSW] -- --  Housing Interventions -- Intervention Not Indicated --  Transportation Interventions -- Intervention Not Indicated Intervention Not Indicated  Physical Activity Interventions -- -- Intervention Not Indicated  Social Connections Interventions -- -- Intervention Not Indicated       Care Plan  No Known Allergies  Medications Reviewed Today     Reviewed by Melissa Montane, RN (Registered Nurse) on 11/03/22 at 1250  Med List Status: <None>   Medication Order Taking? Sig Documenting Provider Last Dose Status Informant  Accu-Chek  Softclix Lancets lancets [id number] Yes Use to check blood sugar once a day [name], MD Taking Active   Acetaminophen (TYLENOL 8 HOUR ARTHRITIS PAIN PO) [id number] Yes Take by mouth. [provider] Taking Active   acetaminophen (TYLENOL) 500 MG tablet [id number]  Take 1 tablet (500 mg total) by mouth every 6 (six) hours as needed.  Patient not taking: Reported on 10/09/2022   [name], MD  Active   aspirin EC 325 MG tablet [id number] No Take 650 mg by mouth daily. Takes every morning  Patient not taking: Reported on 10/09/2022   [provider] Not Taking Active Self  atorvastatin (LIPITOR) 20 MG tablet [id number] Yes Take 1 tablet (20 mg total) by mouth every evening. [name], MD Taking Active   betamethasone acetate-betamethasone sodium phosphate (CELESTONE) injection 3 mg [id number]   Edrick Kins, DPM  Active   Blood Glucose Monitoring Suppl w/Device KIT [id number] Yes 1 kit by Does not apply route daily. [name], DO Taking Active   Blood Pressure Monitoring (BLOOD PRESSURE CUFF) MISC [id number] Yes Check your blood pressure in the morning when you wake up, and at night before you go to bed Autry-Lott, Naaman Plummer, DO Taking Active   cetirizine (ZYRTEC) 10 MG tablet [id number] No Take 1 tablet (10 mg total) by mouth daily.  Patient not taking: Reported on 10/09/2022   [name], DO Not Taking Active   diazepam (VALIUM) 5 MG tablet [id number] No Take one tablet by mouth with food one hour prior to procedure. May repeat 30 minutes prior if needed.  Patient not taking: Reported on 10/09/2022   Lorine Bears, NP Not Taking Active   diclofenac Sodium (VOLTAREN) 1 % GEL [id number]  Apply 4 g topically 4 (four) times  daily. [name], MD  Active   diclofenac Sodium (VOLTAREN) 1 % GEL [id number] Yes APPLY 4 GRAMS TOPICALLY 4 (FOUR) TIMES DAILY. [name], MD Taking Active   fluticasone Asencion Islam) 50 MCG/ACT nasal spray [id number] Yes Place 2  sprays into both nostrils daily. Autry-Lott, Naaman Plummer, DO Taking Active   gabapentin (NEURONTIN) 600 MG tablet [id number] Yes Take 1 tablet (600 mg total) by mouth at bedtime. [name], MD Taking Active   glucose blood test strip [id number] Yes Please use to check blood sugar once daily. [name], MD Taking Active   ketoconazole (NIZORAL) 2 % shampoo [id number] Yes Apply 1 Application topically 2 (two) times a week. [name], MD Taking Active   lisinopril-hydrochlorothiazide (ZESTORETIC) 20-12.5 MG tablet [id number] Yes Take 2 tablets by mouth daily. [name], MD Taking Active   meloxicam (MOBIC) 15 MG tablet [id number] Yes TAKE 1 TABLET (15 MG TOTAL) BY MOUTH DAILY. [name], MD Taking Active   metFORMIN (GLUCOPHAGE) 500 MG tablet [id number] Yes Take 1 tablet (500 mg total) by mouth daily with breakfast. [name], MD Taking Active   methylPREDNISolone acetate (DEPO-MEDROL) injection 40 mg [id number]   Rodena Goldmann A, DO  Active   Omega-3 Fatty Acids (FISH OIL) 1000 MG CAPS [id number] Yes Take 1 capsule (1,000 mg total) by mouth at bedtime. Autry-Lott, Naaman Plummer, DO Taking Active   predniSONE (DELTASONE) 50 MG tablet [id number]  Take one tablet daily in the morning with breakfast for 5 days  Patient not taking: Reported on 10/09/2022   Suzan Slick, NP  Active            Med Note ([place] Keena A   Wed Sep 24, 2022 11:35 AM) completed  Semaglutide, 1 MG/DOSE, 4 MG/3ML SOPN [id number] Yes Inject 1 mg into the skin once a week. [name], MD Taking Active   terbinafine (LAMISIL) 1 % cream [id number] No Apply 1 Application topically 2 (two) times daily.  Patient not taking: Reported on 10/09/2022   Lind Covert, MD Not Taking Active   triamcinolone ointment (KENALOG) 0.1 % [id number] Yes Apply 1 Application topically 2 (two) times daily. Apply to scalp. Don't use more than 2 weeks in a row without physician approval. [name], MD Taking Active              Patient Active Problem List   Diagnosis Date Noted   Left shoulder pain 08/25/2022   DJD (degenerative joint disease), multiple sites 08/06/2022   COPD, mild ([place]) 02/11/2022   Carpal tunnel syndrome, bilateral 12/21/2021   Arthritis of finger of left hand 11/26/2021   Hot flashes 04/20/2021   Traumatic arthritis of left foot    Skin lesion of scalp 06/04/2020   Arthritis of midfoot 03/14/2020   History of lumbar fusion 03/14/2020   Neuropathy 11/07/2019   Well controlled diabetes mellitus ([place]) 11/07/2019   Nummular eczema 07/06/2019   Lumbar stenosis 05/20/2019   Acute left-sided low back pain without sciatica 07/18/2018   Osteoarthritis of right hip 06/03/2018   Chronic right shoulder pain 05/05/2018   Essential hypertension 02/15/2018    Conditions to be addressed/monitored per PCP order:  HTN and Pain  Care Plan : [place]  Updates made by Melissa Montane, RN since 11/03/2022 12:00 AM     Problem: Chronic Disease Management and Care Coordination Needs realted to HTN, prediabetes, and chronic pain   Priority: High     Long-Range Goal: Development of Plan Of Care For  Chronic Disease Management and Care Coordination Needs To Assist With Meeting Treatment Goals for HTN, prediabetes and chronic pain and HLD   Start Date: 08/05/2021  Expected End Date: 12/03/2022  Priority: High  Note:   Current Barriers:  Chronic Disease Management support and education needs related to HTN, prediabetes and chronic pain, HLD RNCM Clinical Goal(s):  Patient will verbalize understanding of plan for management of HTN , prediabetes and chronic pain through collaboration with RN Care manager, provider, and care team.   Interventions: Inter-disciplinary care team collaboration (see longitudinal plan of care) Evaluation of current treatment plan related to  self management and patient's adherence to plan as established by provider BSW referral for food  insecurity   Hypertension: (Status: Goal on Track (progressing): YES.)Long Term- per patient >75% of BP readings at home, she attributes higher readings at provider's office due to pain. BP at home today 128/70 Last practice recorded BP readings:  BP Readings from Last 3 Encounters:  10/09/22 127/80  10/01/22 (!) 193/72  08/27/22 138/70        Most recent eGFR/CrCl:  Lab Results  Component Value Date   EGFR 98 10/09/2022    No components found for: "CRCL"  Evaluation of current treatment plan related to hypertension self management and patient's adherence to plan as established by provider;   Reviewed medications with patient and discussed importance of compliance;  Advised patient to take her BP monitor to her next office appointment to check accuracy Assessed BP readings in provider's office via EMR review Discussed plans with patient for ongoing care management follow up and provided patient with direct contact information for care management team; Provided education on managing HTN via MyChart   Pain Interventions:  (Status:  Goal on track:  Yes.) Long Term Goal-pain 5/10 Medications reviewed Reviewed provider established plan for pain management Discussed importance of adherence to all scheduled medical appointments Counseled on the importance of reporting any/all new or changed pain symptoms or management strategies to pain management provider Advised patient to report to care team affect of pain on daily activities Assessed social determinant of health barriers Advised patient to contact [place] for enhanced benefits, such as, gym membership, $10 monthly items and $250 to help with utilities and rent-revisited Discussed upcoming appointment for back injection on 11/12/22 Advised to call Podiatrist for an appointment to assess possible ingrown toenail   Patient Goals/Self-Care Activities: Patient will self administer medications as prescribed Patient will  attend all scheduled provider appointments Patient will call pharmacy for medication refills Patient will continue to perform ADL's independently Patient will continue to perform IADL's independently Patient will call provider office for new concerns or questions        Follow Up:  Patient agrees to Care Plan and Follow-up.  Plan: The Managed Medicaid care management team will reach out to the patient again over the next 30 days.  Date/time of next scheduled RN care management/care coordination outreach:  12/03/22 @ [place] RN, [place] RN Care Coordinator

## 2022-11-03 NOTE — Patient Instructions (Signed)
Visit Information  Ms. Samek was given information about Medicaid Managed Care team care coordination services as a part of their [facility] Medicaid benefit. Mylasia Vorhees verbally consented to engagement with the [facility] Managed Care team.   If you are experiencing a medical emergency, please call 911 or report to your local emergency department or urgent care.   If you have a non-emergency medical problem during routine business hours, please contact your provider's office and ask to speak with a nurse.   For questions related to your [facility] health plan, please call: [phone] or go here:[email or web address]  If you would like to schedule transportation through your [facility] plan, please call the following number at least 2 days in advance of your appointment: [phone].  You can also use the MTM portal or MTM mobile app to manage your rides. For the portal, please go to [email or web address].  Call the [name] at [phone], at any time, 24 hours a day, 7 days a week. If you are in danger or need immediate medical attention call 911.  If you would like help to quit smoking, call 1-800-QUIT-NOW 253-643-8940) OR Espaol: 1-855-Djelo-Ya (4-132-440-1027) o para ms informacin haga clic aqu or Text READY to 200-400 to register via text  Ms. Dasher,   Please see education materials related to HTN provided by MyChart link.  Patient verbalizes understanding of instructions and care plan provided today and agrees to view in [place]. Active MyChart status and patient understanding of how to access instructions and care plan via MyChart confirmed with patient.     Telephone follow up appointment with Managed Medicaid care management team member scheduled for:12/03/22 @ [place] RN, BSN [place] RN Care Coordinator   Following is a copy of your plan of care:  Care Plan : [place]   Updates made by Melissa Montane, RN since 11/03/2022 12:00 AM     Problem: Chronic Disease Management and Care Coordination Needs realted to HTN, prediabetes, and chronic pain   Priority: High     Long-Range Goal: Development of Plan Of Care For Chronic Disease Management and Care Coordination Needs To Assist With Meeting Treatment Goals for HTN, prediabetes and chronic pain and HLD   Start Date: 08/05/2021  Expected End Date: 12/03/2022  Priority: High  Note:   Current Barriers:  Chronic Disease Management support and education needs related to HTN, prediabetes and chronic pain, HLD RNCM Clinical Goal(s):  Patient will verbalize understanding of plan for management of HTN , prediabetes and chronic pain through collaboration with RN Care manager, provider, and care team.   Interventions: Inter-disciplinary care team collaboration (see longitudinal plan of care) Evaluation of current treatment plan related to  self management and patient's adherence to plan as established by provider BSW referral for food insecurity   Hypertension: (Status: Goal on Track (progressing): YES.)Long Term- per patient >75% of BP readings at home, she attributes higher readings at provider's office due to pain. BP at home today 128/70 Last practice recorded BP readings:  BP Readings from Last 3 Encounters:  10/09/22 127/80  10/01/22 (!) 193/72  08/27/22 138/70        Most recent eGFR/CrCl:  Lab Results  Component Value Date   EGFR 98 10/09/2022    No components found for: "CRCL"  Evaluation of current treatment plan related to hypertension self management and patient's adherence to plan as established by provider;   Reviewed medications with  patient and discussed importance of compliance;  Advised patient to take her BP monitor to her next office appointment to check accuracy Assessed BP readings in provider's office via EMR review Discussed plans with patient for ongoing care management follow up  and provided patient with direct contact information for care management team; Provided education on managing HTN via MyChart   Pain Interventions:  (Status:  Goal on track:  Yes.) Long Term Goal-pain 5/10 Medications reviewed Reviewed provider established plan for pain management Discussed importance of adherence to all scheduled medical appointments Counseled on the importance of reporting any/all new or changed pain symptoms or management strategies to pain management provider Advised patient to report to care team affect of pain on daily activities Assessed social determinant of health barriers Advised patient to contact [place] for enhanced benefits, such as, gym membership, $10 monthly items and $250 to help with utilities and rent-revisited Discussed upcoming appointment for back injection on 11/12/22 Advised to call Podiatrist for an appointment to assess possible ingrown toenail   Patient Goals/Self-Care Activities: Patient will self administer medications as prescribed Patient will attend all scheduled provider appointments Patient will call pharmacy for medication refills Patient will continue to perform ADL's independently Patient will continue to perform IADL's independently Patient will call provider office for new concerns or questions

## 2022-11-05 ENCOUNTER — Other Ambulatory Visit: Payer: Medicaid Other

## 2022-11-05 NOTE — Patient Instructions (Signed)
Visit Information  Sandra Lozano was given information about Medicaid Managed Care team care coordination services as a part of their [facility] Medicaid benefit. Sandra Lozano verbally consented to engagement with the [facility] Managed Care team.   If you are experiencing a medical emergency, please call 911 or report to your local emergency department or urgent care.   If you have a non-emergency medical problem during routine business hours, please contact your provider's office and ask to speak with a nurse.   For questions related to your [facility] health plan, please call: [phone] or go here:[email or web address]  If you would like to schedule transportation through your [facility] plan, please call the following number at least 2 days in advance of your appointment: [phone].  You can also use the MTM portal or MTM mobile app to manage your rides. For the portal, please go to [email or web address]  Call the [facility] Crisis Line at [phone], at any time, 24 hours a day, 7 days a week. If you are in danger or need immediate medical attention call 911.  If you would like help to quit smoking, call 1-800-QUIT-NOW ((820)705-4944) OR Espaol: 1-855-Djelo-Ya (3-734-287-6811) o para ms informacin haga clic aqu or Text READY to 572-620 to register via text  Sandra Lozano - following are the goals we discussed in your visit today:   Goals Addressed   None     Social Worker will follow up on 12/04/22.   Sandra Lozano, BSW, [facility]  [place]  High Risk Managed Medicaid Team  [phone]   Following is a copy of your plan of care:  There are no care plans that you recently modified to display for this patient.

## 2022-11-05 NOTE — Patient Outreach (Signed)
Medicaid Managed Care Social Work Note  11/05/2022 Name:  Sandra Lozano MRN:  [id number] DOB:  28-May-1961  Sandra Lozano is an 61 y.o. year old female who is a primary patient of [name], MD.  The [facility] Managed Care Coordination team was consulted for assistance with:  Food Insecurity  Ms. Stauffer was given information about Medicaid Managed Care Coordination team services today. [name] Patient agreed to services and verbal consent obtained.  Engaged with patient  for by telephone forinitial visit  in response to referral for case management and/or care coordination services.   Assessments/Interventions:  Review of past medical history, allergies, medications, health status, including review of consultants reports, laboratory and other test data, was performed as part of comprehensive evaluation and provision of chronic care management services.  SDOH: (Social Determinant of Health) assessments and interventions performed: SDOH Interventions    Flowsheet Row Patient Outreach Telephone from 11/03/2022 in [place] Patient Outreach Telephone from 09/24/2022 in [place] Patient Outreach Telephone from 08/05/2021 in [place] Coordination  SDOH Interventions     Food Insecurity Interventions Other (Comment)  [Referral to BSW] -- --  Housing Interventions -- Intervention Not Indicated --  Transportation Interventions -- Intervention Not Indicated Intervention Not Indicated  Physical Activity Interventions -- -- Intervention Not Indicated  Social Connections Interventions -- -- Intervention Not Indicated     BSW completed a telephone outreach with patient. She states she does receive $23 in foodstamps each month but it is not enough to get her through the month. 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 Patient asked for food resources to be emailed to her at  [email or web address]. No other resources are needed at this time.  Advanced Directives Status:  Not addressed in this encounter.  Care Plan                 No Known Allergies  Medications Reviewed Today     Reviewed by Melissa Montane, RN (Registered Nurse) on 11/03/22 at 1250  Med List Status: <None>   Medication Order Taking? Sig Documenting Provider Last Dose Status Informant  Accu-Chek Softclix Lancets lancets [id number] Yes Use to check blood sugar once a day [name], MD Taking Active   Acetaminophen (TYLENOL 8 HOUR ARTHRITIS PAIN PO) [id number] Yes Take by mouth. [provider] Taking Active   acetaminophen (TYLENOL) 500 MG tablet [id number]  Take 1 tablet (500 mg total) by mouth every 6 (six) hours as needed.  Patient not taking: Reported on 10/09/2022   [name], MD  Active   aspirin EC 325 MG tablet [id number] No Take 650 mg by mouth daily. Takes every morning  Patient not taking: Reported on 10/09/2022   [provider] Not Taking Active Self  atorvastatin (LIPITOR) 20 MG tablet [id number] Yes Take 1 tablet (20 mg total) by mouth every evening. [name], MD Taking Active   betamethasone acetate-betamethasone sodium phosphate (CELESTONE) injection 3 mg [id number]   Edrick Kins, DPM  Active   Blood Glucose Monitoring Suppl w/Device KIT [id number] Yes 1 kit by Does not apply route daily. [name], DO Taking Active   Blood Pressure Monitoring (BLOOD PRESSURE CUFF) MISC [id number] Yes Check your blood pressure in the morning when you wake up, and at night before you go to bed Autry-Lott, Naaman Plummer, DO Taking Active   cetirizine (ZYRTEC) 10 MG tablet [id number] No Take 1 tablet (10 mg total) by mouth daily.  Patient not taking: Reported on 10/09/2022   [name], DO Not Taking Active   diazepam (VALIUM) 5 MG tablet [id number] No Take one tablet by mouth with food one hour prior to procedure. May repeat 30 minutes prior if needed.  Patient  not taking: Reported on 10/09/2022   Lorine Bears, NP Not Taking Active   diclofenac Sodium (VOLTAREN) 1 % GEL [id number]  Apply 4 g topically 4 (four) times daily. [name], MD  Active   diclofenac Sodium (VOLTAREN) 1 % GEL [id number] Yes APPLY 4 GRAMS TOPICALLY 4 (FOUR) TIMES DAILY. [name], MD Taking Active   fluticasone Asencion Islam) 50 MCG/ACT nasal spray [id number] Yes Place 2 sprays into both nostrils daily. Autry-Lott, Naaman Plummer, DO Taking Active   gabapentin (NEURONTIN) 600 MG tablet [id number] Yes Take 1 tablet (600 mg total) by mouth at bedtime. [name], MD Taking Active   glucose blood test strip [id number] Yes Please use to check blood sugar once daily. [name], MD Taking Active   ketoconazole (NIZORAL) 2 % shampoo [id number] Yes Apply 1 Application topically 2 (two) times a week. [name], MD Taking Active   lisinopril-hydrochlorothiazide (ZESTORETIC) 20-12.5 MG tablet [id number] Yes Take 2 tablets by mouth daily. [name], MD Taking Active   meloxicam (MOBIC) 15 MG tablet [id number] Yes TAKE 1 TABLET (15 MG TOTAL) BY MOUTH DAILY. [name], MD Taking Active   metFORMIN (GLUCOPHAGE) 500 MG tablet [id number] Yes Take 1 tablet (500 mg total) by mouth daily with breakfast. [name], MD Taking Active   methylPREDNISolone acetate (DEPO-MEDROL) injection 40 mg [id number]   Rodena Goldmann A, DO  Active   Omega-3 Fatty Acids (FISH OIL) 1000 MG CAPS [id number] Yes Take 1 capsule (1,000 mg total) by mouth at bedtime. Autry-Lott, Naaman Plummer, DO Taking Active   predniSONE (DELTASONE) 50 MG tablet [id number]  Take one tablet daily in the morning with breakfast for 5 days  Patient not taking: Reported on 10/09/2022   Suzan Slick, NP  Active            Med Note (ROBB, MELANIE A   Wed Sep 24, 2022 11:35 AM) completed  Semaglutide, 1 MG/DOSE, 4 MG/3ML SOPN [id number] Yes Inject 1 mg into the skin once a week. [name], MD Taking Active   terbinafine  (LAMISIL) 1 % cream [id number] No Apply 1 Application topically 2 (two) times daily.  Patient not taking: Reported on 10/09/2022   Lind Covert, MD Not Taking Active   triamcinolone ointment (KENALOG) 0.1 % [id number] Yes Apply 1 Application topically 2 (two) times daily. Apply to scalp. Don't use more than 2 weeks in a row without physician approval. [name], MD Taking Active             Patient Active Problem List   Diagnosis Date Noted   Left shoulder pain 08/25/2022   DJD (degenerative joint disease), multiple sites 08/06/2022   COPD, mild ([place]) 02/11/2022   Carpal tunnel syndrome, bilateral 12/21/2021   Arthritis of finger of left hand 11/26/2021   Hot flashes 04/20/2021   Traumatic arthritis of left foot    Skin lesion of scalp 06/04/2020   Arthritis of midfoot 03/14/2020   History of lumbar fusion 03/14/2020   Neuropathy 11/07/2019   Well controlled diabetes mellitus ([place]) 11/07/2019   Nummular eczema 07/06/2019   Lumbar stenosis 05/20/2019   Acute left-sided low back pain without sciatica 07/18/2018   Osteoarthritis of right hip 06/03/2018   Chronic right shoulder pain 05/05/2018  Essential hypertension 02/15/2018    Conditions to be addressed/monitored per PCP order:   food resources  There are no care plans that you recently modified to display for this patient.   Follow up:  Patient agrees to Care Plan and Follow-up.  Plan: The Managed Medicaid care management team will reach out to the patient again over the next 30 days.  Date/time of next scheduled Social Work care management/care coordination outreach:  12/04/22  Mickel Fuchs, Arita Miss, [place] Managed Medicaid Team  [phone]

## 2022-11-10 ENCOUNTER — Other Ambulatory Visit: Payer: Self-pay

## 2022-11-10 ENCOUNTER — Encounter: Payer: Self-pay | Admitting: Family Medicine

## 2022-11-10 DIAGNOSIS — E785 Hyperlipidemia, unspecified: Secondary | ICD-10-CM

## 2022-11-10 DIAGNOSIS — E119 Type 2 diabetes mellitus without complications: Secondary | ICD-10-CM

## 2022-11-10 MED ORDER — SEMAGLUTIDE (2 MG/DOSE) 8 MG/3ML ~~LOC~~ SOPN: 2.0000 mg | PEN_INJECTOR | SUBCUTANEOUS | 0 refills | Status: DC

## 2022-11-10 MED ORDER — ATORVASTATIN CALCIUM 20 MG PO TABS: 20.0000 mg | ORAL_TABLET | Freq: Every evening | ORAL | 0 refills | Status: DC

## 2022-11-10 MED ORDER — SEMAGLUTIDE (1 MG/DOSE) 4 MG/3ML ~~LOC~~ SOPN: 1.0000 mg | PEN_INJECTOR | SUBCUTANEOUS | 0 refills | Status: DC

## 2022-11-10 NOTE — Addendum Note (Signed)
Addended by: Lockie Mola on: 11/10/2022 07:07 PM   Modules accepted: Orders

## 2022-11-12 ENCOUNTER — Ambulatory Visit (INDEPENDENT_AMBULATORY_CARE_PROVIDER_SITE_OTHER): Payer: Medicaid Other | Admitting: Physical Medicine and Rehabilitation

## 2022-11-12 ENCOUNTER — Ambulatory Visit: Payer: Self-pay

## 2022-11-12 VITALS — BP 114/66 | HR 62

## 2022-11-12 DIAGNOSIS — M5416 Radiculopathy, lumbar region: Secondary | ICD-10-CM | POA: Diagnosis not present

## 2022-11-12 MED ORDER — METHYLPREDNISOLONE ACETATE 80 MG/ML IJ SUSP
80.0000 mg | Freq: Once | INTRAMUSCULAR | Status: AC
  Administered 2022-11-12: 80 mg

## 2022-11-12 NOTE — Patient Instructions (Signed)

## 2022-11-12 NOTE — Progress Notes (Signed)
Numeric Pain Rating Scale and Functional Assessment Average Pain 0   In the last MONTH (on 0-10 scale) has pain interfered with the following?  1. General activity like being  able to carry out your everyday physical activities such as walking, climbing stairs, carrying groceries, or moving a chair?  Rating(8)   +Driver, -BT, -Dye Allergies.  Pain in right leg that goes from hip to knee. Feels like a strain going up the leg

## 2022-11-14 DIAGNOSIS — Z419 Encounter for procedure for purposes other than remedying health state, unspecified: Secondary | ICD-10-CM | POA: Diagnosis not present

## 2022-11-17 ENCOUNTER — Encounter: Payer: Self-pay | Admitting: Physical Medicine and Rehabilitation

## 2022-11-19 ENCOUNTER — Encounter: Payer: Self-pay | Admitting: Family

## 2022-11-19 ENCOUNTER — Telehealth: Payer: Self-pay

## 2022-11-19 ENCOUNTER — Encounter: Payer: Self-pay | Admitting: Podiatry

## 2022-11-19 ENCOUNTER — Ambulatory Visit (INDEPENDENT_AMBULATORY_CARE_PROVIDER_SITE_OTHER): Payer: Medicaid Other | Admitting: Family

## 2022-11-19 ENCOUNTER — Ambulatory Visit (INDEPENDENT_AMBULATORY_CARE_PROVIDER_SITE_OTHER): Payer: Medicaid Other | Admitting: Podiatry

## 2022-11-19 VITALS — BP 162/82 | HR 117

## 2022-11-19 DIAGNOSIS — R52 Pain, unspecified: Secondary | ICD-10-CM

## 2022-11-19 DIAGNOSIS — M1711 Unilateral primary osteoarthritis, right knee: Secondary | ICD-10-CM | POA: Diagnosis not present

## 2022-11-19 DIAGNOSIS — M170 Bilateral primary osteoarthritis of knee: Secondary | ICD-10-CM

## 2022-11-19 DIAGNOSIS — L600 Ingrowing nail: Secondary | ICD-10-CM

## 2022-11-19 MED ORDER — LIDOCAINE HCL 1 % IJ SOLN
5.0000 mL | INTRAMUSCULAR | Status: AC | PRN
  Administered 2022-11-19: 5 mL

## 2022-11-19 MED ORDER — METHYLPREDNISOLONE ACETATE 40 MG/ML IJ SUSP
40.0000 mg | INTRAMUSCULAR | Status: AC | PRN
  Administered 2022-11-19: 40 mg via INTRA_ARTICULAR

## 2022-11-19 NOTE — Telephone Encounter (Signed)
-----   Message from Adonis Huguenin, NP sent at 11/19/2022  1:47 PM EST ----- Can we resubmit her supp inj for ri knee

## 2022-11-19 NOTE — Progress Notes (Signed)
   Chief Complaint  Patient presents with   Ingrown Toenail    Patient is here for left foot ingrown toenail.    Subjective: Patient presents today for evaluation of a new complaint of pain to the medial border right great toe. Patient is concerned for possible ingrown nail.  It is very sensitive to touch.  Patient presents today for further treatment and evaluation.  Past Medical History:  Diagnosis Date   Arthritis    right knee, lower back   Chest wall pain 04/20/2021   Dyspnea    very rare -tx with albuterol neb sol if needed   Hyperlipidemia    Hypertension    Intertrigo 08/06/2022   Pre-diabetes    per patient - does not check blood sugar   Right hip pain 05/05/2018   Rupture of anterior cruciate ligament of right knee 02/15/2018   Sprain of medial collateral ligament of right knee 02/15/2018   SVD (spontaneous vaginal delivery)    x 5 - only 2 living, 1 stillborn and 1 premature at 7 months demise,1 child deceased   Wears dentures    upper and lower    Objective:  General: Well developed, nourished, in no acute distress, alert and oriented x3   Dermatology: Skin is warm, dry and supple bilateral.  Medial border right great toe is tender with evidence of an ingrowing nail. Pain on palpation noted to the border of the nail fold. The remaining nails appear unremarkable at this time. There are no open sores, lesions.  Vascular: DP and PT pulses palpable.  No clinical evidence of vascular compromise  Neruologic: Grossly intact via light touch bilateral.  Musculoskeletal: No pedal deformity noted  Assesement: #1 Paronychia with ingrowing nail medial border right great  Plan of Care:  1. Patient evaluated.  2. Discussed treatment alternatives and plan of care. Explained nail avulsion procedure and post procedure course to patient. 3. Patient opted for permanent partial nail avulsion of the ingrown portion of the nail.  4. Prior to procedure, local anesthesia infiltration  utilized using 3 ml of a 50:50 mixture of 2% plain lidocaine and 0.5% plain marcaine in a normal hallux block fashion and a betadine prep performed.  5. Partial permanent nail avulsion with chemical matrixectomy performed using 3x30sec applications of phenol followed by alcohol flush.  6. Light dressing applied.  Post care instructions provided 7.  Return to clinic 2 weeks.  Felecia Shelling, DPM [facility]  Dr. Felecia Shelling, DPM    [address] [place], [place] [address]                Office [phone]  Fax [phone]

## 2022-11-19 NOTE — Progress Notes (Signed)
Office Visit Note   Patient: Sandra Lozano           Date of Birth: January 03, 1961           MRN: [id number] Visit Date: 11/19/2022              Requested by: Lockie Mola, MD [address] [place],  [place] [address] PCP: Lockie Mola, MD  Chief Complaint  Patient presents with   Right Knee - Pain      HPI: The patient is a 61 year old woman who comes in today complaining of anterior medial knee pain this is worse with weightbearing she recently had a epidural steroid injection which she feels has greatly improved the pain that she was having posterior thigh buttocks and hip.  She is now limping  Requesting Depo-Medrol injection today has had relief with these in the past for knee pain. She is interested in discussing supplemental injection of the right knee  Assessment & Plan: Visit Diagnoses: No diagnosis found.  Plan: Depo-Medrol injection right knee.  Patient tolerated well.  We will request authorization for supplemental injection right knee  Follow-Up Instructions: No follow-ups on file.   Right Knee Exam   Tenderness  The patient is experiencing tenderness in the medial joint line and patella.  Range of Motion  The patient has normal right knee ROM.  Tests  Varus: negative Valgus: negative  Other  Swelling: none Effusion: no effusion present      Patient is alert, oriented, no adenopathy, well-dressed, normal affect, normal respiratory effort.   Imaging: No results found. No images are attached to the encounter.  Labs: Lab Results  Component Value Date   HGBA1C 6.4 08/19/2022   HGBA1C 6.1 08/22/2020   HGBA1C 6.3 11/01/2019   ESRSEDRATE 11 11/26/2021   LABURIC 5.2 11/26/2021     Lab Results  Component Value Date   ALBUMIN 4.0 05/18/2019    No results found for: "MG" No results found for: "VD25OH"  No results found for: "PREALBUMIN"    Latest Ref Rng & Units 11/02/2020    8:05 AM 05/21/2019    2:10 AM 05/18/2019   10:02 AM  CBC  EXTENDED  WBC 4.0 - 10.5 K/uL  11.4  10.5   RBC 3.87 - 5.11 MIL/uL  3.35  4.78   Hemoglobin 12.0 - 15.0 g/dL 54.0  9.3  08.6   HCT 76.1 - 46.0 % 43.0  28.1  41.2   Platelets 150 - 400 K/uL  284  356      There is no height or weight on file to calculate BMI.  Orders:  No orders of the defined types were placed in this encounter.  No orders of the defined types were placed in this encounter.    Procedures: Large Joint Inj: R knee on 11/19/2022 4:04 PM Indications: pain Details: 18 G 1.5 in needle, anteromedial approach Medications: 5 mL lidocaine 1 %; 40 mg methylPREDNISolone acetate 40 MG/ML Consent was given by the patient.      Clinical Data: No additional findings.  ROS:  All other systems negative, except as noted in the HPI. Review of Systems  Constitutional: Negative.   Musculoskeletal:  Positive for arthralgias. Negative for joint swelling.    Objective: Vital Signs: LMP  (LMP Unknown)   Specialty Comments:  EXAM: MRI LUMBAR SPINE WITHOUT CONTRAST   TECHNIQUE: Multiplanar, multisequence MR imaging of the lumbar spine was performed. No intravenous contrast was administered.   COMPARISON:  Lumbar radiographs 09/28/2018  FINDINGS: Segmentation:  Normal   Alignment:  7 mm anterolisthesis L4-5.  Remaining alignment normal   Vertebrae: Negative for fracture or mass. Bone marrow edema at L4-5 related to discogenic change.   Conus medullaris and cauda equina: Conus extends to the L1-2 level. Conus and cauda equina appear normal.   Paraspinal and other soft tissues: Negative for paraspinous mass or fluid collection   Disc levels:   L1-2: Negative   L2-3: Negative   L3-4: Mild facet degeneration. Negative for disc protrusion or stenosis   L4-5: 7 mm anterolisthesis. Severe facet degeneration bilaterally. Severe disc degeneration with endplate erosive change and edema. Severe spinal stenosis. Severe subarticular foraminal stenosis left greater  than right   L5-S1: Negative   IMPRESSION: Severe spinal stenosis L4-5 with severe subarticular foraminal stenosis bilaterally left greater than right. Grade 1 anterolisthesis L4-5. Remaining lumbar spine negative     Electronically Signed   By: Marlan Palau M.D.   On: 11/13/2018 15:34  PMFS History: Patient Active Problem List   Diagnosis Date Noted   Left shoulder pain 08/25/2022   DJD (degenerative joint disease), multiple sites 08/06/2022   COPD, mild ([facility]) 02/11/2022   Carpal tunnel syndrome, bilateral 12/21/2021   Arthritis of finger of left hand 11/26/2021   Hot flashes 04/20/2021   Traumatic arthritis of left foot    Skin lesion of scalp 06/04/2020   Arthritis of midfoot 03/14/2020   History of lumbar fusion 03/14/2020   Neuropathy 11/07/2019   Well controlled diabetes mellitus ([facility]) 11/07/2019   Nummular eczema 07/06/2019   Lumbar stenosis 05/20/2019   Acute left-sided low back pain without sciatica 07/18/2018   Osteoarthritis of right hip 06/03/2018   Chronic right shoulder pain 05/05/2018   Essential hypertension 02/15/2018   Past Medical History:  Diagnosis Date   Arthritis    right knee, lower back   Chest wall pain 04/20/2021   Dyspnea    very rare -tx with albuterol neb sol if needed   Hyperlipidemia    Hypertension    Intertrigo 08/06/2022   Pre-diabetes    per patient - does not check blood sugar   Right hip pain 05/05/2018   Rupture of anterior cruciate ligament of right knee 02/15/2018   Sprain of medial collateral ligament of right knee 02/15/2018   SVD (spontaneous vaginal delivery)    x 5 - only 2 living, 1 stillborn and 1 premature at 7 months demise,1 child deceased   Wears dentures    upper and lower    Family History  Problem Relation Age of Onset   Cancer Sister    Healthy Daughter    Healthy Daughter     Past Surgical History:  Procedure Laterality Date   FOOT ARTHRODESIS Left 11/02/2020   Procedure: LEFT MIDFOOT FUSION BASE 1ST  AND 2ND METATARSAL;  Surgeon: Nadara Mustard, MD;  Location: [facility] OR;  Service: Orthopedics;  Laterality: Left;   LUMBAR FUSION  05/20/2019   GILL PROCEDURE, LEFT TRANSFORAMINAL LUMBAR INTERBODY FUSION, PEDICLE INSTRUMENTATION, BILATERAL FUSION (N/A   TUBAL LIGATION     Social History   Occupational History   Not on file  Tobacco Use   Smoking status: Former    Packs/day: 1.00    Years: 30.00    Total pack years: 30.00    Types: Cigarettes    Quit date: 01/2015    Years since quitting: 7.8    Passive exposure: Past   Smokeless tobacco: Never  Vaping Use   Vaping Use:  Never used  Substance and Sexual Activity   Alcohol use: Not Currently   Drug use: Not Currently    Types: Marijuana    Comment: stopped 12/15/2021   Sexual activity: Not on file

## 2022-11-19 NOTE — Telephone Encounter (Signed)
VOB submitted for Monovisc, right knee.  Will inform patient if gel injection will be covered by medicaid or if there is another option for patient to try.

## 2022-11-25 NOTE — Procedures (Signed)
Lumbosacral Transforaminal Epidural Steroid Injection - Sub-Pedicular Approach with Fluoroscopic Guidance  Patient: Sandra Lozano      Date of Birth: January 01, 1961 MRN: [id number] PCP: Lockie Mola, MD      Visit Date: 11/12/2022   Universal Protocol:    Date/Time: 11/12/2022  Consent Given By: the patient  Position: PRONE  Additional Comments: Vital signs were monitored before and after the procedure. Patient was prepped and draped in the usual sterile fashion. The correct patient, procedure, and site was verified.   Injection Procedure Details:   Procedure diagnoses: Lumbar radiculopathy [M54.16]    Meds Administered:  Meds ordered this encounter  Medications   methylPREDNISolone acetate (DEPO-MEDROL) injection 80 mg    Laterality: Right  Location/Site: L5  Needle:5.0 in., 22 ga.  Short bevel or Quincke spinal needle  Needle Placement: Transforaminal  Findings:    -Comments: Excellent flow of contrast along the nerve, nerve root and into the epidural space.  Procedure Details: After squaring off the end-plates to get a true AP view, the C-arm was positioned so that an oblique view of the foramen as noted above was visualized. The target area is just inferior to the "nose of the scotty dog" or sub pedicular. The soft tissues overlying this structure were infiltrated with 2-3 ml. of 1% Lidocaine without Epinephrine.  The spinal needle was inserted toward the target using a "trajectory" view along the fluoroscope beam.  Under AP and lateral visualization, the needle was advanced so it did not puncture dura and was located close the 6 O'Clock position of the pedical in AP tracterory. Biplanar projections were used to confirm position. Aspiration was confirmed to be negative for CSF and/or blood. A 1-2 ml. volume of Isovue-250 was injected and flow of contrast was noted at each level. Radiographs were obtained for documentation purposes.   After attaining the desired flow of  contrast documented above, a 0.5 to 1.0 ml test dose of 0.25% Marcaine was injected into each respective transforaminal space.  The patient was observed for 90 seconds post injection.  After no sensory deficits were reported, and normal lower extremity motor function was noted,   the above injectate was administered so that equal amounts of the injectate were placed at each foramen (level) into the transforaminal epidural space.   Additional Comments:  No complications occurred Dressing: 2 x 2 sterile gauze and Band-Aid    Post-procedure details: Patient was observed during the procedure. Post-procedure instructions were reviewed.  Patient left the clinic in stable condition.

## 2022-11-25 NOTE — Progress Notes (Signed)
Sandra Lozano - 61 y.o. female MRN [id number]  Date of birth: 1961/05/08  Office Visit Note: Visit Date: 11/12/2022 PCP: Lockie Mola, MD Referred by: Juanda Chance, NP  Subjective: Chief Complaint  Patient presents with   Lower Back - Pain   HPI:  Sandra Lozano is a 61 y.o. female who comes in today at the request of Ellin Goodie, FNP for planned Right L5-S1 Lumbar Transforaminal epidural steroid injection with fluoroscopic guidance.  The patient has failed conservative care including home exercise, medications, time and activity modification.  This injection will be diagnostic and hopefully therapeutic.  Please see requesting physician notes for further details and justification.   ROS Otherwise per HPI.  Assessment & Plan: Visit Diagnoses:    ICD-10-CM   1. Lumbar radiculopathy  M54.16 XR C-ARM NO REPORT    Epidural Steroid injection    methylPREDNISolone acetate (DEPO-MEDROL) injection 80 mg      Plan: No additional findings.   Meds & Orders:  Meds ordered this encounter  Medications   methylPREDNISolone acetate (DEPO-MEDROL) injection 80 mg    Orders Placed This Encounter  Procedures   XR C-ARM NO REPORT   Epidural Steroid injection    Follow-up: Return for visit to requesting provider as needed.   Procedures: No procedures performed  Lumbosacral Transforaminal Epidural Steroid Injection - Sub-Pedicular Approach with Fluoroscopic Guidance  Patient: Sandra Lozano      Date of Birth: [birth year] MRN: [id number] PCP: Lockie Mola, MD      Visit Date: 11/12/2022   Universal Protocol:    Date/Time: 11/12/2022  Consent Given By: the patient  Position: PRONE  Additional Comments: Vital signs were monitored before and after the procedure. Patient was prepped and draped in the usual sterile fashion. The correct patient, procedure, and site was verified.   Injection Procedure Details:   Procedure diagnoses: Lumbar radiculopathy [M54.16]    Meds  Administered:  Meds ordered this encounter  Medications   methylPREDNISolone acetate (DEPO-MEDROL) injection 80 mg    Laterality: Right  Location/Site: L5  Needle:5.0 in., 22 ga.  Short bevel or Quincke spinal needle  Needle Placement: Transforaminal  Findings:    -Comments: Excellent flow of contrast along the nerve, nerve root and into the epidural space.  Procedure Details: After squaring off the end-plates to get a true AP view, the C-arm was positioned so that an oblique view of the foramen as noted above was visualized. The target area is just inferior to the "nose of the scotty dog" or sub pedicular. The soft tissues overlying this structure were infiltrated with 2-3 ml. of 1% Lidocaine without Epinephrine.  The spinal needle was inserted toward the target using a "trajectory" view along the fluoroscope beam.  Under AP and lateral visualization, the needle was advanced so it did not puncture dura and was located close the 6 O'Clock position of the pedical in AP tracterory. Biplanar projections were used to confirm position. Aspiration was confirmed to be negative for CSF and/or blood. A 1-2 ml. volume of Isovue-250 was injected and flow of contrast was noted at each level. Radiographs were obtained for documentation purposes.   After attaining the desired flow of contrast documented above, a 0.5 to 1.0 ml test dose of 0.25% Marcaine was injected into each respective transforaminal space.  The patient was observed for 90 seconds post injection.  After no sensory deficits were reported, and normal lower extremity motor function was noted,   the above injectate was administered so that equal  amounts of the injectate were placed at each foramen (level) into the transforaminal epidural space.   Additional Comments:  No complications occurred Dressing: 2 x 2 sterile gauze and Band-Aid    Post-procedure details: Patient was observed during the procedure. Post-procedure instructions  were reviewed.  Patient left the clinic in stable condition.    Clinical History: EXAM: MRI LUMBAR SPINE WITHOUT CONTRAST   TECHNIQUE: Multiplanar, multisequence MR imaging of the lumbar spine was performed. No intravenous contrast was administered.   COMPARISON:  Lumbar radiographs 09/28/2018   FINDINGS: Segmentation:  Normal   Alignment:  7 mm anterolisthesis L4-5.  Remaining alignment normal   Vertebrae: Negative for fracture or mass. Bone marrow edema at L4-5 related to discogenic change.   Conus medullaris and cauda equina: Conus extends to the L1-2 level. Conus and cauda equina appear normal.   Paraspinal and other soft tissues: Negative for paraspinous mass or fluid collection   Disc levels:   L1-2: Negative   L2-3: Negative   L3-4: Mild facet degeneration. Negative for disc protrusion or stenosis   L4-5: 7 mm anterolisthesis. Severe facet degeneration bilaterally. Severe disc degeneration with endplate erosive change and edema. Severe spinal stenosis. Severe subarticular foraminal stenosis left greater than right   L5-S1: Negative   IMPRESSION: Severe spinal stenosis L4-5 with severe subarticular foraminal stenosis bilaterally left greater than right. Grade 1 anterolisthesis L4-5. Remaining lumbar spine negative     Electronically Signed   By: Marlan Palau M.D.   On: 11/13/2018 15:34     Objective:  VS:  HT:    WT:   BMI:     BP:114/66  HR:62bpm  TEMP: ( )  RESP:  Physical Exam Vitals and nursing note reviewed.  Constitutional:      General: She is not in acute distress.    Appearance: Normal appearance. She is not ill-appearing.  HENT:     Head: Normocephalic and atraumatic.     Right Ear: External ear normal.     Left Ear: External ear normal.  Eyes:     Extraocular Movements: Extraocular movements intact.  Cardiovascular:     Rate and Rhythm: Normal rate.     Pulses: Normal pulses.  Pulmonary:     Effort: Pulmonary effort is  normal. No respiratory distress.  Abdominal:     General: There is no distension.     Palpations: Abdomen is soft.  Musculoskeletal:        General: Tenderness present.     Cervical back: Neck supple.     Right lower leg: No edema.     Left lower leg: No edema.     Comments: Patient has good distal strength with no pain over the greater trochanters.  No clonus or focal weakness.  Skin:    Findings: No erythema, lesion or rash.  Neurological:     General: No focal deficit present.     Mental Status: She is alert and oriented to person, place, and time.     Sensory: No sensory deficit.     Motor: No weakness or abnormal muscle tone.     Coordination: Coordination normal.  Psychiatric:        Mood and Affect: Mood normal.        Behavior: Behavior normal.      Imaging: No results found.

## 2022-11-26 NOTE — Telephone Encounter (Signed)
Did this get approved yet

## 2022-11-28 ENCOUNTER — Telehealth: Payer: Self-pay | Admitting: Physical Medicine and Rehabilitation

## 2022-11-28 NOTE — Telephone Encounter (Signed)
Spoke with patient and she stated she is feeling much better and she did not need to come to the appointment. Appointment on 12/03/22 cancelled

## 2022-11-28 NOTE — Telephone Encounter (Signed)
Pt called to cancel appt. Please call pt at [phone].

## 2022-12-01 ENCOUNTER — Other Ambulatory Visit: Payer: Self-pay

## 2022-12-01 ENCOUNTER — Other Ambulatory Visit: Payer: Self-pay | Admitting: Family Medicine

## 2022-12-01 DIAGNOSIS — G5601 Carpal tunnel syndrome, right upper limb: Secondary | ICD-10-CM

## 2022-12-01 DIAGNOSIS — E119 Type 2 diabetes mellitus without complications: Secondary | ICD-10-CM

## 2022-12-01 DIAGNOSIS — M25512 Pain in left shoulder: Secondary | ICD-10-CM

## 2022-12-01 DIAGNOSIS — R7303 Prediabetes: Secondary | ICD-10-CM

## 2022-12-01 MED ORDER — ACCU-CHEK SOFTCLIX LANCETS MISC: 12 refills | Status: DC

## 2022-12-01 MED ORDER — SEMAGLUTIDE (2 MG/DOSE) 8 MG/3ML ~~LOC~~ SOPN: 2.0000 mg | PEN_INJECTOR | SUBCUTANEOUS | 0 refills | Status: DC

## 2022-12-01 MED ORDER — GABAPENTIN 600 MG PO TABS: 600.0000 mg | ORAL_TABLET | Freq: Every day | ORAL | 2 refills | Status: DC

## 2022-12-01 MED ORDER — ACCU-CHEK AVIVA PLUS VI STRP: ORAL_STRIP | 12 refills | Status: DC

## 2022-12-01 MED ORDER — GLUCOSE BLOOD VI STRP: ORAL_STRIP | 6 refills | Status: DC

## 2022-12-02 ENCOUNTER — Ambulatory Visit
Admission: RE | Admit: 2022-12-02 | Discharge: 2022-12-02 | Disposition: A | Discharge status: Home / Self Care | Payer: Medicaid Other | Source: Ambulatory Visit | Attending: Family Medicine | Admitting: Family Medicine

## 2022-12-02 DIAGNOSIS — Z1231 Encounter for screening mammogram for malignant neoplasm of breast: Secondary | ICD-10-CM

## 2022-12-03 ENCOUNTER — Encounter: Payer: Self-pay | Admitting: *Deleted

## 2022-12-03 ENCOUNTER — Other Ambulatory Visit: Payer: Medicaid Other | Admitting: *Deleted

## 2022-12-03 ENCOUNTER — Ambulatory Visit: Payer: Medicaid Other | Admitting: Physical Medicine and Rehabilitation

## 2022-12-03 NOTE — Patient Outreach (Signed)
Medicaid Managed Care   Nurse Care Manager Note  12/03/2022 Name:  Sandra Lozano MRN:  [id number] DOB:  Jul 26, 1961  Sandra Lozano is an 61 y.o. year old female who is a primary patient of [name], MD.  The [facility] Managed Care Coordination team was consulted for assistance with:    HTN Pain  Sandra Lozano was given information about Medicaid Managed Care Coordination team services today. Sandra Lozano Patient agreed to services and verbal consent obtained.  Engaged with patient by telephone for follow up visit in response to provider referral for case management and/or care coordination services.   Assessments/Interventions:  Review of past medical history, allergies, medications, health status, including review of consultants reports, laboratory and other test data, was performed as part of comprehensive evaluation and provision of chronic care management services.  SDOH (Social Determinants of Health) assessments and interventions performed: SDOH Interventions    Flowsheet Row Patient Outreach Telephone from 12/03/2022 in [name] Patient Outreach Telephone from 11/03/2022 in [name] Patient Outreach Telephone from 09/24/2022 in [name] Coord[place] Patient Outreach Telephone from 08/05/2021 in [name] Interventions      Food Insecurity Interventions -- Other (Comment)  [Referral to BSW] -- --  Housing Interventions -- -- Intervention Not Indicated --  Transportation Interventions -- -- Intervention Not Indicated Intervention Not Indicated  Utilities Interventions Intervention Not Indicated -- -- --  Physical Activity Interventions -- -- -- Intervention Not Indicated  Social Connections Interventions -- -- -- Intervention Not Indicated       Care Plan  No Known Allergies  Medications Reviewed Today     Reviewed by Sandra Montane, RN  (Registered Nurse) on 12/03/22 at [phone]  Med List Status: <None>   Medication Order Taking? Sig Documenting Provider Last Dose Status Informant  Accu-Chek Softclix Lancets lancets [id number] Yes Use to check blood sugar once a day [name], MD Taking Active   Acetaminophen (TYLENOL 8 HOUR ARTHRITIS PAIN PO) [id number] Yes Take by mouth. [provider] Taking Active   aspirin EC 325 MG tablet [id number] Yes Take 650 mg by mouth daily. Takes every morning [provider] Taking Active Self  atorvastatin (LIPITOR) 20 MG tablet [id number] Yes Take 1 tablet (20 mg total) by mouth every evening. [name], MD Taking Active   betamethasone acetate-betamethasone sodium phosphate (CELESTONE) injection 3 mg [id number]   Edrick Kins, DPM  Active   Blood Glucose Monitoring Suppl w/Device KIT [id number] Yes 1 kit by Does not apply route daily. [name], DO Taking Active   Blood Pressure Monitoring (BLOOD PRESSURE CUFF) MISC [id number] Yes Check your blood pressure in the morning when you wake up, and at night before you go to bed Autry-Lott, Naaman Plummer, DO Taking Active   cetirizine (ZYRTEC) 10 MG tablet [id number] No Take 1 tablet (10 mg total) by mouth daily.  Patient not taking: Reported on 12/03/2022   [name], DO Not Taking Active   diazepam (VALIUM) 5 MG tablet [id number] No Take one tablet by mouth with food one hour prior to procedure. May repeat 30 minutes prior if needed.  Patient not taking: Reported on 12/03/2022   Lorine Bears, NP Not Taking Active            Med Note ([name]   Wed Dec 03, 2022  9:02 AM) completed  diclofenac Sodium (VOLTAREN) 1 % GEL [id number] Yes APPLY 4 GRAMS  TOPICALLY 4 (FOUR) TIMES DAILY. [name], MD Taking Active   fluticasone [facility]) 50 MCG/ACT nasal spray [id number] No Place 2 sprays into both nostrils daily.  Patient not taking: Reported on 12/03/2022   [name], DO Not Taking Active   gabapentin  (NEURONTIN) 600 MG tablet [id number] Yes Take 1 tablet (600 mg total) by mouth at bedtime. [name], MD Taking Active   glucose blood (ACCU-CHEK AVIVA PLUS) test strip [id number] Yes Use as instructed [name], MD Taking Active   glucose blood test strip [id number] Yes Please use to check blood sugar once daily. [name], MD Taking Active   ketoconazole (NIZORAL) 2 % shampoo [id number] Yes Apply 1 Application topically 2 (two) times a week. [name], MD Taking Active   lisinopril-hydrochlorothiazide (ZESTORETIC) 20-12.5 MG tablet [id number] Yes Take 2 tablets by mouth daily. [name], MD Taking Active   meloxicam (MOBIC) 15 MG tablet [id number] Yes TAKE 1 TABLET (15 MG TOTAL) BY MOUTH DAILY. [name], MD Taking Active   metFORMIN (GLUCOPHAGE) 500 MG tablet [id number] Yes TAKE 1 TABLET (500 MG TOTAL) BY MOUTH DAILY WITH BREAKFAST. [name], MD Taking Active   methylPREDNISolone acetate (DEPO-MEDROL) injection 40 mg [id number]   Rodena Goldmann A, DO  Active   Omega-3 Fatty Acids (FISH OIL) 1000 MG CAPS [id number] Yes Take 1 capsule (1,000 mg total) by mouth at bedtime. Autry-Lott, Naaman Plummer, DO Taking Active   Semaglutide, 2 MG/DOSE, 8 MG/3ML SOPN [id number] Yes Inject 2 mg into the skin once a week. [name], MD Taking Active   terbinafine (LAMISIL) 1 % cream [id number] Yes Apply 1 Application topically 2 (two) times daily. Lind Covert, MD Taking Active   triamcinolone ointment (KENALOG) 0.1 % [id number] Yes Apply 1 Application topically 2 (two) times daily. Apply to scalp. Don't use more than 2 weeks in a row without physician approval. [name], MD Taking Active             Patient Active Problem List   Diagnosis Date Noted   Left shoulder pain 08/25/2022   DJD (degenerative joint disease), multiple sites 08/06/2022   COPD, mild ([place]) 02/11/2022   Carpal tunnel syndrome, bilateral 12/21/2021   Arthritis of finger of left hand 11/26/2021    Hot flashes 04/20/2021   Traumatic arthritis of left foot    Skin lesion of scalp 06/04/2020   Arthritis of midfoot 03/14/2020   History of lumbar fusion 03/14/2020   Neuropathy 11/07/2019   Well controlled diabetes mellitus ([place]) 11/07/2019   Nummular eczema 07/06/2019   Lumbar stenosis 05/20/2019   Acute left-sided low back pain without sciatica 07/18/2018   Osteoarthritis of right hip 06/03/2018   Chronic right shoulder pain 05/05/2018   Essential hypertension 02/15/2018    Conditions to be addressed/monitored per PCP order:  HTN and Pain  Care Plan : [place]  Updates made by Sandra Montane, RN since 12/03/2022 12:00 AM     Problem: Chronic Disease Management and Care Coordination Needs realted to HTN, prediabetes, and chronic pain   Priority: High     Long-Range Goal: Development of Plan Of Care For Chronic Disease Management and Care Coordination Needs To Assist With Meeting Treatment Goals for HTN, prediabetes and chronic pain and HLD   Start Date: 08/05/2021  Expected End Date: 02/12/2023  Priority: High  Note:   Current Barriers:  Chronic Disease Management support and education needs related to HTN, prediabetes and chronic pain, HLD RNCM Clinical Goal(s):  Patient will verbalize understanding of plan for management of HTN , prediabetes and chronic pain through collaboration with RN Care manager, provider, and care team.   Interventions: Inter-disciplinary care team collaboration (see longitudinal plan of care) Evaluation of current treatment plan related to  self management and patient's adherence to plan as established by provider BSW referral for food insecurity   Hypertension: (Status: Goal on Track (progressing): YES.)Long Term- she attributes higher readings at provider's office due to pain and nervousness Last practice recorded BP readings:  BP Readings from Last 3 Encounters:  11/19/22 (!) 162/82  11/12/22 114/66  10/09/22 127/80         Most recent eGFR/CrCl:  Lab Results  Component Value Date   EGFR 98 10/09/2022    No components found for: "CRCL"  Evaluation of current treatment plan related to hypertension self management and patient's adherence to plan as established by provider;   Reviewed medications with patient and discussed importance of compliance;  Advised patient to take her BP monitor to her next office appointment to check accuracy Assessed BP readings in provider's office via EMR review Discussed plans with patient for ongoing care management follow up and provided patient with direct contact information for care management team; Advised patient to schedule a follow up visit with PCP   Pain Interventions:  (Status:  Goal on track:  Yes.) Long Term Goal-pain 5/10 Medications reviewed Reviewed provider established plan for pain management Discussed importance of adherence to all scheduled medical appointments Counseled on the importance of reporting any/all new or changed pain symptoms or management strategies to pain management provider Advised patient to report to care team affect of pain on daily activities Assessed social determinant of health barriers Advised patient to contact [place] for enhanced benefits, such as, gym membership, $10 monthly items and $250 to help with utilities and rent-revisited Reviewed provider notes, discussed recent back injection   Patient Goals/Self-Care Activities: Patient will self administer medications as prescribed Patient will attend all scheduled provider appointments Patient will call pharmacy for medication refills Patient will continue to perform ADL's independently Patient will continue to perform IADL's independently Patient will call provider office for new concerns or questions        Follow Up:  Patient agrees to Care Plan and Follow-up.  Plan: The Managed Medicaid care management team will reach out to the patient again over the  next 60 days.  Date/time of next scheduled RN care management/care coordination outreach:  02/04/23 @ [place] RN, BSN [employer] manager

## 2022-12-03 NOTE — Patient Instructions (Signed)
Visit Information  Ms. Cannella was given information about Medicaid Managed Care team care coordination services as a part of their [facility] Medicaid benefit. Morena Mckissack verbally consented to engagement with the [facility] Managed Care team.   If you are experiencing a medical emergency, please call 911 or report to your local emergency department or urgent care.   If you have a non-emergency medical problem during routine business hours, please contact your provider's office and ask to speak with a nurse.   For questions related to your [facility] health plan, please call: [phone] or go here:[email or web address]  If you would like to schedule transportation through your [facility] plan, please call the following number at least 2 days in advance of your appointment: [phone].  You can also use the MTM portal or MTM mobile app to manage your rides. For the portal, please go to [email or web address].  Call the [place] at [phone], at any time, 24 hours a day, 7 days a week. If you are in danger or need immediate medical attention call 911.  If you would like help to quit smoking, call 1-800-QUIT-NOW 313 272 6875) OR Espaol: 1-855-Djelo-Ya (9-735-329-9242) o para ms informacin haga clic aqu or Text READY to 200-400 to register via text  Ms. [place],   Please see education materials related to managing arthritis provided by MyChart link.  Patient verbalizes understanding of instructions and care plan provided today and agrees to view in [place]. Active MyChart status and patient understanding of how to access instructions and care plan via MyChart confirmed with patient.     Telephone follow up appointment with Managed Medicaid care management team member scheduled for:02/04/23 @ [place] RN, [place] RN Care Coordinator   Following is a copy of your plan of care:  Care Plan : [place]  Updates made by Melissa Montane, RN since 12/03/2022 12:00 AM     Problem: Chronic Disease Management and Care Coordination Needs realted to HTN, prediabetes, and chronic pain   Priority: High     Long-Range Goal: Development of Plan Of Care For Chronic Disease Management and Care Coordination Needs To Assist With Meeting Treatment Goals for HTN, prediabetes and chronic pain and HLD   Start Date: 08/05/2021  Expected End Date: 02/12/2023  Priority: High  Note:   Current Barriers:  Chronic Disease Management support and education needs related to HTN, prediabetes and chronic pain, HLD RNCM Clinical Goal(s):  Patient will verbalize understanding of plan for management of HTN , prediabetes and chronic pain through collaboration with RN Care manager, provider, and care team.   Interventions: Inter-disciplinary care team collaboration (see longitudinal plan of care) Evaluation of current treatment plan related to  self management and patient's adherence to plan as established by provider BSW referral for food insecurity   Hypertension: (Status: Goal on Track (progressing): YES.)Long Term- she attributes higher readings at provider's office due to pain and nervousness Last practice recorded BP readings:  BP Readings from Last 3 Encounters:  11/19/22 (!) 162/82  11/12/22 114/66  10/09/22 127/80        Most recent eGFR/CrCl:  Lab Results  Component Value Date   EGFR 98 10/09/2022    No components found for: "CRCL"  Evaluation of current treatment plan related to hypertension self management and patient's adherence to plan as established by provider;   Reviewed medications with patient and discussed importance of compliance;  Advised patient to  take her BP monitor to her next office appointment to check accuracy Assessed BP readings in provider's office via EMR review Discussed plans with patient for ongoing care management follow up and provided patient with  direct contact information for care management team; Advised patient to schedule a follow up visit with PCP   Pain Interventions:  (Status:  Goal on track:  Yes.) Long Term Goal-pain 5/10 Medications reviewed Reviewed provider established plan for pain management Discussed importance of adherence to all scheduled medical appointments Counseled on the importance of reporting any/all new or changed pain symptoms or management strategies to pain management provider Advised patient to report to care team affect of pain on daily activities Assessed social determinant of health barriers Advised patient to contact [place] for enhanced benefits, such as, gym membership, $10 monthly items and $250 to help with utilities and rent-revisited Reviewed provider notes, discussed recent back injection   Patient Goals/Self-Care Activities: Patient will self administer medications as prescribed Patient will attend all scheduled provider appointments Patient will call pharmacy for medication refills Patient will continue to perform ADL's independently Patient will continue to perform IADL's independently Patient will call provider office for new concerns or questions

## 2022-12-04 ENCOUNTER — Other Ambulatory Visit: Payer: Medicaid Other

## 2022-12-04 NOTE — Patient Instructions (Signed)
Visit Information  Ms. Sandra Lozano was given information about Medicaid Managed Care team care coordination services as a part of their [facility] Medicaid benefit. Sandra Lozano verbally consented to engagement with the [facility] Managed Care team.   If you are experiencing a medical emergency, please call 911 or report to your local emergency department or urgent care.   If you have a non-emergency medical problem during routine business hours, please contact your provider's office and ask to speak with a nurse.   For questions related to your [facility] health plan, please call: [phone] or go here:[email or web address]  If you would like to schedule transportation through your [facility] plan, please call the following number at least 2 days in advance of your appointment: [phone].  You can also use the MTM portal or MTM mobile app to manage your rides. For the portal, please go to [email or web address]  Call the [facility] Crisis Line at [phone], at any time, 24 hours a day, 7 days a week. If you are in danger or need immediate medical attention call 911.  If you would like help to quit smoking, call 1-800-QUIT-NOW (671-232-4774) OR Espaol: 1-855-Djelo-Ya (5-331-740-9927) o para ms informacin haga clic aqu or Text READY to 800-447 to register via text  Ms. Sandra Lozano - following are the goals we discussed in your visit today:   Goals Addressed   None      Social Worker will follow up on 01/05/23.   Sandra Lozano, BSW, [facility]  [place]  High Risk Managed Medicaid Team  [phone]   Following is a copy of your plan of care:  There are no care plans that you recently modified to display for this patient.

## 2022-12-04 NOTE — Patient Outreach (Signed)
Medicaid Managed Care Social Work Note  12/04/2022 Name:  Sandra Lozano MRN:  [id number] DOB:  [birth year]  Sandra Lozano is an 61 y.o. year old female who is a primary patient of [name], MD.  The Medicaid Managed Care Coordination team was consulted for assistance with:  Community Resources   Sandra Lozano was given information about Medicaid Managed Care Coordination team services today. [name] Patient agreed to services and verbal consent obtained.  Engaged with patient  for by telephone forfollow up visit in response to referral for case management and/or care coordination services.   Assessments/Interventions:  Review of past medical history, allergies, medications, health status, including review of consultants reports, laboratory and other test data, was performed as part of comprehensive evaluation and provision of chronic care management services.  SDOH: (Social Determinant of Health) assessments and interventions performed: SDOH Interventions    Flowsheet Row Patient Outreach Telephone from 12/03/2022 in [place] Patient Outreach Telephone from 11/03/2022 in [place] Patient Outreach Telephone from 09/24/2022 in [place] Coordination Patient Outreach Telephone from 08/05/2021 in [place] Coordination  SDOH Interventions      Food Insecurity Interventions -- Other (Comment)  [Referral to BSW] -- --  Housing Interventions -- -- Intervention Not Indicated --  Transportation Interventions -- -- Intervention Not Indicated Intervention Not Indicated  Utilities Interventions Intervention Not Indicated -- -- --  Physical Activity Interventions -- -- -- Intervention Not Indicated  Social Connections Interventions -- -- -- Intervention Not Indicated     BSW completed a telephone outreach with patient to follow up resources. Patient stated she did receive the  resources BSW emailed and she is doing fabulous. Patient states no resources are needed at this time.  Advanced Directives Status:  Not addressed in this encounter.  Care Plan                 No Known Allergies  Medications Reviewed Today     Reviewed by Melissa Montane, RN (Registered Nurse) on 12/03/22 at [id number]  Med List Status: <None>   Medication Order Taking? Sig Documenting Provider Last Dose Status Informant  Accu-Chek Softclix Lancets lancets [id number] Yes Use to check blood sugar once a day [name], MD Taking Active   Acetaminophen (TYLENOL 8 HOUR ARTHRITIS PAIN PO) [id number] Yes Take by mouth. [provider] Taking Active   aspirin EC 325 MG tablet [id number] Yes Take 650 mg by mouth daily. Takes every morning [provider] Taking Active Self  atorvastatin (LIPITOR) 20 MG tablet [id number] Yes Take 1 tablet (20 mg total) by mouth every evening. [name], MD Taking Active   betamethasone acetate-betamethasone sodium phosphate (CELESTONE) injection 3 mg [id number]   Edrick Kins, DPM  Active   Blood Glucose Monitoring Suppl w/Device KIT [id number] Yes 1 kit by Does not apply route daily. [name], DO Taking Active   Blood Pressure Monitoring (BLOOD PRESSURE CUFF) MISC [id number] Yes Check your blood pressure in the morning when you wake up, and at night before you go to bed Autry-Lott, Naaman Plummer, DO Taking Active   cetirizine (ZYRTEC) 10 MG tablet [id number] No Take 1 tablet (10 mg total) by mouth daily.  Patient not taking: Reported on 12/03/2022   [name], DO Not Taking Active   diazepam (VALIUM) 5 MG tablet [id number] No Take one tablet by mouth with food one hour prior to procedure. May repeat 30 minutes prior if needed.  Patient not taking: Reported on 12/03/2022   Lorine Bears, NP Not Taking Active            Med Note (ROBB, MELANIE A   Wed Dec 03, 2022  9:02 AM) completed  diclofenac Sodium (VOLTAREN) 1 % GEL [id number] Yes  APPLY 4 GRAMS TOPICALLY 4 (FOUR) TIMES DAILY. [name], MD Taking Active   fluticasone [facility]) 50 MCG/ACT nasal spray [id number] No Place 2 sprays into both nostrils daily.  Patient not taking: Reported on 12/03/2022   [name], DO Not Taking Active   gabapentin (NEURONTIN) 600 MG tablet [id number] Yes Take 1 tablet (600 mg total) by mouth at bedtime. [name], MD Taking Active   glucose blood (ACCU-CHEK AVIVA PLUS) test strip [id number] Yes Use as instructed [name], MD Taking Active   glucose blood test strip [id number] Yes Please use to check blood sugar once daily. [name], MD Taking Active   ketoconazole (NIZORAL) 2 % shampoo [id number] Yes Apply 1 Application topically 2 (two) times a week. [name], MD Taking Active   lisinopril-hydrochlorothiazide (ZESTORETIC) 20-12.5 MG tablet [id number] Yes Take 2 tablets by mouth daily. [name], MD Taking Active   meloxicam (MOBIC) 15 MG tablet [id number] Yes TAKE 1 TABLET (15 MG TOTAL) BY MOUTH DAILY. [name], MD Taking Active   metFORMIN (GLUCOPHAGE) 500 MG tablet [id number] Yes TAKE 1 TABLET (500 MG TOTAL) BY MOUTH DAILY WITH BREAKFAST. [name], MD Taking Active   methylPREDNISolone acetate (DEPO-MEDROL) injection 40 mg [id number]   Rodena Goldmann A, DO  Active   Omega-3 Fatty Acids (FISH OIL) 1000 MG CAPS [id number] Yes Take 1 capsule (1,000 mg total) by mouth at bedtime. Autry-Lott, Naaman Plummer, DO Taking Active   Semaglutide, 2 MG/DOSE, 8 MG/3ML SOPN [id number] Yes Inject 2 mg into the skin once a week. [name], MD Taking Active   terbinafine (LAMISIL) 1 % cream [id number] Yes Apply 1 Application topically 2 (two) times daily. Lind Covert, MD Taking Active   triamcinolone ointment (KENALOG) 0.1 % [id number] Yes Apply 1 Application topically 2 (two) times daily. Apply to scalp. Don't use more than 2 weeks in a row without physician approval. [name], MD Taking Active              Patient Active Problem List   Diagnosis Date Noted   Left shoulder pain 08/25/2022   DJD (degenerative joint disease), multiple sites 08/06/2022   COPD, mild ([place]) 02/11/2022   Carpal tunnel syndrome, bilateral 12/21/2021   Arthritis of finger of left hand 11/26/2021   Hot flashes 04/20/2021   Traumatic arthritis of left foot    Skin lesion of scalp 06/04/2020   Arthritis of midfoot 03/14/2020   History of lumbar fusion 03/14/2020   Neuropathy 11/07/2019   Well controlled diabetes mellitus ([place]) 11/07/2019   Nummular eczema 07/06/2019   Lumbar stenosis 05/20/2019   Acute left-sided low back pain without sciatica 07/18/2018   Osteoarthritis of right hip 06/03/2018   Chronic right shoulder pain 05/05/2018   Essential hypertension 02/15/2018    Conditions to be addressed/monitored per PCP order:   community resources  There are no care plans that you recently modified to display for this patient.   Follow up:  Patient agrees to Care Plan and Follow-up.  Plan: The Managed Medicaid care management team will reach out to the patient again over the next 30 days.  Date/time of next scheduled Social Work care management/care coordination outreach:  01/05/23  Mickel Fuchs, [place],  [place] Managed Medicaid Team  [phone]

## 2022-12-10 ENCOUNTER — Ambulatory Visit: Payer: Medicaid Other | Admitting: Podiatry

## 2022-12-10 DIAGNOSIS — L600 Ingrowing nail: Secondary | ICD-10-CM

## 2022-12-10 NOTE — Telephone Encounter (Signed)
Not approved yet
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
## 2022-12-18 ENCOUNTER — Encounter: Payer: Self-pay | Admitting: Family Medicine

## 2022-12-18 ENCOUNTER — Ambulatory Visit (INDEPENDENT_AMBULATORY_CARE_PROVIDER_SITE_OTHER): Payer: Medicaid Other | Admitting: Family Medicine

## 2022-12-18 ENCOUNTER — Other Ambulatory Visit: Payer: Self-pay

## 2022-12-18 VITALS — BP 156/71 | HR 95 | Wt 207.0 lb

## 2022-12-18 DIAGNOSIS — Z13220 Encounter for screening for lipoid disorders: Secondary | ICD-10-CM

## 2022-12-18 DIAGNOSIS — I10 Essential (primary) hypertension: Secondary | ICD-10-CM

## 2022-12-18 DIAGNOSIS — B359 Dermatophytosis, unspecified: Secondary | ICD-10-CM

## 2022-12-18 DIAGNOSIS — G5601 Carpal tunnel syndrome, right upper limb: Secondary | ICD-10-CM

## 2022-12-18 DIAGNOSIS — E119 Type 2 diabetes mellitus without complications: Secondary | ICD-10-CM

## 2022-12-18 LAB — POCT GLYCOSYLATED HEMOGLOBIN (HGB A1C): HbA1c, POC (controlled diabetic range): 5.7 % (ref 0.0–7.0)

## 2022-12-18 MED ORDER — KETOCONAZOLE 2 % EX GEL: 1.0000 | Freq: Every day | CUTANEOUS | 0 refills | Status: DC

## 2022-12-18 NOTE — Assessment & Plan Note (Signed)
Pt has history of white coat hypertension. Has been previously controlled on Zestoretic. Hypertensive today with elevated systolic 654/65. No BLE edema. Patient would like to log BP and follow up.  - BMP  - Blood pressure log  - Continue lisinopril 20 - HCTZ 12.5
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
## 2022-12-18 NOTE — Patient Instructions (Addendum)
It was great to see you today! Thank you for choosing [facility] for your primary care. Sandra Lozano was seen for annual physical.  Today we addressed: Rash - please apply ketaconazole cream daily  Diarrhea - please do the ozempic injections in your leg instead.   If you haven't already, sign up for My Chart to have easy access to your labs results, and communication with your primary care physician.  We are checking some labs today. If they are abnormal, I will call you. If they are normal, I will send you a MyChart message (if it is active) or a letter in the mail. If you do not hear about your labs in the next 2 weeks, please call the office.   You should return to our clinic 02/18/2023   I recommend that you always bring your medications to each appointment as this makes it easy to ensure you are on the correct medications and helps [place] not miss refills when you need them.  Please arrive 15 minutes before your appointment to ensure smooth check in process.  We appreciate your efforts in making this happen.  Please call the clinic at [phone] if your symptoms worsen or you have any concerns.  Thank you for allowing me to participate in your care, [name], MD 12/18/2022, 9:34 AM PGY-1, [place]

## 2022-12-18 NOTE — Assessment & Plan Note (Signed)
A1c much improved! Now 5.7 from 6.4. Has lost 13 lbs since September.  - Continue Ozempic 2 mg. Decrease dose if has trouble with diarrhea again. She says that it is controlled for now and has not had issues with the 2mg .  - Continue metformin  - Continue daily fasting BG  - Continue atorvastatin

## 2022-12-18 NOTE — Assessment & Plan Note (Signed)
Lesion on back appears to be tinea. Pt has history of eczema and tried triamcinalone for this without relief. Has other hyperpigmented scars on back that appear to be tinea. Picture of current lesion in media tab. Lesion is very itchy which is congruent with tinea diagnosis as well.

## 2022-12-19 LAB — MICROALBUMIN / CREATININE URINE RATIO
Creatinine, Urine: 201.3 mg/dL
Microalb/Creat Ratio: 6 mg/g creat (ref 0–29)
Microalbumin, Urine: 11.8 ug/mL

## 2022-12-19 LAB — BASIC METABOLIC PANEL
BUN/Creatinine Ratio: 32 — ABNORMAL HIGH (ref 12–28)
BUN: 24 mg/dL (ref 8–27)
CO2: 24 mmol/L (ref 20–29)
Calcium: 9.5 mg/dL (ref 8.7–10.3)
Chloride: 102 mmol/L (ref 96–106)
Creatinine, Ser: 0.74 mg/dL (ref 0.57–1.00)
Glucose: 93 mg/dL (ref 70–99)
Potassium: 4.1 mmol/L (ref 3.5–5.2)
Sodium: 138 mmol/L (ref 134–144)
eGFR: 91 mL/min/{1.73_m2} (ref >59)

## 2022-12-19 LAB — LIPID PANEL
Chol/HDL Ratio: 3.4 ratio (ref 0.0–4.4)
Cholesterol, Total: 150 mg/dL (ref 100–199)
HDL: 44 mg/dL (ref >39)
LDL Chol Calc (NIH): 87 mg/dL (ref 0–99)
Triglycerides: 102 mg/dL (ref 0–149)
VLDL Cholesterol Cal: 19 mg/dL (ref 5–40)

## 2022-12-22 NOTE — Progress Notes (Signed)
Your labs are relatively normal and as we would expect. Your microalbumin/creatinine ratio was elevated, but your diabetes is much better. So, we can talk about this at the next visit.

## 2022-12-23 ENCOUNTER — Telehealth: Payer: Self-pay

## 2022-12-23 NOTE — Telephone Encounter (Signed)
Talked with patient concerning TriVisc for right knee.

## 2022-12-23 NOTE — Telephone Encounter (Signed)
Patient called triage asking if you would give her a call She said she does want to do the Trivisc and she is asking what she needs to do next?

## 2022-12-23 NOTE — Telephone Encounter (Signed)
Submitted for TriVisc, right knee online through [employer] portal.  Patient is aware that she will receive a call from [employer] to pay. 

## 2022-12-23 NOTE — Telephone Encounter (Signed)
FYI-  Patient's medicaid policy does not cover gel injection.  Message has been sent to patient concerning gel injection.   Please advise on next option for patient.  Thank you

## 2022-12-23 NOTE — Telephone Encounter (Signed)
Per April and pt, pt will pay out of pocket for Trivisc and it will be delivered to our office, once she pays.

## 2022-12-31 ENCOUNTER — Other Ambulatory Visit: Payer: Self-pay

## 2022-12-31 DIAGNOSIS — M1712 Unilateral primary osteoarthritis, left knee: Secondary | ICD-10-CM

## 2022-12-31 DIAGNOSIS — M170 Bilateral primary osteoarthritis of knee: Secondary | ICD-10-CM

## 2023-01-02 ENCOUNTER — Other Ambulatory Visit: Payer: Self-pay

## 2023-01-02 DIAGNOSIS — E119 Type 2 diabetes mellitus without complications: Secondary | ICD-10-CM

## 2023-01-02 DIAGNOSIS — G5601 Carpal tunnel syndrome, right upper limb: Secondary | ICD-10-CM

## 2023-01-02 DIAGNOSIS — E785 Hyperlipidemia, unspecified: Secondary | ICD-10-CM

## 2023-01-02 MED ORDER — ATORVASTATIN CALCIUM 20 MG PO TABS: 20.0000 mg | ORAL_TABLET | Freq: Every evening | ORAL | 0 refills | Status: DC

## 2023-01-02 MED ORDER — SEMAGLUTIDE (2 MG/DOSE) 8 MG/3ML ~~LOC~~ SOPN: 2.0000 mg | PEN_INJECTOR | SUBCUTANEOUS | 0 refills | Status: DC

## 2023-01-02 MED ORDER — GABAPENTIN 600 MG PO TABS: 600.0000 mg | ORAL_TABLET | Freq: Every day | ORAL | 2 refills | Status: DC

## 2023-01-02 MED ORDER — METFORMIN HCL 500 MG PO TABS: 500.0000 mg | ORAL_TABLET | Freq: Every day | ORAL | 0 refills | Status: DC

## 2023-01-02 NOTE — Telephone Encounter (Signed)
Patient calls nurse line regarding rx refills. She states that she no longer is using [facility] and would like refills to be sent to [facility].  Will forward request to PCP.   Talbot Grumbling, RN

## 2023-01-05 ENCOUNTER — Other Ambulatory Visit: Payer: Medicaid Other

## 2023-01-05 NOTE — Patient Outreach (Signed)
Medicaid Managed Care Social Work Note  01/05/2023 Name:  Sandra Lozano MRN:  [id number] DOB:  04-13-1961  Sandra Lozano is an 62 y.o. year old female who is a primary patient of Sandra Mola, MD.  The [facility] Managed Care Coordination team was consulted for assistance with:  Food Insecurity  Sandra Lozano was given information about Medicaid Managed Care Coordination team services today. Sandra Lozano Patient agreed to services and verbal consent obtained.  Engaged with patient  for by telephone forfollow up visit in response to referral for case management and/or care coordination services.   Assessments/Interventions:  Review of past medical history, allergies, medications, health status, including review of consultants reports, laboratory and other test data, was performed as part of comprehensive evaluation and provision of chronic care management services.  SDOH: (Social Determinant of Health) assessments and interventions performed: SDOH Interventions    Flowsheet Row Patient Outreach Telephone from 12/03/2022 in [place] POPULATION HEALTH DEPARTMENT Patient Outreach Telephone from 11/03/2022 in [place] POPULATION HEALTH DEPARTMENT Patient Outreach Telephone from 09/24/2022 in [facility] Care Coordination Patient Outreach Telephone from 08/05/2021 in [facility] Care Coordination  SDOH Interventions      Food Insecurity Interventions -- Other (Comment)  [Referral to BSW] -- --  Housing Interventions -- -- Intervention Not Indicated --  Transportation Interventions -- -- Intervention Not Indicated Intervention Not Indicated  Utilities Interventions Intervention Not Indicated -- -- --  Physical Activity Interventions -- -- -- Intervention Not Indicated  Social Connections Interventions -- -- -- Intervention Not Indicated     BSW completed a telephone outreach with patient, she states she continues to be fabulous. She states she will be  going to get a shot for her knee this week due to the cold weather playing a part with knee paid. Patient states no resources are needed at this time. Patient agreed to a 60 day follow up instead of 30 day.  Advanced Directives Status:  Not addressed in this encounter.  Care Plan                 No Known Allergies  Medications Reviewed Today     Reviewed by Pamelia Hoit, CMA (Certified Medical Assistant) on 12/18/22 at 0859  Med List Status: <None>   Medication Order Taking? Sig Documenting Provider Last Dose Status Informant  Accu-Chek Softclix Lancets lancets [id number] No Use to check blood sugar once a day Sandra Mola, MD Taking Active   Acetaminophen (TYLENOL 8 HOUR ARTHRITIS PAIN PO) [id number] No Take by mouth. [provider] Taking Active   aspirin EC 325 MG tablet [id number] No Take 650 mg by mouth daily. Takes every morning [provider] Taking Active Self  atorvastatin (LIPITOR) 20 MG tablet [id number] No Take 1 tablet (20 mg total) by mouth every evening. Sandra Mola, MD Taking Active   betamethasone acetate-betamethasone sodium phosphate (CELESTONE) injection 3 mg [id number]   Felecia Shelling, DPM  Active   Blood Glucose Monitoring Suppl w/Device KIT [id number] No 1 kit by Does not apply route daily. Lavonda Jumbo, DO Taking Active   Blood Pressure Monitoring (BLOOD PRESSURE CUFF) MISC [id number] No Check your blood pressure in the morning when you wake up, and at night before you go to bed Autry-Lott, Randa Evens, DO Taking Active   cetirizine (ZYRTEC) 10 MG tablet [id number] No Take 1 tablet (10 mg total) by mouth daily. Autry-Lott, Randa Evens, DO Taking Active   diazepam (VALIUM) 5 MG tablet [id number] No Take one  tablet by mouth with food one hour prior to procedure. May repeat 30 minutes prior if needed. Lorine Bears, NP Taking Active            Med Note (ROBB, MELANIE A   Wed Dec 03, 2022  9:02 AM) completed  diclofenac Sodium (VOLTAREN) 1 % GEL  [id number] No APPLY 4 GRAMS TOPICALLY 4 (FOUR) TIMES DAILY. [name], MD Taking Active   fluticasone [facility]) 50 MCG/ACT nasal spray [id number] No Place 2 sprays into both nostrils daily. Autry-Lott, Naaman Plummer, DO Taking Active   gabapentin (NEURONTIN) 600 MG tablet [id number] No Take 1 tablet (600 mg total) by mouth at bedtime. [name], MD Taking Active   glucose blood (ACCU-CHEK AVIVA PLUS) test strip [id number] No Use as instructed [name], MD Taking Active   glucose blood test strip [id number] No Please use to check blood sugar once daily. [name], MD Taking Active   ketoconazole (NIZORAL) 2 % shampoo [id number] No Apply 1 Application topically 2 (two) times a week. [name], MD Taking Active   lisinopril-hydrochlorothiazide (ZESTORETIC) 20-12.5 MG tablet [id number] No Take 2 tablets by mouth daily. [name], MD Taking Active   meloxicam (MOBIC) 15 MG tablet [id number] No TAKE 1 TABLET (15 MG TOTAL) BY MOUTH DAILY. [name], MD Taking Active   metFORMIN (GLUCOPHAGE) 500 MG tablet [id number] No TAKE 1 TABLET (500 MG TOTAL) BY MOUTH DAILY WITH BREAKFAST. [name], MD Taking Active   methylPREDNISolone acetate (DEPO-MEDROL) injection 40 mg [id number]   Rodena Goldmann A, DO  Active   Omega-3 Fatty Acids (FISH OIL) 1000 MG CAPS [id number] No Take 1 capsule (1,000 mg total) by mouth at bedtime. Autry-Lott, Naaman Plummer, DO Taking Active   Semaglutide, 2 MG/DOSE, 8 MG/3ML SOPN [id number] No Inject 2 mg into the skin once a week. [name], MD Taking Active   terbinafine (LAMISIL) 1 % cream [id number] No Apply 1 Application topically 2 (two) times daily. Lind Covert, MD Taking Active   triamcinolone ointment (KENALOG) 0.1 % [id number] No Apply 1 Application topically 2 (two) times daily. Apply to scalp. Don't use more than 2 weeks in a row without physician approval. 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   Carpal tunnel syndrome, bilateral 12/21/2021   Arthritis of finger of left hand 11/26/2021   Traumatic arthritis of left foot    Arthritis of midfoot 03/14/2020   History of lumbar fusion 03/14/2020   Well controlled diabetes mellitus ([place]) 11/07/2019   Nummular eczema 07/06/2019   Lumbar stenosis 05/20/2019   Acute left-sided low back pain without sciatica 07/18/2018   Osteoarthritis of right hip 06/03/2018   Chronic right shoulder pain 05/05/2018   Essential hypertension 02/15/2018    Conditions to be addressed/monitored per PCP order:   food resources/follow up  Care Plan : [place]  Updates made by Ethelda Chick since 01/05/2023 12:00 AM     Problem: Chronic Disease Management and Care Coordination Needs realted to HTN, prediabetes, and chronic pain   Priority: High     Long-Range Goal: Development of Plan Of Care For Chronic Disease Management and Care Coordination Needs To Assist With Meeting Treatment Goals for HTN, prediabetes and chronic pain and HLD  Start Date: 08/05/2021  Expected End Date: 02/12/2023  Priority: High  Note:   Current Barriers:  Chronic Disease Management support and education needs related to HTN, prediabetes and chronic pain, HLD RNCM Clinical Goal(s):  Patient will verbalize understanding of plan for management of HTN , prediabetes and chronic pain through collaboration with RN Care manager, provider, and care team.   Interventions: Inter-disciplinary care team collaboration (see longitudinal plan of care) Evaluation of current treatment plan related to  self management and patient's adherence to plan as established by provider BSW referral for food insecurity BSW completed a telephone outreach with patient, she states she continues to be fabulous. She states she will  be going to get a shot for her knee this week due to the cold weather playing a part with knee paid. Patient states no resources are needed at this time. Patient agreed to a 60 day follow up instead of 30 day.   Hypertension: (Status: Goal on Track (progressing): YES.)Long Term- she attributes higher readings at provider's office due to pain and nervousness Last practice recorded BP readings:  BP Readings from Last 3 Encounters:  11/19/22 (!) 162/82  11/12/22 114/66  10/09/22 127/80        Most recent eGFR/CrCl:  Lab Results  Component Value Date   EGFR 98 10/09/2022    No components found for: "CRCL"  Evaluation of current treatment plan related to hypertension self management and patient's adherence to plan as established by provider;   Reviewed medications with patient and discussed importance of compliance;  Advised patient to take her BP monitor to her next office appointment to check accuracy Assessed BP readings in provider's office via EMR review Discussed plans with patient for ongoing care management follow up and provided patient with direct contact information for care management team; Advised patient to schedule a follow up visit with PCP   Pain Interventions:  (Status:  Goal on track:  Yes.) Long Term Goal-pain 5/10 Medications reviewed Reviewed provider established plan for pain management Discussed importance of adherence to all scheduled medical appointments Counseled on the importance of reporting any/all new or changed pain symptoms or management strategies to pain management provider Advised patient to report to care team affect of pain on daily activities Assessed social determinant of health barriers Advised patient to contact [place] for enhanced benefits, such as, gym membership, $10 monthly items and $250 to help with utilities and rent-revisited Reviewed provider notes, discussed recent back injection   Patient Goals/Self-Care  Activities: Patient will self administer medications as prescribed Patient will attend all scheduled provider appointments Patient will call pharmacy for medication refills Patient will continue to perform ADL's independently Patient will continue to perform IADL's independently Patient will call provider office for new concerns or questions        Follow up:  Patient agrees to Care Plan and Follow-up.  Plan: The Managed Medicaid care management team will reach out to the patient again over the next 60 days.  Date/time of next scheduled Social Work care management/care coordination outreach:  03/06/23  Mickel Fuchs, Arita Miss, [place] Medicaid Team  [phone]

## 2023-01-05 NOTE — Patient Instructions (Signed)
Visit Information  Ms. Holohan was given information about Medicaid Managed Care team care coordination services as a part of their [facility] Medicaid benefit. Kayelee Herbig verbally consented to engagement with the [facility] Managed Care team.   If you are experiencing a medical emergency, please call 911 or report to your local emergency department or urgent care.   If you have a non-emergency medical problem during routine business hours, please contact your provider's office and ask to speak with a nurse.   For questions related to your [facility] health plan, please call: [phone] or go here:[email or web address]  If you would like to schedule transportation through your [facility] plan, please call the following number at least 2 days in advance of your appointment: [phone].  You can also use the MTM portal or MTM mobile app to manage your rides. For the portal, please go to [email or web address].  Call the [place] at [phone], at any time, 24 hours a day, 7 days a week. If you are in danger or need immediate medical attention call 911.  If you would like help to quit smoking, call 1-800-QUIT-NOW 281-142-3313) OR Espaol: 1-855-Djelo-Ya (9-379-024-0973) o para ms informacin haga clic aqu or Text READY to 200-400 to register via text  Ms. Sistare - following are the goals we discussed in your visit today:   Goals Addressed   None      Social Worker will follow up on 03/06/23.   Mickel Fuchs, BSW, [place]  High Risk Managed Medicaid Team  [phone]   Following is a copy of your plan of care:  Care Plan : [place]  Updates made by Ethelda Chick since 01/05/2023 12:00 AM     Problem: Chronic Disease Management and Care Coordination Needs realted to HTN, prediabetes, and chronic pain   Priority: High     Long-Range Goal: Development of Plan Of Care For Chronic  Disease Management and Care Coordination Needs To Assist With Meeting Treatment Goals for HTN, prediabetes and chronic pain and HLD   Start Date: 08/05/2021  Expected End Date: 02/12/2023  Priority: High  Note:   Current Barriers:  Chronic Disease Management support and education needs related to HTN, prediabetes and chronic pain, HLD RNCM Clinical Goal(s):  Patient will verbalize understanding of plan for management of HTN , prediabetes and chronic pain through collaboration with RN Care manager, provider, and care team.   Interventions: Inter-disciplinary care team collaboration (see longitudinal plan of care) Evaluation of current treatment plan related to  self management and patient's adherence to plan as established by provider BSW referral for food insecurity BSW completed a telephone outreach with patient, she states she continues to be fabulous. She states she will be going to get a shot for her knee this week due to the cold weather playing a part with knee paid. Patient states no resources are needed at this time. Patient agreed to a 60 day follow up instead of 30 day.   Hypertension: (Status: Goal on Track (progressing): YES.)Long Term- she attributes higher readings at provider's office due to pain and nervousness Last practice recorded BP readings:  BP Readings from Last 3 Encounters:  11/19/22 (!) 162/82  11/12/22 114/66  10/09/22 127/80        Most recent eGFR/CrCl:  Lab Results  Component Value Date   EGFR 98 10/09/2022    No components found for: "CRCL"  Evaluation of current treatment plan related  to hypertension self management and patient's adherence to plan as established by provider;   Reviewed medications with patient and discussed importance of compliance;  Advised patient to take her BP monitor to her next office appointment to check accuracy Assessed BP readings in provider's office via EMR review Discussed plans with patient for ongoing care management  follow up and provided patient with direct contact information for care management team; Advised patient to schedule a follow up visit with PCP   Pain Interventions:  (Status:  Goal on track:  Yes.) Long Term Goal-pain 5/10 Medications reviewed Reviewed provider established plan for pain management Discussed importance of adherence to all scheduled medical appointments Counseled on the importance of reporting any/all new or changed pain symptoms or management strategies to pain management provider Advised patient to report to care team affect of pain on daily activities Assessed social determinant of health barriers Advised patient to contact [place] for enhanced benefits, such as, gym membership, $10 monthly items and $250 to help with utilities and rent-revisited Reviewed provider notes, discussed recent back injection   Patient Goals/Self-Care Activities: Patient will self administer medications as prescribed Patient will attend all scheduled provider appointments Patient will call pharmacy for medication refills Patient will continue to perform ADL's independently Patient will continue to perform IADL's independently Patient will call provider office for new concerns or questions

## 2023-01-06 ENCOUNTER — Encounter (INDEPENDENT_AMBULATORY_CARE_PROVIDER_SITE_OTHER): Payer: Medicaid Other | Admitting: Internal Medicine

## 2023-01-06 MED ORDER — LISINOPRIL-HYDROCHLOROTHIAZIDE 20-12.5 MG PO TABS: 2.0000 | ORAL_TABLET | Freq: Every day | ORAL | 3 refills | Status: DC

## 2023-01-07 ENCOUNTER — Ambulatory Visit (INDEPENDENT_AMBULATORY_CARE_PROVIDER_SITE_OTHER): Payer: Medicaid Other | Admitting: Family

## 2023-01-07 ENCOUNTER — Encounter: Payer: Self-pay | Admitting: Family

## 2023-01-07 DIAGNOSIS — M1711 Unilateral primary osteoarthritis, right knee: Secondary | ICD-10-CM

## 2023-01-07 MED ORDER — LIDOCAINE HCL 1 % IJ SOLN
1.0000 mL | INTRAMUSCULAR | Status: AC | PRN
  Administered 2023-01-07: 1 mL

## 2023-01-07 MED ORDER — SODIUM HYALURONATE (VISCOSUP) 25 MG/2.5ML IX SOSY
25.0000 mg | PREFILLED_SYRINGE | INTRA_ARTICULAR | Status: AC | PRN
  Administered 2023-01-07: 25 mg via INTRA_ARTICULAR

## 2023-01-07 NOTE — Progress Notes (Signed)
Office Visit Note   Patient: Sandra Lozano           Date of Birth: Jun 28, 1961           MRN: [id number] Visit Date: 01/07/2023              Requested by: [name], MD [place],  [place] [address] PCP: [name], MD  Chief Complaint  Patient presents with   Right Knee - Pain    Pt purchased Trivisc injection #1      HPI: The patient is a 62 year old woman who is seen for planned TriVisc injection right knee.  Her osteoarthritic symptoms have been significantly worse this winter she is walking with an antalgic gait.  Assessment & Plan: Visit Diagnoses: No diagnosis found.  Plan: TriVisc injection right knee.  Patient tolerated well.  She will follow-up next week for the next injection.  Follow-Up Instructions: No follow-ups on file.   Ortho Exam  Patient is alert, oriented, no adenopathy, well-dressed, normal affect, normal respiratory effort.   Imaging: No results found. No images are attached to the encounter.  Labs: Lab Results  Component Value Date   HGBA1C 5.7 12/18/2022   HGBA1C 6.4 08/19/2022   HGBA1C 6.1 08/22/2020   ESRSEDRATE 11 11/26/2021   LABURIC 5.2 11/26/2021     Lab Results  Component Value Date   ALBUMIN 4.0 05/18/2019    No results found for: "MG" No results found for: "VD25OH"  No results found for: "PREALBUMIN"    Latest Ref Rng & Units 11/02/2020    8:05 AM 05/21/2019    2:10 AM 05/18/2019   10:02 AM  CBC EXTENDED  WBC 4.0 - 10.5 K/uL  11.4  10.5   RBC 3.87 - 5.11 MIL/uL  3.35  4.78   Hemoglobin 12.0 - 15.0 g/dL 14.6  9.3  13.3   HCT 36.0 - 46.0 % 43.0  28.1  41.2   Platelets 150 - 400 K/uL  284  356      There is no height or weight on file to calculate BMI.  Orders:  No orders of the defined types were placed in this encounter.  No orders of the defined types were placed in this encounter.    Procedures: Large Joint Inj: R knee on 01/07/2023 8:48 AM Indications: pain Details: 18 G 1.5 in needle,  anteromedial approach Medications: 1 mL lidocaine 1 %; 25 mg Sodium Hyaluronate (Viscosup) 25 MG/2.5ML Consent was given by the patient.      Clinical Data: No additional findings.  ROS:  All other systems negative, except as noted in the HPI. Review of Systems  Constitutional: Negative.   Musculoskeletal:  Positive for arthralgias.    Objective: Vital Signs: LMP  (LMP Unknown)   Specialty Comments:  EXAM: MRI LUMBAR SPINE WITHOUT CONTRAST   TECHNIQUE: Multiplanar, multisequence MR imaging of the lumbar spine was performed. No intravenous contrast was administered.   COMPARISON:  Lumbar radiographs 09/28/2018   FINDINGS: Segmentation:  Normal   Alignment:  7 mm anterolisthesis L4-5.  Remaining alignment normal   Vertebrae: Negative for fracture or mass. Bone marrow edema at L4-5 related to discogenic change.   Conus medullaris and cauda equina: Conus extends to the L1-2 level. Conus and cauda equina appear normal.   Paraspinal and other soft tissues: Negative for paraspinous mass or fluid collection   Disc levels:   L1-2: Negative   L2-3: Negative   L3-4: Mild facet degeneration. Negative for disc  protrusion or stenosis   L4-5: 7 mm anterolisthesis. Severe facet degeneration bilaterally. Severe disc degeneration with endplate erosive change and edema. Severe spinal stenosis. Severe subarticular foraminal stenosis left greater than right   L5-S1: Negative   IMPRESSION: Severe spinal stenosis L4-5 with severe subarticular foraminal stenosis bilaterally left greater than right. Grade 1 anterolisthesis L4-5. Remaining lumbar spine negative     Electronically Signed   By: Franchot Gallo M.D.   On: 11/13/2018 15:34  PMFS History: Patient Active Problem List   Diagnosis Date Noted   Tinea 12/18/2022   Left shoulder pain 08/25/2022   DJD (degenerative joint disease), multiple sites 08/06/2022   COPD, mild ([place]) 02/11/2022   Carpal tunnel syndrome,  bilateral 12/21/2021   Arthritis of finger of left hand 11/26/2021   Traumatic arthritis of left foot    Arthritis of midfoot 03/14/2020   History of lumbar fusion 03/14/2020   Well controlled diabetes mellitus ([place]) 11/07/2019   Nummular eczema 07/06/2019   Lumbar stenosis 05/20/2019   Acute left-sided low back pain without sciatica 07/18/2018   Osteoarthritis of right hip 06/03/2018   Chronic right shoulder pain 05/05/2018   Essential hypertension 02/15/2018   Past Medical History:  Diagnosis Date   Arthritis    right knee, lower back   Chest wall pain 04/20/2021   Dyspnea    very rare -tx with albuterol neb sol if needed   Hyperlipidemia    Hypertension    Intertrigo 08/06/2022   Pre-diabetes    per patient - does not check blood sugar   Right hip pain 05/05/2018   Rupture of anterior cruciate ligament of right knee 02/15/2018   Sprain of medial collateral ligament of right knee 02/15/2018   SVD (spontaneous vaginal delivery)    x 5 - only 2 living, 1 stillborn and 1 premature at 7 months demise,1 child deceased   Wears dentures    upper and lower    Family History  Problem Relation Age of Onset   Cancer Sister    Healthy Daughter    Healthy Daughter     Past Surgical History:  Procedure Laterality Date   FOOT ARTHRODESIS Left 11/02/2020   Procedure: LEFT MIDFOOT FUSION BASE 1ST AND 2ND METATARSAL;  Surgeon: Newt Minion, MD;  Location: [facility];  Service: Orthopedics;  Laterality: Left;   LUMBAR FUSION  05/20/2019   GILL PROCEDURE, LEFT TRANSFORAMINAL LUMBAR INTERBODY FUSION, PEDICLE INSTRUMENTATION, BILATERAL FUSION (N/A   TUBAL LIGATION     Social History   Occupational History   Not on file  Tobacco Use   Smoking status: Former    Packs/day: 1.00    Years: 30.00    Total pack years: 30.00    Types: Cigarettes    Quit date: 01/2015    Years since quitting: 7.9    Passive exposure: Past   Smokeless tobacco: Never  Vaping Use   Vaping Use: Never used   Substance and Sexual Activity   Alcohol use: Not Currently   Drug use: Not Currently    Types: Marijuana    Comment: stopped 12/15/2021   Sexual activity: Not on file

## 2023-01-08 ENCOUNTER — Other Ambulatory Visit (HOSPITAL_BASED_OUTPATIENT_CLINIC_OR_DEPARTMENT_OTHER): Payer: Self-pay

## 2023-01-14 ENCOUNTER — Encounter: Payer: Self-pay | Admitting: Family

## 2023-01-14 ENCOUNTER — Ambulatory Visit (INDEPENDENT_AMBULATORY_CARE_PROVIDER_SITE_OTHER): Payer: Medicaid Other | Admitting: Family

## 2023-01-14 DIAGNOSIS — M1711 Unilateral primary osteoarthritis, right knee: Secondary | ICD-10-CM | POA: Diagnosis not present

## 2023-01-14 MED ORDER — LIDOCAINE HCL 1 % IJ SOLN
1.0000 mL | INTRAMUSCULAR | Status: AC | PRN
  Administered 2023-01-14: 1 mL

## 2023-01-14 MED ORDER — SODIUM HYALURONATE (VISCOSUP) 25 MG/2.5ML IX SOSY
25.0000 mg | PREFILLED_SYRINGE | INTRA_ARTICULAR | Status: AC | PRN
  Administered 2023-01-14: 25 mg via INTRA_ARTICULAR

## 2023-01-14 NOTE — Progress Notes (Addendum)
Office Visit Note   Patient: Sandra Lozano           Date of Birth: 12-11-1961           MRN: [id number] Visit Date: 01/14/2023              Requested by: [name], MD [address] [place],  [place] [address] PCP: [name], MD  Chief Complaint  Patient presents with   Right Knee - Follow-up    Trivisc #2      HPI: The patient is a 62 year old woman who is seen for planned TriVisc injection right knee, #2.  Her osteoarthritic symptoms have been significantly worse this winter she is walking with an antalgic gait.  Assessment & Plan: Visit Diagnoses: No diagnosis found.  Plan: TriVisc injection right knee.  Patient tolerated well.  She will follow-up next week for the next injection.  Follow-Up Instructions: Return in about 1 week (around 01/21/2023).   Ortho Exam  Patient is alert, oriented, no adenopathy, well-dressed, normal affect, normal respiratory effort.   Imaging: No results found. No images are attached to the encounter.  Labs: Lab Results  Component Value Date   HGBA1C 5.7 12/18/2022   HGBA1C 6.4 08/19/2022   HGBA1C 6.1 08/22/2020   ESRSEDRATE 11 11/26/2021   LABURIC 5.2 11/26/2021     Lab Results  Component Value Date   ALBUMIN 4.0 05/18/2019    No results found for: "MG" No results found for: "VD25OH"  No results found for: "PREALBUMIN"    Latest Ref Rng & Units 11/02/2020    8:05 AM 05/21/2019    2:10 AM 05/18/2019   10:02 AM  CBC EXTENDED  WBC 4.0 - 10.5 K/uL  11.4  10.5   RBC 3.87 - 5.11 MIL/uL  3.35  4.78   Hemoglobin 12.0 - 15.0 g/dL 14.6  9.3  13.3   HCT 36.0 - 46.0 % 43.0  28.1  41.2   Platelets 150 - 400 K/uL  284  356      There is no height or weight on file to calculate BMI.  Orders:  No orders of the defined types were placed in this encounter.  No orders of the defined types were placed in this encounter.    Procedures: Large Joint Inj: R knee on 01/14/2023 8:55 AM Indications: pain Details: 18 G 1.5 in  needle, anteromedial approach Medications: 1 mL lidocaine 1 %; 25 mg Sodium Hyaluronate (Viscosup) 25 MG/2.5ML Consent was given by the patient.      Clinical Data: No additional findings.  ROS:  All other systems negative, except as noted in the HPI. Review of Systems  Constitutional: Negative.   Musculoskeletal:  Positive for arthralgias.    Objective: Vital Signs: LMP  (LMP Unknown)   Specialty Comments:  EXAM: MRI LUMBAR SPINE WITHOUT CONTRAST   TECHNIQUE: Multiplanar, multisequence MR imaging of the lumbar spine was performed. No intravenous contrast was administered.   COMPARISON:  Lumbar radiographs 09/28/2018   FINDINGS: Segmentation:  Normal   Alignment:  7 mm anterolisthesis L4-5.  Remaining alignment normal   Vertebrae: Negative for fracture or mass. Bone marrow edema at L4-5 related to discogenic change.   Conus medullaris and cauda equina: Conus extends to the L1-2 level. Conus and cauda equina appear normal.   Paraspinal and other soft tissues: Negative for paraspinous mass or fluid collection   Disc levels:   L1-2: Negative   L2-3: Negative   L3-4: Mild facet degeneration. Negative for  disc protrusion or stenosis   L4-5: 7 mm anterolisthesis. Severe facet degeneration bilaterally. Severe disc degeneration with endplate erosive change and edema. Severe spinal stenosis. Severe subarticular foraminal stenosis left greater than right   L5-S1: Negative   IMPRESSION: Severe spinal stenosis L4-5 with severe subarticular foraminal stenosis bilaterally left greater than right. Grade 1 anterolisthesis L4-5. Remaining lumbar spine negative     Electronically Signed   By: Franchot Gallo M.D.   On: 11/13/2018 15:34  PMFS History: Patient Active Problem List   Diagnosis Date Noted   Tinea 12/18/2022   Left shoulder pain 08/25/2022   DJD (degenerative joint disease), multiple sites 08/06/2022   COPD, mild ([place]) 02/11/2022   Carpal tunnel  syndrome, bilateral 12/21/2021   Arthritis of finger of left hand 11/26/2021   Traumatic arthritis of left foot    Arthritis of midfoot 03/14/2020   History of lumbar fusion 03/14/2020   Well controlled diabetes mellitus ([place]) 11/07/2019   Nummular eczema 07/06/2019   Lumbar stenosis 05/20/2019   Acute left-sided low back pain without sciatica 07/18/2018   Osteoarthritis of right hip 06/03/2018   Chronic right shoulder pain 05/05/2018   Essential hypertension 02/15/2018   Past Medical History:  Diagnosis Date   Arthritis    right knee, lower back   Chest wall pain 04/20/2021   Dyspnea    very rare -tx with albuterol neb sol if needed   Hyperlipidemia    Hypertension    Intertrigo 08/06/2022   Pre-diabetes    per patient - does not check blood sugar   Right hip pain 05/05/2018   Rupture of anterior cruciate ligament of right knee 02/15/2018   Sprain of medial collateral ligament of right knee 02/15/2018   SVD (spontaneous vaginal delivery)    x 5 - only 2 living, 1 stillborn and 1 premature at 7 months demise,1 child deceased   Wears dentures    upper and lower    Family History  Problem Relation Age of Onset   Cancer Sister    Healthy Daughter    Healthy Daughter     Past Surgical History:  Procedure Laterality Date   FOOT ARTHRODESIS Left 11/02/2020   Procedure: LEFT MIDFOOT FUSION BASE 1ST AND 2ND METATARSAL;  Surgeon: Newt Minion, MD;  Location: [facility];  Service: Orthopedics;  Laterality: Left;   LUMBAR FUSION  05/20/2019   GILL PROCEDURE, LEFT TRANSFORAMINAL LUMBAR INTERBODY FUSION, PEDICLE INSTRUMENTATION, BILATERAL FUSION (N/A   TUBAL LIGATION     Social History   Occupational History   Not on file  Tobacco Use   Smoking status: Former    Packs/day: 1.00    Years: 30.00    Total pack years: 30.00    Types: Cigarettes    Quit date: 01/2015    Years since quitting: 8.0    Passive exposure: Past   Smokeless tobacco: Never  Vaping Use   Vaping Use: Never used   Substance and Sexual Activity   Alcohol use: Not Currently   Drug use: Not Currently    Types: Marijuana    Comment: stopped 12/15/2021   Sexual activity: Not on file

## 2023-01-15 DIAGNOSIS — Z419 Encounter for procedure for purposes other than remedying health state, unspecified: Secondary | ICD-10-CM | POA: Diagnosis not present

## 2023-01-21 ENCOUNTER — Encounter: Payer: Self-pay | Admitting: Family

## 2023-01-21 ENCOUNTER — Ambulatory Visit (INDEPENDENT_AMBULATORY_CARE_PROVIDER_SITE_OTHER): Payer: Medicaid Other | Admitting: Family

## 2023-01-21 DIAGNOSIS — M1711 Unilateral primary osteoarthritis, right knee: Secondary | ICD-10-CM

## 2023-01-21 MED ORDER — METHYLPREDNISOLONE ACETATE 40 MG/ML IJ SUSP
40.0000 mg | INTRAMUSCULAR | Status: AC | PRN
  Administered 2023-01-21: 40 mg via INTRA_ARTICULAR

## 2023-01-21 MED ORDER — LIDOCAINE HCL 1 % IJ SOLN
5.0000 mL | INTRAMUSCULAR | Status: AC | PRN
  Administered 2023-01-21: 5 mL

## 2023-01-21 NOTE — Progress Notes (Signed)
Office Visit Note   Patient: Sandra Lozano           Date of Birth: 19-Oct-1961           MRN: [id number] Visit Date: 01/21/2023              Requested by: [name], MD [place],  [place] [address] PCP: [name], MD  Chief Complaint  Patient presents with   Right Knee - Follow-up    Trivisc #3      HPI: The patient is a 62 year old woman who is seen for planned TriVisc injection right knee, #3.   Assessment & Plan: Visit Diagnoses: No diagnosis found.  Plan: TriVisc injection right knee.  Patient tolerated well.  Follow up as needed.  Follow-Up Instructions: No follow-ups on file.   Ortho Exam  Patient is alert, oriented, no adenopathy, well-dressed, normal affect, normal respiratory effort.   Imaging: No results found. No images are attached to the encounter.  Labs: Lab Results  Component Value Date   HGBA1C 5.7 12/18/2022   HGBA1C 6.4 08/19/2022   HGBA1C 6.1 08/22/2020   ESRSEDRATE 11 11/26/2021   LABURIC 5.2 11/26/2021     Lab Results  Component Value Date   ALBUMIN 4.0 05/18/2019    No results found for: "MG" No results found for: "VD25OH"  No results found for: "PREALBUMIN"    Latest Ref Rng & Units 11/02/2020    8:05 AM 05/21/2019    2:10 AM 05/18/2019   10:02 AM  CBC EXTENDED  WBC 4.0 - 10.5 K/uL  11.4  10.5   RBC 3.87 - 5.11 MIL/uL  3.35  4.78   Hemoglobin 12.0 - 15.0 g/dL 14.6  9.3  13.3   HCT 36.0 - 46.0 % 43.0  28.1  41.2   Platelets 150 - 400 K/uL  284  356      There is no height or weight on file to calculate BMI.  Orders:  No orders of the defined types were placed in this encounter.  No orders of the defined types were placed in this encounter.    Procedures: Large Joint Inj: R knee on 01/21/2023 8:36 AM Indications: pain Details: 18 G 1.5 in needle, anteromedial approach Medications: 5 mL lidocaine 1 %; 40 mg methylPREDNISolone acetate 40 MG/ML Consent was given by the patient.     Clinical  Data: No additional findings.  ROS:  All other systems negative, except as noted in the HPI. Review of Systems  Constitutional: Negative.   Musculoskeletal:  Positive for arthralgias.    Objective: Vital Signs: LMP  (LMP Unknown)   Specialty Comments:  EXAM: MRI LUMBAR SPINE WITHOUT CONTRAST   TECHNIQUE: Multiplanar, multisequence MR imaging of the lumbar spine was performed. No intravenous contrast was administered.   COMPARISON:  Lumbar radiographs 09/28/2018   FINDINGS: Segmentation:  Normal   Alignment:  7 mm anterolisthesis L4-5.  Remaining alignment normal   Vertebrae: Negative for fracture or mass. Bone marrow edema at L4-5 related to discogenic change.   Conus medullaris and cauda equina: Conus extends to the L1-2 level. Conus and cauda equina appear normal.   Paraspinal and other soft tissues: Negative for paraspinous mass or fluid collection   Disc levels:   L1-2: Negative   L2-3: Negative   L3-4: Mild facet degeneration. Negative for disc protrusion or stenosis   L4-5: 7 mm anterolisthesis. Severe facet degeneration bilaterally. Severe disc degeneration with endplate erosive change and edema. Severe spinal stenosis.  Severe subarticular foraminal stenosis left greater than right   L5-S1: Negative   IMPRESSION: Severe spinal stenosis L4-5 with severe subarticular foraminal stenosis bilaterally left greater than right. Grade 1 anterolisthesis L4-5. Remaining lumbar spine negative     Electronically Signed   By: Franchot Gallo M.D.   On: 11/13/2018 15:34  PMFS History: Patient Active Problem List   Diagnosis Date Noted   Tinea 12/18/2022   Left shoulder pain 08/25/2022   DJD (degenerative joint disease), multiple sites 08/06/2022   COPD, mild ([place]) 02/11/2022   Carpal tunnel syndrome, bilateral 12/21/2021   Arthritis of finger of left hand 11/26/2021   Traumatic arthritis of left foot    Arthritis of midfoot 03/14/2020   History of  lumbar fusion 03/14/2020   Well controlled diabetes mellitus ([place]) 11/07/2019   Nummular eczema 07/06/2019   Lumbar stenosis 05/20/2019   Acute left-sided low back pain without sciatica 07/18/2018   Osteoarthritis of right hip 06/03/2018   Chronic right shoulder pain 05/05/2018   Essential hypertension 02/15/2018   Past Medical History:  Diagnosis Date   Arthritis    right knee, lower back   Chest wall pain 04/20/2021   Dyspnea    very rare -tx with albuterol neb sol if needed   Hyperlipidemia    Hypertension    Intertrigo 08/06/2022   Pre-diabetes    per patient - does not check blood sugar   Right hip pain 05/05/2018   Rupture of anterior cruciate ligament of right knee 02/15/2018   Sprain of medial collateral ligament of right knee 02/15/2018   SVD (spontaneous vaginal delivery)    x 5 - only 2 living, 1 stillborn and 1 premature at 7 months demise,1 child deceased   Wears dentures    upper and lower    Family History  Problem Relation Age of Onset   Cancer Sister    Healthy Daughter    Healthy Daughter     Past Surgical History:  Procedure Laterality Date   FOOT ARTHRODESIS Left 11/02/2020   Procedure: LEFT MIDFOOT FUSION BASE 1ST AND 2ND METATARSAL;  Surgeon: Newt Minion, MD;  Location: [facility];  Service: Orthopedics;  Laterality: Left;   LUMBAR FUSION  05/20/2019   GILL PROCEDURE, LEFT TRANSFORAMINAL LUMBAR INTERBODY FUSION, PEDICLE INSTRUMENTATION, BILATERAL FUSION (N/A   TUBAL LIGATION     Social History   Occupational History   Not on file  Tobacco Use   Smoking status: Former    Packs/day: 1.00    Years: 30.00    Total pack years: 30.00    Types: Cigarettes    Quit date: 01/2015    Years since quitting: 8.0    Passive exposure: Past   Smokeless tobacco: Never  Vaping Use   Vaping Use: Never used  Substance and Sexual Activity   Alcohol use: Not Currently   Drug use: Not Currently    Types: Marijuana    Comment: stopped 12/15/2021   Sexual activity: Not  on file

## 2023-01-22 MED ORDER — MELOXICAM 15 MG PO TABS: 15.0000 mg | ORAL_TABLET | Freq: Every day | ORAL | 1 refills | Status: DC

## 2023-01-22 NOTE — Addendum Note (Signed)
Addended by: Dorna Bloom on: 01/22/2023 08:18 AM   Modules accepted: Orders

## 2023-01-26 ENCOUNTER — Other Ambulatory Visit: Payer: Self-pay | Admitting: Family Medicine

## 2023-01-26 DIAGNOSIS — E119 Type 2 diabetes mellitus without complications: Secondary | ICD-10-CM

## 2023-02-04 ENCOUNTER — Other Ambulatory Visit: Payer: Self-pay

## 2023-02-04 ENCOUNTER — Other Ambulatory Visit: Payer: Medicaid Other | Admitting: *Deleted

## 2023-02-04 ENCOUNTER — Encounter: Payer: Self-pay | Admitting: Family Medicine

## 2023-02-04 ENCOUNTER — Ambulatory Visit (INDEPENDENT_AMBULATORY_CARE_PROVIDER_SITE_OTHER): Payer: Medicaid Other | Admitting: Family Medicine

## 2023-02-04 VITALS — BP 170/65 | HR 89 | Ht 64.0 in | Wt 200.2 lb

## 2023-02-04 DIAGNOSIS — L989 Disorder of the skin and subcutaneous tissue, unspecified: Secondary | ICD-10-CM

## 2023-02-04 MED ORDER — TRIAMCINOLONE ACETONIDE 0.5 % EX OINT: 1.0000 | TOPICAL_OINTMENT | Freq: Two times a day (BID) | CUTANEOUS | 0 refills | Status: DC

## 2023-02-04 NOTE — Patient Instructions (Addendum)
It was great to see you today! Here's what we talked about:  I have sent in a more potent steroid cream for you. Be sure to use this triamcinolone 0.5% cream ONLY. Use this twice a day on the area for 2-4 weeks. If the area continues to not improve, please let us know. Be sure to bring in ALL your creams at your next visit.  Please let me know if you have any other questions.  Dr. Marcha Dutton

## 2023-02-04 NOTE — Patient Outreach (Signed)
  Medicaid Managed Care   Unsuccessful Attempt Note   02/04/2023 Name: Sandra Lozano MRN: [id number] DOB: [birth year]  Referred by: [name], MD Reason for referral : High Risk Managed Medicaid (Unsuccessful RNCM follow up telephone outreach)   An unsuccessful telephone outreach was attempted today. The patient was referred to the case management team for assistance with care management and care coordination.    Follow Up Plan: A HIPAA compliant phone message was left for the patient providing contact information and requesting a return call. and The Managed Medicaid care management team will reach out to the patient again over the next 7 days.    Lurena Joiner RN, BSN [place]  [employer] manager

## 2023-02-04 NOTE — Progress Notes (Unsigned)
    SUBJECTIVE:   CHIEF COMPLAINT / HPI:   Skin lesion Lesion on her back has persisted despite ketoconazole cream prescribed at last visit 12/18/2022. She has also tried low potency triamcinolone. She has often mixed these two medicines together. She has also tried OTC jock itch and fungal cream without relief. The lesion continues to be itchy. It has now been there for 3 months. It is not painful. She has not had this before. It has not had any drainage or bleeding. She does note an itchy lesion on her scalp that she has been using the same medications on without relief.  OBJECTIVE:   BP (!) 170/65   Pulse 89   Ht 5' 4"$  (1.626 m)   Wt 200 lb 3.2 oz (90.8 kg)   LMP  (LMP Unknown)   SpO2 100%   BMI 34.36 kg/m   General: Alert and oriented, in NAD Skin: Warm, dry; sparsely hyperpigmented patch on left upper back without evidence of excoriation, drainage, or bleeding HEENT: NCAT, EOM grossly normal, midline nasal septum Extremities: Moves all extremities grossly equally Neurological: No gross focal deficit Psychiatric: Appropriate mood and affect    ASSESSMENT/PLAN:   Skin lesion Lesion likely not fungal given patient report of consistent antifungal use. Could consider tinea incognito however given she states she sometimes mixed the steroid and antifungal together. Consider nummular eczema though appearance not as consistent with raised, dry patches. Will prescribe triamcinolone 0.5% BID for 2-4 weeks until lesion resolves. If no resolution at this point, could consider skin shave/biopsy vs dermatology referral.   HTN Patient has history of white coat HTN and states it is always elevated in the office. She has been logging it at home and states the values are normal. Patient does not endorse other symptoms other than skin lesion today. Will continue current medication management for now and alter regimen based on BP log discussed at last visit with PCP.  Ethelene Hal, MD [place]

## 2023-02-04 NOTE — Patient Instructions (Signed)
Visit Information  Ms. [name]  - as a part of your Medicaid benefit, you are eligible for care management and care coordination services at no cost or copay. I was unable to reach you by phone today but would be happy to help you with your health related needs. Please feel free to call me @ [phone].   A member of the Managed Medicaid care management team will reach out to you again over the next 7 days.   Lurena Joiner RN, BSN   [employer] manager

## 2023-02-09 ENCOUNTER — Telehealth: Payer: Self-pay

## 2023-02-09 NOTE — Telephone Encounter (Signed)
..   Medicaid Managed Care   Unsuccessful Outreach Note  02/09/2023 Name: Sandra Lozano MRN: [id number] DOB: Nov 15, 1961  Referred by: [name], MD Reason for referral : Appointment (I called the patient today to get her rescheduled for his  missed phone appt with the MM RNCM. I left my name and number on her VM.)   A second unsuccessful telephone outreach was attempted today. The patient was referred to the case management team for assistance with care management and care coordination.   Follow Up Plan: A HIPAA compliant phone message was left for the patient providing contact information and requesting a return call.  The care management team will reach out to the patient again over the next 7 days.   [place]

## 2023-02-13 DIAGNOSIS — Z419 Encounter for procedure for purposes other than remedying health state, unspecified: Secondary | ICD-10-CM | POA: Diagnosis not present

## 2023-02-18 ENCOUNTER — Ambulatory Visit (INDEPENDENT_AMBULATORY_CARE_PROVIDER_SITE_OTHER): Payer: Medicaid Other | Admitting: Family Medicine

## 2023-02-18 ENCOUNTER — Encounter: Payer: Self-pay | Admitting: Family Medicine

## 2023-02-18 VITALS — BP 138/80 | HR 78 | Ht 64.0 in | Wt 193.0 lb

## 2023-02-18 DIAGNOSIS — R21 Rash and other nonspecific skin eruption: Secondary | ICD-10-CM | POA: Diagnosis not present

## 2023-02-18 DIAGNOSIS — N898 Other specified noninflammatory disorders of vagina: Secondary | ICD-10-CM

## 2023-02-18 DIAGNOSIS — R300 Dysuria: Secondary | ICD-10-CM | POA: Diagnosis not present

## 2023-02-18 DIAGNOSIS — L989 Disorder of the skin and subcutaneous tissue, unspecified: Secondary | ICD-10-CM

## 2023-02-18 LAB — POCT WET PREP (WET MOUNT)
Clue Cells Wet Prep Whiff POC: POSITIVE
Trichomonas Wet Prep HPF POC: ABSENT
WBC, Wet Prep HPF POC: NONE SEEN

## 2023-02-18 MED ORDER — TRIAMCINOLONE ACETONIDE 0.5 % EX OINT: 1.0000 | TOPICAL_OINTMENT | Freq: Two times a day (BID) | CUTANEOUS | 0 refills | Status: DC

## 2023-02-18 MED ORDER — METRONIDAZOLE 500 MG PO TABS: 500.0000 mg | ORAL_TABLET | Freq: Three times a day (TID) | ORAL | 0 refills | Status: DC

## 2023-02-18 MED ORDER — BENADRYL ITCH RELIEF 2-0.1 % EX STCK: CUTANEOUS | 1 refills | Status: DC

## 2023-02-18 NOTE — Progress Notes (Signed)
    SUBJECTIVE:   CHIEF COMPLAINT / HPI:   Vaginal Irritation  Patient says that when she urinates it causes burning of her labia to the point that she has to pull her labia out of the way while she urinates. Denies dysuria. Denies increase or change in discharge. Denies pelvic pain. Patient reports that she feels very dry when she has intercourse with her husband, but wonders if she is not aroused enough, as she feels like she has more lubrication when she has intercourse with a different sexual partner.  Says she has not had a period in about 20 years and does continue to have hot flashes.   Scalp lesion  Patient reports that she continues to have the same scalp lesion that she had previously. She has been using ketoconazole shampoo. She still itches at the lesion, but says now she has noticed some hair starting to grow there as it did not before. She says she does aggressively try to exfoliate it with an exfoliating brush.   Rash on back  Patient reports that the lesion on her back is still very itchy. She keeps hydrocortisone in her purse as she feels like she has to put it on daily to help with itching.   PERTINENT  PMH / PSH:   OBJECTIVE:   BP 138/80   Pulse 78   Ht 5\' 4"  (1.626 m)   Wt 193 lb (87.5 kg)   LMP  (LMP Unknown)   SpO2 96%   BMI 33.13 kg/m   General: well appearing, in no acute distress CV: RRR, radial pulses equal and palpable, no BLE edema  Resp: Normal work of breathing on room air, CTAB Abd: Soft, non tender, non distended  Neuro: Alert & Oriented x 4  Derm: Scalp with small round lesion ulcerated and scabbed with surrounding hyperkeratosis and hyperpigmentation with short hairs formed, 4cm lesion on back with irregular borders, not raised, hyperpigmented evenly (pictures of both in media)   ASSESSMENT/PLAN:   Vaginal irritation Patient was positive for BV. She is post menopausal with some symptoms of vaginal atrophy, but has also used substances like ky  jelly as lubricant which can decrease lactobacillus increasing risk for BV.  - Counseled on safe lubricants and provided list.  - Metronidazole 500 mg for one week   Scalp lesion Scalp lesion persists despite ketaconazole treatment. KOH scraping was negative in the past. Patient does continue to cause irritation with excessive exfoliation. Now there is some hair growth on the lesion, thus unlikely alopecia areata. (Picture in media)  - Scheduled appointment with derm clinic for biopsy  - Counseled regarding excessive exfoliation.   Skin lesion of back Patient has long standing skin lesion of her back. Seemed eczematous. Has tried hydrocortisone cream and lesion has improved in appearance (pictures in media) however, continues to be itchy. Does not seem melanotic. Will continue to follow.  - Continue hydrocortisone cream  - Ordered benadryl stick      [name], MD [place]

## 2023-02-18 NOTE — Patient Instructions (Signed)
It was wonderful to see you today.  Please bring ALL of your medications with you to every visit.   Today we talked about:  Vaginal irritation - Your test was positive for BV an infection in your vagina. I am going to prescribe flagyl and antibiotic for one week.  You can also use the natural lubricants on the list for dryness. Avoid vaseline and KY jelly.   For your back - continue using the triamcinalone cream. For your scalp we will schedule you in Derm clinic to get a biopsy.   Please follow up as needed or for your physical.   Thank you for choosing [facility].   Please call [phone] with any questions about today's appointment.  Please be sure to schedule follow up at the front desk before you leave today.   [name], MD  Family Medicine

## 2023-02-21 DIAGNOSIS — L989 Disorder of the skin and subcutaneous tissue, unspecified: Secondary | ICD-10-CM | POA: Insufficient documentation

## 2023-02-21 DIAGNOSIS — N898 Other specified noninflammatory disorders of vagina: Secondary | ICD-10-CM | POA: Insufficient documentation

## 2023-02-21 NOTE — Assessment & Plan Note (Signed)
Patient has long standing skin lesion of her back. Seemed eczematous. Has tried hydrocortisone cream and lesion has improved in appearance (pictures in media) however, continues to be itchy. Does not seem melanotic. Will continue to follow.  - Continue hydrocortisone cream  - Ordered benadryl stick

## 2023-02-21 NOTE — Assessment & Plan Note (Signed)
Patient was positive for BV. She is post menopausal with some symptoms of vaginal atrophy, but has also used substances like ky jelly as lubricant which can decrease lactobacillus increasing risk for BV.  - Counseled on safe lubricants and provided list.  - Metronidazole 500 mg for one week

## 2023-02-21 NOTE — Assessment & Plan Note (Addendum)
Scalp lesion persists despite ketaconazole treatment. KOH scraping was negative in the past. Patient does continue to cause irritation with excessive exfoliation. Now there is some hair growth on the lesion, thus unlikely alopecia areata. (Picture in media)  - Scheduled appointment with derm clinic for biopsy  - Counseled regarding excessive exfoliation.

## 2023-02-23 ENCOUNTER — Encounter: Payer: Self-pay | Admitting: Orthopedic Surgery

## 2023-02-23 ENCOUNTER — Ambulatory Visit (INDEPENDENT_AMBULATORY_CARE_PROVIDER_SITE_OTHER): Payer: Medicaid Other | Admitting: Orthopedic Surgery

## 2023-02-23 DIAGNOSIS — M5416 Radiculopathy, lumbar region: Secondary | ICD-10-CM | POA: Diagnosis not present

## 2023-02-23 DIAGNOSIS — M205X2 Other deformities of toe(s) (acquired), left foot: Secondary | ICD-10-CM

## 2023-02-23 DIAGNOSIS — M6702 Short Achilles tendon (acquired), left ankle: Secondary | ICD-10-CM

## 2023-02-23 NOTE — Progress Notes (Signed)
Office Visit Note   Patient: Sandra Lozano           Date of Birth: [birth year]           MRN: [id number] Visit Date: 02/23/2023              Requested by: [name], MD [address] [place],  [place] [address] PCP: [name], MD  Chief Complaint  Patient presents with   Left Foot - Pain      HPI: Patient is a 62 year old woman who presents with clawing of the left foot worse in the second toe than the other lesser toes.  Patient has also undergone a epidural steroid injection for her right-sided radicular pain.  She had her last injection November 29.  Patient complains of numbness across the ball of her foot.  Assessment & Plan: Visit Diagnoses:  1. Lumbar radiculopathy   2. Acquired claw toe, left   3. Achilles tendon contracture, left     Plan: Patient was given instructions and demonstrated Achilles stretching to unload the pressure on the ball of her foot.  Discussed risks and benefits of claw toe surgery.  Patient states she would like to proceed with Achilles stretching and follow-up as needed if she needs claw toe surgery or a gastrocnemius recession.  We will place an order with Dr. Ernestina Patches for evaluation for repeat epidural steroid injection.  Follow-Up Instructions: Return if symptoms worsen or fail to improve.   Ortho Exam  Patient is alert, oriented, no adenopathy, well-dressed, normal affect, normal respiratory effort. Examination patient has a palpable pulse she has dorsiflexion of the ankle about 20 degrees short of neutral with her knee extended she has clawing of the toes and with the Achilles contracture she is overloading the forefoot causing pain and numbness over the ball of her foot.  She has fixed clawing of toes 2 3 and 4 worse in the second toe.  No ulcers beneath the metatarsal heads.  Imaging: No results found. No images are attached to the encounter.  Labs: Lab Results  Component Value Date   HGBA1C 5.7 12/18/2022   HGBA1C 6.4  08/19/2022   HGBA1C 6.1 08/22/2020   ESRSEDRATE 11 11/26/2021   LABURIC 5.2 11/26/2021     Lab Results  Component Value Date   ALBUMIN 4.0 05/18/2019    No results found for: "MG" No results found for: "VD25OH"  No results found for: "PREALBUMIN"    Latest Ref Rng & Units 11/02/2020    8:05 AM 05/21/2019    2:10 AM 05/18/2019   10:02 AM  CBC EXTENDED  WBC 4.0 - 10.5 K/uL  11.4  10.5   RBC 3.87 - 5.11 MIL/uL  3.35  4.78   Hemoglobin 12.0 - 15.0 g/dL 14.6  9.3  13.3   HCT 36.0 - 46.0 % 43.0  28.1  41.2   Platelets 150 - 400 K/uL  284  356      There is no height or weight on file to calculate BMI.  Orders:  Orders Placed This Encounter  Procedures   Ambulatory referral to Physical Medicine Rehab   No orders of the defined types were placed in this encounter.    Procedures: No procedures performed  Clinical Data: No additional findings.  ROS:  All other systems negative, except as noted in the HPI. Review of Systems  Objective: Vital Signs: LMP  (LMP Unknown)   Specialty Comments:  EXAM: MRI LUMBAR SPINE WITHOUT CONTRAST   TECHNIQUE:  Multiplanar, multisequence MR imaging of the lumbar spine was performed. No intravenous contrast was administered.   COMPARISON:  Lumbar radiographs 09/28/2018   FINDINGS: Segmentation:  Normal   Alignment:  7 mm anterolisthesis L4-5.  Remaining alignment normal   Vertebrae: Negative for fracture or mass. Bone marrow edema at L4-5 related to discogenic change.   Conus medullaris and cauda equina: Conus extends to the L1-2 level. Conus and cauda equina appear normal.   Paraspinal and other soft tissues: Negative for paraspinous mass or fluid collection   Disc levels:   L1-2: Negative   L2-3: Negative   L3-4: Mild facet degeneration. Negative for disc protrusion or stenosis   L4-5: 7 mm anterolisthesis. Severe facet degeneration bilaterally. Severe disc degeneration with endplate erosive change and  edema. Severe spinal stenosis. Severe subarticular foraminal stenosis left greater than right   L5-S1: Negative   IMPRESSION: Severe spinal stenosis L4-5 with severe subarticular foraminal stenosis bilaterally left greater than right. Grade 1 anterolisthesis L4-5. Remaining lumbar spine negative     Electronically Signed   By: Franchot Gallo M.D.   On: 11/13/2018 15:34  PMFS History: Patient Active Problem List   Diagnosis Date Noted   Vaginal irritation 02/21/2023   Skin lesion of back 02/21/2023   Tinea 12/18/2022   Left shoulder pain 08/25/2022   DJD (degenerative joint disease), multiple sites 08/06/2022   COPD, mild ([place]) 02/11/2022   Carpal tunnel syndrome, bilateral 12/21/2021   Arthritis of finger of left hand 11/26/2021   Traumatic arthritis of left foot    Scalp lesion 06/04/2020   Arthritis of midfoot 03/14/2020   History of lumbar fusion 03/14/2020   Well controlled diabetes mellitus ([place]) 11/07/2019   Nummular eczema 07/06/2019   Lumbar stenosis 05/20/2019   Acute left-sided low back pain without sciatica 07/18/2018   Osteoarthritis of right hip 06/03/2018   Chronic right shoulder pain 05/05/2018   Essential hypertension 02/15/2018   Past Medical History:  Diagnosis Date   Arthritis    right knee, lower back   Chest wall pain 04/20/2021   Dyspnea    very rare -tx with albuterol neb sol if needed   Hyperlipidemia    Hypertension    Intertrigo 08/06/2022   Pre-diabetes    per patient - does not check blood sugar   Right hip pain 05/05/2018   Rupture of anterior cruciate ligament of right knee 02/15/2018   Sprain of medial collateral ligament of right knee 02/15/2018   SVD (spontaneous vaginal delivery)    x 5 - only 2 living, 1 stillborn and 1 premature at 7 months demise,1 child deceased   Wears dentures    upper and lower    Family History  Problem Relation Age of Onset   Cancer Sister    Healthy Daughter    Healthy Daughter     Past Surgical  History:  Procedure Laterality Date   FOOT ARTHRODESIS Left 11/02/2020   Procedure: LEFT MIDFOOT FUSION BASE 1ST AND 2ND METATARSAL;  Surgeon: Newt Minion, MD;  Location: [facility];  Service: Orthopedics;  Laterality: Left;   LUMBAR FUSION  05/20/2019   GILL PROCEDURE, LEFT TRANSFORAMINAL LUMBAR INTERBODY FUSION, PEDICLE INSTRUMENTATION, BILATERAL FUSION (N/A   TUBAL LIGATION     Social History   Occupational History   Not on file  Tobacco Use   Smoking status: Former    Packs/day: 1.00    Years: 30.00    Total pack years: 30.00    Types: Cigarettes  Quit date: 01/2015    Years since quitting: 8.1    Passive exposure: Past   Smokeless tobacco: Never  Vaping Use   Vaping Use: Never used  Substance and Sexual Activity   Alcohol use: Not Currently   Drug use: Not Currently    Types: Marijuana    Comment: stopped 12/15/2021   Sexual activity: Not on file

## 2023-02-26 ENCOUNTER — Ambulatory Visit (INDEPENDENT_AMBULATORY_CARE_PROVIDER_SITE_OTHER): Payer: Medicaid Other | Admitting: Family Medicine

## 2023-02-26 VITALS — BP 134/75 | HR 94 | Ht 66.0 in | Wt 188.8 lb

## 2023-02-26 DIAGNOSIS — L280 Lichen simplex chronicus: Secondary | ICD-10-CM

## 2023-02-26 NOTE — Assessment & Plan Note (Addendum)
Chronic pruritic R scalp lesion. 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 Has tried applying triamcinolone (which was originally prescribed for back rash) on her scalp with minimal benefit. Hair is starting to grow back. - Recommend continuing triamcinolone topical BID for next 3 months - Discussed avoiding scratching if possible. Suspect that chronic scratching is further aggravating the skin condition.

## 2023-02-26 NOTE — Patient Instructions (Signed)
Good to see you today - Thank you for coming in  Things we discussed today:  1) For your scalp rash, I recommend applying your triamcinolone ointment twice a day. It can take 2-3 months for your rash to improve. - AVOID scratching it as much as possible. If you do scratch, try to only rub it instead of scratching it too hard. Scratching can cause worsening irritation and make the itching and hair loss worse.

## 2023-02-26 NOTE — Progress Notes (Signed)
    SUBJECTIVE:   CHIEF COMPLAINT / HPI:   CB is a 62yo F p/f R scalp rash. This has been a chronic issue. She has tried ketakanozole shampoo with no improvement. She has also tried applying Triamcinolone (prescribed separately by PCP for back rash, but she tried it on her scalp), reports no improvement yet. She has tried a new hair oil and feels like her hair is growing back.  PERTINENT  PMH / PSH: chronic scalp rash  OBJECTIVE:   BP 134/75   Pulse 94   Ht 5\' 6"  (1.676 m)   Wt 188 lb 12.8 oz (85.6 kg)   LMP  (LMP Unknown)   SpO2 99%   BMI 30.47 kg/m   Gen: Well appearing, friendly, alert. NAD. Resp: Norma WOB on RA. Derm: Hyperkertatotic, hyperpigmented pruritic lesion on R scalp with underlying edema; nontender, nonerythematous.   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 Has tried applying triamcinolone (which was originally prescribed for back rash) on her scalp with minimal benefit. Hair is starting to grow back. - Recommend continuing triamcinolone topical BID for next 3 months - Discussed avoiding scratching if possible. Suspect that chronic scratching is further aggravating the skin condition.     Arlyce Dice, MD [place]

## 2023-02-27 ENCOUNTER — Other Ambulatory Visit: Payer: Medicaid Other | Admitting: *Deleted

## 2023-02-27 NOTE — Patient Outreach (Signed)
  Medicaid Managed Care   Unsuccessful Attempt Note   02/27/2023 Name: Sandra Lozano MRN: [id number] DOB: 1961-03-02  Referred by: [name], MD Reason for referral : High Risk Managed Medicaid (Unsuccessful RNCM outreach)   Third unsuccessful telephone outreach was attempted today. The patient was referred to the case management team for assistance with care management and care coordination. The patient's primary care provider has been notified of our unsuccessful attempts to make or maintain contact with the patient. The care management team is pleased to engage with this patient at any time in the future should he/she be interested in assistance from the care management team.    Follow Up Plan: The  Patient has been provided with contact information for the Managed Medicaid care management team and has been advised to call with any health related questions or concerns.    Lurena Joiner RN, BSN [place]  [employer] manager

## 2023-03-03 ENCOUNTER — Other Ambulatory Visit: Payer: Self-pay | Admitting: Family Medicine

## 2023-03-03 ENCOUNTER — Telehealth: Payer: Self-pay | Admitting: Physical Medicine and Rehabilitation

## 2023-03-03 DIAGNOSIS — E119 Type 2 diabetes mellitus without complications: Secondary | ICD-10-CM

## 2023-03-03 NOTE — Telephone Encounter (Signed)
Spoke with patient and scheduled OV for 03/10/23.

## 2023-03-03 NOTE — Telephone Encounter (Signed)
See previous encounter

## 2023-03-03 NOTE — Telephone Encounter (Signed)
Patient states she missed a call.

## 2023-03-03 NOTE — Telephone Encounter (Signed)
Waiting on call for an appointment.. 

## 2023-03-05 ENCOUNTER — Encounter (HOSPITAL_COMMUNITY): Payer: Self-pay

## 2023-03-05 ENCOUNTER — Emergency Department (HOSPITAL_COMMUNITY)
Admission: EM | Admit: 2023-03-05 | Discharge: 2023-03-05 | Disposition: A | Discharge status: Home / Self Care | Payer: Medicaid Other | Attending: Emergency Medicine | Admitting: Emergency Medicine

## 2023-03-05 ENCOUNTER — Other Ambulatory Visit: Payer: Self-pay

## 2023-03-05 ENCOUNTER — Emergency Department (HOSPITAL_COMMUNITY): Payer: Medicaid Other

## 2023-03-05 DIAGNOSIS — I10 Essential (primary) hypertension: Secondary | ICD-10-CM | POA: Diagnosis not present

## 2023-03-05 DIAGNOSIS — R0682 Tachypnea, not elsewhere classified: Secondary | ICD-10-CM | POA: Insufficient documentation

## 2023-03-05 DIAGNOSIS — R059 Cough, unspecified: Secondary | ICD-10-CM | POA: Diagnosis present

## 2023-03-05 DIAGNOSIS — Z7984 Long term (current) use of oral hypoglycemic drugs: Secondary | ICD-10-CM | POA: Diagnosis not present

## 2023-03-05 DIAGNOSIS — E119 Type 2 diabetes mellitus without complications: Secondary | ICD-10-CM | POA: Diagnosis not present

## 2023-03-05 DIAGNOSIS — Z79899 Other long term (current) drug therapy: Secondary | ICD-10-CM | POA: Insufficient documentation

## 2023-03-05 DIAGNOSIS — Z7982 Long term (current) use of aspirin: Secondary | ICD-10-CM | POA: Insufficient documentation

## 2023-03-05 DIAGNOSIS — B349 Viral infection, unspecified: Secondary | ICD-10-CM | POA: Diagnosis not present

## 2023-03-05 DIAGNOSIS — R0602 Shortness of breath: Secondary | ICD-10-CM | POA: Diagnosis not present

## 2023-03-05 NOTE — ED Triage Notes (Signed)
Pt reports chills and generalized body aches since Tuesday. Pt tested positive for COVID on a home test

## 2023-03-05 NOTE — Discharge Instructions (Addendum)
You were seen in the emergency department today for likely having [place].  Your chest x-ray does not demonstrate any pneumonia.  Please drink plenty of fluids, rest.  Please return to the emergency department if you have worsening shortness of breath or difficulty breathing.

## 2023-03-05 NOTE — ED Provider Notes (Signed)
[facility] Provider Note   CSN: [id number] Arrival date & time: 03/05/23  1015     History  Chief Complaint  Patient presents with   Chills   Generalized Body Aches    BRANDILYNN STOCKBURGER is a 62 y.o. female.  With past medical history of hypertension, diabetes who presents to the emergency department with bodyaches and cough.  Patient states that she was around a coworker last Thursday who was diagnosed with COVID-19.  She states that beginning around Saturday or Sunday she started to have chills and bodyaches.  She states that she then developed a dry cough, congestion.  She has had ongoing myalgias and feels mildly short of breath.  Her husband also has similar symptoms.  She states that she overall feels okay, however her husband wanted her to come to be evaluated while he is also being evaluated here in the emergency department.  She denies chest pain, palpitations, abdominal pain, nausea, vomiting, diarrhea.  She has good p.o. intake at home.  HPI     Home Medications Prior to Admission medications   Medication Sig Start Date End Date Taking? Authorizing Provider  Accu-Chek Softclix Lancets lancets Use to check blood sugar once a day 12/01/22   [name], MD  Acetaminophen (TYLENOL 8 HOUR ARTHRITIS PAIN PO) Take by mouth.    [provider]  aspirin EC 325 MG tablet Take 650 mg by mouth daily. Takes every morning    [provider]  atorvastatin (LIPITOR) 20 MG tablet Take 1 tablet (20 mg total) by mouth every evening. 01/02/23   [name], MD  Blood Glucose Monitoring Suppl w/Device KIT 1 kit by Does not apply route daily. 09/04/21   Tytus Strahle-Lott, Naaman Plummer, DO  Blood Pressure Monitoring (BLOOD PRESSURE CUFF) MISC Check your blood pressure in the morning when you wake up, and at night before you go to bed 03/11/22   Shaylie Eklund-Lott, Naaman Plummer, DO  cetirizine (ZYRTEC) 10 MG tablet Take 1 tablet (10 mg total) by mouth daily.  04/30/22   Jill Stopka-Lott, Naaman Plummer, DO  diazepam (VALIUM) 5 MG tablet Take one tablet by mouth with food one hour prior to procedure. May repeat 30 minutes prior if needed. 10/01/22   Lorine Bears, NP  diclofenac Sodium (VOLTAREN) 1 % GEL APPLY 4 GRAMS TOPICALLY 4 (FOUR) TIMES DAILY. 12/01/22   [name], MD  diphenhydrAMINE-Zinc Acetate (BENADRYL ITCH RELIEF STICK) 2-0.1 % STCK Apply to itchy spot on back twice daily as needed 02/18/23   [name], MD  fluticasone (FLONASE) 50 MCG/ACT nasal spray Place 2 sprays into both nostrils daily. 04/30/22   Jovaun Levene-Lott, Naaman Plummer, DO  gabapentin (NEURONTIN) 600 MG tablet Take 1 tablet (600 mg total) by mouth at bedtime. 01/02/23   [name], MD  glucose blood (ACCU-CHEK AVIVA PLUS) test strip Use as instructed 12/01/22   [name], MD  glucose blood test strip Please use to check blood sugar once daily. 12/01/22   [name], MD  ketoconazole (NIZORAL) 2 % shampoo Apply 1 Application topically 2 (two) times a week. 10/09/22   [name], MD  Ketoconazole 2 % GEL Apply 1 Application topically daily. 12/18/22   [name], MD  lisinopril-hydrochlorothiazide (ZESTORETIC) 20-12.5 MG tablet Take 2 tablets by mouth daily. 01/06/23   [name], MD  meloxicam (MOBIC) 15 MG tablet Take 1 tablet (15 mg total) by mouth daily. 01/22/23   [name], MD  metFORMIN (GLUCOPHAGE) 500 MG tablet Take 1 tablet (  500 mg total) by mouth daily with breakfast. 01/02/23   [name], MD  metroNIDAZOLE (FLAGYL) 500 MG tablet Take 1 tablet (500 mg total) by mouth 3 (three) times daily. 02/18/23   [name], MD  Omega-3 Fatty Acids (FISH OIL) 1000 MG CAPS Take 1 capsule (1,000 mg total) by mouth at bedtime. 01/29/22   Dayrin Stallone-Lott, Simone, DO  OZEMPIC, 2 MG/DOSE, 8 MG/3ML SOPN INJECT 2 MG INTO THE SKIN ONCE A WEEK. 03/03/23   [name], MD  triamcinolone ointment (KENALOG) 0.5 % Apply 1 Application topically 2 (two) times daily. 02/18/23   [name],  MD      Allergies    Patient has no known allergies.    Review of Systems   Review of Systems  Constitutional:  Positive for chills and fatigue.  Respiratory:  Positive for cough and shortness of breath.   Musculoskeletal:  Positive for myalgias.  All other systems reviewed and are negative.   Physical Exam Updated Vital Signs BP (!) 167/91 (BP Location: Right Arm)   Pulse 95   Temp 98.4 F (36.9 C) (Oral)   Resp 18   LMP  (LMP Unknown)   SpO2 99%  Physical Exam Vitals and nursing note reviewed.  Constitutional:      General: She is not in acute distress.    Appearance: Normal appearance. She is not ill-appearing or toxic-appearing.  HENT:     Head: Normocephalic.     Mouth/Throat:     Mouth: Mucous membranes are moist.     Pharynx: Oropharynx is clear.  Eyes:     General: No scleral icterus.    Extraocular Movements: Extraocular movements intact.  Cardiovascular:     Rate and Rhythm: Normal rate and regular rhythm.     Pulses: Normal pulses.     Heart sounds: No murmur heard. Pulmonary:     Effort: Tachypnea present. No respiratory distress.     Breath sounds: Normal breath sounds.     Comments: She is very mildly tachypneic intermittently, however lung sounds are clear bilaterally.  There is no respiratory distress or hypoxia. Abdominal:     General: Bowel sounds are normal.     Palpations: Abdomen is soft.  Skin:    General: Skin is warm and dry.     Capillary Refill: Capillary refill takes less than 2 seconds.  Neurological:     General: No focal deficit present.     Mental Status: She is alert and oriented to person, place, and time. Mental status is at baseline.  Psychiatric:        Mood and Affect: Mood normal.        Behavior: Behavior normal.        Thought Content: Thought content normal.        Judgment: Judgment normal.     ED Results / Procedures / Treatments   Labs (all labs ordered are listed, but only abnormal results are displayed) Labs  Reviewed - No data to display  EKG EKG Interpretation  Date/Time:  Thursday March 05 2023 11:15:32 EDT Ventricular Rate:  77 PR Interval:  141 QRS Duration: 92 QT Interval:  383 QTC Calculation: 434 R Axis:   11 Text Interpretation: Sinus rhythm Low voltage, precordial leads No significant change was found Confirmed by Ezequiel Essex [phone]) on 03/05/2023 11:21:12 AM  Radiology DG Chest 2 View  Result Date: 03/05/2023 CLINICAL DATA:  Shortness of breath and congestion. Positive home COVID test 2 days ago. EXAM: CHEST - 2  VIEW COMPARISON:  CT chest 10/31/2022 FINDINGS: Cardiac silhouette and mediastinal contours are within normal limits. Moderate atherosclerotic calcifications within the aortic arch. The lungs are clear. No pleural effusion or pneumothorax. Moderate predominantly anterior mid and lower thoracic spine disc space narrowing, endplate sclerosis, and endplate osteophytosis. IMPRESSION: No active cardiopulmonary disease. Electronically Signed   By: Yvonne Kendall M.D.   On: 03/05/2023 12:01    Procedures Procedures   Medications Ordered in ED Medications - No data to display  ED Course/ Medical Decision Making/ A&P    Medical Decision Making Amount and/or Complexity of Data Reviewed Radiology: ordered.  Initial Impression and Ddx 62 year old female who presents to the emergency department with cough, viral symptoms, mild shortness of breath. Patient PMH that increases complexity of ED encounter: Hypertension, diabetes Pharyngeal: Viral upper respiratory illness, bacterial pneumonia, PE, pneumothorax, etc.  Interpretation of Diagnostics I independent reviewed and interpreted the labs as followed: Not indicated  - I independently visualized the following imaging with scope of interpretation limited to determining acute life threatening conditions related to emergency care: Chest x-ray, which revealed no acute findings  Patient Reassessment and Ultimate  Disposition/Management This is a well-appearing, nonseptic and nontoxic 62 year old female who presents with cough, mild shortness of breath.  She is overall in no acute distress, oxygenating well on room air.  She has no fever.  She does have cough, congestion, symptoms of viral upper respiratory illness after exposure to COVID-19 last week.  Given her mild shortness of breath symptoms we obtained a chest x-ray which did not demonstrate any pneumonia.  Her EKG appears normal as well.  Opted to not viral test given that she had exposure to COVID-19 and symptoms shortly after.  Suppose this could also be flu or other viral illness.  I have very low suspicion for bacterial infection requiring antibiotics.  She has been taking Tylenol, Motrin, over-the-counter cough and cold medications with improvement in her symptoms.  I do not feel that she needs further laboratory workup or imaging at this time.  Considered and highly doubt pulmonary embolism and will not pursue D-dimer or CTA PE study.  I have given her return precautions should she have worsening symptoms.  She verbalized understanding.  Otherwise feel that she is safe for discharge at this time.  The patient has been appropriately medically screened and/or stabilized in the ED. I have low suspicion for any other emergent medical condition which would require further screening, evaluation or treatment in the ED or require inpatient management. At time of discharge the patient is hemodynamically stable and in no acute distress. I have discussed work-up results and diagnosis with patient and answered all questions. Patient is agreeable with discharge plan. We discussed strict return precautions for returning to the emergency department and they verbalized understanding.     Patient management required discussion with the following services or consulting groups:  None  Complexity of Problems Addressed Acute complicated illness or Injury  Additional Data  Reviewed and Analyzed Further history obtained from: Further history from spouse/family member, Past medical history and medications listed in the EMR, and Care Everywhere  Patient Encounter Risk Assessment None  Final Clinical Impression(s) / ED Diagnoses Final diagnoses:  Viral illness    Rx / DC Orders ED Discharge Orders     None         Mickie Hillier, PA-C 03/05/23 1255    Ezequiel Essex, MD 03/05/23 1650

## 2023-03-06 ENCOUNTER — Other Ambulatory Visit: Payer: Medicaid Other

## 2023-03-06 ENCOUNTER — Encounter: Payer: Self-pay | Admitting: Orthopedic Surgery

## 2023-03-06 NOTE — Patient Outreach (Signed)
  Medicaid Managed Care   Unsuccessful Outreach Note  03/06/2023 Name: Sandra Lozano MRN: [id number] DOB: [birth year]  Referred by: [name], MD Reason for referral : High Risk Managed Medicaid (MM Social ork unsuccessful telephone outreach )   An unsuccessful telephone outreach was attempted today. The patient was referred to the case management team for assistance with care management and care coordination.   Follow Up Plan: A HIPAA compliant phone message was left for the patient providing contact information and requesting a return call.  Mickel Fuchs, BSW, [place] Managed Medicaid Team  [phone]

## 2023-03-06 NOTE — Patient Instructions (Signed)
  Medicaid Managed Care   Unsuccessful Outreach Note  03/06/2023 Name: Sandra Lozano MRN: [id number] DOB: 15-Mar-1961  Referred by: [name], MD Reason for referral : High Risk Managed Medicaid (MM Social ork unsuccessful telephone outreach )   An unsuccessful telephone outreach was attempted today. The patient was referred to the case management team for assistance with care management and care coordination.   Follow Up Plan: A HIPAA compliant phone message was left for the patient providing contact information and requesting a return call.  Mickel Fuchs, BSW, [place] Managed Medicaid Team  [phone]

## 2023-03-10 ENCOUNTER — Encounter: Payer: Self-pay | Admitting: Physical Medicine and Rehabilitation

## 2023-03-10 ENCOUNTER — Ambulatory Visit: Payer: Medicaid Other | Admitting: Physical Medicine and Rehabilitation

## 2023-03-10 DIAGNOSIS — M5441 Lumbago with sciatica, right side: Secondary | ICD-10-CM | POA: Diagnosis not present

## 2023-03-10 DIAGNOSIS — M5416 Radiculopathy, lumbar region: Secondary | ICD-10-CM | POA: Diagnosis not present

## 2023-03-10 DIAGNOSIS — G8929 Other chronic pain: Secondary | ICD-10-CM | POA: Diagnosis not present

## 2023-03-10 DIAGNOSIS — M961 Postlaminectomy syndrome, not elsewhere classified: Secondary | ICD-10-CM | POA: Diagnosis not present

## 2023-03-10 NOTE — Progress Notes (Unsigned)
Sandra Lozano - 62 y.o. female MRN [id number]  Date of birth: Jan 14, 1961  Office Visit Note: Visit Date: 03/10/2023 PCP: [name], MD Referred by: [name], MD  Subjective: Chief Complaint  Patient presents with   Lower Back - Pain   HPI: Sandra Lozano is a 62 y.o. female who comes in today for evaluation of chronic, worsening and severe right sided lower back radiating to buttock and lateral hip. Pain ongoing for several years, worsened several weeks ago. Pain worsens with walking. She describes pain as sore, burning and aching, currently rates as 8 out of 10. Some relief of pain with home exercise regimen, rest and use of medications. Continues with Meloxicam and Tylenol daily. States she does take [facility] Powder intermittently. Lumbar MRI imaging from 2019 exhibits 7 mm anterolisthesis, severe facet arthrosis and severe spinal canal stenosis at L4-L5. Patient did undergo L4-L5 lumbar fusion by Dr. Rodell Perna in June 2020. Lumbar x-rays from 2023 exhibit stable alignment of lumbar fusion hardware, no loosening. Patient underwent Right L5 transforaminal epidural steroid injection in our office on 11/12/2022, she reports greater than 80% relief of pain and increased functional ability for greater than 3 months. She reports significant relief of pain with injections, states she was able to walk without excruciating pain. She was able to return to regular exercise 2-3 days a week post injection. 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 She denies focal weakness, numbness and tingling. No recent trauma or falls.     Review of Systems  Musculoskeletal:  Positive for back pain.  Neurological:  Negative for tingling, sensory change, focal weakness and weakness.  All other systems reviewed and are negative.  Otherwise per HPI.  Assessment & Plan: Visit Diagnoses:    ICD-10-CM   1. Chronic right-sided low back pain with right-sided sciatica  M54.41    G89.29     2.  Lumbar radiculopathy  M54.16 Ambulatory referral to Physical Medicine Rehab    3. Post laminectomy syndrome  M96.1 Ambulatory referral to Physical Medicine Rehab       Plan: Findings:  Chronic, worsening and severe right sided lower back radiating to buttock and lateral hip.  Patient continues to have severe pain despite good conservative therapy such as home exercise regimen, rest and use of medications.  Patient's clinical presentation and exam are consistent with L5 nerve pattern.  Next step is to perform diagnostic and hopefully therapeutic right L5 transforaminal epidural steroid injection under fluoroscopic guidance. If good relief of pain with injection we can repeat this procedure infrequently as needed. Patient voiced that she did well with previous injection and does not require pre-procedure sedation. I am happy to prescribe Valium if she changes her mind. If her pain persists post injection we would be quick to obtain new lumbar MRI imaging to assess for adjacent level disease. We also discussed re-grouping with Dr. Lorin Mercy to discuss options. No red flag symptoms noted upon exam today.      Meds & Orders: No orders of the defined types were placed in this encounter.   Orders Placed This Encounter  Procedures   Ambulatory referral to Physical Medicine Rehab    Follow-up: Return for Right L5 transforaminal epidural steroid injection.   Procedures: No procedures performed      Clinical History: EXAM: MRI LUMBAR SPINE WITHOUT CONTRAST   TECHNIQUE: Multiplanar, multisequence MR imaging of the lumbar spine was performed. No intravenous contrast was administered.   COMPARISON:  Lumbar radiographs  09/28/2018   FINDINGS: Segmentation:  Normal   Alignment:  7 mm anterolisthesis L4-5.  Remaining alignment normal   Vertebrae: Negative for fracture or mass. Bone marrow edema at L4-5 related to discogenic change.   Conus medullaris and cauda equina: Conus extends to the L1-2  level. Conus and cauda equina appear normal.   Paraspinal and other soft tissues: Negative for paraspinous mass or fluid collection   Disc levels:   L1-2: Negative   L2-3: Negative   L3-4: Mild facet degeneration. Negative for disc protrusion or stenosis   L4-5: 7 mm anterolisthesis. Severe facet degeneration bilaterally. Severe disc degeneration with endplate erosive change and edema. Severe spinal stenosis. Severe subarticular foraminal stenosis left greater than right   L5-S1: Negative   IMPRESSION: Severe spinal stenosis L4-5 with severe subarticular foraminal stenosis bilaterally left greater than right. Grade 1 anterolisthesis L4-5. Remaining lumbar spine negative     Electronically Signed   By: Franchot Gallo M.D.   On: 11/13/2018 15:34   She reports that she quit smoking about 8 years ago. Her smoking use included cigarettes. She has a 30.00 pack-year smoking history. She has been exposed to tobacco smoke. She has never used smokeless tobacco.  Recent Labs    08/19/22 1555 12/18/22 0850  HGBA1C 6.4 5.7    Objective:  VS:  HT:    WT:   BMI:     BP:   HR: bpm  TEMP: ( )  RESP:  Physical Exam Vitals and nursing note reviewed.  HENT:     Head: Normocephalic and atraumatic.     Right Ear: External ear normal.     Left Ear: External ear normal.     Nose: Nose normal.     Mouth/Throat:     Mouth: Mucous membranes are moist.  Eyes:     Extraocular Movements: Extraocular movements intact.  Cardiovascular:     Rate and Rhythm: Normal rate.     Pulses: Normal pulses.  Pulmonary:     Effort: Pulmonary effort is normal.  Abdominal:     General: Abdomen is flat. There is no distension.  Musculoskeletal:        General: Tenderness present.     Cervical back: Normal range of motion.     Comments: Patient rises from seated position to standing without difficulty. Good lumbar range of motion. No pain noted with facet loading. 5/5 strength noted with bilateral  hip flexion, knee flexion/extension, ankle dorsiflexion/plantarflexion and EHL. No clonus noted bilaterally. No pain upon palpation of greater trochanters. No pain with internal/external rotation of bilateral hips. Sensation intact bilaterally. Dysesthesias noted to right L5 dermatome. Ambulates without aid, gait steady.     Skin:    General: Skin is warm and dry.     Capillary Refill: Capillary refill takes less than 2 seconds.  Neurological:     General: No focal deficit present.     Mental Status: She is alert and oriented to person, place, and time.  Psychiatric:        Mood and Affect: Mood normal.        Behavior: Behavior normal.     Ortho Exam  Imaging: No results found.  Past Medical/Family/Surgical/Social History: Medications & Allergies reviewed per EMR, new medications updated. 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   Lumbar stenosis 05/20/2019   Acute left-sided low back pain without sciatica 07/18/2018   Osteoarthritis of right hip 06/03/2018   Chronic right shoulder pain 05/05/2018   Essential hypertension 02/15/2018   Past Medical History:  Diagnosis Date   Arthritis    right knee, lower back   Chest wall pain 04/20/2021   Dyspnea    very rare -tx with albuterol neb sol if needed   Hyperlipidemia    Hypertension    Intertrigo 08/06/2022   Pre-diabetes    per patient - does not check blood sugar   Right hip pain  05/05/2018   Rupture of anterior cruciate ligament of right knee 02/15/2018   Sprain of medial collateral ligament of right knee 02/15/2018   SVD (spontaneous vaginal delivery)    x 5 - only 2 living, 1 stillborn and 1 premature at 7 months demise,1 child deceased   Wears dentures    upper and lower   Family History  Problem Relation Age of Onset   Cancer Sister    Healthy Daughter    Healthy Daughter    Past Surgical History:  Procedure Laterality Date   FOOT ARTHRODESIS Left 11/02/2020   Procedure: LEFT MIDFOOT FUSION BASE 1ST AND 2ND METATARSAL;  Surgeon: Newt Minion, MD;  Location: [place];  Service: Orthopedics;  Laterality: Left;   LUMBAR FUSION  05/20/2019   GILL PROCEDURE, LEFT TRANSFORAMINAL LUMBAR INTERBODY FUSION, PEDICLE INSTRUMENTATION, BILATERAL FUSION (N/A   TUBAL LIGATION     Social History   Occupational History   Not on file  Tobacco Use   Smoking status: Former    Packs/day: 1.00    Years: 30.00    Additional pack years: 0.00    Total pack years: 30.00    Types: Cigarettes    Quit date: 01/2015    Years since quitting: 8.1    Passive exposure: Past   Smokeless tobacco: Never  Vaping Use   Vaping Use: Never used  Substance and Sexual Activity   Alcohol use: Not Currently   Drug use: Not Currently    Types: Marijuana    Comment: stopped 12/15/2021   Sexual activity: Not on file

## 2023-03-10 NOTE — Progress Notes (Unsigned)
Functional Pain Scale - descriptive words and definitions  Distracting (5)    Aware of pain/able to complete some ADL's but limited by pain/sleep is affected and active distractions are only slightly useful. Moderate range order  Average Pain 8  Lower back pain. Injection worked greater than 70%. Pain only when walking

## 2023-03-16 DIAGNOSIS — Z419 Encounter for procedure for purposes other than remedying health state, unspecified: Secondary | ICD-10-CM | POA: Diagnosis not present

## 2023-03-18 ENCOUNTER — Encounter: Payer: Self-pay | Admitting: Orthopedic Surgery

## 2023-03-23 ENCOUNTER — Encounter (HOSPITAL_COMMUNITY): Payer: Self-pay | Admitting: Orthopedic Surgery

## 2023-03-23 ENCOUNTER — Other Ambulatory Visit: Payer: Self-pay

## 2023-03-23 DIAGNOSIS — G8929 Other chronic pain: Secondary | ICD-10-CM

## 2023-03-23 NOTE — Progress Notes (Signed)
PCP - Lockie Mola, MD  Chest x-ray - 03/05/23 EKG - 03/05/23  Fasting Blood Sugar - 80-100 Checks Blood Sugar 1/day  OZEMPIC last dose: 03/18/23  Anesthesia review: N  Patient verbally denies any shortness of breath, fever, cough and chest pain during phone call   -------------  SDW INSTRUCTIONS given:  Your procedure is scheduled on Wednesday, 03/25/23.  Report to [facility] Main Entrance "A" at 0630 A.M., and check in at the Admitting office.  Call this number if you have problems the morning of surgery:  [phone]   Remember:  Do not eat after midnight the night before your surgery  You may drink clear liquids until 0530 the morning of your surgery.   Clear liquids allowed are: Water, Non-Citrus Juices (without pulp), Carbonated Beverages, Clear Tea, Black Coffee Only, and Gatorade    Take these medicines the morning of surgery with A SIP OF WATER  RESTASIS  acetaminophen (TYLENOL)-if needed cetirizine (ZYRTEC)-if needed fluticasone (FLONASE)-if needed  **DO NOT take METFORMIN on the day of surgery**  As of today, STOP taking any Aspirin (unless otherwise instructed by your surgeon) diclofenac Sodium (VOLTAREN), Omega-3 Fatty Acids (FISH OIL), Aleve, Naproxen, Ibuprofen, Motrin, Advil, Goody's, BC's, all herbal medications, fish oil, and all vitamins.                      Do not wear jewelry, make up, or nail polish            Do not wear lotions, powders, perfumes/colognes, or deodorant.            Do not shave 48 hours prior to surgery.  Men may shave face and neck.            Do not bring valuables to the hospital.            [facility] is not responsible for any belongings or valuables.  Do NOT Smoke (Tobacco/Vaping) 24 hours prior to your procedure If you use a CPAP at night, you may bring all equipment for your overnight stay.   Contacts, glasses, dentures or bridgework may not be worn into surgery.      For patients admitted to the hospital, discharge  time will be determined by your treatment team.   Patients discharged the day of surgery will not be allowed to drive home, and someone needs to stay with them for 24 hours.    Special instructions:   [place]- Preparing For Surgery  Before surgery, you can play an important role. Because skin is not sterile, your skin needs to be as free of germs as possible. You can reduce the number of germs on your skin by washing with CHG (chlorahexidine gluconate) Soap before surgery.  CHG is an antiseptic cleaner which kills germs and bonds with the skin to continue killing germs even after washing.    Oral Hygiene is also important to reduce your risk of infection.  Remember - BRUSH YOUR TEETH THE MORNING OF SURGERY WITH YOUR REGULAR TOOTHPASTE  Please do not use if you have an allergy to CHG or antibacterial soaps. If your skin becomes reddened/irritated stop using the CHG.  Do not shave (including legs and underarms) for at least 48 hours prior to first CHG shower. It is OK to shave your face.  Please follow these instructions carefully.   Shower the NIGHT BEFORE SURGERY and the MORNING OF SURGERY with DIAL Soap.   Pat yourself dry with a CLEAN TOWEL.  Wear CLEAN PAJAMAS to bed the night before surgery  Place CLEAN SHEETS on your bed the night of your first shower and DO NOT SLEEP WITH PETS.   Day of Surgery: Please shower morning of surgery  Wear Clean/Comfortable clothing the morning of surgery Do not apply any deodorants/lotions.   Remember to brush your teeth WITH YOUR REGULAR TOOTHPASTE.   Questions were answered. Patient verbalized understanding of instructions.

## 2023-03-24 ENCOUNTER — Encounter (HOSPITAL_COMMUNITY): Payer: Self-pay | Admitting: Certified Registered Nurse Anesthetist

## 2023-03-25 ENCOUNTER — Encounter: Payer: Self-pay | Admitting: Orthopedic Surgery

## 2023-03-25 MED ORDER — BLUE-EMU MAXIMUM PAIN RELIEF 10 % EX CREA: 1.0000 | TOPICAL_CREAM | Freq: Two times a day (BID) | CUTANEOUS | 1 refills | Status: DC | PRN

## 2023-03-26 ENCOUNTER — Ambulatory Visit (INDEPENDENT_AMBULATORY_CARE_PROVIDER_SITE_OTHER): Payer: Medicaid Other | Admitting: Physical Medicine and Rehabilitation

## 2023-03-26 ENCOUNTER — Other Ambulatory Visit: Payer: Self-pay

## 2023-03-26 VITALS — BP 186/75 | HR 68

## 2023-03-26 DIAGNOSIS — M48062 Spinal stenosis, lumbar region with neurogenic claudication: Secondary | ICD-10-CM

## 2023-03-26 DIAGNOSIS — M5416 Radiculopathy, lumbar region: Secondary | ICD-10-CM

## 2023-03-26 DIAGNOSIS — G894 Chronic pain syndrome: Secondary | ICD-10-CM

## 2023-03-26 MED ORDER — METHYLPREDNISOLONE ACETATE 80 MG/ML IJ SUSP
80.0000 mg | Freq: Once | INTRAMUSCULAR | Status: AC
  Administered 2023-03-26: 80 mg

## 2023-03-26 NOTE — Patient Instructions (Signed)

## 2023-03-26 NOTE — Progress Notes (Signed)
Functional Pain Scale - descriptive words and definitions  Distracting (5)    Aware of pain/able to complete some ADL's but limited by pain/sleep is affected and active distractions are only slightly useful. Moderate range order  Average Pain 7   +Driver, -BT, -Dye Allergies.  Lower back pain on right with radiation in right leg

## 2023-03-27 ENCOUNTER — Ambulatory Visit (HOSPITAL_COMMUNITY): Admission: RE | Admit: 2023-03-27 | Payer: Medicaid Other | Source: Home / Self Care | Admitting: Orthopedic Surgery

## 2023-03-27 SURGERY — RECESSION, MUSCLE, GASTROCNEMIUS
Anesthesia: Choice | Laterality: Left

## 2023-03-28 ENCOUNTER — Other Ambulatory Visit: Payer: Self-pay | Admitting: Family Medicine

## 2023-03-28 DIAGNOSIS — E119 Type 2 diabetes mellitus without complications: Secondary | ICD-10-CM

## 2023-03-30 ENCOUNTER — Ambulatory Visit (INDEPENDENT_AMBULATORY_CARE_PROVIDER_SITE_OTHER): Payer: Medicaid Other | Admitting: Orthopedic Surgery

## 2023-03-30 ENCOUNTER — Other Ambulatory Visit: Payer: Self-pay

## 2023-03-30 DIAGNOSIS — R7303 Prediabetes: Secondary | ICD-10-CM

## 2023-03-30 DIAGNOSIS — M1711 Unilateral primary osteoarthritis, right knee: Secondary | ICD-10-CM | POA: Diagnosis not present

## 2023-03-30 DIAGNOSIS — M25512 Pain in left shoulder: Secondary | ICD-10-CM

## 2023-03-31 ENCOUNTER — Encounter: Payer: Self-pay | Admitting: Orthopedic Surgery

## 2023-03-31 ENCOUNTER — Other Ambulatory Visit: Payer: Self-pay | Admitting: *Deleted

## 2023-03-31 DIAGNOSIS — E119 Type 2 diabetes mellitus without complications: Secondary | ICD-10-CM

## 2023-03-31 DIAGNOSIS — M1711 Unilateral primary osteoarthritis, right knee: Secondary | ICD-10-CM | POA: Diagnosis not present

## 2023-03-31 MED ORDER — METHYLPREDNISOLONE ACETATE 40 MG/ML IJ SUSP
40.0000 mg | INTRAMUSCULAR | Status: AC | PRN
  Administered 2023-03-31: 40 mg via INTRA_ARTICULAR

## 2023-03-31 MED ORDER — LIDOCAINE HCL (PF) 1 % IJ SOLN
5.0000 mL | INTRAMUSCULAR | Status: AC | PRN
  Administered 2023-03-31: 5 mL

## 2023-03-31 NOTE — Progress Notes (Signed)
Office Visit Note   Patient: Sandra Lozano           Date of Birth: 06-07-1961           MRN: [id number] Visit Date: 03/30/2023              Requested by: Lockie Mola, MD [address] [place],  [place] [address] PCP: Lockie Mola, MD  Chief Complaint  Patient presents with   Right Knee - Pain      HPI: Patient is a 62 year old woman with osteoarthritis of the right knee.  Patient is status post hyaluronic acid injections in February without relief.  Patient has pain with weightbearing she uses a cane she is on meloxicam without relief.  She is also used Tylenol without relief.  Assessment & Plan: Visit Diagnoses:  1. Primary osteoarthritis of right knee     Plan: Right knee was injected.  Patient would like to proceed with total knee arthroplasty.  Risks and benefits were discussed including infection neurovascular injury persistent pain need for additional surgery.  Discharge after 24 hours was also discussed.  Patient states she understands would like to proceed with surgery after June.  Follow-Up Instructions: Return if symptoms worsen or fail to improve.   Ortho Exam  Patient is alert, oriented, no adenopathy, well-dressed, normal affect, normal respiratory effort. Examination patient has an antalgic gait she uses a cane.  There is a mild effusion of the right knee there is no redness no cellulitis no open wounds.  She has range of motion from 10 to 110 degrees.  Imaging: No results found. No images are attached to the encounter.  Labs: Lab Results  Component Value Date   HGBA1C 5.7 12/18/2022   HGBA1C 6.4 08/19/2022   HGBA1C 6.1 08/22/2020   ESRSEDRATE 11 11/26/2021   LABURIC 5.2 11/26/2021     Lab Results  Component Value Date   ALBUMIN 4.0 05/18/2019    No results found for: "MG" No results found for: "VD25OH"  No results found for: "PREALBUMIN"    Latest Ref Rng & Units 11/02/2020    8:05 AM 05/21/2019    2:10 AM 05/18/2019   10:02 AM  CBC  EXTENDED  WBC 4.0 - 10.5 K/uL  11.4  10.5   RBC 3.87 - 5.11 MIL/uL  3.35  4.78   Hemoglobin 12.0 - 15.0 g/dL 19.1  9.3  47.8   HCT 29.5 - 46.0 % 43.0  28.1  41.2   Platelets 150 - 400 K/uL  284  356      There is no height or weight on file to calculate BMI.  Orders:  No orders of the defined types were placed in this encounter.  No orders of the defined types were placed in this encounter.    Procedures: Large Joint Inj: R knee on 03/31/2023 10:13 AM Indications: pain and diagnostic evaluation Details: 22 G 1.5 in needle, anteromedial approach  Arthrogram: No  Medications: 5 mL lidocaine (PF) 1 %; 40 mg methylPREDNISolone acetate 40 MG/ML Outcome: tolerated well, no immediate complications Procedure, treatment alternatives, risks and benefits explained, specific risks discussed. Consent was given by the patient. Immediately prior to procedure a time out was called to verify the correct patient, procedure, equipment, support staff and site/side marked as required. Patient was prepped and draped in the usual sterile fashion.      Clinical Data: No additional findings.  ROS:  All other systems negative, except as noted in the HPI. Review  of Systems  Objective: Vital Signs: LMP  (LMP Unknown)   Specialty Comments:  EXAM: MRI LUMBAR SPINE WITHOUT CONTRAST   TECHNIQUE: Multiplanar, multisequence MR imaging of the lumbar spine was performed. No intravenous contrast was administered.   COMPARISON:  Lumbar radiographs 09/28/2018   FINDINGS: Segmentation:  Normal   Alignment:  7 mm anterolisthesis L4-5.  Remaining alignment normal   Vertebrae: Negative for fracture or mass. Bone marrow edema at L4-5 related to discogenic change.   Conus medullaris and cauda equina: Conus extends to the L1-2 level. Conus and cauda equina appear normal.   Paraspinal and other soft tissues: Negative for paraspinous mass or fluid collection   Disc levels:   L1-2: Negative    L2-3: Negative   L3-4: Mild facet degeneration. Negative for disc protrusion or stenosis   L4-5: 7 mm anterolisthesis. Severe facet degeneration bilaterally. Severe disc degeneration with endplate erosive change and edema. Severe spinal stenosis. Severe subarticular foraminal stenosis left greater than right   L5-S1: Negative   IMPRESSION: Severe spinal stenosis L4-5 with severe subarticular foraminal stenosis bilaterally left greater than right. Grade 1 anterolisthesis L4-5. Remaining lumbar spine negative     Electronically Signed   By: Marlan Palau M.D.   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   Arthritis of midfoot 03/14/2020   History of lumbar fusion 03/14/2020   Well controlled diabetes mellitus 11/07/2019   Nummular eczema 07/06/2019   Lumbar stenosis 05/20/2019   Acute left-sided low back pain without sciatica 07/18/2018   Osteoarthritis of right hip 06/03/2018   Chronic right shoulder pain 05/05/2018   Essential hypertension 02/15/2018   Past Medical History:  Diagnosis Date   Arthritis    right knee, lower back   Chest wall pain 04/20/2021   Dyspnea    very rare -tx with albuterol neb sol if needed   Hyperlipidemia    Hypertension    Intertrigo 08/06/2022   Pre-diabetes    per patient - does not check blood sugar   Right hip pain 05/05/2018   Rupture of anterior cruciate ligament of right knee 02/15/2018   Sprain of medial collateral ligament of right knee 02/15/2018   SVD (spontaneous vaginal delivery)    x 5 - only 2 living, 1 stillborn  and 1 premature at 7 months demise,1 child deceased   Wears dentures    upper and lower    Family History  Problem Relation Age of Onset   Cancer Sister    Healthy Daughter    Healthy Daughter     Past Surgical History:  Procedure Laterality Date   FOOT ARTHRODESIS Left 11/02/2020   Procedure: LEFT MIDFOOT FUSION BASE 1ST AND 2ND METATARSAL;  Surgeon: Nadara Mustard, MD;  Location: [facility];  Service: Orthopedics;  Laterality: Left;   LUMBAR FUSION  05/20/2019   GILL PROCEDURE, LEFT TRANSFORAMINAL LUMBAR INTERBODY FUSION, PEDICLE INSTRUMENTATION, BILATERAL FUSION (N/A   TUBAL LIGATION     Social History   Occupational History   Not on file  Tobacco Use   Smoking status: Former  Packs/day: 1.00    Years: 30.00    Additional pack years: 0.00    Total pack years: 30.00    Types: Cigarettes    Quit date: 01/2015    Years since quitting: 8.2    Passive exposure: Past   Smokeless tobacco: Never  Vaping Use   Vaping Use: Never used  Substance and Sexual Activity   Alcohol use: Yes    Comment: occ   Drug use: Not Currently    Types: Marijuana    Comment: stopped 12/15/2022   Sexual activity: Not on file

## 2023-04-01 ENCOUNTER — Encounter: Payer: Medicaid Other | Admitting: Family

## 2023-04-01 NOTE — Telephone Encounter (Signed)
Patient returns call to nurse line to check status of prescription refills.   She states that she is supposed to take Ozempic today and would like to remain on her current schedule.   Forwarding to PCP.   Veronda Prude, RN

## 2023-04-02 ENCOUNTER — Telehealth: Payer: Self-pay

## 2023-04-02 ENCOUNTER — Other Ambulatory Visit: Payer: Self-pay

## 2023-04-02 DIAGNOSIS — M25512 Pain in left shoulder: Secondary | ICD-10-CM

## 2023-04-02 DIAGNOSIS — R7303 Prediabetes: Secondary | ICD-10-CM

## 2023-04-02 DIAGNOSIS — G5601 Carpal tunnel syndrome, right upper limb: Secondary | ICD-10-CM

## 2023-04-02 MED ORDER — ACCU-CHEK AVIVA PLUS VI STRP: ORAL_STRIP | 12 refills | Status: DC

## 2023-04-02 MED ORDER — MELOXICAM 15 MG PO TABS: 15.0000 mg | ORAL_TABLET | Freq: Every day | ORAL | 1 refills | Status: DC

## 2023-04-02 MED ORDER — DICLOFENAC SODIUM 1 % EX GEL: CUTANEOUS | 1 refills | Status: DC

## 2023-04-02 MED ORDER — ACCU-CHEK SOFTCLIX LANCETS MISC: 12 refills | Status: DC

## 2023-04-02 MED ORDER — OZEMPIC (2 MG/DOSE) 8 MG/3ML ~~LOC~~ SOPN: PEN_INJECTOR | SUBCUTANEOUS | 0 refills | Status: DC

## 2023-04-02 MED ORDER — GLUCOSE BLOOD VI STRP: ORAL_STRIP | 6 refills | Status: DC

## 2023-04-02 NOTE — Telephone Encounter (Signed)
Lancets need to be resent in as well.   Voltaren Gel was set to print. Will resend.

## 2023-04-02 NOTE — Addendum Note (Signed)
Addended by: Steva Colder on: 04/02/2023 03:15 PM   Modules accepted: Orders

## 2023-04-02 NOTE — Telephone Encounter (Signed)
Pharmacy calls nurse line requesting an updated prescription for test strips.   Will send in Accu Chek to match meter.

## 2023-04-03 ENCOUNTER — Telehealth: Payer: Self-pay

## 2023-04-03 MED ORDER — ACCU-CHEK GUIDE VI STRP: ORAL_STRIP | 12 refills | Status: DC

## 2023-04-03 NOTE — Telephone Encounter (Signed)
Error

## 2023-04-03 NOTE — Addendum Note (Signed)
Addended by: Veronda Prude on: 04/03/2023 10:21 AM   Modules accepted: Orders

## 2023-04-03 NOTE — Telephone Encounter (Signed)
Patient calls nurse line requesting updated prescription for accu-chek guide test strips.   Previous strips were for accu-chek aviva plus. Resent prescription for accu-chek guide test strips.   Veronda Prude, RN

## 2023-04-06 ENCOUNTER — Ambulatory Visit: Payer: Medicaid Other | Admitting: Orthopedic Surgery

## 2023-04-10 ENCOUNTER — Encounter: Payer: Self-pay | Admitting: Orthopedic Surgery

## 2023-04-10 ENCOUNTER — Encounter: Payer: Self-pay | Admitting: Physical Medicine and Rehabilitation

## 2023-04-10 NOTE — Procedures (Signed)
Lumbosacral Transforaminal Epidural Steroid Injection - Sub-Pedicular Approach with Fluoroscopic Guidance  Patient: Sandra Lozano      Date of Birth: 05-19-1961 MRN: [id number] PCP: Lockie Mola, MD      Visit Date: 03/26/2023   Universal Protocol:    Date/Time: 03/26/2023  Consent Given By: the patient  Position: PRONE  Additional Comments: Vital signs were monitored before and after the procedure. Patient was prepped and draped in the usual sterile fashion. The correct patient, procedure, and site was verified.   Injection Procedure Details:   Procedure diagnoses: Lumbar radiculopathy [M54.16]    Meds Administered:  Meds ordered this encounter  Medications   methylPREDNISolone acetate (DEPO-MEDROL) injection 80 mg    Laterality: Right  Location/Site: L4  Needle:5.0 in., 22 ga.  Short bevel or Quincke spinal needle  Needle Placement: Transforaminal  Findings:    -Comments: Excellent flow of contrast along the nerve, nerve root and into the epidural space.  Procedure Details: After squaring off the end-plates to get a true AP view, the C-arm was positioned so that an oblique view of the foramen as noted above was visualized. The target area is just inferior to the "nose of the scotty dog" or sub pedicular. The soft tissues overlying this structure were infiltrated with 2-3 ml. of 1% Lidocaine without Epinephrine.  The spinal needle was inserted toward the target using a "trajectory" view along the fluoroscope beam.  Under AP and lateral visualization, the needle was advanced so it did not puncture dura and was located close the 6 O'Clock position of the pedical in AP tracterory. Biplanar projections were used to confirm position. Aspiration was confirmed to be negative for CSF and/or blood. A 1-2 ml. volume of Isovue-250 was injected and flow of contrast was noted at each level. Radiographs were obtained for documentation purposes.   After attaining the desired flow of  contrast documented above, a 0.5 to 1.0 ml test dose of 0.25% Marcaine was injected into each respective transforaminal space.  The patient was observed for 90 seconds post injection.  After no sensory deficits were reported, and normal lower extremity motor function was noted,   the above injectate was administered so that equal amounts of the injectate were placed at each foramen (level) into the transforaminal epidural space.   Additional Comments:  No complications occurred Dressing: 2 x 2 sterile gauze and Band-Aid    Post-procedure details: Patient was observed during the procedure. Post-procedure instructions were reviewed.  Patient left the clinic in stable condition.

## 2023-04-10 NOTE — Progress Notes (Signed)
Sandra Lozano - 62 y.o. female MRN [id number]  Date of birth: [birth year]  Office Visit Note: Visit Date: 03/26/2023 PCP: Lockie Mola, MD Referred by: Lockie Mola, MD  Subjective: Chief Complaint  Patient presents with   Lower Back - Pain   HPI:  Sandra Lozano is a 62 y.o. female who comes in today for evaluation and management at the request of Dr. Aldean Baker for chronic and worsening and severe right hip and leg pain consistent with radiculopathy radicular leg pain.  More of an L5 distribution down the backside of the leg.  Some feeling of weakness but no focal weakness or foot drop.  We have seen her in the past for epidural injection for known stenosis at L4-5.  MRI from 2019 reviewed fully below but shows severe central stenosis at L4-5.  No recent falls or injuries.  Her case is complicated by diabetes.  She has tried and failed medication management and continues with home exercise.  Has had physical therapy in the past but not recently.  Has never had spine surgery.   I spent more than 30 minutes speaking face-to-face with the patient with 50% of the time in counseling and discussing coordination of care.   Review of Systems  Musculoskeletal:  Positive for back pain and joint pain.  Neurological:  Positive for tingling and weakness.  All other systems reviewed and are negative.  Otherwise per HPI.  Assessment & Plan: Visit Diagnoses:    ICD-10-CM   1. Lumbar radiculopathy  M54.16 XR C-ARM NO REPORT    Epidural Steroid injection    methylPREDNISolone acetate (DEPO-MEDROL) injection 80 mg    2. Spinal stenosis of lumbar region with neurogenic claudication  M48.062     3. Chronic pain syndrome  G89.4       Plan: Findings:  Chronic and worsening and recalcitrant low back right hip and leg pain consistent with lumbar radiculopathy likely from severe stenosis at L4-5.  Prior injection was temporarily beneficial and diagnostic and it was over a year ago.  No real new  injury.  Will complete injection today to see if she gets some relief.  Neck step would likely unfortunately be reviewed with orthopedic spine surgery or neurosurgery for possible decompression / laminectomy.    Meds & Orders:  Meds ordered this encounter  Medications   methylPREDNISolone acetate (DEPO-MEDROL) injection 80 mg    Orders Placed This Encounter  Procedures   XR C-ARM NO REPORT   Epidural Steroid injection    Follow-up: Return for visit to requesting provider as needed.   Procedures: No procedures performed  Lumbosacral Transforaminal Epidural Steroid Injection - Sub-Pedicular Approach with Fluoroscopic Guidance  Patient: Sandra Lozano      Date of Birth: [birth year] MRN: [id number] PCP: Lockie Mola, MD      Visit Date: 03/26/2023   Universal Protocol:    Date/Time: 03/26/2023  Consent Given By: the patient  Position: PRONE  Additional Comments: Vital signs were monitored before and after the procedure. Patient was prepped and draped in the usual sterile fashion. The correct patient, procedure, and site was verified.   Injection Procedure Details:   Procedure diagnoses: Lumbar radiculopathy [M54.16]    Meds Administered:  Meds ordered this encounter  Medications   methylPREDNISolone acetate (DEPO-MEDROL) injection 80 mg    Laterality: Right  Location/Site: L4  Needle:5.0 in., 22 ga.  Short bevel or Quincke spinal needle  Needle Placement: Transforaminal  Findings:    -Comments: Excellent flow  of contrast along the nerve, nerve root and into the epidural space.  Procedure Details: After squaring off the end-plates to get a true AP view, the C-arm was positioned so that an oblique view of the foramen as noted above was visualized. The target area is just inferior to the "nose of the scotty dog" or sub pedicular. The soft tissues overlying this structure were infiltrated with 2-3 ml. of 1% Lidocaine without Epinephrine.  The spinal needle was  inserted toward the target using a "trajectory" view along the fluoroscope beam.  Under AP and lateral visualization, the needle was advanced so it did not puncture dura and was located close the 6 O'Clock position of the pedical in AP tracterory. Biplanar projections were used to confirm position. Aspiration was confirmed to be negative for CSF and/or blood. A 1-2 ml. volume of Isovue-250 was injected and flow of contrast was noted at each level. Radiographs were obtained for documentation purposes.   After attaining the desired flow of contrast documented above, a 0.5 to 1.0 ml test dose of 0.25% Marcaine was injected into each respective transforaminal space.  The patient was observed for 90 seconds post injection.  After no sensory deficits were reported, and normal lower extremity motor function was noted,   the above injectate was administered so that equal amounts of the injectate were placed at each foramen (level) into the transforaminal epidural space.   Additional Comments:  No complications occurred Dressing: 2 x 2 sterile gauze and Band-Aid    Post-procedure details: Patient was observed during the procedure. Post-procedure instructions were reviewed.  Patient left the clinic in stable condition.    Clinical History: EXAM: MRI LUMBAR SPINE WITHOUT CONTRAST   TECHNIQUE: Multiplanar, multisequence MR imaging of the lumbar spine was performed. No intravenous contrast was administered.   COMPARISON:  Lumbar radiographs 09/28/2018   FINDINGS: Segmentation:  Normal   Alignment:  7 mm anterolisthesis L4-5.  Remaining alignment normal   Vertebrae: Negative for fracture or mass. Bone marrow edema at L4-5 related to discogenic change.   Conus medullaris and cauda equina: Conus extends to the L1-2 level. Conus and cauda equina appear normal.   Paraspinal and other soft tissues: Negative for paraspinous mass or fluid collection   Disc levels:   L1-2: Negative   L2-3:  Negative   L3-4: Mild facet degeneration. Negative for disc protrusion or stenosis   L4-5: 7 mm anterolisthesis. Severe facet degeneration bilaterally. Severe disc degeneration with endplate erosive change and edema. Severe spinal stenosis. Severe subarticular foraminal stenosis left greater than right   L5-S1: Negative   IMPRESSION: Severe spinal stenosis L4-5 with severe subarticular foraminal stenosis bilaterally left greater than right. Grade 1 anterolisthesis L4-5. Remaining lumbar spine negative     Electronically Signed   By: Marlan Palau M.D.   On: 11/13/2018 15:34     Objective:  VS:  HT:    WT:   BMI:     BP:(!) 186/75  HR:68bpm  TEMP: ( )  RESP:  Physical Exam Vitals and nursing note reviewed.  Constitutional:      General: She is not in acute distress.    Appearance: Normal appearance. She is well-developed. She is not ill-appearing.  HENT:     Head: Normocephalic and atraumatic.     Right Ear: External ear normal.     Left Ear: External ear normal.  Eyes:     Extraocular Movements: Extraocular movements intact.     Conjunctiva/sclera: Conjunctivae normal.  Pupils: Pupils are equal, round, and reactive to light.  Cardiovascular:     Rate and Rhythm: Normal rate.     Pulses: Normal pulses.  Pulmonary:     Effort: Pulmonary effort is normal. No respiratory distress.  Abdominal:     General: There is no distension.     Palpations: Abdomen is soft.  Musculoskeletal:        General: Tenderness present.     Cervical back: Neck supple.     Right lower leg: No edema.     Left lower leg: No edema.     Comments: Patient has good distal strength with no pain over the greater trochanters.  No clonus or focal weakness.  Skin:    General: Skin is warm and dry.     Findings: No erythema, lesion or rash.  Neurological:     General: No focal deficit present.     Mental Status: She is alert and oriented to person, place, and time.     Sensory: No sensory  deficit.     Motor: No weakness or abnormal muscle tone.     Coordination: Coordination normal.     Gait: Gait normal.  Psychiatric:        Mood and Affect: Mood normal.        Behavior: Behavior normal.      Imaging: No results found.

## 2023-04-11 ENCOUNTER — Other Ambulatory Visit: Payer: Self-pay | Admitting: Family Medicine

## 2023-04-11 DIAGNOSIS — E119 Type 2 diabetes mellitus without complications: Secondary | ICD-10-CM

## 2023-04-15 DIAGNOSIS — Z419 Encounter for procedure for purposes other than remedying health state, unspecified: Secondary | ICD-10-CM | POA: Diagnosis not present

## 2023-04-25 ENCOUNTER — Other Ambulatory Visit: Payer: Self-pay | Admitting: Family Medicine

## 2023-04-25 DIAGNOSIS — E119 Type 2 diabetes mellitus without complications: Secondary | ICD-10-CM

## 2023-05-02 ENCOUNTER — Other Ambulatory Visit: Payer: Self-pay | Admitting: Family Medicine

## 2023-05-02 DIAGNOSIS — E785 Hyperlipidemia, unspecified: Secondary | ICD-10-CM

## 2023-05-16 DIAGNOSIS — Z419 Encounter for procedure for purposes other than remedying health state, unspecified: Secondary | ICD-10-CM | POA: Diagnosis not present

## 2023-05-23 ENCOUNTER — Other Ambulatory Visit: Payer: Self-pay | Admitting: Family Medicine

## 2023-05-23 DIAGNOSIS — E119 Type 2 diabetes mellitus without complications: Secondary | ICD-10-CM

## 2023-05-25 ENCOUNTER — Other Ambulatory Visit: Payer: Self-pay | Admitting: Family Medicine

## 2023-05-25 DIAGNOSIS — G5601 Carpal tunnel syndrome, right upper limb: Secondary | ICD-10-CM

## 2023-06-01 ENCOUNTER — Ambulatory Visit: Payer: Medicaid Other | Admitting: Family Medicine

## 2023-06-02 ENCOUNTER — Ambulatory Visit (INDEPENDENT_AMBULATORY_CARE_PROVIDER_SITE_OTHER): Payer: Medicaid Other | Admitting: Family Medicine

## 2023-06-02 ENCOUNTER — Encounter: Payer: Self-pay | Admitting: Family Medicine

## 2023-06-02 VITALS — BP 112/61 | HR 77 | Ht 66.0 in | Wt 196.4 lb

## 2023-06-02 DIAGNOSIS — L732 Hidradenitis suppurativa: Secondary | ICD-10-CM

## 2023-06-02 DIAGNOSIS — E119 Type 2 diabetes mellitus without complications: Secondary | ICD-10-CM | POA: Diagnosis not present

## 2023-06-02 MED ORDER — DOXYCYCLINE HYCLATE 100 MG PO TABS: 100.0000 mg | ORAL_TABLET | Freq: Two times a day (BID) | ORAL | 0 refills | Status: DC

## 2023-06-02 MED ORDER — CLINDAMYCIN PHOSPHATE 1 % EX SOLN: Freq: Two times a day (BID) | CUTANEOUS | 0 refills | Status: DC

## 2023-06-02 MED ORDER — SEMAGLUTIDE (1 MG/DOSE) 4 MG/3ML ~~LOC~~ SOPN: 1.0000 mg | PEN_INJECTOR | SUBCUTANEOUS | 1 refills | Status: DC

## 2023-06-02 NOTE — Progress Notes (Unsigned)
    SUBJECTIVE:   CHIEF COMPLAINT / HPI:   Boils in R Axilla  Patient reports that she had one small boil in her right axilla starting one month ago. Since then she has rapidly had more boils show up in her right axilla. She denies any fevers. Says they do sometimes drain and that it is very painful and uncomfortable. Says that she has tried warm compresses and used PRID ointment which has helped with pain a little. Has never had boils like this in her axillas or groin area. Does not smoke, has been losing weight recently. No family history of HS.   Weight Loss Patient reports she has been losing weight faster than she would like to due to the ozempic. When she first started she did have side effect of diarrhea and fecal urgency. However, as she increased in dosage she was able to tolerate the medication more and had less side effects. However, she says that she feels she is losing weight at a faster rate than she would like. She does eat throughout the day. She has lost 20 pounds in one year.   PERTINENT  PMH / PSH: T2DM, HTN, HLD   OBJECTIVE:   BP 112/61   Pulse 77   Ht 5\' 6"  (1.676 m)   Wt 196 lb 6.4 oz (89.1 kg)   LMP  (LMP Unknown)   SpO2 97%   BMI 31.70 kg/m   General: well appearing, in no acute distress CV: RRR, radial pulses equal and palpable, no BLE edema  Resp: Normal work of breathing on room air, CTAB Abd: Soft, non tender, non distended  Neuro: Alert & Oriented x 4  Skin: numerous pustules in the R axilla with some hyperpigmented tracks/creases in between, some pustules open with pink healed tissue in the center, no drainage, or erythema.    ASSESSMENT/PLAN:   Hidradenitis suppurativa of right axilla Assessment & Plan: Stage 2 HS in the right axilla. New onset. Patient has risk factors (female, obesity, diabetes, HLD), no smoking.  - ibuprofen for pain  - Modifiable risk factors are being addressed   Orders: -     Doxycycline Hyclate; Take 1 tablet (100 mg  total) by mouth 2 (two) times daily.  Dispense: 28 tablet; Refill: 0 -     Clindamycin Phosphate; Apply topically 2 (two) times daily.  Dispense: 30 mL; Refill: 0 -     Ambulatory referral to Dermatology  Well controlled diabetes mellitus (HCC) -     Semaglutide (1 MG/DOSE); Inject 1 mg into the skin once a week.  Dispense: 3 mL; Refill: 1 -     Ambulatory referral to Ophthalmology  - decreased semaglutide dose from 2 > 1     Lockie Mola, MD [facility]

## 2023-06-02 NOTE — Patient Instructions (Signed)
It was wonderful to see you today.  Please bring ALL of your medications with you to every visit.   Today we talked about:  Boils in the arm pit - Please take doxycyline for 2 weeks. You can use the clindamycin solution in the armpit as well. Take tylenol and ibuprofen for the pain.   Please follow up with me on July 8th at 8:55 am.   Thank you for choosing [facility].   Please call [phone] with any questions about today's appointment.  Lockie Mola, MD  Family Medicine

## 2023-06-03 ENCOUNTER — Encounter: Payer: Self-pay | Admitting: Podiatry

## 2023-06-03 ENCOUNTER — Ambulatory Visit (INDEPENDENT_AMBULATORY_CARE_PROVIDER_SITE_OTHER): Payer: Medicaid Other | Admitting: Podiatry

## 2023-06-03 VITALS — BP 154/72 | HR 66

## 2023-06-03 DIAGNOSIS — L732 Hidradenitis suppurativa: Secondary | ICD-10-CM | POA: Insufficient documentation

## 2023-06-03 DIAGNOSIS — M778 Other enthesopathies, not elsewhere classified: Secondary | ICD-10-CM

## 2023-06-03 DIAGNOSIS — M2042 Other hammer toe(s) (acquired), left foot: Secondary | ICD-10-CM

## 2023-06-03 DIAGNOSIS — M7752 Other enthesopathy of left foot: Secondary | ICD-10-CM

## 2023-06-03 MED ORDER — BETAMETHASONE SOD PHOS & ACET 6 (3-3) MG/ML IJ SUSP
3.0000 mg | Freq: Once | INTRAMUSCULAR | Status: AC
  Administered 2023-06-03: 3 mg via INTRA_ARTICULAR

## 2023-06-03 NOTE — Assessment & Plan Note (Addendum)
Stage 2 HS in the right axilla. New onset. Patient has risk factors (female, obesity, diabetes, HLD), no smoking.  - ibuprofen for pain  - Modifiable risk factors are being addressed

## 2023-06-03 NOTE — Progress Notes (Signed)
Chief Complaint  Patient presents with   Toe Pain    " My fourth toe hurts.  My toes claw up in my shoe like they're trying to grab it."    HPI: 62 y.o. female presenting today for complaint of hammertoes to the lesser digits of the bilateral feet especially the left foot.  She says that with sandals her toes seem to grab the ground and flex more.  She does not notices much with regular tennis shoes.  She has also been experiencing some stiffness with mild pain throughout the left midfoot at her surgical area.  She did surgery about 2 years ago to the left midfoot.  She says that her orthopedist is recommending right knee surgery.  Past Medical History:  Diagnosis Date   Arthritis    right knee, lower back   Chest wall pain 04/20/2021   Dyspnea    very rare -tx with albuterol neb sol if needed   Hyperlipidemia    Hypertension    Intertrigo 08/06/2022   Pre-diabetes    per patient - does not check blood sugar   Right hip pain 05/05/2018   Rupture of anterior cruciate ligament of right knee 02/15/2018   Sprain of medial collateral ligament of right knee 02/15/2018   SVD (spontaneous vaginal delivery)    x 5 - only 2 living, 1 stillborn and 1 premature at 7 months demise,1 child deceased   Wears dentures    upper and lower    Past Surgical History:  Procedure Laterality Date   FOOT ARTHRODESIS Left 11/02/2020   Procedure: LEFT MIDFOOT FUSION BASE 1ST AND 2ND METATARSAL;  Surgeon: Nadara Mustard, MD;  Location: [facility] OR;  Service: Orthopedics;  Laterality: Left;   LUMBAR FUSION  05/20/2019   GILL PROCEDURE, LEFT TRANSFORAMINAL LUMBAR INTERBODY FUSION, PEDICLE INSTRUMENTATION, BILATERAL FUSION (N/A   TUBAL LIGATION      No Known Allergies   Physical Exam: General: The patient is alert and oriented x3 in no acute distress.  Dermatology: Skin is warm, dry and supple bilateral lower extremities.   Vascular: Palpable pedal pulses bilaterally. Capillary refill within normal limits.  No  appreciable edema.  No erythema.  Neurological: Grossly intact via light touch  Musculoskeletal Exam: Flexible; reducible hammertoe deformity noted digits 2-5 bilateral  Radiographic Exam LT foot 10/01/2022:  Normal osseous mineralization.  Orthopedic screws noted through the first TMT as well as the medial cuneiform into the base of the second metatarsal.  Appears stable and intact.  No radiographic evidence of loosening or hardware failure.  Assessment/Plan of Care: 1.  Capsulitis with stiffness left midfoot secondary to surgery 2.  Reducible hammertoes lesser digits bilateral -Injection of 0.5 cc Celestone Soluspan injected around the first TMT of the left foot to help break up scar tissue and alleviate some of the symptoms she is experiencing -Recommend good supportive shoes and sneakers with arch supports -Advised against going barefoot.  Also explained that sandals do create a tendency for her toes to plantarflex in addition to the hammertoes she already has.  Advised against sandals -We did lightly discuss surgical intervention to correct for hammertoe deformity but for now recommend conservative treatment.  She they are minimally symptomatic and only has an issue with open toe sandals and occasionally with close toed shoes. -Return to clinic as needed       Felecia Shelling, DPM [facility]  Dr. Felecia Shelling, DPM    [address]  [place], [place] [address]                Office [phone]  Fax [phone]

## 2023-06-05 ENCOUNTER — Encounter: Payer: Self-pay | Admitting: Orthopedic Surgery

## 2023-06-11 NOTE — Telephone Encounter (Signed)
I spoke with Sandra Lozano.  She was originally holding off on total knee replacement because her apartment complex was planning on renovating.  She states that has not happened yet.  However, she has started using a cream on her knee, has some new shoe wear and is using castor oil and these things have helped her knee pain tremendously.  She is no longer  having to take tylenol, the knee is no longer inflamed. Advised her to call if anything changes and she wants to schedule the surgery.  

## 2023-06-11 NOTE — Telephone Encounter (Signed)
I spoke with Sandra Lozano.  She was originally holding off on total knee replacement because her apartment complex was planning on renovating.  She states that has not happened yet.  However, she has started using a cream on her knee, has some new shoe wear and is using castor oil and these things have helped her knee pain tremendously.  She is no longer  having to take tylenol, the knee is no longer inflamed. Advised her to call if anything changes and she wants to schedule the surgery.

## 2023-06-15 ENCOUNTER — Encounter: Payer: Self-pay | Admitting: Orthopedic Surgery

## 2023-06-15 DIAGNOSIS — Z419 Encounter for procedure for purposes other than remedying health state, unspecified: Secondary | ICD-10-CM | POA: Diagnosis not present

## 2023-06-22 ENCOUNTER — Ambulatory Visit: Payer: Self-pay | Admitting: Family Medicine

## 2023-06-22 ENCOUNTER — Other Ambulatory Visit: Payer: Self-pay | Admitting: Family Medicine

## 2023-06-22 DIAGNOSIS — G5601 Carpal tunnel syndrome, right upper limb: Secondary | ICD-10-CM

## 2023-06-24 ENCOUNTER — Other Ambulatory Visit: Payer: Self-pay | Admitting: Family Medicine

## 2023-06-24 DIAGNOSIS — G5601 Carpal tunnel syndrome, right upper limb: Secondary | ICD-10-CM

## 2023-06-25 ENCOUNTER — Telehealth: Payer: Self-pay | Admitting: Physical Medicine and Rehabilitation

## 2023-06-25 NOTE — Telephone Encounter (Signed)
Patient called needing to schedule an appointment with Dr Alvester Morin for an injection in her back. The number to contact patient is [phone]

## 2023-06-27 ENCOUNTER — Other Ambulatory Visit: Payer: Self-pay | Admitting: Family Medicine

## 2023-06-27 DIAGNOSIS — E119 Type 2 diabetes mellitus without complications: Secondary | ICD-10-CM

## 2023-06-28 ENCOUNTER — Other Ambulatory Visit: Payer: Self-pay | Admitting: Family Medicine

## 2023-06-28 DIAGNOSIS — L732 Hidradenitis suppurativa: Secondary | ICD-10-CM

## 2023-06-29 NOTE — Telephone Encounter (Signed)
I called pt. She received 40% improvement for 2 1/2 months, she stated she is having knee replacement too on 7/26. Wanted to make Dr. Alvester Morin aware of that

## 2023-06-29 NOTE — Telephone Encounter (Signed)
Can you please see messages below and advise if ok to proceed with injection  with FN with knee replacement upcoming

## 2023-06-30 NOTE — Progress Notes (Signed)
Surgical Instructions    Your procedure is scheduled on Friday July 10, 2023.  Report to [facility] Main Entrance "A" at 6:45 A.M., then check in with the Admitting office.  Call this number if you have problems the morning of surgery:  [phone]   If you have any questions prior to your surgery date call [phone]: Open Monday-Friday 8am-4pm If you experience any cold or flu symptoms such as cough, fever, chills, shortness of breath, etc. between now and your scheduled surgery, please notify us at the above number     Remember:  Do not eat after midnight the night before your surgery.  You may drink clear liquids until 5:45 the morning of your surgery.   Clear liquids allowed are: Water, Non-Citrus Juices (without pulp), Carbonated Beverages, Clear Tea, Black Coffee ONLY (NO MILK, CREAM OR POWDERED CREAMER of any kind), and Gatorade.    Take these medicines the morning of surgery with A SIP OF WATER:  RESTASIS eye drops   If needed:  acetaminophen (TYLENOL)   As of today, STOP taking any Aspirin (unless otherwise instructed by your surgeon) Aleve, Naproxen, Ibuprofen, Motrin, Advil, Goody's, BC's, all herbal medications, fish oil, and all vitamins. This includes diclofenac Sodium (VOLTAREN) and meloxicam (MOBIC).   WHAT DO I DO ABOUT MY DIABETES MEDICATION?   Do not take oral diabetes medicines (pills) the morning of surgery. This includes metFORMIN (GLUCOPHAGE).  Stop taking Semaglutide 7 days prior to surgery. Last dose on 07/03/2023.   The day of surgery, do not take other diabetes injectables, including Byetta (exenatide), Bydureon (exenatide ER), Victoza (liraglutide), or Trulicity (dulaglutide).  If your CBG is greater than 220 mg/dL, you may take  of your sliding scale (correction) dose of insulin.   HOW TO MANAGE YOUR DIABETES BEFORE AND AFTER SURGERY  Why is it important to control my blood sugar before and after surgery? Improving blood sugar levels before  and after surgery helps healing and can limit problems. A way of improving blood sugar control is eating a healthy diet by:  Eating less sugar and carbohydrates  Increasing activity/exercise  Talking with your doctor about reaching your blood sugar goals High blood sugars (greater than 180 mg/dL) can raise your risk of infections and slow your recovery, so you will need to focus on controlling your diabetes during the weeks before surgery. Make sure that the doctor who takes care of your diabetes knows about your planned surgery including the date and location.  How do I manage my blood sugar before surgery? Check your blood sugar at least 4 times a day, starting 2 days before surgery, to make sure that the level is not too high or low.  Check your blood sugar the morning of your surgery when you wake up and every 2 hours until you get to the Short Stay unit.  If your blood sugar is less than 70 mg/dL, you will need to treat for low blood sugar: Do not take insulin. Treat a low blood sugar (less than 70 mg/dL) with  cup of clear juice (cranberry or apple), 4 glucose tablets, OR glucose gel. Recheck blood sugar in 15 minutes after treatment (to make sure it is greater than 70 mg/dL). If your blood sugar is not greater than 70 mg/dL on recheck, call [phone] for further instructions. Report your blood sugar to the short stay nurse when you get to Short Stay.  If you are admitted to the hospital after surgery: Your blood sugar will be checked  by the staff and you will probably be given insulin after surgery (instead of oral diabetes medicines) to make sure you have good blood sugar levels. The goal for blood sugar control after surgery is 80-180 mg/dL.   Special instructions:    Oral Hygiene is also important to reduce your risk of infection.  Remember - BRUSH YOUR TEETH THE MORNING OF SURGERY WITH YOUR REGULAR TOOTHPASTE    Pre-operative 5 CHG Bath Instructions   You can play a key  role in reducing the risk of infection after surgery. Your skin needs to be as free of germs as possible. You can reduce the number of germs on your skin by washing with CHG (chlorhexidine gluconate) soap before surgery. CHG is an antiseptic soap that kills germs and continues to kill germs even after washing.   DO NOT use if you have an allergy to chlorhexidine/CHG or antibacterial soaps. If your skin becomes reddened or irritated, stop using the CHG and notify one of our RNs at [phone].   Please shower with the CHG soap starting 4 days before surgery using the following schedule:     Please keep in mind the following:  DO NOT shave, including legs and underarms, starting the day of your first shower.   You may shave your face at any point before/day of surgery.  Place clean sheets on your bed the day you start using CHG soap. Use a clean washcloth (not used since being washed) for each shower. DO NOT sleep with pets once you start using the CHG.   CHG Shower Instructions:  If you choose to wash your hair and private area, wash first with your normal shampoo/soap.  After you use shampoo/soap, rinse your hair and body thoroughly to remove shampoo/soap residue.  Turn the water OFF and apply about 3 tablespoons (45 ml) of CHG soap to a CLEAN washcloth.  Apply CHG soap ONLY FROM YOUR NECK DOWN TO YOUR TOES (washing for 3-5 minutes)  DO NOT use CHG soap on face, private areas, open wounds, or sores.  Pay special attention to the area where your surgery is being performed.  If you are having back surgery, having someone wash your back for you may be helpful. Wait 2 minutes after CHG soap is applied, then you may rinse off the CHG soap.  Pat dry with a clean towel  Put on clean clothes/pajamas   If you choose to wear lotion, please use ONLY the CHG-compatible lotions on the back of this paper.     Additional instructions for the day of surgery: DO NOT APPLY any lotions, deodorants,  cologne, or perfumes.   Put on clean/comfortable clothes.  Brush your teeth.  Ask your nurse before applying any prescription medications to the skin.      CHG Compatible Lotions   Aveeno Moisturizing lotion  Cetaphil Moisturizing Cream  Cetaphil Moisturizing Lotion  Clairol Herbal Essence Moisturizing Lotion, Dry Skin  Clairol Herbal Essence Moisturizing Lotion, Extra Dry Skin  Clairol Herbal Essence Moisturizing Lotion, Normal Skin  Curel Age Defying Therapeutic Moisturizing Lotion with Alpha Hydroxy  Curel Extreme Care Body Lotion  Curel Soothing Hands Moisturizing Hand Lotion  Curel Therapeutic Moisturizing Cream, Fragrance-Free  Curel Therapeutic Moisturizing Lotion, Fragrance-Free  Curel Therapeutic Moisturizing Lotion, Original Formula  Eucerin Daily Replenishing Lotion  Eucerin Dry Skin Therapy Plus Alpha Hydroxy Crme  Eucerin Dry Skin Therapy Plus Alpha Hydroxy Lotion  Eucerin Original Crme  Eucerin Original Lotion  Eucerin Plus Crme Eucerin Plus Lotion  Eucerin TriLipid Replenishing Lotion  Keri Anti-Bacterial Hand Lotion  Keri Deep Conditioning Original Lotion Dry Skin Formula Softly Scented  Keri Deep Conditioning Original Lotion, Fragrance Free Sensitive Skin Formula  Keri Lotion Fast Absorbing Fragrance Free Sensitive Skin Formula  Keri Lotion Fast Absorbing Softly Scented Dry Skin Formula  Keri Original Lotion  Keri Skin Renewal Lotion Keri Silky Smooth Lotion  Keri Silky Smooth Sensitive Skin Lotion  Nivea Body Creamy Conditioning Oil  Nivea Body Extra Enriched Lotion  Nivea Body Original Lotion  Nivea Body Sheer Moisturizing Lotion Nivea Crme  Nivea Skin Firming Lotion  NutraDerm 30 Skin Lotion  NutraDerm Skin Lotion  NutraDerm Therapeutic Skin Cream  NutraDerm Therapeutic Skin Lotion  ProShield Protective Hand Cream  Provon moisturizing lotion    [facility] is not responsible for any belongings or valuables.    Do NOT Smoke (Tobacco/Vaping)   24 hours prior to your procedure  If you use a CPAP at night, you may bring your mask for your overnight stay.   Contacts, glasses, hearing aids, dentures or partials may not be worn into surgery, please bring cases for these belongings   For patients admitted to the hospital, discharge time will be determined by your treatment team.   Patients discharged the day of surgery will not be allowed to drive home, and someone needs to stay with them for 24 hours.   SURGICAL WAITING ROOM VISITATION Patients having surgery or a procedure may have no more than 2 support people in the waiting area - these visitors may rotate.   Children under the age of 81 must have an adult with them who is not the patient. If the patient needs to stay at the hospital during part of their recovery, the visitor guidelines for inpatient rooms apply. Pre-op nurse will coordinate an appropriate time for 1 support person to accompany patient in pre-op.  This support person may not rotate.   Please refer to [email or web address] for the visitor guidelines for Inpatients (after your surgery is over and you are in a regular room).   If you received a COVID test during your pre-op visit, it is requested that you wear a mask when out in public, stay away from anyone that may not be feeling well, and notify your surgeon if you develop symptoms. If you have been in contact with anyone that has tested positive in the last 10 days, please notify your surgeon.    Please read over the following fact sheets that you were given.

## 2023-06-30 NOTE — Telephone Encounter (Signed)
I called pt to advise of message below but wanted to send to let you know as well. Thanks!

## 2023-07-01 ENCOUNTER — Encounter (HOSPITAL_COMMUNITY)
Admission: RE | Admit: 2023-07-01 | Discharge: 2023-07-01 | Disposition: A | Discharge status: Home / Self Care | Payer: Medicaid Other | Source: Ambulatory Visit | Attending: Orthopedic Surgery | Admitting: Orthopedic Surgery

## 2023-07-01 ENCOUNTER — Other Ambulatory Visit: Payer: Self-pay

## 2023-07-01 ENCOUNTER — Encounter (HOSPITAL_COMMUNITY): Payer: Self-pay

## 2023-07-01 VITALS — BP 131/112 | HR 73 | Temp 98.0°F | Resp 18 | Ht 64.0 in | Wt 189.7 lb

## 2023-07-01 DIAGNOSIS — Z01812 Encounter for preprocedural laboratory examination: Secondary | ICD-10-CM | POA: Insufficient documentation

## 2023-07-01 DIAGNOSIS — R7303 Prediabetes: Secondary | ICD-10-CM | POA: Insufficient documentation

## 2023-07-01 DIAGNOSIS — Z01818 Encounter for other preprocedural examination: Secondary | ICD-10-CM

## 2023-07-01 LAB — BASIC METABOLIC PANEL
Anion gap: 4 — ABNORMAL LOW (ref 5–15)
BUN: 19 mg/dL (ref 8–23)
CO2: 27 mmol/L (ref 22–32)
Calcium: 9.1 mg/dL (ref 8.9–10.3)
Chloride: 105 mmol/L (ref 98–111)
Creatinine, Ser: 0.72 mg/dL (ref 0.44–1.00)
GFR, Estimated: >60 mL/min (ref >60)
Glucose, Bld: 95 mg/dL (ref 70–99)
Potassium: 4.2 mmol/L (ref 3.5–5.1)
Sodium: 136 mmol/L (ref 135–145)

## 2023-07-01 LAB — HEMOGLOBIN A1C
Hgb A1c MFr Bld: 5.5 % (ref 4.8–5.6)
Mean Plasma Glucose: 111.15 mg/dL

## 2023-07-01 LAB — CBC
HCT: 38.5 % (ref 36.0–46.0)
Hemoglobin: 12.3 g/dL (ref 12.0–15.0)
MCH: 28.1 pg (ref 26.0–34.0)
MCHC: 31.9 g/dL (ref 30.0–36.0)
MCV: 87.9 fL (ref 80.0–100.0)
Platelets: 296 10*3/uL (ref 150–400)
RBC: 4.38 MIL/uL (ref 3.87–5.11)
RDW: 13.2 % (ref 11.5–15.5)
WBC: 6.1 10*3/uL (ref 4.0–10.5)
nRBC: 0 % (ref 0.0–0.2)

## 2023-07-01 LAB — SURGICAL PCR SCREEN
MRSA, PCR: NEGATIVE
Staphylococcus aureus: NEGATIVE

## 2023-07-01 NOTE — Progress Notes (Signed)
PCP - Lockie Mola, MD Cardiologist - Denies  PPM/ICD - Denies  Chest x-ray - 03/05/2023 EKG - 03/06/2023 Stress Test - Denies ECHO - Denies Cardiac Cath - Denies  Sleep Study - Denies  DM: pre-diabetic Fasting Blood Sugar: 80-100 Checks Blood Sugar once a day  Last dose of GLP1 agonist-  07/03/2023. GLP1 instructions: Hold Ozempic 7 days prior to procedure. Last dose on 07/03/2023.  Blood Thinner Instructions: N/A Aspirin Instructions: N/A  ERAS Protcol - Yes PRE-SURGERY Ensure or G2- N/A  COVID TEST- No   Anesthesia review: No  Patient denies shortness of breath, fever, cough and chest pain at PAT appointment   All instructions explained to the patient, with a verbal understanding of the material. Patient agrees to go over the instructions while at home for a better understanding.The opportunity to ask questions was provided.

## 2023-07-02 ENCOUNTER — Encounter: Payer: Self-pay | Admitting: Family Medicine

## 2023-07-02 DIAGNOSIS — E119 Type 2 diabetes mellitus without complications: Secondary | ICD-10-CM

## 2023-07-02 MED ORDER — SEMAGLUTIDE (1 MG/DOSE) 4 MG/3ML ~~LOC~~ SOPN
1.0000 mg | PEN_INJECTOR | SUBCUTANEOUS | 1 refills | Status: AC
Start: 2023-07-02 — End: ?

## 2023-07-05 ENCOUNTER — Encounter: Payer: Self-pay | Admitting: Physical Medicine and Rehabilitation

## 2023-07-08 ENCOUNTER — Ambulatory Visit (INDEPENDENT_AMBULATORY_CARE_PROVIDER_SITE_OTHER): Payer: Medicaid Other | Admitting: Podiatry

## 2023-07-08 DIAGNOSIS — M7751 Other enthesopathy of right foot: Secondary | ICD-10-CM | POA: Diagnosis not present

## 2023-07-08 NOTE — Progress Notes (Signed)
   No chief complaint on file.   HPI: 62 y.o. female presenting today for follow-up evaluation of right ankle pain and stiffness to the bilateral feet.  History of Lapidus bunionectomy left lower extremity about 2 years ago with Dr. Lajoyce Corners.  Currently scheduled for knee replacement surgery 07/17/2023 with Dr. Lajoyce Corners.  Presenting for follow-up treatment evaluation to the right ankle and left foot  Past Medical History:  Diagnosis Date   Arthritis    right knee, lower back   Chest wall pain 04/20/2021   Dyspnea    very rare -tx with albuterol neb sol if needed   Hyperlipidemia    Hypertension    Intertrigo 08/06/2022   Pre-diabetes    per patient - does not check blood sugar   Right hip pain 05/05/2018   Rupture of anterior cruciate ligament of right knee 02/15/2018   Sprain of medial collateral ligament of right knee 02/15/2018   SVD (spontaneous vaginal delivery)    x 5 - only 2 living, 1 stillborn and 1 premature at 7 months demise,1 child deceased   Wears dentures    upper and lower    Past Surgical History:  Procedure Laterality Date   FOOT ARTHRODESIS Left 11/02/2020   Procedure: LEFT MIDFOOT FUSION BASE 1ST AND 2ND METATARSAL;  Surgeon: Nadara Mustard, MD;  Location: [facility] OR;  Service: Orthopedics;  Laterality: Left;   LUMBAR FUSION  05/20/2019   GILL PROCEDURE, LEFT TRANSFORAMINAL LUMBAR INTERBODY FUSION, PEDICLE INSTRUMENTATION, BILATERAL FUSION (N/A   TUBAL LIGATION      No Known Allergies   Physical Exam: General: The patient is alert and oriented x3 in no acute distress.  Dermatology: Skin is warm, dry and supple bilateral lower extremities.  Hyperkeratotic dystrophic nails noted 1-5 bilateral consistent with fungal nail infection  Vascular: Palpable pedal pulses bilaterally. Capillary refill within normal limits.  No appreciable edema.  No erythema.  Neurological: Grossly intact via light touch  Musculoskeletal Exam: Flexible; reducible hammertoe deformity noted  digits 2-5 bilateral  Radiographic Exam LT foot 10/01/2022:  Normal osseous mineralization.  Orthopedic screws noted through the first TMT as well as the medial cuneiform into the base of the second metatarsal.  Appears stable and intact.  No radiographic evidence of loosening or hardware failure.  Assessment/Plan of Care: 1.  Capsulitis with stiffness left midfoot secondary to surgery 2.  Reducible hammertoes lesser digits bilateral 3.  DJD/arthritis right ankle  - Patient scheduled for knee replacement surgery RLE on 07/17/2023. -No cortisone injection administered today. -Continue wearing good supportive shoes and sneakers. -Continue compression ankle sleeve right -I explained to the patient that postoperatively to the right knee replacement she will likely have physical therapy.  This may help with an improved gait pattern which may help alleviate some of the right ankle pain. -Return to clinic as needed      Felecia Shelling, DPM [facility]  Dr. Felecia Shelling, DPM    [address] [place], [place] [address]                Office [phone]  Fax [phone]

## 2023-07-08 NOTE — Patient Instructions (Signed)
6 

## 2023-07-09 ENCOUNTER — Ambulatory Visit (INDEPENDENT_AMBULATORY_CARE_PROVIDER_SITE_OTHER): Payer: Medicaid Other | Admitting: Student

## 2023-07-09 ENCOUNTER — Other Ambulatory Visit: Payer: Self-pay | Admitting: *Deleted

## 2023-07-09 VITALS — BP 182/80 | HR 72 | Ht 64.0 in | Wt 192.6 lb

## 2023-07-09 DIAGNOSIS — E119 Type 2 diabetes mellitus without complications: Secondary | ICD-10-CM

## 2023-07-09 DIAGNOSIS — G5601 Carpal tunnel syndrome, right upper limb: Secondary | ICD-10-CM

## 2023-07-09 DIAGNOSIS — G5603 Carpal tunnel syndrome, bilateral upper limbs: Secondary | ICD-10-CM

## 2023-07-09 DIAGNOSIS — I10 Essential (primary) hypertension: Secondary | ICD-10-CM | POA: Diagnosis not present

## 2023-07-09 MED ORDER — MELOXICAM 15 MG PO TABS
15.0000 mg | ORAL_TABLET | Freq: Every day | ORAL | 0 refills | Status: DC
Start: 2023-07-09 — End: 2023-10-26

## 2023-07-09 MED ORDER — GABAPENTIN 600 MG PO TABS
900.0000 mg | ORAL_TABLET | Freq: Every day | ORAL | 0 refills | Status: DC
Start: 2023-07-09 — End: 2023-09-24

## 2023-07-09 MED ORDER — METFORMIN HCL 500 MG PO TABS
500.0000 mg | ORAL_TABLET | Freq: Every day | ORAL | 0 refills | Status: DC
Start: 2023-07-09 — End: 2023-10-02

## 2023-07-09 NOTE — Patient Instructions (Signed)
It was great to see you! Thank you for allowing me to participate in your care!   I recommend that you always bring your medications to each appointment as this makes it easy to ensure we are on the correct medications and helps [place] not miss when refills are needed.  Our plans for today:  - Continue meloxicam - Continue metformin - Please check BP 2-3 times per day, keep a record of it - Ask Ortho doctor about wrist pain - Take 1.5 tablets of gabapentin at night (900 mg total)   Take care and seek immediate care sooner if you develop any concerns. Please remember to show up 15 minutes before your scheduled appointment time!  Tiffany Kocher, DO [facility]

## 2023-07-09 NOTE — Assessment & Plan Note (Signed)
Refractory to conservative treatment and cortisone injections-recommend patient speak with orthopedic surgeon for consultation.  If necessary, will send send referral to hand surgeon specifically.  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  Will attempt increase of gabapentin dose.  Continue meloxicam and wrist sleeves at night.  Demonstrated opponens roll technique, which soothed patient's symptoms in the office. - Gabapentin 900 mg nightly - Continue meloxicam, night sleeves - Recommend follow-up with orthopedic surgeon, consider referral to hand specialist

## 2023-07-09 NOTE — Progress Notes (Signed)
    SUBJECTIVE:   CHIEF COMPLAINT / HPI:   Carpal tunnel Patient reached out to PCP on 07/02/2023 regarding concern that gabapentin was not helping her carpal tunnel.  Currently taking 600 mg at night, unable to take during the day due to drowsiness.  She reports significant pain with carpal tunnel. Wears sleeves at night to provide minimal relief. Has received cortisol shot in wrist 6 months ago that provided no relief.  Elevated BP Noted to have elevated BP today, 182/80 on repeat. Takes her BP medication at night.  Denies chest pain, shortness of breath, vision changes, headaches.  Of note patient has right total knee arthroplasty scheduled on 07/17/2023.  Diabetes Patient requests metformin refill.  PERTINENT  PMH / PSH: Essential hypertension, degenerative joint disease multiple sites  OBJECTIVE:   BP (!) 182/80   Pulse 72   Ht 5\' 4"  (1.626 m)   Wt 192 lb 9.6 oz (87.4 kg)   LMP  (LMP Unknown)   SpO2 98%   BMI 33.06 kg/m    General: NAD, pleasant Cardio: RRR, no MRG. Cap Refill <2s. Respiratory: CTAB, normal wob on RA Skin: Warm and dry  ASSESSMENT/PLAN:   Carpal tunnel syndrome, bilateral Refractory to conservative treatment and cortisone injections-recommend patient speak with orthopedic surgeon for consultation.  If necessary, will send send referral to hand surgeon specifically.  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  Will attempt increase of gabapentin dose.  Continue meloxicam and wrist sleeves at night.  Demonstrated opponens roll technique, which soothed patient's symptoms in the office. - Gabapentin 900 mg nightly - Continue meloxicam, night sleeves - Recommend follow-up with orthopedic surgeon, consider referral to hand specialist  Essential hypertension Blood pressure 182/80 in office today.  Asymptomatic.  Currently taking lisinopril-hydrochlorothiazide 20-15 mg at night.  States her pressure runs high at home. - Blood  pressure log - Follow-up appointment scheduled for 07/13/2023   Follow-up recommendations If pressures elevated, may need control with third agent.  Would consider amlodipine.  Tiffany Kocher, DO [facility]

## 2023-07-09 NOTE — Assessment & Plan Note (Addendum)
Blood pressure 182/80 in office today.  Asymptomatic.  Currently taking lisinopril-hydrochlorothiazide 20-15 mg at night.  States her pressure runs high at home. - Blood pressure log - Follow-up appointment scheduled for 07/13/2023

## 2023-07-13 ENCOUNTER — Ambulatory Visit (INDEPENDENT_AMBULATORY_CARE_PROVIDER_SITE_OTHER): Payer: Medicaid Other | Admitting: Student

## 2023-07-13 VITALS — BP 144/64 | HR 70 | Wt 193.6 lb

## 2023-07-13 DIAGNOSIS — I10 Essential (primary) hypertension: Secondary | ICD-10-CM | POA: Diagnosis not present

## 2023-07-13 NOTE — Progress Notes (Signed)
  SUBJECTIVE:   CHIEF COMPLAINT / HPI:   Hypertension: BP: (!) 144/64 today. Home medications include: Lisinopril-HCTZ 40-25 mg daily. She endorses taking these medications as prescribed. Does check blood pressure at home. She is getting values of 150s-180s/80-110s. Patient has had a BMP in the past 1 year.  PERTINENT  PMH / PSH: HTN, arthritis  Patient Care Team: Lockie Mola, MD as PCP - General (Family Medicine) OBJECTIVE:  BP (!) 144/64   Pulse 70   Wt 193 lb 9.6 oz (87.8 kg)   LMP  (LMP Unknown)   SpO2 100%   BMI 33.23 kg/m  General: Well-appearing, NAD CV: RRR, no murmurs auscultated Pulm: CTAB, normal WOB  ASSESSMENT/PLAN:  Essential hypertension Assessment & Plan: Patient's blood pressure is not controlled today. BP: (!) 144/64. Goal of 140/90. Patient's medication regimen includes lisinopril-HCTZ 40-25 mg daily. -Changes to current regimen include none -Referral to pharmacy clinic due to Discrepancy between home and clinic BP and 24-hour blood pressure monitor -Labs: BMP, urine microalbumin/creatinine 7/17 -BP log added to patient's AVS -Follow up in 2 weeks    Return in about 2 weeks (around 07/27/2023) for HTN follow up. [name], DO 07/13/2023, 9:30 AM PGY-3, [facility]

## 2023-07-13 NOTE — Patient Instructions (Addendum)
It was great to see you today! Thank you for choosing [facility] for your primary care. Sandra Lozano was seen for BP follow up.  Today we addressed: Patient decided is best for [place] to hold off on further medication.  I would like you to have a follow-up scheduled with our pharmacist Dr. Raymondo Lozano for consideration of a 24-hour blood pressure monitor.  You may do this after surgery.  Please continue to monitor your blood pressures with the log attached.  If you haven't already, sign up for My Chart to have easy access to your labs results, and communication with your primary care physician.  Return in about 2 weeks (around 07/27/2023) for HTN follow up. Please arrive 15 minutes before your appointment to ensure smooth check in process.  We appreciate your efforts in making this happen.  Thank you for allowing me to participate in your care, [name], DO 07/13/2023, 8:56 AM PGY-3, [facility]    Blood Pressure Record Sheet To take your blood pressure, you will need a blood pressure machine. You can buy a blood pressure machine (blood pressure monitor) at your clinic, drug store, or online. When choosing one, consider: An automatic monitor that has an arm cuff. A cuff that wraps snugly around your upper arm. You should be able to fit only one finger between your arm and the cuff. A device that stores blood pressure reading results. Do not choose a monitor that measures your blood pressure from your wrist or finger. Follow your health care provider's instructions for how to take your blood pressure. To use this form: Take your blood pressure medications every day These measurements should be taken when you have been at rest for at least 10-15 min Take at least 2 readings with each blood pressure check. This makes sure the results are correct. Wait 1-2 minutes between measurements. Write down the results in the spaces on this form. Keep in mind it should always be  recorded systolic over diastolic. Both numbers are important.  Repeat this every day for 2-3 weeks, or as told by your health care provider.  Make a follow-up appointment with your health care provider to discuss the results.  Blood Pressure Log Date Medications taken? (Y/N) Blood Pressure Time of Day

## 2023-07-13 NOTE — Assessment & Plan Note (Addendum)
Patient's blood pressure is not controlled today. BP: (!) 144/64. Goal of 140/90. Patient's medication regimen includes lisinopril-HCTZ 40-25 mg daily. -Changes to current regimen include none -Referral to pharmacy clinic due to Discrepancy between home and clinic BP and 24-hour blood pressure monitor -Labs: BMP, urine microalbumin/creatinine 7/17 -BP log added to patient's AVS -Follow up in 2 weeks

## 2023-07-14 ENCOUNTER — Encounter: Payer: Self-pay | Admitting: Orthopedic Surgery

## 2023-07-15 NOTE — Progress Notes (Signed)
SDW call to patient to update with surgery date and time for 07/17/2023 at 0730.  States last dose of Ozempic was 07/10/2023.  No further questions from PAT information at this time.

## 2023-07-16 ENCOUNTER — Encounter: Payer: Self-pay | Admitting: Podiatry

## 2023-07-16 ENCOUNTER — Telehealth: Payer: Self-pay

## 2023-07-16 ENCOUNTER — Encounter: Payer: Self-pay | Admitting: Physical Medicine and Rehabilitation

## 2023-07-16 DIAGNOSIS — Z419 Encounter for procedure for purposes other than remedying health state, unspecified: Secondary | ICD-10-CM | POA: Diagnosis not present

## 2023-07-16 DIAGNOSIS — M5416 Radiculopathy, lumbar region: Secondary | ICD-10-CM

## 2023-07-16 NOTE — Addendum Note (Signed)
Addended by: Ashok Norris on: 07/16/2023 02:34 PM   Modules accepted: Orders

## 2023-07-16 NOTE — Telephone Encounter (Signed)
Patient came into clinic and requested an injection. Last injection 03/26/23 lasted until a few weeks ago and she had great relief from it. No new falls, accidents or injuries. Please advise

## 2023-07-16 NOTE — Telephone Encounter (Signed)
The patient called today to request a repeat injection in her lower back. Her previous esi on 03/26/23 improved her lower back pain and everyday functionality about 75%. This lasted until a couple weeks ago. There has not been any injury/fall/accident  that brought on this pain. The pain just started going down her right leg, causing her to walk with a limp again. The patient describes her pain as an 8 out of 1-10 scale. Since her last injection (right L4 transforaminal epidural steroid injection) the patient reports she has continued to take her morning walks daily and taking Tylenol and Meloxicam daily. Despite these efforts, the pain returned prompting her call today.

## 2023-07-16 NOTE — Telephone Encounter (Signed)
Ok repeat  Right L4 transforaminal esi. I put in referral

## 2023-07-17 ENCOUNTER — Encounter (HOSPITAL_COMMUNITY): Admission: RE | Payer: Self-pay | Source: Home / Self Care

## 2023-07-17 ENCOUNTER — Ambulatory Visit (HOSPITAL_COMMUNITY): Admission: RE | Admit: 2023-07-17 | Payer: Medicaid Other | Source: Home / Self Care | Admitting: Orthopedic Surgery

## 2023-07-17 SURGERY — TOTAL KNEE ARTHROPLASTY
Anesthesia: Choice | Site: Knee | Laterality: Right

## 2023-07-20 ENCOUNTER — Telehealth: Payer: Self-pay

## 2023-07-20 NOTE — Telephone Encounter (Signed)
I called the patient to have her clarify her ongoing active conservative treatment:  The patient reports that since her last injection on 03/26/23, she has continued to take her morning walks, usually daily, for about 1 mile or so. Prior to and after the walks, she does the back and leg stretches as directed by the physician. She has been unable to walk as far or as often over the past 2 weeks, since the flareup of her right lower back and radicular pain down her right leg.

## 2023-07-22 ENCOUNTER — Ambulatory Visit (INDEPENDENT_AMBULATORY_CARE_PROVIDER_SITE_OTHER): Payer: Medicaid Other | Admitting: Podiatry

## 2023-07-22 ENCOUNTER — Encounter: Payer: Self-pay | Admitting: Podiatry

## 2023-07-22 DIAGNOSIS — M659 Synovitis and tenosynovitis, unspecified: Secondary | ICD-10-CM

## 2023-07-22 DIAGNOSIS — M7751 Other enthesopathy of right foot: Secondary | ICD-10-CM | POA: Diagnosis not present

## 2023-07-22 MED ORDER — TRIAMCINOLONE ACETONIDE 10 MG/ML IJ SUSP
10.0000 mg | Freq: Once | INTRAMUSCULAR | Status: AC
Start: 2023-07-22 — End: 2023-07-22
  Administered 2023-07-22: 10 mg via INTRA_ARTICULAR

## 2023-07-22 NOTE — Progress Notes (Signed)
Subjective:   Patient ID: Sandra Lozano, female   DOB: 62 y.o.   MRN: [id number]   HPI Patient states she was not able to get her knee replacement done not sure when she will be doing that and she is having a lot of pain in her right ankle   ROS      Objective:  Physical Exam  Neurovascular status intact with patient found to have inflammation pain in the right sinus tarsi with fluid buildup      Assessment:  Inflammatory capsulitis of the right sinus tarsi     Plan:  Reviewed I have recommended that with this needs to be fixed I explained procedure and I went ahead today and we will do injection treatment understanding risk.  Sterile prep injected the sinus tarsi 3 mg Kenalog 5 mg Xylocaine reappoint as needed

## 2023-07-23 ENCOUNTER — Encounter: Payer: Medicaid Other | Admitting: Orthopedic Surgery

## 2023-07-27 ENCOUNTER — Encounter: Payer: Self-pay | Admitting: Orthopedic Surgery

## 2023-07-27 ENCOUNTER — Ambulatory Visit: Payer: Medicaid Other | Admitting: Podiatry

## 2023-07-30 ENCOUNTER — Other Ambulatory Visit: Payer: Self-pay | Admitting: Family Medicine

## 2023-07-30 DIAGNOSIS — E785 Hyperlipidemia, unspecified: Secondary | ICD-10-CM

## 2023-07-31 ENCOUNTER — Encounter: Payer: Self-pay | Admitting: Pharmacist

## 2023-07-31 ENCOUNTER — Other Ambulatory Visit: Payer: Self-pay | Admitting: Family Medicine

## 2023-07-31 ENCOUNTER — Ambulatory Visit (INDEPENDENT_AMBULATORY_CARE_PROVIDER_SITE_OTHER): Payer: Medicaid Other | Admitting: Pharmacist

## 2023-07-31 VITALS — BP 174/91 | Ht 64.0 in | Wt 190.6 lb

## 2023-07-31 DIAGNOSIS — I10 Essential (primary) hypertension: Secondary | ICD-10-CM | POA: Diagnosis not present

## 2023-07-31 DIAGNOSIS — M25512 Pain in left shoulder: Secondary | ICD-10-CM

## 2023-07-31 MED ORDER — DICLOFENAC SODIUM 1 % EX GEL
CUTANEOUS | 2 refills | Status: DC
Start: 2023-07-31 — End: 2023-12-04

## 2023-07-31 NOTE — Progress Notes (Signed)
S:     Chief Complaint  Patient presents with   Medication Management    Amb BP - Day 1   62 y.o. female who presents for hypertension evaluation, education, and management.  PMH is significant for Joint pain, elevated blood glucose, hypertension.  Patient was referred and last seen by Primary Care Provider, Dr. Royal Piedra, on 07/13/2023.   At last visit, blood pressure elevations were reported with home monitoring despite use of blood pressure medication.   Diagnosed with Hypertension in the year of 2019.    Medication compliance is reported to be good.  Discussed procedure for wearing the monitor and gave patient written instructions. Monitor was placed on non-dominant arm with instructions to return in the morning.   Current BP Medications include:  lisinopril 40mg  / hydrochlorothiazide 25mg  (taken as 2 x 20/12.5 mg tablets)   O:  Review of Systems  Musculoskeletal:  Positive for back pain and joint pain (right knee - pending replacement).  All other systems reviewed and are negative. Pain today rated at 6/10  Physical Exam Vitals reviewed.  Constitutional:      Appearance: Normal appearance.  Pulmonary:     Effort: Pulmonary effort is normal.  Neurological:     Mental Status: She is alert.  Psychiatric:        Mood and Affect: Mood normal.        Thought Content: Thought content normal.        Judgment: Judgment normal.     Last 3 Office BP readings: BP Readings from Last 3 Encounters:  07/31/23 (!) 174/91  07/13/23 (!) 144/64  07/09/23 (!) 182/80    Clinical Atherosclerotic Cardiovascular Disease (ASCVD): No  The 10-year ASCVD risk score (Arnett DK, et al., 2019) is: 31.7%   Values used to calculate the score:     Age: 68 years     Sex: Female     Is Non-Hispanic African American: Yes     Diabetic: Yes     Tobacco smoker: No     Systolic Blood Pressure: 174 mmHg     Is BP treated: Yes     HDL Cholesterol: 44 mg/dL     Total Cholesterol: 150  mg/dL  Basic Metabolic Panel    Component Value Date/Time   NA 136 07/01/2023 0906   NA 138 12/18/2022 1108   K 4.2 07/01/2023 0906   CL 105 07/01/2023 0906   CO2 27 07/01/2023 0906   GLUCOSE 95 07/01/2023 0906   BUN 19 07/01/2023 0906   BUN 24 12/18/2022 1108   CREATININE 0.72 07/01/2023 0906   CALCIUM 9.1 07/01/2023 0906   GFRNONAA >60 07/01/2023 0906   GFRAA 111 08/22/2020 1205    Renal function: CrCl cannot be calculated (Patient's most recent lab result is older than the maximum 21 days allowed.).   ABPM Study Data: Arm Placement left arm  For Office Goal Goal BP of <130/80:  ABPM thresholds: Overall BP < 125/75, daytime BP <130/80 mmHg, sleeptime BP <110/65 mmHg      A/P: History of hypertension for ~ 5 years and currently taking; lisinopril 40mg  with hydrochlorothiazide 25mg .  Goal presssure of <130/80 mmHg.     -Placed blood pressure cuff, provided education, patient instructed to wear cuff for 24 hours and return tomorrow to review results.  Refill request for diclofenac gel in patient with back and knee pain (knee replacement pending). New prescription provided.     Written patient instructions provided including activity/symptom/event log. Patient verbalized understanding  of plan. Total time in face to face counseling 22 minutes.    Follow-up: Monday 8/19  8:30 AM - early morning appointment

## 2023-07-31 NOTE — Patient Instructions (Signed)
Blood Pressure Activity Diary Time Lying down/ Sleeping Walking/ Exercise Stressed/ Angry Headache/ Pain Dizzy  9 AM       10 AM       11 AM       12 PM       1 PM       2 PM       Time Lying down/ Sleeping Walking/ Exercise Stressed/ Angry Headache/ Pain Dizzy  3 PM       4 PM        5 PM       6 PM       7 PM       8 PM       Time Lying down/ Sleeping Walking/ Exercise Stressed/ Angry Headache/ Pain Dizzy  9 PM       10 PM       11 PM       12 AM       1 AM       2 AM       3 AM       Time Lying down/ Sleeping Walking/ Exercise Stressed/ Angry Headache/ Pain Dizzy  4 AM       5 AM       6 AM       7 AM       8 AM       9 AM       10 AM        Time you woke up: _________                  Time you went to sleep:__________  Come back Monday morning at 8:30 to have the monitor removed  Call the [facility] if you have any questions before then [phone])  Wearing the Blood Pressure Monitor The cuff will inflate every 20 minutes during the day and every 30 minutes while you sleep. Fill out the blood pressure-activity diary during the day, especially during activities that may affect your reading -- such as exercise, stress, walking, taking your blood pressure medications  Important things to know: Avoid taking the monitor off for the next 24 hours, unless it causes you discomfort or pain. Do NOT get the monitor wet and do NOT try to clean the monitor with any cleaning products. Do NOT put the monitor on anyone else's arm. When the cuff inflates, avoid excess movement. Let the cuffed arm hang loosely, slightly away from the body. Avoid flexing the muscles or moving the hand/fingers. Remember to fill out the blood pressure activity diary. If you experience severe pain or unusual pain (not associated with getting your blood pressure checked), remove the monitor.  Troubleshooting:  Code  Troubleshooting   1  Check cuff position, tighten cuff   2, 3  Remain  still during reading   4, 87  Check air hose connections and make sure cuff is tight   85, 89  Check hose connections and make tubing is not crimped   86  Push START/STOP to restart reading   88, 91  Retry by pushing START/STOP   90  Replace batteries. If problem persists, remove monitor and bring back to   clinic at follow up   97, 98, 99  Service required - Remove monitor and bring back to clinic at follow up

## 2023-07-31 NOTE — Telephone Encounter (Signed)
Patient is requesting refill of:  Name of Medication(s):  Diclofenac Sodium Last date of OV:  07/13/23 Pharmacy:  Same  Will route refill request to Clinic RN.  Discussed with patient policy to call pharmacy for future refills.  Also, discussed refills may take up to 48 hours to approve or deny.  Sandra Lozano

## 2023-07-31 NOTE — Assessment & Plan Note (Signed)
History of hypertension for ~ 5 years and currently taking; lisinopril 40mg  with hydrochlorothiazide 25mg .  Goal presssure of <130/80 mmHg.     -Placed blood pressure cuff, provided education, patient instructed to wear cuff for 24 hours and return tomorrow to review results.

## 2023-08-01 NOTE — Progress Notes (Signed)
Reviewed and agree with Dr Koval's plan.   

## 2023-08-03 ENCOUNTER — Other Ambulatory Visit: Payer: Self-pay

## 2023-08-03 ENCOUNTER — Ambulatory Visit: Payer: Medicaid Other | Admitting: Pharmacist

## 2023-08-03 ENCOUNTER — Ambulatory Visit (INDEPENDENT_AMBULATORY_CARE_PROVIDER_SITE_OTHER): Payer: Medicaid Other | Admitting: Physical Medicine and Rehabilitation

## 2023-08-03 ENCOUNTER — Telehealth: Payer: Self-pay | Admitting: Pharmacist

## 2023-08-03 VITALS — BP 116/71 | HR 78

## 2023-08-03 DIAGNOSIS — M5416 Radiculopathy, lumbar region: Secondary | ICD-10-CM | POA: Diagnosis not present

## 2023-08-03 MED ORDER — METHYLPREDNISOLONE ACETATE 80 MG/ML IJ SUSP
80.0000 mg | Freq: Once | INTRAMUSCULAR | Status: AC
  Administered 2023-08-03: 80 mg

## 2023-08-03 NOTE — Patient Instructions (Signed)

## 2023-08-03 NOTE — Progress Notes (Signed)
Functional Pain Scale - descriptive words and definitions  Uncomfortable (3)  Pain is present but can complete all ADL's/sleep is slightly affected and passive distraction only gives marginal relief. Mild range order  Average Pain 2   +Driver, -BT, -Dye Allergies.  Lower back pain on right that radiates into right hip. Pain mostly when walking

## 2023-08-03 NOTE — Telephone Encounter (Signed)
   Patient was unable to keep in-person Appt.  Monitor was dropped off at the clinic. Results were communicated via telephone.  Chief Complaint  Patient presents with   Medication Management    Amb BP Monitoring - Results Communication   62 y.o. female who presents for hypertension evaluation, education, and management.  Telephone Call to patient. Patient reports they were able to wear the Ambulatory Blood Pressure Cuff for the entire 24 evaluation period.   O:   Last 3 Office BP readings: BP Readings from Last 3 Encounters:  07/31/23 (!) 174/91  07/13/23 (!) 144/64  07/09/23 (!) 182/80    Clinical Atherosclerotic Cardiovascular Disease (ASCVD): No  The 10-year ASCVD risk score (Arnett DK, et al., 2019) is: 31.7%   Values used to calculate the score:     Age: 38 years     Sex: Female     Is Non-Hispanic African American: Yes     Diabetic: Yes     Tobacco smoker: No     Systolic Blood Pressure: 174 mmHg     Is BP treated: Yes     HDL Cholesterol: 44 mg/dL     Total Cholesterol: 150 mg/dL  Basic Metabolic Panel    Component Value Date/Time   NA 136 07/01/2023 0906   NA 138 12/18/2022 1108   K 4.2 07/01/2023 0906   CL 105 07/01/2023 0906   CO2 27 07/01/2023 0906   GLUCOSE 95 07/01/2023 0906   BUN 19 07/01/2023 0906   BUN 24 12/18/2022 1108   CREATININE 0.72 07/01/2023 0906   CALCIUM 9.1 07/01/2023 0906   GFRNONAA >60 07/01/2023 0906   GFRAA 111 08/22/2020 1205    Renal function: CrCl cannot be calculated (Patient's most recent lab result is older than the maximum 21 days allowed.).   ABPM Study Data: Arm Placement left arm  Overall Mean 24hr BP:   120/62 mmHg  HR: 71  Daytime Mean BP:  123/65 mmHg  HR: 75  Nighttime Mean BP:  113/56 mmHg  HR: 63  Dipping Pattern: Mixed  Sys:   7.4%   Dia: 14.2%   [normal dipping ~10-20%]   For Office Goal Goal BP of <130/80:  ABPM thresholds: Overall BP < 125/75, daytime BP <130/80 mmHg, sleeptime BP <110/65 mmHg   Patient is participating in a Managed Medicaid Plan:  Yes   A/P: History of hypertension for ~ 5 years and currently taking; lisinopril/hydrochlorothiazide 40/12.5mg  daily.  Goal presssure of <130/80 mmHg.  Found to have normal blood pressure with 24-hour ambulatory blood pressure evaluation which demonstrates an average AWAKE blood pressure of 123/65 mmHg. It is noteworthy that patient had 26 of 52 readings at <120/70 mmHg. Nocturnal dipping pattern is near normal.   Changes to medications -Continue lisinopril/hydrochlorothiazide 40/12.5mg  daily.  Results reviewed and written information provided.    At the end of the conversation, patient verbalized understanding of  results and treatment plan.  Total time in conversation and documentation 18 minutes.    Follow-up:  Pharmacist PRN PCP clinic visit with Dr. Marsh Dolly as needed.

## 2023-08-03 NOTE — Telephone Encounter (Signed)
Reviewed and agree with Dr Koval's plan.   

## 2023-08-10 ENCOUNTER — Ambulatory Visit: Payer: Self-pay | Admitting: Family Medicine

## 2023-08-12 NOTE — Pre-Procedure Instructions (Signed)
Surgical Instructions   Your procedure is scheduled on Friday, September 6th. Report to [facility] Main Entrance "A" at 05:30 A.M., then check in with the Admitting office. Any questions or running late day of surgery: call [phone]  Questions prior to your surgery date: call [phone], Monday-Friday, 8am-4pm. If you experience any cold or flu symptoms such as cough, fever, chills, shortness of breath, etc. between now and your scheduled surgery, please notify us at the above number.     Remember:  Do not eat after midnight the night before your surgery  You may drink clear liquids until 04:30 AM the morning of your surgery.   Clear liquids allowed are: Water, Non-Citrus Juices (without pulp), Carbonated Beverages, Clear Tea, Black Coffee Only (NO MILK, CREAM OR POWDERED CREAMER of any kind), and Gatorade.    Take these medicines the morning of surgery with A SIP OF WATER  RESTASIS    May take these medicines IF NEEDED: acetaminophen (TYLENOL)    One week prior to surgery, STOP taking any Aspirin (unless otherwise instructed by your surgeon) Aleve, Naproxen, Ibuprofen, Motrin, diclofenac Sodium (VOLTAREN) gel, meloxicam (MOBIC), Advil, Goody's, BC's, all herbal medications, fish oil, and non-prescription vitamins.  WHAT DO I DO ABOUT MY DIABETES MEDICATION?   Do not take metFORMIN (GLUCOPHAGE) the morning of surgery.  HOLD SEMAGLUTIDE FOR 7 DAYS PRIOR TO SURGERY. Last dose 8/23       HOW TO MANAGE YOUR DIABETES BEFORE AND AFTER SURGERY  Why is it important to control my blood sugar before and after surgery? Improving blood sugar levels before and after surgery helps healing and can limit problems. A way of improving blood sugar control is eating a healthy diet by:  Eating less sugar and carbohydrates  Increasing activity/exercise  Talking with your doctor about reaching your blood sugar goals High blood sugars (greater than 180 mg/dL) can raise your risk of  infections and slow your recovery, so you will need to focus on controlling your diabetes during the weeks before surgery. Make sure that the doctor who takes care of your diabetes knows about your planned surgery including the date and location.  How do I manage my blood sugar before surgery? Check your blood sugar at least 4 times a day, starting 2 days before surgery, to make sure that the level is not too high or low.  Check your blood sugar the morning of your surgery when you wake up and every 2 hours until you get to the Short Stay unit.  If your blood sugar is less than 70 mg/dL, you will need to treat for low blood sugar: Do not take insulin. Treat a low blood sugar (less than 70 mg/dL) with  cup of clear juice (cranberry or apple), 4 glucose tablets, OR glucose gel. Recheck blood sugar in 15 minutes after treatment (to make sure it is greater than 70 mg/dL). If your blood sugar is not greater than 70 mg/dL on recheck, call [phone] for further instructions. Report your blood sugar to the short stay nurse when you get to Short Stay.  If you are admitted to the hospital after surgery: Your blood sugar will be checked by the staff and you will probably be given insulin after surgery (instead of oral diabetes medicines) to make sure you have good blood sugar levels. The goal for blood sugar control after surgery is 80-180 mg/dL.  Do NOT Smoke (Tobacco/Vaping) for 24 hours prior to your procedure.  If you use a CPAP at night, you may bring your mask/headgear for your overnight stay.   You will be asked to remove any contacts, glasses, piercing's, hearing aid's, dentures/partials prior to surgery. Please bring cases for these items if needed.    Patients discharged the day of surgery will not be allowed to drive home, and someone needs to stay with them for 24 hours.  SURGICAL WAITING ROOM VISITATION Patients may have no more than 2 support people in the waiting  area - these visitors may rotate.   Pre-op nurse will coordinate an appropriate time for 1 ADULT support person, who may not rotate, to accompany patient in pre-op.  Children under the age of 34 must have an adult with them who is not the patient and must remain in the main waiting area with an adult.  If the patient needs to stay at the hospital during part of their recovery, the visitor guidelines for inpatient rooms apply.  Please refer to the [facility] website for the visitor guidelines for any additional information.   If you received a COVID test during your pre-op visit  it is requested that you wear a mask when out in public, stay away from anyone that may not be feeling well and notify your surgeon if you develop symptoms. If you have been in contact with anyone that has tested positive in the last 10 days please notify you surgeon.      Pre-operative 5 CHG Bathing Instructions   You can play a key role in reducing the risk of infection after surgery. Your skin needs to be as free of germs as possible. You can reduce the number of germs on your skin by washing with CHG (chlorhexidine gluconate) soap before surgery. CHG is an antiseptic soap that kills germs and continues to kill germs even after washing.   DO NOT use if you have an allergy to chlorhexidine/CHG or antibacterial soaps. If your skin becomes reddened or irritated, stop using the CHG and notify one of our RNs at [phone].   Please shower with the CHG soap starting 4 days before surgery using the following schedule:     Please keep in mind the following:  DO NOT shave, including legs and underarms, starting the day of your first shower.   You may shave your face at any point before/day of surgery.  Place clean sheets on your bed the day you start using CHG soap. Use a clean washcloth (not used since being washed) for each shower. DO NOT sleep with pets once you start using the CHG.   CHG Shower Instructions:   If you choose to wash your hair and private area, wash first with your normal shampoo/soap.  After you use shampoo/soap, rinse your hair and body thoroughly to remove shampoo/soap residue.  Turn the water OFF and apply about 3 tablespoons (45 ml) of CHG soap to a CLEAN washcloth.  Apply CHG soap ONLY FROM YOUR NECK DOWN TO YOUR TOES (washing for 3-5 minutes)  DO NOT use CHG soap on face, private areas, open wounds, or sores.  Pay special attention to the area where your surgery is being performed.  If you are having back surgery, having someone wash your back for you may be helpful. Wait 2 minutes after CHG soap is applied, then you may rinse off the CHG soap.  Pat dry with a clean towel  Put on clean  clothes/pajamas   If you choose to wear lotion, please use ONLY the CHG-compatible lotions on the back of this paper.   Additional instructions for the day of surgery: DO NOT APPLY any lotions, deodorants, cologne, or perfumes.   Do not bring valuables to the hospital. [facility] is not responsible for any belongings/valuables. Do not wear nail polish, gel polish, artificial nails, or any other type of covering on natural nails (fingers and toes) Do not wear jewelry or makeup Put on clean/comfortable clothes.  Please brush your teeth.  Ask your nurse before applying any prescription medications to the skin.     CHG Compatible Lotions   Aveeno Moisturizing lotion  Cetaphil Moisturizing Cream  Cetaphil Moisturizing Lotion  Clairol Herbal Essence Moisturizing Lotion, Dry Skin  Clairol Herbal Essence Moisturizing Lotion, Extra Dry Skin  Clairol Herbal Essence Moisturizing Lotion, Normal Skin  Curel Age Defying Therapeutic Moisturizing Lotion with Alpha Hydroxy  Curel Extreme Care Body Lotion  Curel Soothing Hands Moisturizing Hand Lotion  Curel Therapeutic Moisturizing Cream, Fragrance-Free  Curel Therapeutic Moisturizing Lotion, Fragrance-Free  Curel Therapeutic Moisturizing Lotion,  Original Formula  Eucerin Daily Replenishing Lotion  Eucerin Dry Skin Therapy Plus Alpha Hydroxy Crme  Eucerin Dry Skin Therapy Plus Alpha Hydroxy Lotion  Eucerin Original Crme  Eucerin Original Lotion  Eucerin Plus Crme Eucerin Plus Lotion  Eucerin TriLipid Replenishing Lotion  Keri Anti-Bacterial Hand Lotion  Keri Deep Conditioning Original Lotion Dry Skin Formula Softly Scented  Keri Deep Conditioning Original Lotion, Fragrance Free Sensitive Skin Formula  Keri Lotion Fast Absorbing Fragrance Free Sensitive Skin Formula  Keri Lotion Fast Absorbing Softly Scented Dry Skin Formula  Keri Original Lotion  Keri Skin Renewal Lotion Keri Silky Smooth Lotion  Keri Silky Smooth Sensitive Skin Lotion  Nivea Body Creamy Conditioning Oil  Nivea Body Extra Enriched Lotion  Nivea Body Original Lotion  Nivea Body Sheer Moisturizing Lotion Nivea Crme  Nivea Skin Firming Lotion  NutraDerm 30 Skin Lotion  NutraDerm Skin Lotion  NutraDerm Therapeutic Skin Cream  NutraDerm Therapeutic Skin Lotion  ProShield Protective Hand Cream  Provon moisturizing lotion  Please read over the following fact sheets that you were given.

## 2023-08-13 ENCOUNTER — Other Ambulatory Visit: Payer: Self-pay

## 2023-08-13 ENCOUNTER — Encounter (HOSPITAL_COMMUNITY): Payer: Self-pay

## 2023-08-13 ENCOUNTER — Encounter (HOSPITAL_COMMUNITY)
Admission: RE | Admit: 2023-08-13 | Discharge: 2023-08-13 | Disposition: A | Discharge status: Home / Self Care | Payer: Medicaid Other | Source: Ambulatory Visit | Attending: Orthopedic Surgery | Admitting: Orthopedic Surgery

## 2023-08-13 VITALS — BP 156/75 | HR 72 | Temp 98.0°F | Resp 17 | Ht 64.0 in | Wt 189.7 lb

## 2023-08-13 DIAGNOSIS — Z01812 Encounter for preprocedural laboratory examination: Secondary | ICD-10-CM | POA: Insufficient documentation

## 2023-08-13 DIAGNOSIS — M1711 Unilateral primary osteoarthritis, right knee: Secondary | ICD-10-CM | POA: Insufficient documentation

## 2023-08-13 DIAGNOSIS — Z01818 Encounter for other preprocedural examination: Secondary | ICD-10-CM

## 2023-08-13 LAB — SURGICAL PCR SCREEN
MRSA, PCR: NEGATIVE
Staphylococcus aureus: NEGATIVE

## 2023-08-13 LAB — CBC
HCT: 40.7 % (ref 36.0–46.0)
Hemoglobin: 13.1 g/dL (ref 12.0–15.0)
MCH: 28.7 pg (ref 26.0–34.0)
MCHC: 32.2 g/dL (ref 30.0–36.0)
MCV: 89.1 fL (ref 80.0–100.0)
Platelets: 272 10*3/uL (ref 150–400)
RBC: 4.57 MIL/uL (ref 3.87–5.11)
RDW: 13.2 % (ref 11.5–15.5)
WBC: 6 10*3/uL (ref 4.0–10.5)
nRBC: 0 % (ref 0.0–0.2)

## 2023-08-13 LAB — GLUCOSE, CAPILLARY: Glucose-Capillary: 101 mg/dL — ABNORMAL HIGH (ref 70–99)

## 2023-08-13 LAB — BASIC METABOLIC PANEL
Anion gap: 10 (ref 5–15)
BUN: 16 mg/dL (ref 8–23)
CO2: 27 mmol/L (ref 22–32)
Calcium: 9.5 mg/dL (ref 8.9–10.3)
Chloride: 100 mmol/L (ref 98–111)
Creatinine, Ser: 0.72 mg/dL (ref 0.44–1.00)
GFR, Estimated: >60 mL/min (ref >60)
Glucose, Bld: 88 mg/dL (ref 70–99)
Potassium: 3.8 mmol/L (ref 3.5–5.1)
Sodium: 137 mmol/L (ref 135–145)

## 2023-08-13 NOTE — Progress Notes (Signed)
PCP - Dr. Lockie Mola Cardiologist - denies  PPM/ICD - denies Device Orders - n/a Rep Notified - n/a  Chest x-ray - n/a EKG - 03/05/2023 Stress Test -  ECHO -  Cardiac Cath -   Sleep Study - denies CPAP - n/a  Pre-diabetic Fasting Blood Sugar - 96-101 Checks Blood Sugar daily  Last dose of GLP1 agonist-  Ozempic GLP1 instructions: Last dose 08/07/2023  Blood Thinner Instructions: denies Aspirin Instructions:denies  ERAS Protcol - Clear fluids until 0430  COVID TEST- n/a   Anesthesia review: No  Patient denies shortness of breath, fever, cough and chest pain at PAT appointment   All instructions explained to the patient, with a verbal understanding of the material. Patient agrees to go over the instructions while at home for a better understanding. Patient also instructed to self quarantine after being tested for COVID-19. The opportunity to ask questions was provided.

## 2023-08-14 ENCOUNTER — Encounter: Payer: Self-pay | Admitting: Family Medicine

## 2023-08-14 ENCOUNTER — Encounter: Payer: Self-pay | Admitting: Podiatry

## 2023-08-15 NOTE — Procedures (Signed)
Lumbosacral Transforaminal Epidural Steroid Injection - Sub-Pedicular Approach with Fluoroscopic Guidance  Patient: Sandra Lozano      Date of Birth: 1961/11/22 MRN: [id number] PCP: Lockie Mola, MD      Visit Date: 08/03/2023   Universal Protocol:    Date/Time: 08/03/2023  Consent Given By: the patient  Position: PRONE  Additional Comments: Vital signs were monitored before and after the procedure. Patient was prepped and draped in the usual sterile fashion. The correct patient, procedure, and site was verified.   Injection Procedure Details:   Procedure diagnoses: Lumbar radiculopathy [M54.16]    Meds Administered:  Meds ordered this encounter  Medications   methylPREDNISolone acetate (DEPO-MEDROL) injection 80 mg    Laterality: Right  Location/Site: L4  Needle:5.0 in., 22 ga.  Short bevel or Quincke spinal needle  Needle Placement: Transforaminal  Findings:    -Comments: Excellent flow of contrast along the nerve, nerve root and into the epidural space.  Procedure Details: After squaring off the end-plates to get a true AP view, the C-arm was positioned so that an oblique view of the foramen as noted above was visualized. The target area is just inferior to the "nose of the scotty dog" or sub pedicular. The soft tissues overlying this structure were infiltrated with 2-3 ml. of 1% Lidocaine without Epinephrine.  The spinal needle was inserted toward the target using a "trajectory" view along the fluoroscope beam.  Under AP and lateral visualization, the needle was advanced so it did not puncture dura and was located close the 6 O'Clock position of the pedical in AP tracterory. Biplanar projections were used to confirm position. Aspiration was confirmed to be negative for CSF and/or blood. A 1-2 ml. volume of Isovue-250 was injected and flow of contrast was noted at each level. Radiographs were obtained for documentation purposes.   After attaining the desired flow of  contrast documented above, a 0.5 to 1.0 ml test dose of 0.25% Marcaine was injected into each respective transforaminal space.  The patient was observed for 90 seconds post injection.  After no sensory deficits were reported, and normal lower extremity motor function was noted,   the above injectate was administered so that equal amounts of the injectate were placed at each foramen (level) into the transforaminal epidural space.   Additional Comments:  No complications occurred Dressing: 2 x 2 sterile gauze and Band-Aid    Post-procedure details: Patient was observed during the procedure. Post-procedure instructions were reviewed.  Patient left the clinic in stable condition.

## 2023-08-15 NOTE — Progress Notes (Signed)
[name] - 62 y.o. female MRN [id number]  Date of birth: October 29, 1961  Office Visit Note: Visit Date: 08/03/2023 PCP: Lockie Mola, MD Referred by: Lockie Mola, MD  Subjective: Chief Complaint  Patient presents with   Lower Back - Pain   HPI:  Sandra Lozano is a 62 y.o. female who comes in today at the request of Ellin Goodie, FNP and Dr. Aldean Baker for planned Right L4-5 Lumbar Transforaminal epidural steroid injection with fluoroscopic guidance.  The patient has failed conservative care including home exercise, medications, time and activity modification.  This injection will be diagnostic and hopefully therapeutic.  Please see requesting physician notes for further details and justification.   ROS Otherwise per HPI.  Assessment & Plan: Visit Diagnoses:    ICD-10-CM   1. Lumbar radiculopathy  M54.16 XR C-ARM NO REPORT    Epidural Steroid injection    methylPREDNISolone acetate (DEPO-MEDROL) injection 80 mg      Plan: No additional findings.   Meds & Orders:  Meds ordered this encounter  Medications   methylPREDNISolone acetate (DEPO-MEDROL) injection 80 mg    Orders Placed This Encounter  Procedures   XR C-ARM NO REPORT   Epidural Steroid injection    Follow-up: Return for visit to requesting provider as needed.   Procedures: No procedures performed  Lumbosacral Transforaminal Epidural Steroid Injection - Sub-Pedicular Approach with Fluoroscopic Guidance  Patient: Sandra Lozano      Date of Birth: 20-Feb-1961 MRN: [id number] PCP: Lockie Mola, MD      Visit Date: 08/03/2023   Universal Protocol:    Date/Time: 08/03/2023  Consent Given By: the patient  Position: PRONE  Additional Comments: Vital signs were monitored before and after the procedure. Patient was prepped and draped in the usual sterile fashion. The correct patient, procedure, and site was verified.   Injection Procedure Details:   Procedure diagnoses: Lumbar radiculopathy  [M54.16]    Meds Administered:  Meds ordered this encounter  Medications   methylPREDNISolone acetate (DEPO-MEDROL) injection 80 mg    Laterality: Right  Location/Site: L4  Needle:5.0 in., 22 ga.  Short bevel or Quincke spinal needle  Needle Placement: Transforaminal  Findings:    -Comments: Excellent flow of contrast along the nerve, nerve root and into the epidural space.  Procedure Details: After squaring off the end-plates to get a true AP view, the C-arm was positioned so that an oblique view of the foramen as noted above was visualized. The target area is just inferior to the "nose of the scotty dog" or sub pedicular. The soft tissues overlying this structure were infiltrated with 2-3 ml. of 1% Lidocaine without Epinephrine.  The spinal needle was inserted toward the target using a "trajectory" view along the fluoroscope beam.  Under AP and lateral visualization, the needle was advanced so it did not puncture dura and was located close the 6 O'Clock position of the pedical in AP tracterory. Biplanar projections were used to confirm position. Aspiration was confirmed to be negative for CSF and/or blood. A 1-2 ml. volume of Isovue-250 was injected and flow of contrast was noted at each level. Radiographs were obtained for documentation purposes.   After attaining the desired flow of contrast documented above, a 0.5 to 1.0 ml test dose of 0.25% Marcaine was injected into each respective transforaminal space.  The patient was observed for 90 seconds post injection.  After no sensory deficits were reported, and normal lower extremity motor function was noted,   the above  injectate was administered so that equal amounts of the injectate were placed at each foramen (level) into the transforaminal epidural space.   Additional Comments:  No complications occurred Dressing: 2 x 2 sterile gauze and Band-Aid    Post-procedure details: Patient was observed during the  procedure. Post-procedure instructions were reviewed.  Patient left the clinic in stable condition.    Clinical History: EXAM: MRI LUMBAR SPINE WITHOUT CONTRAST   TECHNIQUE: Multiplanar, multisequence MR imaging of the lumbar spine was performed. No intravenous contrast was administered.   COMPARISON:  Lumbar radiographs 09/28/2018   FINDINGS: Segmentation:  Normal   Alignment:  7 mm anterolisthesis L4-5.  Remaining alignment normal   Vertebrae: Negative for fracture or mass. Bone marrow edema at L4-5 related to discogenic change.   Conus medullaris and cauda equina: Conus extends to the L1-2 level. Conus and cauda equina appear normal.   Paraspinal and other soft tissues: Negative for paraspinous mass or fluid collection   Disc levels:   L1-2: Negative   L2-3: Negative   L3-4: Mild facet degeneration. Negative for disc protrusion or stenosis   L4-5: 7 mm anterolisthesis. Severe facet degeneration bilaterally. Severe disc degeneration with endplate erosive change and edema. Severe spinal stenosis. Severe subarticular foraminal stenosis left greater than right   L5-S1: Negative   IMPRESSION: Severe spinal stenosis L4-5 with severe subarticular foraminal stenosis bilaterally left greater than right. Grade 1 anterolisthesis L4-5. Remaining lumbar spine negative     Electronically Signed   By: Marlan Palau M.D.   On: 11/13/2018 15:34     Objective:  VS:  HT:    WT:   BMI:     BP:116/71  HR:78bpm  TEMP: ( )  RESP:  Physical Exam Vitals and nursing note reviewed.  Constitutional:      General: She is not in acute distress.    Appearance: Normal appearance. She is not ill-appearing.  HENT:     Head: Normocephalic and atraumatic.     Right Ear: External ear normal.     Left Ear: External ear normal.  Eyes:     Extraocular Movements: Extraocular movements intact.  Cardiovascular:     Rate and Rhythm: Normal rate.     Pulses: Normal pulses.   Pulmonary:     Effort: Pulmonary effort is normal. No respiratory distress.  Abdominal:     General: There is no distension.     Palpations: Abdomen is soft.  Musculoskeletal:        General: Tenderness present.     Cervical back: Neck supple.     Right lower leg: No edema.     Left lower leg: No edema.     Comments: Patient has good distal strength with no pain over the greater trochanters.  No clonus or focal weakness.  Skin:    Findings: No erythema, lesion or rash.  Neurological:     General: No focal deficit present.     Mental Status: She is alert and oriented to person, place, and time.     Sensory: No sensory deficit.     Motor: No weakness or abnormal muscle tone.     Coordination: Coordination normal.  Psychiatric:        Mood and Affect: Mood normal.        Behavior: Behavior normal.      Imaging: No results found.

## 2023-08-16 DIAGNOSIS — Z419 Encounter for procedure for purposes other than remedying health state, unspecified: Secondary | ICD-10-CM | POA: Diagnosis not present

## 2023-08-20 NOTE — Anesthesia Preprocedure Evaluation (Addendum)
Anesthesia Evaluation  Patient identified by MRN, date of birth, ID band Patient awake    Reviewed: Allergy & Precautions, NPO status , Patient's Chart, lab work & pertinent test results  History of Anesthesia Complications Negative for: history of anesthetic complications  Airway Mallampati: I  TM Distance: >3 FB Neck ROM: Full    Dental  (+) Dental Advisory Given, Partial Lower, Partial Upper   Pulmonary COPD, Current Smoker and Patient abstained from smoking.   Pulmonary exam normal        Cardiovascular hypertension, Pt. on medications Normal cardiovascular exam     Neuro/Psych negative neurological ROS  negative psych ROS   GI/Hepatic negative GI ROS,,,(+)     substance abuse  marijuana use  Endo/Other   Obesity Pre-DM On GLP-1a, last dose ~ 2 wks ago    Renal/GU negative Renal ROS     Musculoskeletal  (+) Arthritis ,    Abdominal   Peds  Hematology negative hematology ROS (+)   Anesthesia Other Findings   Reproductive/Obstetrics                             Anesthesia Physical Anesthesia Plan  ASA: 2  Anesthesia Plan: Spinal   Post-op Pain Management: Regional block* and Tylenol PO (pre-op)*   Induction:   PONV Risk Score and Plan: 1 and Treatment may vary due to age or medical condition and Propofol infusion  Airway Management Planned: Natural Airway and Simple Face Mask  Additional Equipment: None  Intra-op Plan:   Post-operative Plan:   Informed Consent: I have reviewed the patients History and Physical, chart, labs and discussed the procedure including the risks, benefits and alternatives for the proposed anesthesia with the patient or authorized representative who has indicated his/her understanding and acceptance.       Plan Discussed with: CRNA and Anesthesiologist  Anesthesia Plan Comments: (Labs reviewed, platelets acceptable. Discussed risks and  benefits of spinal, including spinal/epidural hematoma, infection, failed block, and PDPH. Patient expressed understanding and wished to proceed. )       Anesthesia Quick Evaluation

## 2023-08-21 ENCOUNTER — Observation Stay (HOSPITAL_COMMUNITY)
Admission: RE | Admit: 2023-08-21 | Discharge: 2023-08-22 | Disposition: A | Discharge status: Home / Self Care | Payer: Medicaid Other | Source: Ambulatory Visit | Attending: Orthopedic Surgery | Admitting: Orthopedic Surgery

## 2023-08-21 ENCOUNTER — Encounter (HOSPITAL_COMMUNITY)
Admission: RE | Disposition: A | Discharge status: Home / Self Care | Payer: Self-pay | Source: Ambulatory Visit | Attending: Orthopedic Surgery

## 2023-08-21 ENCOUNTER — Other Ambulatory Visit: Payer: Self-pay

## 2023-08-21 ENCOUNTER — Ambulatory Visit (HOSPITAL_COMMUNITY): Payer: Medicaid Other | Admitting: Anesthesiology

## 2023-08-21 ENCOUNTER — Encounter (HOSPITAL_COMMUNITY): Payer: Self-pay | Admitting: Orthopedic Surgery

## 2023-08-21 DIAGNOSIS — M1711 Unilateral primary osteoarthritis, right knee: Secondary | ICD-10-CM

## 2023-08-21 DIAGNOSIS — G8918 Other acute postprocedural pain: Secondary | ICD-10-CM | POA: Diagnosis not present

## 2023-08-21 DIAGNOSIS — J449 Chronic obstructive pulmonary disease, unspecified: Secondary | ICD-10-CM | POA: Diagnosis not present

## 2023-08-21 DIAGNOSIS — I10 Essential (primary) hypertension: Secondary | ICD-10-CM | POA: Insufficient documentation

## 2023-08-21 DIAGNOSIS — Z96651 Presence of right artificial knee joint: Principal | ICD-10-CM

## 2023-08-21 DIAGNOSIS — Z87891 Personal history of nicotine dependence: Secondary | ICD-10-CM | POA: Diagnosis not present

## 2023-08-21 HISTORY — PX: TOTAL KNEE ARTHROPLASTY: SHX125

## 2023-08-21 LAB — GLUCOSE, CAPILLARY: Glucose-Capillary: 103 mg/dL — ABNORMAL HIGH (ref 70–99)

## 2023-08-21 SURGERY — ARTHROPLASTY, KNEE, TOTAL
Anesthesia: Spinal | Site: Knee | Laterality: Right

## 2023-08-21 MED ORDER — EPHEDRINE SULFATE-NACL 50-0.9 MG/10ML-% IV SOSY
PREFILLED_SYRINGE | INTRAVENOUS | Status: DC | PRN
  Administered 2023-08-21 (×2): 5 mg via INTRAVENOUS

## 2023-08-21 MED ORDER — TRANEXAMIC ACID 1000 MG/10ML IV SOLN
2000.0000 mg | Freq: Once | INTRAVENOUS | Status: AC
  Administered 2023-08-21: 2000 mg via TOPICAL
  Filled 2023-08-21: qty 20

## 2023-08-21 MED ORDER — MIDAZOLAM HCL 2 MG/2ML IJ SOLN
INTRAMUSCULAR | Status: DC | PRN
  Administered 2023-08-21: 2 mg via INTRAVENOUS

## 2023-08-21 MED ORDER — FENTANYL CITRATE (PF) 250 MCG/5ML IJ SOLN
INTRAMUSCULAR | Status: AC
  Filled 2023-08-21: qty 5

## 2023-08-21 MED ORDER — ORAL CARE MOUTH RINSE: 15.0000 mL | Freq: Once | OROMUCOSAL | Status: AC

## 2023-08-21 MED ORDER — ONDANSETRON HCL 4 MG/2ML IJ SOLN
INTRAMUSCULAR | Status: AC
  Filled 2023-08-21: qty 2

## 2023-08-21 MED ORDER — LACTATED RINGERS IV SOLN: INTRAVENOUS | Status: DC

## 2023-08-21 MED ORDER — DEXAMETHASONE SODIUM PHOSPHATE 10 MG/ML IJ SOLN
INTRAMUSCULAR | Status: AC
  Filled 2023-08-21: qty 1

## 2023-08-21 MED ORDER — DEXAMETHASONE SODIUM PHOSPHATE 10 MG/ML IJ SOLN
INTRAMUSCULAR | Status: DC | PRN
  Administered 2023-08-21: 10 mg via INTRAVENOUS

## 2023-08-21 MED ORDER — ROPIVACAINE HCL 7.5 MG/ML IJ SOLN
INTRAMUSCULAR | Status: DC | PRN
Start: 2023-08-21 — End: 2023-08-21
  Administered 2023-08-21: 20 mL via PERINEURAL

## 2023-08-21 MED ORDER — SODIUM CHLORIDE 0.9 % IR SOLN
Status: DC | PRN
  Administered 2023-08-21: 3000 mL

## 2023-08-21 MED ORDER — OXYCODONE HCL 5 MG PO TABS
5.0000 mg | ORAL_TABLET | ORAL | Status: DC | PRN
  Administered 2023-08-22: 10 mg via ORAL

## 2023-08-21 MED ORDER — ACETAMINOPHEN 500 MG PO TABS: 1000.0000 mg | ORAL_TABLET | Freq: Once | ORAL | Status: AC

## 2023-08-21 MED ORDER — PROPOFOL 10 MG/ML IV BOLUS
INTRAVENOUS | Status: DC | PRN
  Administered 2023-08-21 (×2): 30 mg via INTRAVENOUS
  Administered 2023-08-21: 100 ug/kg/min via INTRAVENOUS

## 2023-08-21 MED ORDER — FENTANYL CITRATE (PF) 250 MCG/5ML IJ SOLN
INTRAMUSCULAR | Status: DC | PRN
  Administered 2023-08-21 (×2): 50 ug via INTRAVENOUS

## 2023-08-21 MED ORDER — MIDAZOLAM HCL 2 MG/2ML IJ SOLN
INTRAMUSCULAR | Status: AC
  Filled 2023-08-21: qty 2

## 2023-08-21 MED ORDER — HYDROMORPHONE HCL 1 MG/ML IJ SOLN
0.5000 mg | INTRAMUSCULAR | Status: DC | PRN
  Administered 2023-08-21 (×2): 1 mg via INTRAVENOUS
  Administered 2023-08-21: 0.5 mg via INTRAVENOUS
  Administered 2023-08-22: 1 mg via INTRAVENOUS
  Filled 2023-08-21 (×3): qty 1

## 2023-08-21 MED ORDER — OXYCODONE HCL 5 MG PO TABS
ORAL_TABLET | ORAL | Status: AC
  Filled 2023-08-21: qty 1

## 2023-08-21 MED ORDER — 0.9 % SODIUM CHLORIDE (POUR BTL) OPTIME
TOPICAL | Status: DC | PRN
  Administered 2023-08-21: 1000 mL

## 2023-08-21 MED ORDER — ACETAMINOPHEN 500 MG PO TABS
ORAL_TABLET | ORAL | Status: AC
  Administered 2023-08-21: 1000 mg via ORAL
  Filled 2023-08-21: qty 2

## 2023-08-21 MED ORDER — PHENYLEPHRINE 80 MCG/ML (10ML) SYRINGE FOR IV PUSH (FOR BLOOD PRESSURE SUPPORT)
PREFILLED_SYRINGE | INTRAVENOUS | Status: DC | PRN
  Administered 2023-08-21: 80 ug via INTRAVENOUS

## 2023-08-21 MED ORDER — ONDANSETRON HCL 4 MG/2ML IJ SOLN
INTRAMUSCULAR | Status: DC | PRN
  Administered 2023-08-21: 4 mg via INTRAVENOUS

## 2023-08-21 MED ORDER — EPHEDRINE 5 MG/ML INJ
INTRAVENOUS | Status: AC
  Filled 2023-08-21: qty 5

## 2023-08-21 MED ORDER — PHENYLEPHRINE HCL-NACL 20-0.9 MG/250ML-% IV SOLN
INTRAVENOUS | Status: DC | PRN
  Administered 2023-08-21: 40 ug/min via INTRAVENOUS

## 2023-08-21 MED ORDER — VASOPRESSIN 20 UNIT/ML IV SOLN
INTRAVENOUS | Status: AC
  Filled 2023-08-21: qty 1

## 2023-08-21 MED ORDER — HYDROMORPHONE HCL 1 MG/ML IJ SOLN
INTRAMUSCULAR | Status: AC
  Filled 2023-08-21: qty 1

## 2023-08-21 MED ORDER — CEFAZOLIN SODIUM-DEXTROSE 2-4 GM/100ML-% IV SOLN
2.0000 g | INTRAVENOUS | Status: AC
  Administered 2023-08-21: 2 g via INTRAVENOUS

## 2023-08-21 MED ORDER — TRANEXAMIC ACID-NACL 1000-0.7 MG/100ML-% IV SOLN
INTRAVENOUS | Status: AC
  Filled 2023-08-21: qty 200

## 2023-08-21 MED ORDER — BUPIVACAINE IN DEXTROSE 0.75-8.25 % IT SOLN
INTRATHECAL | Status: DC | PRN
  Administered 2023-08-21: 1.6 mL via INTRATHECAL

## 2023-08-21 MED ORDER — OXYCODONE HCL 5 MG/5ML PO SOLN: 5.0000 mg | Freq: Once | ORAL | Status: AC | PRN

## 2023-08-21 MED ORDER — PROMETHAZINE HCL 25 MG/ML IJ SOLN: 6.2500 mg | INTRAMUSCULAR | Status: DC | PRN

## 2023-08-21 MED ORDER — CEFAZOLIN SODIUM-DEXTROSE 2-4 GM/100ML-% IV SOLN
INTRAVENOUS | Status: AC
  Filled 2023-08-21: qty 100

## 2023-08-21 MED ORDER — FENTANYL CITRATE (PF) 100 MCG/2ML IJ SOLN: 25.0000 ug | INTRAMUSCULAR | Status: DC | PRN

## 2023-08-21 MED ORDER — PHENYLEPHRINE 80 MCG/ML (10ML) SYRINGE FOR IV PUSH (FOR BLOOD PRESSURE SUPPORT)
PREFILLED_SYRINGE | INTRAVENOUS | Status: AC
  Filled 2023-08-21: qty 10

## 2023-08-21 MED ORDER — CHLORHEXIDINE GLUCONATE 0.12 % MT SOLN: 15.0000 mL | Freq: Once | OROMUCOSAL | Status: AC

## 2023-08-21 MED ORDER — OXYCODONE HCL 5 MG PO TABS
5.0000 mg | ORAL_TABLET | Freq: Once | ORAL | Status: AC | PRN
  Administered 2023-08-21: 5 mg via ORAL

## 2023-08-21 MED ORDER — CHLORHEXIDINE GLUCONATE 0.12 % MT SOLN
OROMUCOSAL | Status: AC
  Administered 2023-08-21: 15 mL via OROMUCOSAL
  Filled 2023-08-21: qty 15

## 2023-08-21 SURGICAL SUPPLY — 66 items
BAG COUNTER SPONGE SURGICOUNT (BAG) ×1 IMPLANT
BAG DECANTER FOR FLEXI CONT (MISCELLANEOUS) IMPLANT
BAG SPNG CNTER NS LX DISP (BAG) ×1
BLADE SAGITTAL 25.0X1.19X90 (BLADE) ×1 IMPLANT
BLADE SAW SGTL 13X75X1.27 (BLADE) ×1 IMPLANT
BLADE SURG 21 STRL SS (BLADE) ×2 IMPLANT
BNDG CMPR 5X6 CHSV STRCH STRL (GAUZE/BANDAGES/DRESSINGS) ×2
BNDG COHESIVE 6X5 TAN NS LF (GAUZE/BANDAGES/DRESSINGS) IMPLANT
BNDG COHESIVE 6X5 TAN ST LF (GAUZE/BANDAGES/DRESSINGS) ×2 IMPLANT
BNDG GAUZE DERMACEA FLUFF 4 (GAUZE/BANDAGES/DRESSINGS) ×1 IMPLANT
BNDG GZE DERMACEA 4 6PLY (GAUZE/BANDAGES/DRESSINGS) ×2
BOWL SMART MIX CTS (DISPOSABLE) ×1 IMPLANT
BSPLAT TIB D 2 PG STRL KN RT (Joint) ×1 IMPLANT
CEMENT BONE R 1X40 (Cement) ×2 IMPLANT
CNTNR URN SCR LID CUP LEK RST (MISCELLANEOUS) IMPLANT
COMP FEM PS KNEE NRW 7 RT (Joint) ×1 IMPLANT
COMP PATELLA 3 PEG 35 (Joint) ×1 IMPLANT
COMP TIB PERS SZ D RT (Joint) ×1 IMPLANT
COMPONENT FEM PS KNEE NRW 7 RT (Joint) IMPLANT
COMPONENT PATELLA 3 PEG 35 (Joint) IMPLANT
COMPONENT TIB PERS SZ D RT (Joint) IMPLANT
CONT SPEC 4OZ STRL OR WHT (MISCELLANEOUS) ×1
COOLER ICEMAN CLASSIC (MISCELLANEOUS) ×1 IMPLANT
COVER SURGICAL LIGHT HANDLE (MISCELLANEOUS) ×1 IMPLANT
CUFF TOURN SGL QUICK 34 (TOURNIQUET CUFF) ×1
CUFF TOURN SGL QUICK 42 (TOURNIQUET CUFF) IMPLANT
CUFF TRNQT CYL 34X4.125X (TOURNIQUET CUFF) ×1 IMPLANT
DRAPE EXTREMITY T 121X128X90 (DISPOSABLE) ×1 IMPLANT
DRAPE HALF SHEET 40X57 (DRAPES) ×2 IMPLANT
DRAPE U-SHAPE 47X51 STRL (DRAPES) ×1 IMPLANT
DRSG ADAPTIC 3X8 NADH LF (GAUZE/BANDAGES/DRESSINGS) ×1 IMPLANT
DURAPREP 26ML APPLICATOR (WOUND CARE) ×1 IMPLANT
ELECT REM PT RETURN 9FT ADLT (ELECTROSURGICAL) ×1
ELECTRODE REM PT RTRN 9FT ADLT (ELECTROSURGICAL) ×1 IMPLANT
FACESHIELD WRAPAROUND (MASK) ×1 IMPLANT
FACESHIELD WRAPAROUND OR TEAM (MASK) ×1 IMPLANT
GAUZE PAD ABD 8X10 STRL (GAUZE/BANDAGES/DRESSINGS) ×1 IMPLANT
GAUZE SPONGE 4X4 12PLY STRL (GAUZE/BANDAGES/DRESSINGS) ×1 IMPLANT
GLOVE BIOGEL PI IND STRL 9 (GLOVE) ×1 IMPLANT
GLOVE SURG ORTHO 9.0 STRL STRW (GLOVE) ×1 IMPLANT
GOWN STRL REUS W/ TWL XL LVL3 (GOWN DISPOSABLE) ×2 IMPLANT
GOWN STRL REUS W/TWL XL LVL3 (GOWN DISPOSABLE) ×2
HANDPIECE INTERPULSE COAX TIP (DISPOSABLE) ×1
INSERT TIB ASF VIV SZ 6-7 12H (Insert) IMPLANT
KIT BASIN OR (CUSTOM PROCEDURE TRAY) ×1 IMPLANT
KIT TURNOVER KIT B (KITS) ×1 IMPLANT
MANIFOLD NEPTUNE II (INSTRUMENTS) ×1 IMPLANT
NS IRRIG 1000ML POUR BTL (IV SOLUTION) ×1 IMPLANT
PACK TOTAL JOINT (CUSTOM PROCEDURE TRAY) ×1 IMPLANT
PAD ABD 8X10 STRL (GAUZE/BANDAGES/DRESSINGS) IMPLANT
PAD ARMBOARD 7.5X6 YLW CONV (MISCELLANEOUS) ×1 IMPLANT
PAD COLD SHLDR WRAP-ON (PAD) ×1 IMPLANT
PADDING CAST ABS COTTON 6X4 NS (CAST SUPPLIES) IMPLANT
PIN DRILL HDLS TROCAR 75 4PK (PIN) IMPLANT
SCREW FEMALE HEX FIX 25X2.5 (ORTHOPEDIC DISPOSABLE SUPPLIES) IMPLANT
SET HNDPC FAN SPRY TIP SCT (DISPOSABLE) ×1 IMPLANT
STAPLER VISISTAT 35W (STAPLE) ×1 IMPLANT
SUCTION TUBE FRAZIER 10FR DISP (SUCTIONS) IMPLANT
SUT VIC AB 0 CT1 27 (SUTURE) ×1
SUT VIC AB 0 CT1 27XBRD ANBCTR (SUTURE) ×1 IMPLANT
SUT VIC AB 0 CT1 36 (SUTURE) IMPLANT
SUT VIC AB 1 CTX 36 (SUTURE)
SUT VIC AB 1 CTX36XBRD ANBCTR (SUTURE) IMPLANT
TOWEL GREEN STERILE (TOWEL DISPOSABLE) ×1 IMPLANT
TOWEL GREEN STERILE FF (TOWEL DISPOSABLE) ×1 IMPLANT
TUBE SUCT ARGYLE STRL (TUBING) IMPLANT

## 2023-08-21 NOTE — Transfer of Care (Signed)
Immediate Anesthesia Transfer of Care Note  Patient: Sandra Lozano  Procedure(s) Performed: RIGHT TOTAL KNEE ARTHROPLASTY (Right: Knee)  Patient Location: PACU  Anesthesia Type:Regional and Spinal  Level of Consciousness: awake, alert , and oriented  Airway & Oxygen Therapy: Patient Spontanous Breathing  Post-op Assessment: Report given to RN and Post -op Vital signs reviewed and stable  Post vital signs: Reviewed and stable  Last Vitals:  Vitals Value Taken Time  BP 145/73 08/21/23 0909  Temp    Pulse 79 08/21/23 0911  Resp 21 08/21/23 0911  SpO2 100 % 08/21/23 0911  Vitals shown include unfiled device data.  Last Pain:  Vitals:   08/21/23 0615  TempSrc: Oral  PainSc:          Complications: There were no known notable events for this encounter.

## 2023-08-21 NOTE — Anesthesia Procedure Notes (Signed)
Spinal  Patient location during procedure: OR Start time: 08/21/2023 7:33 AM End time: 08/21/2023 7:37 AM Reason for block: surgical anesthesia Staffing Performed: anesthesiologist  Anesthesiologist: Beryle Lathe, MD Performed by: Beryle Lathe, MD Authorized by: Beryle Lathe, MD   Preanesthetic Checklist Completed: patient identified, IV checked, risks and benefits discussed, surgical consent, monitors and equipment checked, pre-op evaluation and timeout performed Spinal Block Patient position: sitting Prep: DuraPrep Patient monitoring: heart rate, cardiac monitor, continuous pulse ox and blood pressure Approach: midline Location: L4-5 Injection technique: single-shot Needle Needle type: Quincke  Needle gauge: 22 G Additional Notes Consent was obtained prior to the procedure with all questions answered and concerns addressed. Risks including, but not limited to, bleeding, infection, nerve damage, paralysis, failed block, inadequate analgesia, allergic reaction, high spinal, itching, and headache were discussed and the patient wished to proceed. Functioning IV was confirmed and monitors were applied. Sterile prep and drape, including hand hygiene, mask, and sterile gloves were used. The patient was positioned and the spine was prepped. The skin was anesthetized with lidocaine. Thick, scarred tissue from previous lumbar surgery made using 24ga Pencan difficult, so quickly changed to 22ga Quincke. Free flow of clear CSF was obtained prior to injecting local anesthetic into the CSF. The spinal needle aspirated freely following injection. The needle was carefully withdrawn. The patient tolerated the procedure well.   Leslye Peer, MD

## 2023-08-21 NOTE — Anesthesia Postprocedure Evaluation (Signed)
Anesthesia Post Note  Patient: Sandra Lozano  Procedure(s) Performed: RIGHT TOTAL KNEE ARTHROPLASTY (Right: Knee)     Patient location during evaluation: PACU Anesthesia Type: Spinal Level of consciousness: awake and alert Pain management: pain level controlled Vital Signs Assessment: post-procedure vital signs reviewed and stable Respiratory status: spontaneous breathing and respiratory function stable Cardiovascular status: blood pressure returned to baseline and stable Postop Assessment: spinal receding and no apparent nausea or vomiting Anesthetic complications: no   There were no known notable events for this encounter.  Last Vitals:  Vitals:   08/21/23 0945 08/21/23 1015  BP: (!) 148/62 (!) 158/66  Pulse: (!) 48 (!) 57  Resp: 12 14  Temp: 36.5 C   SpO2: 100% 100%    Last Pain:  Vitals:   08/21/23 0945  TempSrc:   PainSc: 0-No pain                 Beryle Lathe

## 2023-08-21 NOTE — H&P (Signed)
TOTAL KNEE ADMISSION H&P  Patient is being admitted for right total knee arthroplasty.  Subjective:  Chief Complaint:right knee pain.  HPI: Sandra Lozano, 62 y.o. female, has a history of pain and functional disability in the right knee due to arthritis and has failed non-surgical conservative treatments for greater than 12 weeks to includeNSAID's and/or analgesics, corticosteriod injections, use of assistive devices, weight reduction as appropriate, and activity modification.  Onset of symptoms was gradual, starting 8 years ago with gradually worsening course since that time. The patient noted no past surgery on the right knee(s).  Patient currently rates pain in the right knee(s) at 8 out of 10 with activity. Patient has night pain, worsening of pain with activity and weight bearing, pain that interferes with activities of daily living, pain with passive range of motion, crepitus, and joint swelling.  Patient has evidence of subchondral cysts, subchondral sclerosis, periarticular osteophytes, joint subluxation, and joint space narrowing by imaging studies. This patient has had avascular necrosis of the knee. There is no active infection.  Patient Active Problem List   Diagnosis Date Noted   Hidradenitis suppurativa of right axilla 06/03/2023   Vaginal irritation 02/21/2023   Skin lesion of back 02/21/2023   Tinea 12/18/2022   Left shoulder pain 08/25/2022   DJD (degenerative joint disease), multiple sites 08/06/2022   COPD, mild (HCC) 02/11/2022   Carpal tunnel syndrome, bilateral 12/21/2021   Arthritis of finger of left hand 11/26/2021   Traumatic arthritis of left foot    Lichen simplex chronicus 06/04/2020   Arthritis of midfoot 03/14/2020   History of lumbar fusion 03/14/2020   Well controlled diabetes mellitus (HCC) 11/07/2019   Nummular eczema 07/06/2019   Lumbar stenosis 05/20/2019   Acute left-sided low back pain without sciatica 07/18/2018   Osteoarthritis of right hip  06/03/2018   Chronic right shoulder pain 05/05/2018   Essential hypertension 02/15/2018   Past Medical History:  Diagnosis Date   Arthritis    right knee, lower back   Chest wall pain 04/20/2021   Dyspnea    very rare -tx with albuterol neb sol if needed   Hyperlipidemia    Hypertension    Intertrigo 08/06/2022   Pre-diabetes    per patient - does not check blood sugar   Right hip pain 05/05/2018   Rupture of anterior cruciate ligament of right knee 02/15/2018   Sprain of medial collateral ligament of right knee 02/15/2018   SVD (spontaneous vaginal delivery)    x 5 - only 2 living, 1 stillborn and 1 premature at 7 months demise,1 child deceased   Wears dentures    upper and lower    Past Surgical History:  Procedure Laterality Date   FOOT ARTHRODESIS Left 11/02/2020   Procedure: LEFT MIDFOOT FUSION BASE 1ST AND 2ND METATARSAL;  Surgeon: Nadara Mustard, MD;  Location: [facility] OR;  Service: Orthopedics;  Laterality: Left;   LUMBAR FUSION  05/20/2019   GILL PROCEDURE, LEFT TRANSFORAMINAL LUMBAR INTERBODY FUSION, PEDICLE INSTRUMENTATION, BILATERAL FUSION (N/A   TUBAL LIGATION      Current Facility-Administered Medications  Medication Dose Route Frequency Provider Last Rate Last Admin   ceFAZolin (ANCEF) 2-4 GM/100ML-% IVPB            ceFAZolin (ANCEF) IVPB 2g/100 mL premix  2 g Intravenous On Call to OR Nadara Mustard, MD       lactated ringers infusion   Intravenous Continuous Mariann Barter, MD       No Known  Allergies  Social History   Tobacco Use   Smoking status: Former    Current packs/day: 0.00    Average packs/day: 1 pack/day for 30.0 years (30.0 ttl pk-yrs)    Types: Cigarettes    Start date: 01/1985    Quit date: 01/2015    Years since quitting: 8.6    Passive exposure: Past   Smokeless tobacco: Never  Substance Use Topics   Alcohol use: Yes    Comment: occ    Family History  Problem Relation Age of Onset   Cancer Sister    Healthy Daughter    Healthy  Daughter      Review of Systems  All other systems reviewed and are negative.   Objective:  Physical Exam  Vital signs in last 24 hours: Temp:  [98 F (36.7 C)] 98 F (36.7 C) (09/06 0615) Pulse Rate:  [71] 71 (09/06 0615) Resp:  [18] 18 (09/06 0615) BP: (165)/(94) 165/94 (09/06 0615) SpO2:  [95 %] 95 % (09/06 0615) Weight:  [82.1 kg-86 kg] 82.1 kg (09/06 0615)  Labs:   Estimated body mass index is 31.07 kg/m as calculated from the following:   Height as of this encounter: 5\' 4"  (1.626 m).   Weight as of this encounter: 82.1 kg.   Imaging Review Plain radiographs demonstrate moderate degenerative joint disease of the right knee(s). The overall alignment ismild varus. The bone quality appears to be adequate for age and reported activity level.      Assessment/Plan:  End stage arthritis, right knee   The patient history, physical examination, clinical judgment of the provider and imaging studies are consistent with end stage degenerative joint disease of the right knee(s) and total knee arthroplasty is deemed medically necessary. The treatment options including medical management, injection therapy arthroscopy and arthroplasty were discussed at length. The risks and benefits of total knee arthroplasty were presented and reviewed. The risks due to aseptic loosening, infection, stiffness, patella tracking problems, thromboembolic complications and other imponderables were discussed. The patient acknowledged the explanation, agreed to proceed with the plan and consent was signed. Patient is being admitted for inpatient treatment for surgery, pain control, PT, OT, prophylactic antibiotics, VTE prophylaxis, progressive ambulation and ADL's and discharge planning. The patient is planning to be discharged home with home health services     Patient's anticipated LOS is less than 2 midnights, meeting these requirements: - Younger than 62 - Lives within 1 hour of care - Has a  competent adult at home to recover with post-op recover - NO history of  - Chronic pain requiring opiods  - Diabetes  - Coronary Artery Disease  - Heart failure  - Heart attack  - Stroke  - DVT/VTE  - Cardiac arrhythmia  - Respiratory Failure/COPD  - Renal failure  - Anemia  - Advanced Liver disease

## 2023-08-21 NOTE — Discharge Instructions (Signed)

## 2023-08-21 NOTE — Op Note (Addendum)
DATE OF SURGERY:  08/21/2023  TIME: 9:04 AM  PATIENT NAME:  Sandra Lozano    AGE: 62 y.o.    PRE-OPERATIVE DIAGNOSIS:  Osteoarthritis Right Knee  POST-OPERATIVE DIAGNOSIS:  Osteoarthritis Right Knee  PROCEDURE:  Procedure(s): RIGHT TOTAL KNEE ARTHROPLASTY  SURGEON: Aldean Baker  ASSISTANT: Joey Dixon  OPERATIVE IMPLANTS: [employer] Persona  Femur size 7, Tibia size D  2- Peg fixed bearing, Patella size 35 3-peg oval button, with a 12 mm polyethylene insert medial congruent.    @ENCIMAGES @  Implant Name Type Inv. Item Serial No. Manufacturer Lot No. LRB No. Used Action  COMP FEM PS KNEE NRW 7 RT - [id number] Joint COMP FEM PS KNEE NRW 7 RT  [employer](ORTH,TRAU,BIO,SG) [id number] Right 1 Implanted  COMP PATELLA 3 PEG 35 - [id number] Joint COMP PATELLA 3 PEG 35  [employer](ORTH,TRAU,BIO,SG) [id number] Right 1 Implanted  INSERT TIB ASF VIV SZ 6-7 12H - [id number] Insert INSERT TIB ASF VIV SZ 6-7 12H  [employer](ORTH,TRAU,BIO,SG) [id number] Right 1 Implanted  BSPLAT TIB D 2 PG STRL KN RT - [id number] Joint BSPLAT TIB D 2 PG STRL KN RT  [employer](ORTH,TRAU,BIO,SG) [id number] Right 1 Implanted      PREOPERATIVE INDICATIONS:   Sandra Lozano is a 62 y.o. year old female with end stage degenerative arthritis of the knee who failed conservative treatment and elected for Total Knee Arthroplasty.   The risks, benefits, and alternatives were discussed at length including but not limited to the risks of infection, bleeding, nerve injury, stiffness, blood clots, the need for revision surgery, cardiopulmonary complications, among others, and they were willing to proceed.  OPERATIVE DESCRIPTION:  The patient was brought to the operative room and placed in a supine position.  Anesthesia was administered.  IV antibiotics were given.  IV TXA was initiated.  The lower extremity was prepped and draped in the usual sterile fashion.  Sandra Lozano was used to cover all exposed skin. Time out was performed.     Anterior quadriceps tendon splitting approach was performed.  The patella was everted and osteophytes were removed.  The anterior horn of the medial and lateral meniscus was removed.   The distal femur was opened with the drill and the intramedullary distal femoral cutting jig was utilized, set at 5 degrees valgus resecting 9 mm off the distal femur.  Care was taken to protect the collateral ligaments.  Then the extramedullary tibial cutting jig was utilized set for 3 degree posterior slope.  Care was taken during the cut to protect the medial and collateral ligaments.  The proximal tibia was removed along with the posterior horns of the menisci.  The PCL was sacrificed.    The extensor gap was measured and was approximately 12 mm.    The distal femoral sizing jig was applied, taking care to avoid notching.  Then the 4-in-1 cutting jig was applied and the anterior and posterior femur was cut, along with the chamfer cuts.  All posterior osteophytes were removed.  The flexion gap was then measured and was symmetric with the extension gap.  The distal femoral preparation using the appropriate jig to prepare the box.  The patella was then measured, and cut with the saw.    The proximal tibia sized and prepared accordingly with the reamer and the punch, and then all components were trialed with the poly insert.  The knee was found to have stable balance and full motion.  The knee was irrigated with normal saline, topical 2 g  TXA was used to soak the wound.  The above named components were then press fit into place.  The final polyethylene component was placed.  The knee was then taken through a range of motion and the patella tracked well and the knee irrigated copiously and the parapatellar and subcutaneous tissue closed with vicryl, and skin closed with staples..  A sterile dressing was applied and patient  was taken to the PACU in stable  condition.  There were no complications.  Total  tourniquet time was 26 minutes.

## 2023-08-21 NOTE — Evaluation (Signed)
Physical Therapy Evaluation Patient Details Name: SUANY RETTER MRN: [id number] DOB: January 21, 1961 Today's Date: 08/21/2023  History of Present Illness  Pt is a 62 y/o female admitted 9/6 for elective R TKA.  PMHx:   HTN, Lumbar fusion  Clinical Impression  Pt admitted with/for elective R TKA.  Pt at min to CGA for basic mobility and gait with the RW.  Pt currently limited functionally due to the problems listed below.  (see problems list.)  Pt will benefit from PT to maximize function and safety to be able to get home safely with available assist.         If plan is discharge home, recommend the following: A little help with walking and/or transfers;A little help with bathing/dressing/bathroom;Assist for transportation   Can travel by private vehicle        Equipment Recommendations  (3 in 1 or riser with handles)  Recommendations for Other Services       Functional Status Assessment Patient has had a recent decline in their functional status and demonstrates the ability to make significant improvements in function in a reasonable and predictable amount of time.     Precautions / Restrictions Precautions Precautions: Knee Precaution Booklet Issued: No Restrictions Weight Bearing Restrictions: Yes RLE Weight Bearing: Weight bearing as tolerated      Mobility  Bed Mobility Overal bed mobility: Needs Assistance Bed Mobility: Supine to Sit, Sit to Supine     Supine to sit: Min assist Sit to supine: Contact guard assist        Transfers Overall transfer level: Needs assistance   Transfers: Sit to/from Stand Sit to Stand: Min assist, Contact guard assist (improvement each trial)           General transfer comment: cues for hand placement/R leg placement/safety, mild boost, progressively less boost to no assist    Ambulation/Gait   Gait Distance (Feet): 80 Feet Assistive device: Rolling walker (2 wheels) Gait Pattern/deviations: Step-to pattern   Gait velocity  interpretation: <1.8 ft/sec, indicate of risk for recurrent falls   General Gait Details: steady step-to pattern with no R knee buckle, but painful at 6/10.  Stairs            Wheelchair Mobility     Tilt Bed    Modified Rankin (Stroke Patients Only)       Balance Overall balance assessment: No apparent balance deficits (not formally assessed)                                           Pertinent Vitals/Pain Pain Assessment Pain Assessment: 0-10 Pain Score: 6  Pain Descriptors / Indicators: Aching, Grimacing, Guarding, Sore Pain Intervention(s): Monitored during session    Home Living Family/patient expects to be discharged to:: Private residence Living Arrangements: Spouse/significant other;Other relatives;Children Available Help at Discharge: Family;Available 24 hours/day Type of Home: Apartment Home Access: Stairs to enter Entrance Stairs-Rails: Right;Left Entrance Stairs-Number of Steps: 14   Home Layout: One level Home Equipment: Agricultural consultant (2 wheels) (riser)      Prior Function Prior Level of Function : Independent/Modified Independent             Mobility Comments: Independent with mobility ADLs Comments: Mod I with ADL;s     Extremity/Trunk Assessment   Upper Extremity Assessment Upper Extremity Assessment: Overall WFL for tasks assessed    Lower Extremity Assessment Lower Extremity Assessment:  Overall WFL for tasks assessed;RLE deficits/detail RLE Deficits / Details: AAROM to 80 deg RLE Coordination: decreased fine motor    Cervical / Trunk Assessment Cervical / Trunk Assessment: Normal  Communication   Communication Communication: No apparent difficulties  Cognition Arousal: Alert Behavior During Therapy: WFL for tasks assessed/performed Overall Cognitive Status: Within Functional Limits for tasks assessed                                          General Comments      Exercises Total  Joint Exercises Ankle Circles/Pumps: AROM, Other reps (comment) (40 reos) Quad Sets: AROM, Both, 10 reps, Supine Heel Slides: AAROM, Right, 10 reps   Assessment/Plan    PT Assessment Patient needs continued PT services  PT Problem List Decreased strength;Decreased activity tolerance;Decreased mobility;Decreased knowledge of use of DME;Decreased knowledge of precautions;Pain       PT Treatment Interventions DME instruction;Gait training;Stair training;Functional mobility training;Therapeutic activities;Patient/family education    PT Goals (Current goals can be found in the Care Plan section)  Acute Rehab PT Goals Patient Stated Goal: Independent and back to work PT Goal Formulation: With patient Time For Goal Achievement: 08/28/23 Potential to Achieve Goals: Good    Frequency 7X/week     Co-evaluation               AM-PAC PT "6 Clicks" Mobility  Outcome Measure Help needed turning from your back to your side while in a flat bed without using bedrails?: A Little Help needed moving from lying on your back to sitting on the side of a flat bed without using bedrails?: A Little Help needed moving to and from a bed to a chair (including a wheelchair)?: A Little Help needed standing up from a chair using your arms (e.g., wheelchair or bedside chair)?: A Little Help needed to walk in hospital room?: A Little Help needed climbing 3-5 steps with a railing? : A Little 6 Click Score: 18    End of Session   Activity Tolerance: Patient tolerated treatment well;Patient limited by pain Patient left: in bed;with call bell/phone within reach Nurse Communication: Mobility status PT Visit Diagnosis: Other abnormalities of gait and mobility (R26.89);Pain Pain - Right/Left: Right Pain - part of body: Knee    Time: 8295-6213 PT Time Calculation (min) (ACUTE ONLY): 36 min   Charges:   PT Evaluation $PT Eval Low Complexity: 1 Low PT Treatments $Gait Training: 8-22 mins PT General  Charges $$ ACUTE PT VISIT: 1 Visit         08/21/2023  Jacinto Halim., PT Acute Rehabilitation Services [phone]  (office)  Eliseo Gum Daryel  08/21/2023, 4:47 PM

## 2023-08-21 NOTE — Anesthesia Procedure Notes (Signed)
Anesthesia Regional Block: Adductor canal block   Pre-Anesthetic Checklist: , timeout performed,  Correct Patient, Correct Site, Correct Laterality,  Correct Procedure, Correct Position, site marked,  Risks and benefits discussed,  Surgical consent,  Pre-op evaluation,  At surgeon's request and post-op pain management  Laterality: Right  Prep: chloraprep       Needles:  Injection technique: Single-shot  Needle Type: Echogenic Needle     Needle Length: 10cm  Needle Gauge: 21     Additional Needles:   Narrative:  Start time: 08/21/2023 7:10 AM End time: 08/21/2023 7:13 AM Injection made incrementally with aspirations every 5 mL.  Performed by: Personally  Anesthesiologist: Beryle Lathe, MD  Additional Notes: No pain on injection. No increased resistance to injection. Injection made in 5cc increments. Good needle visualization. Patient tolerated the procedure well.

## 2023-08-21 NOTE — Plan of Care (Signed)
Patient alert/oriented X4 on admission. Patient VSS and complaint with medication administration. Skin assessed with Freida Busman, RN, no skin issues noted. Patient has a brace on R knee following R knee arthroplasty. Patient administered dilaudid as needed for pain and complained of urinary retention. MD notified to bladder scan patient. Will continue to monitor.   Problem: Education: Goal: Ability to describe self-care measures that may prevent or decrease complications (Diabetes Survival Skills Education) will improve Outcome: Progressing   Problem: Education: Goal: Individualized Educational Video(s) Outcome: Progressing   Problem: Coping: Goal: Ability to adjust to condition or change in health will improve Outcome: Progressing   Problem: Fluid Volume: Goal: Ability to maintain a balanced intake and output will improve Outcome: Progressing   Problem: Health Behavior/Discharge Planning: Goal: Ability to identify and utilize available resources and services will improve Outcome: Progressing   Problem: Health Behavior/Discharge Planning: Goal: Ability to manage health-related needs will improve Outcome: Progressing   Problem: Metabolic: Goal: Ability to maintain appropriate glucose levels will improve Outcome: Progressing   Problem: Nutritional: Goal: Maintenance of adequate nutrition will improve Outcome: Progressing   Problem: Nutritional: Goal: Progress toward achieving an optimal weight will improve Outcome: Progressing   Problem: Skin Integrity: Goal: Risk for impaired skin integrity will decrease Outcome: Progressing   Problem: Tissue Perfusion: Goal: Adequacy of tissue perfusion will improve Outcome: Progressing   Problem: Education: Goal: Knowledge of the prescribed therapeutic regimen will improve Outcome: Progressing   Problem: Education: Goal: Individualized Educational Video(s) Outcome: Progressing   Problem: Activity: Goal: Ability to avoid complications  of mobility impairment will improve Outcome: Progressing   Problem: Activity: Goal: Range of joint motion will improve Outcome: Progressing   Problem: Clinical Measurements: Goal: Postoperative complications will be avoided or minimized Outcome: Progressing   Problem: Pain Management: Goal: Pain level will decrease with appropriate interventions Outcome: Progressing   Problem: Skin Integrity: Goal: Will show signs of wound healing Outcome: Progressing

## 2023-08-22 DIAGNOSIS — Z87891 Personal history of nicotine dependence: Secondary | ICD-10-CM | POA: Diagnosis not present

## 2023-08-22 DIAGNOSIS — I10 Essential (primary) hypertension: Secondary | ICD-10-CM | POA: Diagnosis not present

## 2023-08-22 DIAGNOSIS — J449 Chronic obstructive pulmonary disease, unspecified: Secondary | ICD-10-CM | POA: Diagnosis not present

## 2023-08-22 DIAGNOSIS — M1711 Unilateral primary osteoarthritis, right knee: Secondary | ICD-10-CM | POA: Diagnosis not present

## 2023-08-22 MED ORDER — OXYCODONE-ACETAMINOPHEN 5-325 MG PO TABS: 1.0000 | ORAL_TABLET | ORAL | 0 refills | Status: DC | PRN

## 2023-08-22 NOTE — Discharge Summary (Signed)
Discharge Diagnoses:  Principal Problem:   Total knee replacement status, right Active Problems:   Unilateral primary osteoarthritis, right knee   Surgeries: Procedure(s): RIGHT TOTAL KNEE ARTHROPLASTY on 08/21/2023    Consultants:   Discharged Condition: Improved  Hospital Course: Sandra Lozano is an 62 y.o. female who was admitted 08/21/2023 with a chief complaint of osteoarthritis right knee, with a final diagnosis of Osteoarthritis Right Knee.  Patient was brought to the operating room on 08/21/2023 and underwent Procedure(s): RIGHT TOTAL KNEE ARTHROPLASTY.    Patient was given perioperative antibiotics:  Anti-infectives (From admission, onward)    Start     Dose/Rate Route Frequency Ordered Stop   08/21/23 0600  ceFAZolin (ANCEF) IVPB 2g/100 mL premix        2 g 200 mL/hr over 30 Minutes Intravenous On call to O.R. 08/21/23 0549 08/21/23 0812   08/21/23 0550  ceFAZolin (ANCEF) 2-4 GM/100ML-% IVPB       Note to Pharmacy: Darrick Huntsman: cabinet override      08/21/23 0550 08/21/23 0745     .  Patient was given sequential compression devices, early ambulation, and aspirin for DVT prophylaxis.  Recent vital signs: Patient Vitals for the past 24 hrs:  BP Temp Temp src Pulse Resp SpO2  08/22/23 0751 (!) 135/93 99.2 F (37.3 C) Oral 95 16 97 %  08/22/23 0500 -- 98.4 F (36.9 C) Oral -- -- --  08/22/23 0451 (!) 161/70 -- -- 65 18 97 %  08/21/23 2033 (!) 157/77 98.6 F (37 C) Oral 65 18 94 %  08/21/23 1212 139/80 98 F (36.7 C) Oral 68 16 99 %  08/21/23 1145 (!) 110/96 -- -- 61 13 99 %  08/21/23 1115 (!) 157/70 -- -- 85 (!) 23 96 %  08/21/23 1045 (!) 151/72 -- -- 62 13 100 %  08/21/23 1015 (!) 158/66 -- -- (!) 57 14 100 %  08/21/23 0945 (!) 148/62 97.7 F (36.5 C) -- (!) 48 12 100 %  08/21/23 0940 139/64 -- -- (!) 54 13 100 %  08/21/23 0925 (!) 111/53 -- -- 62 16 100 %  08/21/23 0910 (!) 145/73 97.7 F (36.5 C) -- 81 20 100 %  .  Recent laboratory studies: No  results found.  Discharge Medications:   Allergies as of 08/22/2023   No Known Allergies      Medication List     TAKE these medications    Accu-Chek Guide test strip Generic drug: glucose blood Please use to check blood sugar once per day.   Accu-Chek Softclix Lancets lancets Use to test blood sugar once per day.   acetaminophen 650 MG CR tablet Commonly known as: TYLENOL Take 2,600 mg by mouth daily as needed for pain.   atorvastatin 20 MG tablet Commonly known as: LIPITOR TAKE 1 TABLET BY MOUTH ONCE DAILY IN THE EVENING   Blood Glucose Monitoring Suppl w/Device Kit 1 kit by Does not apply route daily.   Blood Pressure Cuff Misc Check your blood pressure in the morning when you wake up, and at night before you go to bed   CAL-MAG-ZINC-D PO Take 1 tablet by mouth in the morning.   CASTOR OIL EX Apply 1 Application topically daily. Applied to knees   diclofenac Sodium 1 % Gel Commonly known as: VOLTAREN APPLY 4 GRAMS TOPICALLY 4 (FOUR) TIMES DAILY. Strength: 1 %   FISH OIL PO Take 1 capsule by mouth in the morning.   gabapentin 600 MG tablet Commonly  known as: NEURONTIN Take 1.5 tablets (900 mg total) by mouth at bedtime. What changed: how much to take   lisinopril-hydrochlorothiazide 20-12.5 MG tablet Commonly known as: ZESTORETIC Take 2 tablets by mouth daily. What changed: when to take this   meloxicam 15 MG tablet Commonly known as: MOBIC Take 1 tablet (15 mg total) by mouth daily.   metFORMIN 500 MG tablet Commonly known as: GLUCOPHAGE Take 1 tablet (500 mg total) by mouth daily with breakfast. What changed: when to take this   oxyCODONE-acetaminophen 5-325 MG tablet Commonly known as: PERCOCET/ROXICET Take 1 tablet by mouth every 4 (four) hours as needed.   Restasis 0.05 % ophthalmic emulsion Generic drug: cycloSPORINE Place 1 drop into both eyes 2 (two) times daily.   Semaglutide (1 MG/DOSE) 4 MG/3ML Sopn Inject 1 mg into the skin once a  week. What changed: when to take this   triamcinolone ointment 0.5 % Commonly known as: KENALOG Apply 1 Application topically 2 (two) times daily. What changed: when to take this   TURMERIC PO Take 1 capsule by mouth in the morning.        Diagnostic Studies: XR C-ARM NO REPORT  Result Date: 08/04/2023 Please see Notes tab for imaging impression.  Epidural Steroid injection  Result Date: 08/03/2023 Tyrell Antonio, MD     08/15/2023 11:24 AM Lumbosacral Transforaminal Epidural Steroid Injection - Sub-Pedicular Approach with Fluoroscopic Guidance Patient: Sandra Lozano     Date of Birth: 1961/03/14 MRN: [id number] PCP: Lockie Mola, MD     Visit Date: 08/03/2023  Universal Protocol:   Date/Time: 08/03/2023 Consent Given By: the patient Position: PRONE Additional Comments: Vital signs were monitored before and after the procedure. Patient was prepped and draped in the usual sterile fashion. The correct patient, procedure, and site was verified. Injection Procedure Details: Procedure diagnoses: Lumbar radiculopathy [M54.16]  Meds Administered: Meds ordered this encounter Medications  methylPREDNISolone acetate (DEPO-MEDROL) injection 80 mg Laterality: Right Location/Site: L4 Needle:5.0 in., 22 ga.  Short bevel or Quincke spinal needle Needle Placement: Transforaminal Findings:   -Comments: Excellent flow of contrast along the nerve, nerve root and into the epidural space. Procedure Details: After squaring off the end-plates to get a true AP view, the C-arm was positioned so that an oblique view of the foramen as noted above was visualized. The target area is just inferior to the "nose of the scotty dog" or sub pedicular. The soft tissues overlying this structure were infiltrated with 2-3 ml. of 1% Lidocaine without Epinephrine. The spinal needle was inserted toward the target using a "trajectory" view along the fluoroscope beam.  Under AP and lateral visualization, the needle was advanced so it did  not puncture dura and was located close the 6 O'Clock position of the pedical in AP tracterory. Biplanar projections were used to confirm position. Aspiration was confirmed to be negative for CSF and/or blood. A 1-2 ml. volume of Isovue-250 was injected and flow of contrast was noted at each level. Radiographs were obtained for documentation purposes. After attaining the desired flow of contrast documented above, a 0.5 to 1.0 ml test dose of 0.25% Marcaine was injected into each respective transforaminal space.  The patient was observed for 90 seconds post injection.  After no sensory deficits were reported, and normal lower extremity motor function was noted,   the above injectate was administered so that equal amounts of the injectate were placed at each foramen (level) into the transforaminal epidural space. Additional Comments: No complications occurred Dressing: 2  x 2 sterile gauze and Band-Aid  Post-procedure details: Patient was observed during the procedure. Post-procedure instructions were reviewed. Patient left the clinic in stable condition.    Patient benefited maximally from their hospital stay and there were no complications.     Disposition: Discharge disposition: 01-Home or Self Care      Discharge Instructions     Call MD / Call 911   Complete by: As directed    If you experience chest pain or shortness of breath, CALL 911 and be transported to the hospital emergency room.  If you develope a fever above 101 F, pus (white drainage) or increased drainage or redness at the wound, or calf pain, call your surgeon's office.   Constipation Prevention   Complete by: As directed    Drink plenty of fluids.  Prune juice may be helpful.  You may use a stool softener, such as Colace (over the counter) 100 mg twice a day.  Use MiraLax (over the counter) for constipation as needed.   Diet - low sodium heart healthy   Complete by: As directed    Increase activity slowly as tolerated   Complete  by: As directed    Post-operative opioid taper instructions:   Complete by: As directed    POST-OPERATIVE OPIOID TAPER INSTRUCTIONS: It is important to wean off of your opioid medication as soon as possible. If you do not need pain medication after your surgery it is ok to stop day one. Opioids include: Codeine, Hydrocodone(Norco, Vicodin), Oxycodone(Percocet, oxycontin) and hydromorphone amongst others.  Long term and even short term use of opiods can cause: Increased pain response Dependence Constipation Depression Respiratory depression And more.  Withdrawal symptoms can include Flu like symptoms Nausea, vomiting And more Techniques to manage these symptoms Hydrate well Eat regular healthy meals Stay active Use relaxation techniques(deep breathing, meditating, yoga) Do Not substitute Alcohol to help with tapering If you have been on opioids for less than two weeks and do not have pain than it is ok to stop all together.  Plan to wean off of opioids This plan should start within one week post op of your joint replacement. Maintain the same interval or time between taking each dose and first decrease the dose.  Cut the total daily intake of opioids by one tablet each day Next start to increase the time between doses. The last dose that should be eliminated is the evening dose.          Follow-up Information     Nadara Mustard, MD Follow up in 1 week(s).   Specialty: Orthopedic Surgery Contact information: [address] [place] [address] [phone]                  Signed: Nadara Mustard 08/22/2023, 9:02 AM

## 2023-08-22 NOTE — TOC Transition Note (Signed)
Transition of Care [facility]) - CM/SW Discharge Note   Patient Details  Name: Sandra Lozano MRN: [id number] Date of Birth: February 01, 1961  [facility] CM/SW Contact:  Lawerance Sabal, RN Phone Number: 08/22/2023, 9:24 AM   Clinical Narrative:     Sherron Monday w patient over the phone. She states she would need RW and BSC, this has been ordered to be delivered to her room before DC today.  Explained limitations with Medicaid for [facility] services, and she was agreeable to have OP PT referral made to [facility].  Referral [id number] completed.   Final next level of care: Home/Self Care Barriers to Discharge: No Barriers Identified   Patient Goals and CMS Choice CMS Medicare.gov Compare Post Acute Care list provided to:: Patient Choice offered to / list presented to : Patient  Discharge Placement                         Discharge Plan and Services Additional resources added to the After Visit Summary for                  DME Arranged: Bedside commode, Walker rolling DME Agency: [employer] Date DME Agency Contacted: 08/22/23 Time DME Agency Contacted: [phone] Representative spoke with at DME Agency: Vaughan Basta            Social Determinants of Health (SDOH) Interventions SDOH Screenings   Food Insecurity: No Food Insecurity (08/21/2023)  Recent Concern: Food Insecurity - Food Insecurity Present (07/09/2023)  Housing: Low Risk  (08/21/2023)  Transportation Needs: No Transportation Needs (08/21/2023)  Utilities: Not At Risk (08/21/2023)  Alcohol Screen: Low Risk  (07/09/2023)  Depression (PHQ2-9): Low Risk  (07/09/2023)  Financial Resource Strain: Low Risk  (07/09/2023)  Physical Activity: Sufficiently Active (07/09/2023)  Social Connections: Moderately Isolated (07/09/2023)  Stress: No Stress Concern Present (07/09/2023)  Tobacco Use: Medium Risk (08/21/2023)     Readmission Risk Interventions     No data to display

## 2023-08-22 NOTE — Progress Notes (Signed)
Patient ID: Sandra Lozano, female   DOB: 09-Apr-1961, 63 y.o.   MRN: [id number] Plan for discharge to home today.  Patient will need DME set up by the social worker and will need home health physical therapy.  Plan for therapy prior to discharge today.

## 2023-08-22 NOTE — Plan of Care (Signed)
Patient alert/oriented X4. Patient compliant with medication administration and AVS discharge instructions explained in detail. Personal belongings packed up at bedside and PIV removed prior to discharge. VSS, no complaints at this time.   Problem: Education: Goal: Ability to describe self-care measures that may prevent or decrease complications (Diabetes Survival Skills Education) will improve 08/22/2023 0938 by Rodman Pickle, RN Outcome: Adequate for Discharge 08/22/2023 [phone] by Rodman Pickle, RN Outcome: Adequate for Discharge   Problem: Education: Goal: Individualized Educational Video(s) 08/22/2023 0938 by Rodman Pickle, RN Outcome: Adequate for Discharge 08/22/2023 [phone] by Rodman Pickle, RN Outcome: Adequate for Discharge   Problem: Coping: Goal: Ability to adjust to condition or change in health will improve 08/22/2023 0938 by Rodman Pickle, RN Outcome: Adequate for Discharge 08/22/2023 0938 by Rodman Pickle, RN Outcome: Adequate for Discharge   Problem: Fluid Volume: Goal: Ability to maintain a balanced intake and output will improve 08/22/2023 0938 by Rodman Pickle, RN Outcome: Adequate for Discharge 08/22/2023 0938 by Rodman Pickle, RN Outcome: Adequate for Discharge   Problem: Health Behavior/Discharge Planning: Goal: Ability to identify and utilize available resources and services will improve 08/22/2023 0938 by Rodman Pickle, RN Outcome: Adequate for Discharge 08/22/2023 0938 by Rodman Pickle, RN Outcome: Adequate for Discharge   Problem: Health Behavior/Discharge Planning: Goal: Ability to manage health-related needs will improve 08/22/2023 0938 by Rodman Pickle, RN Outcome: Adequate for Discharge 08/22/2023 [phone] by Rodman Pickle, RN Outcome: Adequate for Discharge   Problem: Metabolic: Goal: Ability to maintain appropriate glucose levels will improve 08/22/2023 0938 by Rodman Pickle, RN Outcome: Adequate for Discharge 08/22/2023 0938 by Rodman Pickle, RN Outcome:  Adequate for Discharge   Problem: Nutritional: Goal: Maintenance of adequate nutrition will improve 08/22/2023 0938 by Rodman Pickle, RN Outcome: Adequate for Discharge 08/22/2023 [phone] by Rodman Pickle, RN Outcome: Adequate for Discharge   Problem: Nutritional: Goal: Progress toward achieving an optimal weight will improve 08/22/2023 0938 by Rodman Pickle, RN Outcome: Adequate for Discharge 08/22/2023 [phone] by Rodman Pickle, RN Outcome: Adequate for Discharge   Problem: Skin Integrity: Goal: Risk for impaired skin integrity will decrease 08/22/2023 0938 by Rodman Pickle, RN Outcome: Adequate for Discharge 08/22/2023 [phone] by Rodman Pickle, RN Outcome: Adequate for Discharge   Problem: Tissue Perfusion: Goal: Adequacy of tissue perfusion will improve 08/22/2023 0938 by Rodman Pickle, RN Outcome: Adequate for Discharge 08/22/2023 0938 by Rodman Pickle, RN Outcome: Adequate for Discharge   Problem: Education: Goal: Knowledge of the prescribed therapeutic regimen will improve 08/22/2023 0938 by Rodman Pickle, RN Outcome: Adequate for Discharge 08/22/2023 0938 by Rodman Pickle, RN Outcome: Adequate for Discharge   Problem: Education: Goal: Individualized Educational Video(s) 08/22/2023 0938 by Rodman Pickle, RN Outcome: Adequate for Discharge 08/22/2023 0938 by Rodman Pickle, RN Outcome: Adequate for Discharge   Problem: Activity: Goal: Ability to avoid complications of mobility impairment will improve 08/22/2023 0938 by Rodman Pickle, RN Outcome: Adequate for Discharge 08/22/2023 [phone] by Rodman Pickle, RN Outcome: Adequate for Discharge   Problem: Activity: Goal: Range of joint motion will improve 08/22/2023 0938 by Rodman Pickle, RN Outcome: Adequate for Discharge 08/22/2023 [phone] by Rodman Pickle, RN Outcome: Adequate for Discharge   Problem: Clinical Measurements: Goal: Postoperative complications will be avoided or minimized 08/22/2023 0938 by Rodman Pickle, RN Outcome:  Adequate for Discharge 08/22/2023 0938 by Rodman Pickle, RN Outcome: Adequate for Discharge   Problem: Pain Management: Goal: Pain level will decrease with appropriate interventions 08/22/2023 0938 by Rodman Pickle, RN Outcome: Adequate for Discharge 08/22/2023 0938 by Rodman Pickle, RN Outcome:  Adequate for Discharge   Problem: Skin Integrity: Goal: Will show signs of wound healing 08/22/2023 0938 by Rodman Pickle, RN Outcome: Adequate for Discharge 08/22/2023 [phone] by Rodman Pickle, RN Outcome: Adequate for Discharge

## 2023-08-23 ENCOUNTER — Encounter (HOSPITAL_COMMUNITY): Payer: Self-pay | Admitting: Orthopedic Surgery

## 2023-08-24 ENCOUNTER — Ambulatory Visit: Payer: Medicaid Other | Admitting: Podiatry

## 2023-08-26 NOTE — H&P (Signed)
  History and physical was completed on 08/21/2023 at 6:43 AM.

## 2023-08-28 ENCOUNTER — Ambulatory Visit: Payer: Medicaid Other | Attending: Orthopedic Surgery

## 2023-08-28 ENCOUNTER — Other Ambulatory Visit: Payer: Self-pay

## 2023-08-28 DIAGNOSIS — R262 Difficulty in walking, not elsewhere classified: Secondary | ICD-10-CM | POA: Diagnosis not present

## 2023-08-28 DIAGNOSIS — M6281 Muscle weakness (generalized): Secondary | ICD-10-CM | POA: Insufficient documentation

## 2023-08-28 DIAGNOSIS — G8929 Other chronic pain: Secondary | ICD-10-CM | POA: Insufficient documentation

## 2023-08-28 DIAGNOSIS — M25561 Pain in right knee: Secondary | ICD-10-CM | POA: Insufficient documentation

## 2023-08-28 NOTE — Therapy (Signed)
OUTPATIENT PHYSICAL THERAPY LOWER EXTREMITY EVALUATION   Patient Name: Sandra Lozano MRN: [id number] DOB:Nov 02, 1961, 62 y.o., female Today's Date: 08/28/2023  END OF SESSION:  PT End of Session - 08/28/23 0725     Visit Number 1    Number of Visits 17    Date for PT Re-Evaluation 10/30/23    Authorization Type [place] MEDICAID [facility]    PT Start Time 0717    PT Stop Time 0800    PT Time Calculation (min) 43 min    Activity Tolerance Patient tolerated treatment well;Patient limited by pain    Behavior During Therapy [facility] for tasks assessed/performed             Past Medical History:  Diagnosis Date   Arthritis    right knee, lower back   Chest wall pain 04/20/2021   Dyspnea    very rare -tx with albuterol neb sol if needed   Hyperlipidemia    Hypertension    Intertrigo 08/06/2022   Pre-diabetes    per patient - does not check blood sugar   Right hip pain 05/05/2018   Rupture of anterior cruciate ligament of right knee 02/15/2018   Sprain of medial collateral ligament of right knee 02/15/2018   SVD (spontaneous vaginal delivery)    x 5 - only 2 living, 1 stillborn and 1 premature at 7 months demise,1 child deceased   Wears dentures    upper and lower   Past Surgical History:  Procedure Laterality Date   FOOT ARTHRODESIS Left 11/02/2020   Procedure: LEFT MIDFOOT FUSION BASE 1ST AND 2ND METATARSAL;  Surgeon: Nadara Mustard, MD;  Location: [facility] OR;  Service: Orthopedics;  Laterality: Left;   LUMBAR FUSION  05/20/2019   GILL PROCEDURE, LEFT TRANSFORAMINAL LUMBAR INTERBODY FUSION, PEDICLE INSTRUMENTATION, BILATERAL FUSION (N/A   TOTAL KNEE ARTHROPLASTY Right 08/21/2023   Procedure: RIGHT TOTAL KNEE ARTHROPLASTY;  Surgeon: Nadara Mustard, MD;  Location: [facility] OR;  Service: Orthopedics;  Laterality: Right;   TUBAL LIGATION     Patient Active Problem List   Diagnosis Date Noted   Unilateral primary osteoarthritis, right knee 08/21/2023   Total knee replacement status, right  08/21/2023   Hidradenitis suppurativa of right axilla 06/03/2023   Vaginal irritation 02/21/2023   Skin lesion of back 02/21/2023   Tinea 12/18/2022   Left shoulder pain 08/25/2022   DJD (degenerative joint disease), multiple sites 08/06/2022   COPD, mild (HCC) 02/11/2022   Carpal tunnel syndrome, bilateral 12/21/2021   Arthritis of finger of left hand 11/26/2021   Traumatic arthritis of left foot    Lichen simplex chronicus 06/04/2020   Arthritis of midfoot 03/14/2020   History of lumbar fusion 03/14/2020   Well controlled diabetes mellitus (HCC) 11/07/2019   Nummular eczema 07/06/2019   Lumbar stenosis 05/20/2019   Acute left-sided low back pain without sciatica 07/18/2018   Osteoarthritis of right hip 06/03/2018   Chronic right shoulder pain 05/05/2018   Essential hypertension 02/15/2018    PCP: Lockie Mola, MD   REFERRING PROVIDER: Nadara Mustard, MD   REFERRING DIAG:  R53.1 (ICD-10-CM) - Weakness  Z96.659 (ICD-10-CM) - S/P knee replacement    THERAPY DIAG:  Chronic pain of right knee  Muscle weakness (generalized)  Difficulty in walking, not elsewhere classified  Rationale for Evaluation and Treatment: Rehabilitation  ONSET DATE: 08/21/23 surgery  SUBJECTIVE:   SUBJECTIVE STATEMENT: Pt reports has been doing pretty good since the R knee surgery. She states she has not been provided a  HEP.  PERTINENT HISTORY: HTN  PAIN:  Are you having pain? Yes: NPRS scale: 6/10; pain range past week: 3-7/10 Pain location: R knee Pain description: ache, discomfort Aggravating factors: Moving, walking Relieving factors: Ice man, tylenol  PRECAUTIONS: Knee  RED FLAGS: None   WEIGHT BEARING RESTRICTIONS: No WBAT  FALLS:  Has patient fallen in last 6 months? No  LIVING ENVIRONMENT: Lives with: lives with their family Lives in: House/apartment Stairs: Yes: External: 14 steps; can reach both   OCCUPATION: Not working  PLOF: Independent with household  mobility with device and Independent with community mobility with device  PATIENT GOALS: Good use of my R knee  NEXT MD VISIT: 09/08/23  OBJECTIVE:   PATIENT SURVEYS:  FOTO: Perceived function   39%, predicted   51%    COGNITION: Overall cognitive status: Within functional limits for tasks assessed     SENSATION: WFL  EDEMA:  Sweeling of the r knee  MUSCLE LENGTH: Hamstrings: Right NT deg; Left NT deg Thomas test: Right NT deg; Left NT deg  POSTURE: No Significant postural limitations  PALPATION: TTP of the peri-R knee and thigh  LOWER EXTREMITY ROM:  Active assisted ROM Right eval Left eval  Hip flexion    Hip extension    Hip abduction    Hip adduction    Hip internal rotation    Hip external rotation    Knee flexion 80   Knee extension 10 lacking   Ankle dorsiflexion    Ankle plantarflexion    Ankle inversion    Ankle eversion     (Blank rows = not tested)  LOWER EXTREMITY MMT:  MMT Right eval Left eval  Hip flexion 2   Hip extension    Hip abduction 2   Hip adduction    Hip internal rotation 2   Hip external rotation 2   Knee flexion 2   Knee extension 2   Ankle dorsiflexion    Ankle plantarflexion    Ankle inversion    Ankle eversion     (Blank rows = not tested)  LOWER EXTREMITY SPECIAL TESTS:  NT  FUNCTIONAL TESTS:  Complete and 5xSTS when pt is no longer needing an assist device with walking  GAIT: Distance walked: 141ft Assistive device utilized: Walker - 2 wheeled Level of assistance: Complete Independence Comments: Step to pattern c L LE   TODAY'S TREATMENT:    [facility] Adult PT Treatment:                                                DATE: 08/28/23 Therapeutic Exercise: Developed, instructed in, and pt completed therex as noted in HEP Self Care: Proper technique to asc/dsc steps  PATIENT EDUCATION:   Education details: Eval findings, POC, HEP, self care  Person educated: Patient Education method: Explanation, Demonstration, Tactile cues, Verbal cues, and Handouts Education comprehension: verbalized understanding, returned demonstration, verbal cues required, tactile cues required, and needs further education  HOME EXERCISE PROGRAM: Access Code: [email or web address] URL: [email or web address] Date: 08/28/2023 Prepared by: Joellyn Rued  Exercises - Supine Quad Set  - 3 x daily - 7 x weekly - 1 sets - 10 reps - 5 hold - Supine Heel Slide with Strap  - 3 x daily - 7 x weekly - 1 sets - 10 reps - 5 hold - Supine Knee Extension Strengthening  - 3 x daily - 7 x weekly - 1 sets - 10 reps - 3 hold - Sit to Stand with Counter Support  - 3 x daily - 7 x weekly - 1 sets - 5 reps  ASSESSMENT:  CLINICAL IMPRESSION: Patient is a 62 y.o. female who was seen today for physical therapy evaluation and treatment for  R53.1 (ICD-10-CM) - Weakness  Z96.659 (ICD-10-CM) - S/P knee replacement  .Pt presents to PT walking c a RW, WBAT R LE, mod Ind. For 1 week s/p R TKR surgery, the pt's demonstrates good AAROM. Strength is limited with pt not being able to complete a SAQ or SLR. A HEP was initiated to address R knee/LE ROM and strength with pt returning demonstration. Pt will benefit from skilled PT 2w8 to address impairments to optimize function with less pain.   OBJECTIVE IMPAIRMENTS: decreased activity tolerance, decreased mobility, difficulty walking, decreased ROM, decreased strength, increased edema, and pain.   ACTIVITY LIMITATIONS: carrying, lifting, bending, sitting, standing, squatting, sleeping, stairs, transfers, bed mobility, bathing, toileting, dressing, self feeding, hygiene/grooming, locomotion level, and caring for others  PARTICIPATION LIMITATIONS: meal prep, cleaning, laundry, driving, shopping, and community activity  PERSONAL FACTORS: Past/current experiences and Time since onset of  injury/illness/exacerbation are also affecting patient's functional outcome.   REHAB POTENTIAL: Excellent  CLINICAL DECISION MAKING: Stable/uncomplicated  EVALUATION COMPLEXITY: Low   GOALS:  SHORT TERM GOALS: Target date: 09/18/23 Pt will be Ind in an initial HEP  Baseline: started Goal status: INITIAL  2.  Increase L knee AROM to 5-100d to improve R knee function Baseline: 10-80d Goal status: INITIAL  3.  Pt will be able to complete a SAQ and SLR independently Baseline: unable Goal status: INITIAL  LONG TERM GOALS: Target date: 10/30/23  Pt will be Ind in a final HEP to maintain achieved LOF Baseline: started Goal status: INITIAL  2.  Increase L knee AROM to 0-115d to for appropriate R knee function Baseline: 10-80d Goal status: INITIAL  3.  Increased R hip and knee strength to 4/5 or greater for appropriate R knee function Baseline:  Goal status: INITIAL  4.  Pt will be able to walk 837ft Indly and asc/dsc 12 steps c a HR and mod Ind for community mobility  Baseline:  Goal status: INITIAL  5.  Improve 5xSTS by MCID of 5" and by MCID of 16ft as indication of improved functional mobility  Baseline: Complete and 5xSTS when pt is no longer needing an assist device with walking Goal status: INITIAL  6.  Pt's FOTO score will improved to the predicted value of 51% as indication of improved function  Baseline: 39% Goal status: INITIAL   PLAN:  PT FREQUENCY: 2x/week  PT DURATION: 8 weeks  PLANNED INTERVENTIONS: Therapeutic exercises, Therapeutic activity, Neuromuscular re-education, Balance training, Gait training, Patient/Family education, Self Care,  Joint mobilization, Stair training, Aquatic Therapy, Dry Needling, Electrical stimulation, Cryotherapy, Moist heat, Taping, Vasopneumatic device, Ultrasound, Ionotophoresis 4mg /ml Dexamethasone, Manual therapy, and Re-evaluation  PLAN FOR NEXT SESSION: Review FOTO; assess response to HEP; progress therex as  indicated; use of modalities, manual therapy; and TPDN as indicated.    Kisa Fujii MS, PT 08/28/23 4:35 PM   [employer] Authorization   Choose one: Rehabilitative  Standardized Assessment or Functional Outcome Tool: See Pain Assessment and Other FOTO  Score or Percent Disability: 39  Body Parts Treated (Select each separately):  Knee. Overall deficits/functional limitations for body part selected: moderate   If treatment provided at initial evaluation, no treatment charged due to lack of authorization.

## 2023-09-03 ENCOUNTER — Ambulatory Visit: Payer: Medicaid Other | Admitting: Physical Therapy

## 2023-09-03 ENCOUNTER — Encounter: Payer: Self-pay | Admitting: Physical Therapy

## 2023-09-03 DIAGNOSIS — M25561 Pain in right knee: Secondary | ICD-10-CM | POA: Diagnosis not present

## 2023-09-03 DIAGNOSIS — G8929 Other chronic pain: Secondary | ICD-10-CM

## 2023-09-03 DIAGNOSIS — M6281 Muscle weakness (generalized): Secondary | ICD-10-CM

## 2023-09-03 DIAGNOSIS — R262 Difficulty in walking, not elsewhere classified: Secondary | ICD-10-CM | POA: Diagnosis not present

## 2023-09-03 NOTE — Therapy (Signed)
OUTPATIENT PHYSICAL THERAPY LOWER EXTREMITY TREATMENT   Patient Name: Sandra Lozano MRN: [id number] DOB:Jan 07, 1961, 62 y.o., female Today's Date: 09/03/2023  END OF SESSION:  PT End of Session - 09/03/23 1130     Visit Number 2    Number of Visits 17    Date for PT Re-Evaluation 10/30/23    Authorization Type [place] MEDICAID [facility]    Authorization Time Period 08/31/23-10/30/23    Authorization - Visit Number 1    Authorization - Number of Visits 12    PT Start Time 1143    PT Stop Time 1231    PT Time Calculation (min) 48 min             Past Medical History:  Diagnosis Date   Arthritis    right knee, lower back   Chest wall pain 04/20/2021   Dyspnea    very rare -tx with albuterol neb sol if needed   Hyperlipidemia    Hypertension    Intertrigo 08/06/2022   Pre-diabetes    per patient - does not check blood sugar   Right hip pain 05/05/2018   Rupture of anterior cruciate ligament of right knee 02/15/2018   Sprain of medial collateral ligament of right knee 02/15/2018   SVD (spontaneous vaginal delivery)    x 5 - only 2 living, 1 stillborn and 1 premature at 7 months demise,1 child deceased   Wears dentures    upper and lower   Past Surgical History:  Procedure Laterality Date   FOOT ARTHRODESIS Left 11/02/2020   Procedure: LEFT MIDFOOT FUSION BASE 1ST AND 2ND METATARSAL;  Surgeon: Nadara Mustard, MD;  Location: [facility] OR;  Service: Orthopedics;  Laterality: Left;   LUMBAR FUSION  05/20/2019   GILL PROCEDURE, LEFT TRANSFORAMINAL LUMBAR INTERBODY FUSION, PEDICLE INSTRUMENTATION, BILATERAL FUSION (N/A   TOTAL KNEE ARTHROPLASTY Right 08/21/2023   Procedure: RIGHT TOTAL KNEE ARTHROPLASTY;  Surgeon: Nadara Mustard, MD;  Location: [facility] OR;  Service: Orthopedics;  Laterality: Right;   TUBAL LIGATION     Patient Active Problem List   Diagnosis Date Noted   Unilateral primary osteoarthritis, right knee 08/21/2023   Total knee replacement status, right 08/21/2023    Hidradenitis suppurativa of right axilla 06/03/2023   Vaginal irritation 02/21/2023   Skin lesion of back 02/21/2023   Tinea 12/18/2022   Left shoulder pain 08/25/2022   DJD (degenerative joint disease), multiple sites 08/06/2022   COPD, mild (HCC) 02/11/2022   Carpal tunnel syndrome, bilateral 12/21/2021   Arthritis of finger of left hand 11/26/2021   Traumatic arthritis of left foot    Lichen simplex chronicus 06/04/2020   Arthritis of midfoot 03/14/2020   History of lumbar fusion 03/14/2020   Well controlled diabetes mellitus (HCC) 11/07/2019   Nummular eczema 07/06/2019   Lumbar stenosis 05/20/2019   Acute left-sided low back pain without sciatica 07/18/2018   Osteoarthritis of right hip 06/03/2018   Chronic right shoulder pain 05/05/2018   Essential hypertension 02/15/2018    PCP: Lockie Mola, MD   REFERRING PROVIDER: Nadara Mustard, MD   REFERRING DIAG:  R53.1 (ICD-10-CM) - Weakness  Z96.659 (ICD-10-CM) - S/P knee replacement    THERAPY DIAG:  Chronic pain of right knee  Muscle weakness (generalized)  Rationale for Evaluation and Treatment: Rehabilitation  ONSET DATE: 08/21/23 surgery  SUBJECTIVE:   SUBJECTIVE STATEMENT: I would be doing good if it wasn't for my ankle pain from arthritis. It is keeping me from progressing with the knee rehab. I  see MD next Thursday. He is going to look at my ankle.    PERTINENT HISTORY: HTN  PAIN:  Are you having pain? Yes: NPRS scale: 6/10; pain range past week: 3-7/10 Pain location: R knee Pain description: ache, discomfort Aggravating factors: Moving, walking Relieving factors: Ice man, tylenol  PRECAUTIONS: Knee  RED FLAGS: None   WEIGHT BEARING RESTRICTIONS: No WBAT  FALLS:  Has patient fallen in last 6 months? No  LIVING ENVIRONMENT: Lives with: lives with their family Lives in: House/apartment Stairs: Yes: External: 14 steps; can reach both   OCCUPATION: Not working  PLOF: Independent with  household mobility with device and Independent with community mobility with device  PATIENT GOALS: Good use of my R knee  NEXT MD VISIT: 09/08/23  OBJECTIVE:   PATIENT SURVEYS:  FOTO: Perceived function   39%, predicted   51%    COGNITION: Overall cognitive status: Within functional limits for tasks assessed     SENSATION: WFL  EDEMA:  Sweeling of the r knee  MUSCLE LENGTH: Hamstrings: Right NT deg; Left NT deg Thomas test: Right NT deg; Left NT deg  POSTURE: No Significant postural limitations  PALPATION: TTP of the peri-R knee and thigh  LOWER EXTREMITY ROM:  Active assisted ROM Right eval Left eval Right   Hip flexion     Hip extension     Hip abduction     Hip adduction     Hip internal rotation     Hip external rotation     Knee flexion 80  98 AA  Knee extension 10 lacking    Ankle dorsiflexion     Ankle plantarflexion     Ankle inversion     Ankle eversion      (Blank rows = not tested)  LOWER EXTREMITY MMT:  MMT Right eval Left eval  Hip flexion 2   Hip extension    Hip abduction 2   Hip adduction    Hip internal rotation 2   Hip external rotation 2   Knee flexion 2   Knee extension 2   Ankle dorsiflexion    Ankle plantarflexion    Ankle inversion    Ankle eversion     (Blank rows = not tested)  LOWER EXTREMITY SPECIAL TESTS:  NT  FUNCTIONAL TESTS:  Complete and 5xSTS when pt is no longer needing an assist device with walking  GAIT: Distance walked: 123ft Assistive device utilized: Environmental consultant - 2 wheeled Level of assistance: Complete Independence Comments: Step to pattern c L LE   TODAY'S TREATMENT:    [facility] Adult PT Treatment:                                                DATE: 09/03/23 Therapeutic Exercise: Nustep L1 UE/LE x 4 min for ROM Rec Bike no resist- partial to full revolutions  x 2 minutes QS 5 sec x 10 into towel  SAQ AA 2-3 sec x 10 Heel slides AAROM STS working on foot placement.   Ice pack right knee x 10  minutes    [facility] Adult PT Treatment:  DATE: 08/28/23 Therapeutic Exercise: Developed, instructed in, and pt completed therex as noted in HEP Self Care: Proper technique to asc/dsc steps                                                                                                                             PATIENT EDUCATION:  Education details: Eval findings, POC, HEP, self care  Person educated: Patient Education method: Explanation, Demonstration, Tactile cues, Verbal cues, and Handouts Education comprehension: verbalized understanding, returned demonstration, verbal cues required, tactile cues required, and needs further education  HOME EXERCISE PROGRAM: Access Code: [email or web address] URL: [email or web address] Date: 08/28/2023 Prepared by: Joellyn Rued  Exercises - Supine Quad Set  - 3 x daily - 7 x weekly - 1 sets - 10 reps - 5 hold - Supine Heel Slide with Strap  - 3 x daily - 7 x weekly - 1 sets - 10 reps - 5 hold - Supine Knee Extension Strengthening  - 3 x daily - 7 x weekly - 1 sets - 10 reps - 3 hold - Sit to Stand with Counter Support  - 3 x daily - 7 x weekly - 1 sets - 5 reps  ASSESSMENT:  CLINICAL IMPRESSION: Pt reports increased ankle pain since surgery with no help after ankle injection. She arrives with RW. Reviewed HEP and she continues to have considerable extension strength deficit and notes buckling in right knee. She did show progress in knee flexion ROM, able to progress to full revolutions on rec bike. She is interested in using the bike in her complex when able. Pt will continue to benefit from skilled PT to address impairments to optimize function with less pain.    EVAL: Patient is a 62 y.o. female who was seen today for physical therapy evaluation and treatment for  R53.1 (ICD-10-CM) - Weakness  Z96.659 (ICD-10-CM) - S/P knee replacement  .Pt presents to PT walking c a RW, WBAT R LE, mod Ind. For 1 week  s/p R TKR surgery, the pt's demonstrates good AAROM. Strength is limited with pt not being able to complete a SAQ or SLR. A HEP was initiated to address R knee/LE ROM and strength with pt returning demonstration. Pt will benefit from skilled PT 2w8 to address impairments to optimize function with less pain.   OBJECTIVE IMPAIRMENTS: decreased activity tolerance, decreased mobility, difficulty walking, decreased ROM, decreased strength, increased edema, and pain.   ACTIVITY LIMITATIONS: carrying, lifting, bending, sitting, standing, squatting, sleeping, stairs, transfers, bed mobility, bathing, toileting, dressing, self feeding, hygiene/grooming, locomotion level, and caring for others  PARTICIPATION LIMITATIONS: meal prep, cleaning, laundry, driving, shopping, and community activity  PERSONAL FACTORS: Past/current experiences and Time since onset of injury/illness/exacerbation are also affecting patient's functional outcome.   REHAB POTENTIAL: Excellent  CLINICAL DECISION MAKING: Stable/uncomplicated  EVALUATION COMPLEXITY: Low   GOALS:  SHORT TERM GOALS: Target date: 09/18/23 Pt will be Ind in an initial HEP  Baseline: started Goal status: INITIAL  2.  Increase L knee AROM to 5-100d to improve R knee function Baseline: 10-80d Goal status: INITIAL  3.  Pt will be able to complete a SAQ and SLR independently Baseline: unable Goal status: INITIAL  LONG TERM GOALS: Target date: 10/30/23  Pt will be Ind in a final HEP to maintain achieved LOF Baseline: started Goal status: INITIAL  2.  Increase L knee AROM to 0-115d to for appropriate R knee function Baseline: 10-80d Goal status: INITIAL  3.  Increased R hip and knee strength to 4/5 or greater for appropriate R knee function Baseline:  Goal status: INITIAL  4.  Pt will be able to walk 856ft Indly and asc/dsc 12 steps c a HR and mod Ind for community mobility  Baseline:  Goal status: INITIAL  5.  Improve 5xSTS by MCID of 5"  and by MCID of 89ft as indication of improved functional mobility  Baseline: Complete and 5xSTS when pt is no longer needing an assist device with walking Goal status: INITIAL  6.  Pt's FOTO score will improved to the predicted value of 51% as indication of improved function  Baseline: 39% Goal status: INITIAL   PLAN:  PT FREQUENCY: 2x/week  PT DURATION: 8 weeks  PLANNED INTERVENTIONS: Therapeutic exercises, Therapeutic activity, Neuromuscular re-education, Balance training, Gait training, Patient/Family education, Self Care, Joint mobilization, Stair training, Aquatic Therapy, Dry Needling, Electrical stimulation, Cryotherapy, Moist heat, Taping, Vasopneumatic device, Ultrasound, Ionotophoresis 4mg /ml Dexamethasone, Manual therapy, and Re-evaluation  PLAN FOR NEXT SESSION: Review FOTO; assess response to HEP; progress therex as indicated; use of modalities, manual therapy; and TPDN as indicated.    Jannette Spanner, PTA 09/03/23 12:30 PM Phone: [phone] Fax: [phone]   [facility] Authorization   Choose one: Rehabilitative  Standardized Assessment or Functional Outcome Tool: See Pain Assessment and Other FOTO  Score or Percent Disability: 39  Body Parts Treated (Select each separately):  Knee. Overall deficits/functional limitations for body part selected: moderate   If treatment provided at initial evaluation, no treatment charged due to lack of authorization.

## 2023-09-07 ENCOUNTER — Ambulatory Visit: Payer: Medicaid Other | Admitting: Physical Therapy

## 2023-09-07 ENCOUNTER — Encounter: Payer: Self-pay | Admitting: Physical Therapy

## 2023-09-07 DIAGNOSIS — G8929 Other chronic pain: Secondary | ICD-10-CM

## 2023-09-07 DIAGNOSIS — M25561 Pain in right knee: Secondary | ICD-10-CM | POA: Diagnosis not present

## 2023-09-07 DIAGNOSIS — M6281 Muscle weakness (generalized): Secondary | ICD-10-CM

## 2023-09-07 DIAGNOSIS — R262 Difficulty in walking, not elsewhere classified: Secondary | ICD-10-CM | POA: Diagnosis not present

## 2023-09-07 NOTE — Therapy (Signed)
OUTPATIENT PHYSICAL THERAPY LOWER EXTREMITY TREATMENT   Patient Name: Sandra Lozano MRN: [id number] DOB:August 22, 1961, 62 y.o., female Today's Date: 09/07/2023  END OF SESSION:  PT End of Session - 09/07/23 0847     Visit Number 3    Number of Visits 17    Date for PT Re-Evaluation 10/30/23    Authorization Type [place] MEDICAID [facility]    Authorization Time Period 08/31/23-10/30/23    Authorization - Visit Number 2    Authorization - Number of Visits 12    PT Start Time 0845    PT Stop Time 0938    PT Time Calculation (min) 53 min             Past Medical History:  Diagnosis Date   Arthritis    right knee, lower back   Chest wall pain 04/20/2021   Dyspnea    very rare -tx with albuterol neb sol if needed   Hyperlipidemia    Hypertension    Intertrigo 08/06/2022   Pre-diabetes    per patient - does not check blood sugar   Right hip pain 05/05/2018   Rupture of anterior cruciate ligament of right knee 02/15/2018   Sprain of medial collateral ligament of right knee 02/15/2018   SVD (spontaneous vaginal delivery)    x 5 - only 2 living, 1 stillborn and 1 premature at 7 months demise,1 child deceased   Wears dentures    upper and lower   Past Surgical History:  Procedure Laterality Date   FOOT ARTHRODESIS Left 11/02/2020   Procedure: LEFT MIDFOOT FUSION BASE 1ST AND 2ND METATARSAL;  Surgeon: Nadara Mustard, MD;  Location: [facility] OR;  Service: Orthopedics;  Laterality: Left;   LUMBAR FUSION  05/20/2019   GILL PROCEDURE, LEFT TRANSFORAMINAL LUMBAR INTERBODY FUSION, PEDICLE INSTRUMENTATION, BILATERAL FUSION (N/A   TOTAL KNEE ARTHROPLASTY Right 08/21/2023   Procedure: RIGHT TOTAL KNEE ARTHROPLASTY;  Surgeon: Nadara Mustard, MD;  Location: [facility] OR;  Service: Orthopedics;  Laterality: Right;   TUBAL LIGATION     Patient Active Problem List   Diagnosis Date Noted   Unilateral primary osteoarthritis, right knee 08/21/2023   Total knee replacement status, right 08/21/2023    Hidradenitis suppurativa of right axilla 06/03/2023   Vaginal irritation 02/21/2023   Skin lesion of back 02/21/2023   Tinea 12/18/2022   Left shoulder pain 08/25/2022   DJD (degenerative joint disease), multiple sites 08/06/2022   COPD, mild (HCC) 02/11/2022   Carpal tunnel syndrome, bilateral 12/21/2021   Arthritis of finger of left hand 11/26/2021   Traumatic arthritis of left foot    Lichen simplex chronicus 06/04/2020   Arthritis of midfoot 03/14/2020   History of lumbar fusion 03/14/2020   Well controlled diabetes mellitus (HCC) 11/07/2019   Nummular eczema 07/06/2019   Lumbar stenosis 05/20/2019   Acute left-sided low back pain without sciatica 07/18/2018   Osteoarthritis of right hip 06/03/2018   Chronic right shoulder pain 05/05/2018   Essential hypertension 02/15/2018    PCP: Lockie Mola, MD   REFERRING PROVIDER: Nadara Mustard, MD   REFERRING DIAG:  R53.1 (ICD-10-CM) - Weakness  Z96.659 (ICD-10-CM) - S/P knee replacement    THERAPY DIAG:  Chronic pain of right knee  Muscle weakness (generalized)  Rationale for Evaluation and Treatment: Rehabilitation  ONSET DATE: 08/21/23 surgery  SUBJECTIVE:   SUBJECTIVE STATEMENT: Ankle pain 8-9/10. Knee pain  4-5/10. See MD tomorrow,     PERTINENT HISTORY: HTN  PAIN:  Are you having pain? Yes:  NPRS scale: 4-5/10 Pain location: R knee Pain description: ache, discomfort Aggravating factors: Moving, walking Relieving factors: Ice man, tylenol  PRECAUTIONS: Knee  RED FLAGS: None   WEIGHT BEARING RESTRICTIONS: No WBAT  FALLS:  Has patient fallen in last 6 months? No  LIVING ENVIRONMENT: Lives with: lives with their family Lives in: House/apartment Stairs: Yes: External: 14 steps; can reach both   OCCUPATION: Not working  PLOF: Independent with household mobility with device and Independent with community mobility with device  PATIENT GOALS: Good use of my R knee  NEXT MD VISIT:  09/08/23  OBJECTIVE:   PATIENT SURVEYS:  FOTO: Perceived function   39%, predicted   51%    COGNITION: Overall cognitive status: Within functional limits for tasks assessed     SENSATION: WFL  EDEMA:  Sweeling of the r knee  MUSCLE LENGTH: Hamstrings: Right NT deg; Left NT deg Thomas test: Right NT deg; Left NT deg  POSTURE: No Significant postural limitations  PALPATION: TTP of the peri-R knee and thigh  LOWER EXTREMITY ROM:  Active assisted ROM Right eval Left eval Right  Right 09/07/23  Hip flexion      Hip extension      Hip abduction      Hip adduction      Hip internal rotation      Hip external rotation      Knee flexion 80  98 AA 107 A  Knee extension 10 lacking     Ankle dorsiflexion      Ankle plantarflexion      Ankle inversion      Ankle eversion       (Blank rows = not tested)  LOWER EXTREMITY MMT:  MMT Right eval Left eval  Hip flexion 2   Hip extension    Hip abduction 2   Hip adduction    Hip internal rotation 2   Hip external rotation 2   Knee flexion 2   Knee extension 2   Ankle dorsiflexion    Ankle plantarflexion    Ankle inversion    Ankle eversion     (Blank rows = not tested)  LOWER EXTREMITY SPECIAL TESTS:  NT  FUNCTIONAL TESTS:  Complete and 5xSTS when pt is no longer needing an assist device with walking  GAIT: Distance walked: 12ft Assistive device utilized: Environmental consultant - 2 wheeled Level of assistance: Complete Independence Comments: Step to pattern c L LE   TODAY'S TREATMENT:    [facility] Adult PT Treatment:                                                DATE: 09/07/23 Therapeutic Exercise: Rec Bike no resist forward x 5 min Seated LAQ - quad deficit noted  Seated LAQ with strap assist on concentric  SAQ needs AA  Bridge x 12 Hooklying ball squeeze x12 Hooklying green band clam  Heel slides AROM QS into towel 5 sec - difficulty sustaining contraction  Mini squats at [employer] LE only for quad strengthening   Heel raises standing  Modalities: Ice pack right knee x 10 minutes     [facility] Adult PT Treatment:  DATE: 09/03/23 Therapeutic Exercise: Nustep L1 UE/LE x 4 min for ROM Rec Bike no resist- partial to full revolutions  x 2 minutes QS 5 sec x 10 into towel  SAQ AA 2-3 sec x 10 Heel slides AAROM STS working on foot placement.   Ice pack right knee x 10 minutes    [facility] Adult PT Treatment:                                                DATE: 08/28/23 Therapeutic Exercise: Developed, instructed in, and pt completed therex as noted in HEP Self Care: Proper technique to asc/dsc steps                                                                                                                             PATIENT EDUCATION:  Education details: Eval findings, POC, HEP, self care  Person educated: Patient Education method: Explanation, Demonstration, Tactile cues, Verbal cues, and Handouts Education comprehension: verbalized understanding, returned demonstration, verbal cues required, tactile cues required, and needs further education  HOME EXERCISE PROGRAM: Access Code: [email or web address] URL: [email or web address] Date: 08/28/2023 Prepared by: Joellyn Rued  Exercises - Supine Quad Set  - 3 x daily - 7 x weekly - 1 sets - 10 reps - 5 hold - Supine Heel Slide with Strap  - 3 x daily - 7 x weekly - 1 sets - 10 reps - 5 hold - Supine Knee Extension Strengthening  - 3 x daily - 7 x weekly - 1 sets - 10 reps - 3 hold - Sit to Stand with Counter Support  - 3 x daily - 7 x weekly - 1 sets - 5 reps  ASSESSMENT:  CLINICAL IMPRESSION: Continued ankle pain with ambulation rated at 8-9/10. She will see surgeon for follow up tomorrow re: knee and he also treats her for her ankle. She continues to have significant quad lag and difficulty sustaining quad contraction. She reports knee gives away and she is dependent on RW for safety.  Her AROM has  improved, reaching 107 actively.  Pt will continue to benefit from skilled PT to address impairments to optimize function with less pain.    EVAL: Patient is a 62 y.o. female who was seen today for physical therapy evaluation and treatment for  R53.1 (ICD-10-CM) - Weakness  Z96.659 (ICD-10-CM) - S/P knee replacement  .Pt presents to PT walking c a RW, WBAT R LE, mod Ind. For 1 week s/p R TKR surgery, the pt's demonstrates good AAROM. Strength is limited with pt not being able to complete a SAQ or SLR. A HEP was initiated to address R knee/LE ROM and strength with pt returning demonstration. Pt will benefit from skilled PT 2w8 to address impairments to optimize function with less pain.   OBJECTIVE IMPAIRMENTS: decreased activity tolerance, decreased mobility,  difficulty walking, decreased ROM, decreased strength, increased edema, and pain.   ACTIVITY LIMITATIONS: carrying, lifting, bending, sitting, standing, squatting, sleeping, stairs, transfers, bed mobility, bathing, toileting, dressing, self feeding, hygiene/grooming, locomotion level, and caring for others  PARTICIPATION LIMITATIONS: meal prep, cleaning, laundry, driving, shopping, and community activity  PERSONAL FACTORS: Past/current experiences and Time since onset of injury/illness/exacerbation are also affecting patient's functional outcome.   REHAB POTENTIAL: Excellent  CLINICAL DECISION MAKING: Stable/uncomplicated  EVALUATION COMPLEXITY: Low   GOALS:  SHORT TERM GOALS: Target date: 09/18/23 Pt will be Ind in an initial HEP  Baseline: started Goal status: MET  2.  Increase L knee AROM to 5-100d to improve R knee function Baseline: 10-80d 09/07/23: significant quad lag Goal status: PARTIALLY MET (for flexion)   3.  Pt will be able to complete a SAQ and SLR independently Baseline: unable 09/07/23: unable Goal status: ONGOING  LONG TERM GOALS: Target date: 10/30/23  Pt will be Ind in a final HEP to maintain achieved  LOF Baseline: started Goal status: INITIAL  2.  Increase L knee AROM to 0-115d to for appropriate R knee function Baseline: 10-80d Goal status: INITIAL  3.  Increased R hip and knee strength to 4/5 or greater for appropriate R knee function Baseline:  Goal status: INITIAL  4.  Pt will be able to walk 878ft Indly and asc/dsc 12 steps c a HR and mod Ind for community mobility  Baseline:  Goal status: INITIAL  5.  Improve 5xSTS by MCID of 5" and by MCID of 76ft as indication of improved functional mobility  Baseline: Complete and 5xSTS when pt is no longer needing an assist device with walking Goal status: INITIAL  6.  Pt's FOTO score will improved to the predicted value of 51% as indication of improved function  Baseline: 39% Goal status: INITIAL   PLAN:  PT FREQUENCY: 2x/week  PT DURATION: 8 weeks  PLANNED INTERVENTIONS: Therapeutic exercises, Therapeutic activity, Neuromuscular re-education, Balance training, Gait training, Patient/Family education, Self Care, Joint mobilization, Stair training, Aquatic Therapy, Dry Needling, Electrical stimulation, Cryotherapy, Moist heat, Taping, Vasopneumatic device, Ultrasound, Ionotophoresis 4mg /ml Dexamethasone, Manual therapy, and Re-evaluation  PLAN FOR NEXT SESSION: Review FOTO; assess response to HEP; progress therex as indicated; use of modalities, manual therapy; and TPDN as indicated.    Jannette Spanner, PTA 09/07/23 10:11 AM Phone: [phone] Fax: [phone]   [facility] Authorization   Choose one: Rehabilitative  Standardized Assessment or Functional Outcome Tool: See Pain Assessment and Other FOTO  Score or Percent Disability: 39  Body Parts Treated (Select each separately):  Knee. Overall deficits/functional limitations for body part selected: moderate   If treatment provided at initial evaluation, no treatment charged due to lack of authorization.

## 2023-09-08 ENCOUNTER — Ambulatory Visit: Payer: Medicaid Other | Admitting: Orthopedic Surgery

## 2023-09-08 ENCOUNTER — Other Ambulatory Visit (INDEPENDENT_AMBULATORY_CARE_PROVIDER_SITE_OTHER): Payer: Medicaid Other

## 2023-09-08 DIAGNOSIS — Z96651 Presence of right artificial knee joint: Secondary | ICD-10-CM | POA: Diagnosis not present

## 2023-09-08 DIAGNOSIS — M25571 Pain in right ankle and joints of right foot: Secondary | ICD-10-CM | POA: Diagnosis not present

## 2023-09-09 ENCOUNTER — Telehealth: Payer: Self-pay

## 2023-09-09 ENCOUNTER — Encounter: Payer: Self-pay | Admitting: Physical Therapy

## 2023-09-09 ENCOUNTER — Ambulatory Visit: Payer: Medicaid Other | Admitting: Physical Therapy

## 2023-09-09 DIAGNOSIS — R262 Difficulty in walking, not elsewhere classified: Secondary | ICD-10-CM

## 2023-09-09 DIAGNOSIS — M25561 Pain in right knee: Secondary | ICD-10-CM | POA: Diagnosis not present

## 2023-09-09 DIAGNOSIS — G8929 Other chronic pain: Secondary | ICD-10-CM | POA: Diagnosis not present

## 2023-09-09 DIAGNOSIS — M6281 Muscle weakness (generalized): Secondary | ICD-10-CM | POA: Diagnosis not present

## 2023-09-09 LAB — URIC ACID: Uric Acid, Serum: 5.6 mg/dL (ref 2.5–7.0)

## 2023-09-09 NOTE — Therapy (Signed)
OUTPATIENT PHYSICAL THERAPY LOWER EXTREMITY TREATMENT   Patient Name: Sandra Lozano MRN: [id number] DOB:[birth year], 62 y.o., female Today's Date: 09/09/2023  END OF SESSION:  PT End of Session - 09/09/23 0850     Visit Number 4    Number of Visits 17    Date for PT Re-Evaluation 10/30/23    Authorization Type [place] MEDICAID [facility]    Authorization Time Period 08/31/23-10/30/23    Authorization - Visit Number 3    Authorization - Number of Visits 12    PT Start Time 0845             Past Medical History:  Diagnosis Date   Arthritis    right knee, lower back   Chest wall pain 04/20/2021   Dyspnea    very rare -tx with albuterol neb sol if needed   Hyperlipidemia    Hypertension    Intertrigo 08/06/2022   Pre-diabetes    per patient - does not check blood sugar   Right hip pain 05/05/2018   Rupture of anterior cruciate ligament of right knee 02/15/2018   Sprain of medial collateral ligament of right knee 02/15/2018   SVD (spontaneous vaginal delivery)    x 5 - only 2 living, 1 stillborn and 1 premature at 7 months demise,1 child deceased   Wears dentures    upper and lower   Past Surgical History:  Procedure Laterality Date   FOOT ARTHRODESIS Left 11/02/2020   Procedure: LEFT MIDFOOT FUSION BASE 1ST AND 2ND METATARSAL;  Surgeon: Nadara Mustard, MD;  Location: [facility] OR;  Service: Orthopedics;  Laterality: Left;   LUMBAR FUSION  05/20/2019   GILL PROCEDURE, LEFT TRANSFORAMINAL LUMBAR INTERBODY FUSION, PEDICLE INSTRUMENTATION, BILATERAL FUSION (N/A   TOTAL KNEE ARTHROPLASTY Right 08/21/2023   Procedure: RIGHT TOTAL KNEE ARTHROPLASTY;  Surgeon: Nadara Mustard, MD;  Location: [facility] OR;  Service: Orthopedics;  Laterality: Right;   TUBAL LIGATION     Patient Active Problem List   Diagnosis Date Noted   Unilateral primary osteoarthritis, right knee 08/21/2023   Total knee replacement status, right 08/21/2023   Hidradenitis suppurativa of right axilla 06/03/2023   Vaginal  irritation 02/21/2023   Skin lesion of back 02/21/2023   Tinea 12/18/2022   Left shoulder pain 08/25/2022   DJD (degenerative joint disease), multiple sites 08/06/2022   COPD, mild (HCC) 02/11/2022   Carpal tunnel syndrome, bilateral 12/21/2021   Arthritis of finger of left hand 11/26/2021   Traumatic arthritis of left foot    Lichen simplex chronicus 06/04/2020   Arthritis of midfoot 03/14/2020   History of lumbar fusion 03/14/2020   Well controlled diabetes mellitus (HCC) 11/07/2019   Nummular eczema 07/06/2019   Lumbar stenosis 05/20/2019   Acute left-sided low back pain without sciatica 07/18/2018   Osteoarthritis of right hip 06/03/2018   Chronic right shoulder pain 05/05/2018   Essential hypertension 02/15/2018    PCP: Lockie Mola, MD   REFERRING PROVIDER: Nadara Mustard, MD   REFERRING DIAG:  R53.1 (ICD-10-CM) - Weakness  Z96.659 (ICD-10-CM) - S/P knee replacement    THERAPY DIAG:  No diagnosis found.  Rationale for Evaluation and Treatment: Rehabilitation  ONSET DATE: 08/21/23 surgery  SUBJECTIVE:   SUBJECTIVE STATEMENT: Ankle pain 8/10. Knee pain  3-4/10. Saw MD yesterday, had staples removed. He did blood work to check for gout in my ankle. I go back in 2 weeks.     PERTINENT HISTORY: HTN  PAIN:  Are you having pain? Yes: NPRS scale:  3-4/10 Pain location: R knee Pain description: ache, discomfort Aggravating factors: Moving, walking Relieving factors: Ice man, tylenol  PRECAUTIONS: Knee  RED FLAGS: None   WEIGHT BEARING RESTRICTIONS: No WBAT  FALLS:  Has patient fallen in last 6 months? No  LIVING ENVIRONMENT: Lives with: lives with their family Lives in: House/apartment Stairs: Yes: External: 14 steps; can reach both   OCCUPATION: Not working  PLOF: Independent with household mobility with device and Independent with community mobility with device  PATIENT GOALS: Good use of my R knee  NEXT MD VISIT: 09/08/23  OBJECTIVE:    PATIENT SURVEYS:  FOTO: Perceived function   39%, predicted   51%    COGNITION: Overall cognitive status: Within functional limits for tasks assessed     SENSATION: WFL  EDEMA:  Sweeling of the r knee  MUSCLE LENGTH: Hamstrings: Right NT deg; Left NT deg Maisie Fus test: Right NT deg; Left NT deg  POSTURE: No Significant postural limitations  PALPATION: TTP of the peri-R knee and thigh  LOWER EXTREMITY ROM:  Active assisted ROM Right eval Left eval Right  Right 09/07/23  Hip flexion      Hip extension      Hip abduction      Hip adduction      Hip internal rotation      Hip external rotation      Knee flexion 80  98 AA 107 A  Knee extension 10 lacking     Ankle dorsiflexion      Ankle plantarflexion      Ankle inversion      Ankle eversion       (Blank rows = not tested)  LOWER EXTREMITY MMT:  MMT Right eval Left eval  Hip flexion 2   Hip extension    Hip abduction 2   Hip adduction    Hip internal rotation 2   Hip external rotation 2   Knee flexion 2   Knee extension 2   Ankle dorsiflexion    Ankle plantarflexion    Ankle inversion    Ankle eversion     (Blank rows = not tested)  LOWER EXTREMITY SPECIAL TESTS:  NT  FUNCTIONAL TESTS:  Complete and 5xSTS when pt is no longer needing an assist device with walking  GAIT: Distance walked: 18ft Assistive device utilized: Environmental consultant - 2 wheeled Level of assistance: Complete Independence Comments: Step to pattern c L LE   TODAY'S TREATMENT:    [facility] Adult PT Treatment:                                                DATE: 09/09/23 Therapeutic Exercise: Nustep  LE only x 5 minutes  Heel raises TKE ball on wall Retro stepping weight shift for quad  LAQ 5 sec x 15 - quad deficit Mini squats at [employer]- focusing on weight to RLE Bilat QS x 15 Bridge x 10 focusing on weight to RLE Heel slides AA Hook lying ball squeeze  SAQ  Modalities: Ice pack left knee x 10 minutes     [facility] Adult PT  Treatment:  DATE: 09/07/23 Therapeutic Exercise: Rec Bike no resist forward x 5 min Seated LAQ - quad deficit noted  Seated LAQ with strap assist on concentric  SAQ needs AA  Bridge x 12 Hooklying ball squeeze x12 Hooklying green band clam  Heel slides AROM QS into towel 5 sec - difficulty sustaining contraction  Mini squats at [employer] LE only for quad strengthening  Heel raises standing  Modalities: Ice pack right knee x 10 minutes     [facility] Adult PT Treatment:                                                DATE: 09/03/23 Therapeutic Exercise: Nustep L1 UE/LE x 4 min for ROM Rec Bike no resist- partial to full revolutions  x 2 minutes QS 5 sec x 10 into towel  SAQ AA 2-3 sec x 10 Heel slides AAROM STS working on foot placement.   Ice pack right knee x 10 minutes    [facility] Adult PT Treatment:                                                DATE: 08/28/23 Therapeutic Exercise: Developed, instructed in, and pt completed therex as noted in HEP Self Care: Proper technique to asc/dsc steps                                                                                                                             PATIENT EDUCATION:  Education details: Eval findings, POC, HEP, self care  Person educated: Patient Education method: Explanation, Demonstration, Tactile cues, Verbal cues, and Handouts Education comprehension: verbalized understanding, returned demonstration, verbal cues required, tactile cues required, and needs further education  HOME EXERCISE PROGRAM: Access Code: [email or web address] URL: [email or web address] Date: 08/28/2023 Prepared by: Joellyn Rued  Exercises - Supine Quad Set  - 3 x daily - 7 x weekly - 1 sets - 10 reps - 5 hold - Supine Heel Slide with Strap  - 3 x daily - 7 x weekly - 1 sets - 10 reps - 5 hold - Supine Knee Extension Strengthening  - 3 x daily - 7 x weekly - 1 sets - 10 reps - 3 hold - Sit  to Stand with Counter Support  - 3 x daily - 7 x weekly - 1 sets - 5 reps  ASSESSMENT:  CLINICAL IMPRESSION: Continued ankle pain with ambulation rated at 8-9/10. She saw surgeon yesterday for follow up, he removed knee staples and look labs to check ankle. Will follow up in 2 weeks. Continued with quad activation focus today. Flexion ROM remains at 107.  She continues to have significant quad lag and difficulty sustaining quad contraction. Pt  will continue to benefit from skilled PT to address impairments to optimize function with less pain.    EVAL: Patient is a 62 y.o. female who was seen today for physical therapy evaluation and treatment for  R53.1 (ICD-10-CM) - Weakness  Z96.659 (ICD-10-CM) - S/P knee replacement  .Pt presents to PT walking c a RW, WBAT R LE, mod Ind. For 1 week s/p R TKR surgery, the pt's demonstrates good AAROM. Strength is limited with pt not being able to complete a SAQ or SLR. A HEP was initiated to address R knee/LE ROM and strength with pt returning demonstration. Pt will benefit from skilled PT 2w8 to address impairments to optimize function with less pain.   OBJECTIVE IMPAIRMENTS: decreased activity tolerance, decreased mobility, difficulty walking, decreased ROM, decreased strength, increased edema, and pain.   ACTIVITY LIMITATIONS: carrying, lifting, bending, sitting, standing, squatting, sleeping, stairs, transfers, bed mobility, bathing, toileting, dressing, self feeding, hygiene/grooming, locomotion level, and caring for others  PARTICIPATION LIMITATIONS: meal prep, cleaning, laundry, driving, shopping, and community activity  PERSONAL FACTORS: Past/current experiences and Time since onset of injury/illness/exacerbation are also affecting patient's functional outcome.   REHAB POTENTIAL: Excellent  CLINICAL DECISION MAKING: Stable/uncomplicated  EVALUATION COMPLEXITY: Low   GOALS:  SHORT TERM GOALS: Target date: 09/18/23 Pt will be Ind in an initial  HEP  Baseline: started Goal status: MET  2.  Increase L knee AROM to 5-100d to improve R knee function Baseline: 10-80d 09/07/23: significant quad lag Goal status: PARTIALLY MET (for flexion)   3.  Pt will be able to complete a SAQ and SLR independently Baseline: unable 09/07/23: unable Goal status: ONGOING  LONG TERM GOALS: Target date: 10/30/23  Pt will be Ind in a final HEP to maintain achieved LOF Baseline: started Goal status: INITIAL  2.  Increase L knee AROM to 0-115d to for appropriate R knee function Baseline: 10-80d Goal status: INITIAL  3.  Increased R hip and knee strength to 4/5 or greater for appropriate R knee function Baseline:  Goal status: INITIAL  4.  Pt will be able to walk 865ft Indly and asc/dsc 12 steps c a HR and mod Ind for community mobility  Baseline:  Goal status: INITIAL  5.  Improve 5xSTS by MCID of 5" and by MCID of 67ft as indication of improved functional mobility  Baseline: Complete and 5xSTS when pt is no longer needing an assist device with walking Goal status: INITIAL  6.  Pt's FOTO score will improved to the predicted value of 51% as indication of improved function  Baseline: 39% Goal status: INITIAL   PLAN:  PT FREQUENCY: 2x/week  PT DURATION: 8 weeks  PLANNED INTERVENTIONS: Therapeutic exercises, Therapeutic activity, Neuromuscular re-education, Balance training, Gait training, Patient/Family education, Self Care, Joint mobilization, Stair training, Aquatic Therapy, Dry Needling, Electrical stimulation, Cryotherapy, Moist heat, Taping, Vasopneumatic device, Ultrasound, Ionotophoresis 4mg /ml Dexamethasone, Manual therapy, and Re-evaluation  PLAN FOR NEXT SESSION: Review FOTO; assess response to HEP; progress therex as indicated; use of modalities, manual therapy; and TPDN as indicated.    Jannette Spanner, PTA 09/09/23 8:50 AM Phone: [phone] Fax: [phone]   [facility] Authorization   Choose one:  Rehabilitative  Standardized Assessment or Functional Outcome Tool: See Pain Assessment and Other FOTO  Score or Percent Disability: 39  Body Parts Treated (Select each separately):  Knee. Overall deficits/functional limitations for body part selected: moderate   If treatment provided at initial evaluation, no treatment charged due to lack of authorization.

## 2023-09-09 NOTE — Telephone Encounter (Signed)
-----   Message from Nadara Mustard sent at 09/09/2023  7:18 AM EDT ----- Call patient.  Uric acid 5.6 within the range of normal.  Do not need gout medicine at this time.  Will need to repeat the uric acid at further follow-up. ----- Message ----- From: Interface, [facility] Lab Results In Sent: 09/09/2023  12:56 AM EDT To: Nadara Mustard, MD

## 2023-09-09 NOTE — Telephone Encounter (Signed)
I called pt and advised of message below. She states that she is having ankle pain and she is wearing her compression sock for the swelling she takes Mobic for arthritis and she does not know what she is supposed to do now if it is not gout. You saw this pt in the office yesterday please advise.

## 2023-09-09 NOTE — Telephone Encounter (Signed)
SW pt, she stated that she already had a cortisone injection 2-3 weeks before her knee surgery with another provider and that didn't help her pain/swelling. I did reschedule her appt for next week instead of two weeks per pt request.

## 2023-09-13 ENCOUNTER — Encounter: Payer: Self-pay | Admitting: Orthopedic Surgery

## 2023-09-13 NOTE — Progress Notes (Signed)
Office Visit Note   Patient: Sandra Lozano           Date of Birth: 27-Dec-1960           MRN: [id number] Visit Date: 09/08/2023              Requested by: Lockie Mola, MD [address] [place],  [place] [address] PCP: Lockie Mola, MD  Chief Complaint  Patient presents with   Right Knee - Routine Post Op    08/31/23 right TKA      HPI: Patient is a 62 year old woman who is status post right total knee arthroplasty she also complains of dorsal right ankle pain.  Patient is seen for 2 separate issues.  Patient states she went to podiatry and was told she had arthritis.  She states she was given a steroid injection in her ankle 2 weeks ago with podiatry.  Patient has no family history of gout.  Assessment & Plan: Visit Diagnoses:  1. Pain in right ankle and joints of right foot   2. Status post right knee replacement     Plan: Patient has a stable total knee we will harvest the staples today.  Patient's ankle symptoms may be consistent with gout.  Will check a uric acid level today.  Uric acid is 5.6.  Recommend using Voltaren gel on the foot and ankle for pain May need to repeat a uric acid level in the future.  Follow-Up Instructions: Return in about 2 weeks (around 09/22/2023).   Ortho Exam  Patient is alert, oriented, no adenopathy, well-dressed, normal affect, normal respiratory effort. Examination patient's right total knee is healing well Staples are harvested.  Patient does have generalized tenderness dorsally over the ankle as well as dorsally over the midfoot, review of her previous radiographs shows a congruent tibiotalar joint without cystic changes.  Imaging: No results found..  T No images are attached to the encounter.  Labs: Lab Results  Component Value Date   HGBA1C 5.5 07/01/2023   HGBA1C 5.7 12/18/2022   HGBA1C 6.4 08/19/2022   ESRSEDRATE 11 11/26/2021   LABURIC 5.6 09/08/2023   LABURIC 5.2 11/26/2021     Lab Results  Component Value Date    ALBUMIN 4.0 05/18/2019    No results found for: "MG" No results found for: "VD25OH"  No results found for: "PREALBUMIN"    Latest Ref Rng & Units 08/13/2023    9:10 AM 07/01/2023    9:06 AM 11/02/2020    8:05 AM  CBC EXTENDED  WBC 4.0 - 10.5 K/uL 6.0  6.1    RBC 3.87 - 5.11 MIL/uL 4.57  4.38    Hemoglobin 12.0 - 15.0 g/dL 76.7  34.1  93.7   HCT 36.0 - 46.0 % 40.7  38.5  43.0   Platelets 150 - 400 K/uL 272  296       There is no height or weight on file to calculate BMI.  Orders:  Orders Placed This Encounter  Procedures   XR Knee 1-2 Views Right   Uric acid   No orders of the defined types were placed in this encounter.    Procedures: No procedures performed  Clinical Data: No additional findings.  ROS:  All other systems negative, except as noted in the HPI. Review of Systems  Objective: Vital Signs: LMP  (LMP Unknown)   Specialty Comments:  EXAM: MRI LUMBAR SPINE WITHOUT CONTRAST   TECHNIQUE: Multiplanar, multisequence MR imaging of the lumbar spine was performed.  No intravenous contrast was administered.   COMPARISON:  Lumbar radiographs 09/28/2018   FINDINGS: Segmentation:  Normal   Alignment:  7 mm anterolisthesis L4-5.  Remaining alignment normal   Vertebrae: Negative for fracture or mass. Bone marrow edema at L4-5 related to discogenic change.   Conus medullaris and cauda equina: Conus extends to the L1-2 level. Conus and cauda equina appear normal.   Paraspinal and other soft tissues: Negative for paraspinous mass or fluid collection   Disc levels:   L1-2: Negative   L2-3: Negative   L3-4: Mild facet degeneration. Negative for disc protrusion or stenosis   L4-5: 7 mm anterolisthesis. Severe facet degeneration bilaterally. Severe disc degeneration with endplate erosive change and edema. Severe spinal stenosis. Severe subarticular foraminal stenosis left greater than right   L5-S1: Negative   IMPRESSION: Severe spinal  stenosis L4-5 with severe subarticular foraminal stenosis bilaterally left greater than right. Grade 1 anterolisthesis L4-5. Remaining lumbar spine negative     Electronically Signed   By: Marlan Palau M.D.   On: 11/13/2018 15:34  PMFS History: Patient Active Problem List   Diagnosis Date Noted   Unilateral primary osteoarthritis, right knee 08/21/2023   Total knee replacement status, right 08/21/2023   Hidradenitis suppurativa of right axilla 06/03/2023   Vaginal irritation 02/21/2023   Skin lesion of back 02/21/2023   Tinea 12/18/2022   Left shoulder pain 08/25/2022   DJD (degenerative joint disease), multiple sites 08/06/2022   COPD, mild (HCC) 02/11/2022   Carpal tunnel syndrome, bilateral 12/21/2021   Arthritis of finger of left hand 11/26/2021   Traumatic arthritis of left foot    Lichen simplex chronicus 06/04/2020   Arthritis of midfoot 03/14/2020   History of lumbar fusion 03/14/2020   Well controlled diabetes mellitus (HCC) 11/07/2019   Nummular eczema 07/06/2019   Lumbar stenosis 05/20/2019   Acute left-sided low back pain without sciatica 07/18/2018   Osteoarthritis of right hip 06/03/2018   Chronic right shoulder pain 05/05/2018   Essential hypertension 02/15/2018   Past Medical History:  Diagnosis Date   Arthritis    right knee, lower back   Chest wall pain 04/20/2021   Dyspnea    very rare -tx with albuterol neb sol if needed   Hyperlipidemia    Hypertension    Intertrigo 08/06/2022   Pre-diabetes    per patient - does not check blood sugar   Right hip pain 05/05/2018   Rupture of anterior cruciate ligament of right knee 02/15/2018   Sprain of medial collateral ligament of right knee 02/15/2018   SVD (spontaneous vaginal delivery)    x 5 - only 2 living, 1 stillborn and 1 premature at 7 months demise,1 child deceased   Wears dentures    upper and lower    Family History  Problem Relation Age of Onset   Cancer Sister    Healthy Daughter     Healthy Daughter     Past Surgical History:  Procedure Laterality Date   FOOT ARTHRODESIS Left 11/02/2020   Procedure: LEFT MIDFOOT FUSION BASE 1ST AND 2ND METATARSAL;  Surgeon: Nadara Mustard, MD;  Location: [facility] OR;  Service: Orthopedics;  Laterality: Left;   LUMBAR FUSION  05/20/2019   GILL PROCEDURE, LEFT TRANSFORAMINAL LUMBAR INTERBODY FUSION, PEDICLE INSTRUMENTATION, BILATERAL FUSION (N/A   TOTAL KNEE ARTHROPLASTY Right 08/21/2023   Procedure: RIGHT TOTAL KNEE ARTHROPLASTY;  Surgeon: Nadara Mustard, MD;  Location: [facility] OR;  Service: Orthopedics;  Laterality: Right;   TUBAL LIGATION  Social History   Occupational History   Not on file  Tobacco Use   Smoking status: Former    Current packs/day: 0.00    Average packs/day: 1 pack/day for 30.0 years (30.0 ttl pk-yrs)    Types: Cigarettes    Start date: 01/1985    Quit date: 01/2015    Years since quitting: 8.6    Passive exposure: Past   Smokeless tobacco: Never  Vaping Use   Vaping status: Never Used  Substance and Sexual Activity   Alcohol use: Yes    Comment: occ   Drug use: Yes    Types: Marijuana   Sexual activity: Not on file

## 2023-09-14 ENCOUNTER — Ambulatory Visit: Payer: Medicaid Other | Admitting: Physical Therapy

## 2023-09-14 ENCOUNTER — Encounter: Payer: Self-pay | Admitting: Physical Therapy

## 2023-09-14 DIAGNOSIS — M25561 Pain in right knee: Secondary | ICD-10-CM | POA: Diagnosis not present

## 2023-09-14 DIAGNOSIS — G8929 Other chronic pain: Secondary | ICD-10-CM

## 2023-09-14 DIAGNOSIS — M6281 Muscle weakness (generalized): Secondary | ICD-10-CM | POA: Diagnosis not present

## 2023-09-14 DIAGNOSIS — R262 Difficulty in walking, not elsewhere classified: Secondary | ICD-10-CM | POA: Diagnosis not present

## 2023-09-14 NOTE — Therapy (Signed)
OUTPATIENT PHYSICAL THERAPY LOWER EXTREMITY TREATMENT   Patient Name: Sandra Lozano MRN: [id number] DOB:May 11, 1961, 62 y.o., female Today's Date: 09/14/2023  END OF SESSION:    Past Medical History:  Diagnosis Date   Arthritis    right knee, lower back   Chest wall pain 04/20/2021   Dyspnea    very rare -tx with albuterol neb sol if needed   Hyperlipidemia    Hypertension    Intertrigo 08/06/2022   Pre-diabetes    per patient - does not check blood sugar   Right hip pain 05/05/2018   Rupture of anterior cruciate ligament of right knee 02/15/2018   Sprain of medial collateral ligament of right knee 02/15/2018   SVD (spontaneous vaginal delivery)    x 5 - only 2 living, 1 stillborn and 1 premature at 7 months demise,1 child deceased   Wears dentures    upper and lower   Past Surgical History:  Procedure Laterality Date   FOOT ARTHRODESIS Left 11/02/2020   Procedure: LEFT MIDFOOT FUSION BASE 1ST AND 2ND METATARSAL;  Surgeon: Nadara Mustard, MD;  Location: [facility] OR;  Service: Orthopedics;  Laterality: Left;   LUMBAR FUSION  05/20/2019   GILL PROCEDURE, LEFT TRANSFORAMINAL LUMBAR INTERBODY FUSION, PEDICLE INSTRUMENTATION, BILATERAL FUSION (N/A   TOTAL KNEE ARTHROPLASTY Right 08/21/2023   Procedure: RIGHT TOTAL KNEE ARTHROPLASTY;  Surgeon: Nadara Mustard, MD;  Location: [facility] OR;  Service: Orthopedics;  Laterality: Right;   TUBAL LIGATION     Patient Active Problem List   Diagnosis Date Noted   Unilateral primary osteoarthritis, right knee 08/21/2023   Total knee replacement status, right 08/21/2023   Hidradenitis suppurativa of right axilla 06/03/2023   Vaginal irritation 02/21/2023   Skin lesion of back 02/21/2023   Tinea 12/18/2022   Left shoulder pain 08/25/2022   DJD (degenerative joint disease), multiple sites 08/06/2022   COPD, mild (HCC) 02/11/2022   Carpal tunnel syndrome, bilateral 12/21/2021   Arthritis of finger of left hand 11/26/2021   Traumatic arthritis of left  foot    Lichen simplex chronicus 06/04/2020   Arthritis of midfoot 03/14/2020   History of lumbar fusion 03/14/2020   Well controlled diabetes mellitus (HCC) 11/07/2019   Nummular eczema 07/06/2019   Lumbar stenosis 05/20/2019   Acute left-sided low back pain without sciatica 07/18/2018   Osteoarthritis of right hip 06/03/2018   Chronic right shoulder pain 05/05/2018   Essential hypertension 02/15/2018    PCP: Lockie Mola, MD   REFERRING PROVIDER: Nadara Mustard, MD   REFERRING DIAG:  R53.1 (ICD-10-CM) - Weakness  Z96.659 (ICD-10-CM) - S/P knee replacement    THERAPY DIAG:  No diagnosis found.  Rationale for Evaluation and Treatment: Rehabilitation  ONSET DATE: 08/21/23 surgery  SUBJECTIVE:   SUBJECTIVE STATEMENT: Ankle pain 3/10. Knee pain  2-3/10. Blood work shows that I do not have Gout. I fell this morning.      PERTINENT HISTORY: HTN  PAIN:  Are you having pain? Yes: NPRS scale: 2-3/10 Pain location: R knee Pain description: ache, discomfort Aggravating factors: Moving, walking Relieving factors: Ice man, tylenol  PRECAUTIONS: Knee  RED FLAGS: None   WEIGHT BEARING RESTRICTIONS: No WBAT  FALLS:  Has patient fallen in last 6 months? No  LIVING ENVIRONMENT: Lives with: lives with their family Lives in: House/apartment Stairs: Yes: External: 14 steps; can reach both   OCCUPATION: Not working  PLOF: Independent with household mobility with device and Independent with community mobility with device  PATIENT GOALS: Good  use of my R knee  NEXT MD VISIT: 09/08/23  OBJECTIVE:   PATIENT SURVEYS:  FOTO: Perceived function   39%, predicted   51%    COGNITION: Overall cognitive status: Within functional limits for tasks assessed     SENSATION: WFL  EDEMA:  Sweeling of the r knee  MUSCLE LENGTH: Hamstrings: Right NT deg; Left NT deg Maisie Fus test: Right NT deg; Left NT deg  POSTURE: No Significant postural limitations  PALPATION: TTP  of the peri-R knee and thigh  LOWER EXTREMITY ROM:  Active assisted ROM Right eval Left eval Right  Right 09/07/23 Right  09/14/23  Hip flexion       Hip extension       Hip abduction       Hip adduction       Hip internal rotation       Hip external rotation       Knee flexion 80  98 AA 107 A 108 AA  Knee extension 10 lacking    -20 quad lag  Ankle dorsiflexion       Ankle plantarflexion       Ankle inversion       Ankle eversion        (Blank rows = not tested)  LOWER EXTREMITY MMT:  MMT Right eval Left eval  Hip flexion 2   Hip extension    Hip abduction 2   Hip adduction    Hip internal rotation 2   Hip external rotation 2   Knee flexion 2   Knee extension 2   Ankle dorsiflexion    Ankle plantarflexion    Ankle inversion    Ankle eversion     (Blank rows = not tested)  LOWER EXTREMITY SPECIAL TESTS:  NT  FUNCTIONAL TESTS:  Complete and 5xSTS when pt is no longer needing an assist device with walking  GAIT: Distance walked: 156ft Assistive device utilized: Environmental consultant - 2 wheeled Level of assistance: Complete Independence Comments: Step to pattern c L LE   TODAY'S TREATMENT:  [facility] Adult PT Treatment:                                                DATE: 09/16/23 Therapeutic Exercise: *** Manual Therapy: *** Neuromuscular re-ed: *** Therapeutic Activity: *** Modalities: *** Self Care: ***    [facility] PT Treatment:                                                DATE: 09/14/23 Therapeutic Exercise: Nustep LE only x 5 min Heel raises Retro stepping weight shift for quad Mini squats at RW x 10 Seated heel slide to [employer] set 3 sec x 10 Standing TKE green band x12 for 5 sec Seated LAQ x 15- quad deficit improved, still present SLR x 10 with initial quad set, quad lag -20 Heel slides with strap  Supine ball squeeze with QS 3 sec x 15   [facility] Adult PT Treatment:  DATE:  09/09/23 Therapeutic Exercise: Nustep  LE only x 5 minutes  Heel raises TKE ball on wall Retro stepping weight shift for quad  LAQ 5 sec x 15 - quad deficit Mini squats at RW- focusing on weight to RLE Bilat QS x 15 Bridge x 10 focusing on weight to RLE Heel slides AA Hook lying ball squeeze  SAQ 5 sec x 15   Modalities: Ice pack left knee x 10 minutes     [facility] Adult PT Treatment:                                                DATE: 09/07/23 Therapeutic Exercise: Rec Bike no resist forward x 5 min Seated LAQ - quad deficit noted  Seated LAQ with strap assist on concentric  SAQ needs AA  Bridge x 12 Hooklying ball squeeze x12 Hooklying green band clam  Heel slides AROM QS into towel 5 sec - difficulty sustaining contraction  Mini squats at [employer] LE only for quad strengthening  Heel raises standing  Modalities: Ice pack right knee x 10 minutes     [facility] Adult PT Treatment:                                                DATE: 09/03/23 Therapeutic Exercise: Nustep L1 UE/LE x 4 min for ROM Rec Bike no resist- partial to full revolutions  x 2 minutes QS 5 sec x 10 into towel  SAQ AA 2-3 sec x 10 Heel slides AAROM STS working on foot placement.   Ice pack right knee x 10 minutes    [facility] Adult PT Treatment:                                                DATE: 08/28/23 Therapeutic Exercise: Developed, instructed in, and pt completed therex as noted in HEP Self Care: Proper technique to asc/dsc steps                                                                                                                             PATIENT EDUCATION:  Education details: Eval findings, POC, HEP, self care  Person educated: Patient Education method: Explanation, Demonstration, Tactile cues, Verbal cues, and Handouts Education comprehension: verbalized understanding, returned demonstration, verbal cues required, tactile cues required, and needs further education  HOME EXERCISE  PROGRAM: Access Code: [email or web address] URL: [email or web address] Date: 08/28/2023 Prepared by: Joellyn Rued  Exercises - Supine Quad Set  - 3 x daily - 7 x weekly - 1 sets - 10 reps -  5 hold - Supine Heel Slide with Strap  - 3 x daily - 7 x weekly - 1 sets - 10 reps - 5 hold - Supine Knee Extension Strengthening  - 3 x daily - 7 x weekly - 1 sets - 10 reps - 3 hold - Sit to Stand with Counter Support  - 3 x daily - 7 x weekly - 1 sets - 5 reps  ASSESSMENT:  CLINICAL IMPRESSION: Less ankle pain with ambulation today. Pt reports ankle pain could have been due to the rainy weather last week. She did fall this morning when she was carrying her laundry and using a SPC indoor, denies injury was able to get up using the couch for assist. She continues to demo significant quad lag, although it is improved compared to last week, measuring at -20 degrees today. She understands that she should continue to use RW full time due to quad weakness.  Pt will continue to benefit from skilled PT to address impairments to optimize function with less pain.    EVAL: Patient is a 62 y.o. female who was seen today for physical therapy evaluation and treatment for  R53.1 (ICD-10-CM) - Weakness  Z96.659 (ICD-10-CM) - S/P knee replacement  .Pt presents to PT walking c a RW, WBAT R LE, mod Ind. For 1 week s/p R TKR surgery, the pt's demonstrates good AAROM. Strength is limited with pt not being able to complete a SAQ or SLR. A HEP was initiated to address R knee/LE ROM and strength with pt returning demonstration. Pt will benefit from skilled PT 2w8 to address impairments to optimize function with less pain.   OBJECTIVE IMPAIRMENTS: decreased activity tolerance, decreased mobility, difficulty walking, decreased ROM, decreased strength, increased edema, and pain.   ACTIVITY LIMITATIONS: carrying, lifting, bending, sitting, standing, squatting, sleeping, stairs, transfers, bed mobility, bathing, toileting,  dressing, self feeding, hygiene/grooming, locomotion level, and caring for others  PARTICIPATION LIMITATIONS: meal prep, cleaning, laundry, driving, shopping, and community activity  PERSONAL FACTORS: Past/current experiences and Time since onset of injury/illness/exacerbation are also affecting patient's functional outcome.   REHAB POTENTIAL: Excellent  CLINICAL DECISION MAKING: Stable/uncomplicated  EVALUATION COMPLEXITY: Low   GOALS:  SHORT TERM GOALS: Target date: 09/18/23 Pt will be Ind in an initial HEP  Baseline: started Goal status: MET  2.  Increase L knee AROM to 5-100d to improve R knee function Baseline: 10-80d 09/07/23: significant quad lag Goal status: PARTIALLY MET (for flexion)   3.  Pt will be able to complete a SAQ and SLR independently Baseline: unable 09/07/23: unable 09/14/23: can complete with -20 d lag Goal status: ONGOING  LONG TERM GOALS: Target date: 10/30/23  Pt will be Ind in a final HEP to maintain achieved LOF Baseline: started Goal status: INITIAL  2.  Increase L knee AROM to 0-115d to for appropriate R knee function Baseline: 10-80d Goal status: INITIAL  3.  Increased R hip and knee strength to 4/5 or greater for appropriate R knee function Baseline:  Goal status: INITIAL  4.  Pt will be able to walk 864ft Indly and asc/dsc 12 steps c a HR and mod Ind for community mobility  Baseline:  Goal status: INITIAL  5.  Improve 5xSTS by MCID of 5" and by MCID of 61ft as indication of improved functional mobility  Baseline: Complete and 5xSTS when pt is no longer needing an assist device with walking Goal status: INITIAL  6.  Pt's FOTO score will improved to the predicted  value of 51% as indication of improved function  Baseline: 39% Goal status: INITIAL   PLAN:  PT FREQUENCY: 2x/week  PT DURATION: 8 weeks  PLANNED INTERVENTIONS: Therapeutic exercises, Therapeutic activity, Neuromuscular re-education, Balance training, Gait  training, Patient/Family education, Self Care, Joint mobilization, Stair training, Aquatic Therapy, Dry Needling, Electrical stimulation, Cryotherapy, Moist heat, Taping, Vasopneumatic device, Ultrasound, Ionotophoresis 4mg /ml Dexamethasone, Manual therapy, and Re-evaluation  PLAN FOR NEXT SESSION: Review FOTO; assess response to HEP; progress therex as indicated; use of modalities, manual therapy; and TPDN as indicated.    Ruddy Swire MS, PT 09/14/23 9:56 PM   [employer] Authorization   Choose one: Rehabilitative  Standardized Assessment or Functional Outcome Tool: See Pain Assessment and Other FOTO  Score or Percent Disability: 39  Body Parts Treated (Select each separately):  Knee. Overall deficits/functional limitations for body part selected: moderate   If treatment provided at initial evaluation, no treatment charged due to lack of authorization.

## 2023-09-14 NOTE — Therapy (Signed)
OUTPATIENT PHYSICAL THERAPY LOWER EXTREMITY TREATMENT   Patient Name: Sandra Lozano MRN: [id number] DOB:1960-12-18, 62 y.o., female Today's Date: 09/14/2023  END OF SESSION:  PT End of Session - 09/14/23 0851     Visit Number 5    Number of Visits 17    Date for PT Re-Evaluation 10/30/23    Authorization Type [place] MEDICAID [facility]    Authorization Time Period 08/31/23-10/30/23    Authorization - Visit Number 4    Authorization - Number of Visits 12    PT Start Time 0848    PT Stop Time 0930    PT Time Calculation (min) 42 min             Past Medical History:  Diagnosis Date   Arthritis    right knee, lower back   Chest wall pain 04/20/2021   Dyspnea    very rare -tx with albuterol neb sol if needed   Hyperlipidemia    Hypertension    Intertrigo 08/06/2022   Pre-diabetes    per patient - does not check blood sugar   Right hip pain 05/05/2018   Rupture of anterior cruciate ligament of right knee 02/15/2018   Sprain of medial collateral ligament of right knee 02/15/2018   SVD (spontaneous vaginal delivery)    x 5 - only 2 living, 1 stillborn and 1 premature at 7 months demise,1 child deceased   Wears dentures    upper and lower   Past Surgical History:  Procedure Laterality Date   FOOT ARTHRODESIS Left 11/02/2020   Procedure: LEFT MIDFOOT FUSION BASE 1ST AND 2ND METATARSAL;  Surgeon: Nadara Mustard, MD;  Location: [facility] OR;  Service: Orthopedics;  Laterality: Left;   LUMBAR FUSION  05/20/2019   GILL PROCEDURE, LEFT TRANSFORAMINAL LUMBAR INTERBODY FUSION, PEDICLE INSTRUMENTATION, BILATERAL FUSION (N/A   TOTAL KNEE ARTHROPLASTY Right 08/21/2023   Procedure: RIGHT TOTAL KNEE ARTHROPLASTY;  Surgeon: Nadara Mustard, MD;  Location: [facility] OR;  Service: Orthopedics;  Laterality: Right;   TUBAL LIGATION     Patient Active Problem List   Diagnosis Date Noted   Unilateral primary osteoarthritis, right knee 08/21/2023   Total knee replacement status, right 08/21/2023    Hidradenitis suppurativa of right axilla 06/03/2023   Vaginal irritation 02/21/2023   Skin lesion of back 02/21/2023   Tinea 12/18/2022   Left shoulder pain 08/25/2022   DJD (degenerative joint disease), multiple sites 08/06/2022   COPD, mild (HCC) 02/11/2022   Carpal tunnel syndrome, bilateral 12/21/2021   Arthritis of finger of left hand 11/26/2021   Traumatic arthritis of left foot    Lichen simplex chronicus 06/04/2020   Arthritis of midfoot 03/14/2020   History of lumbar fusion 03/14/2020   Well controlled diabetes mellitus (HCC) 11/07/2019   Nummular eczema 07/06/2019   Lumbar stenosis 05/20/2019   Acute left-sided low back pain without sciatica 07/18/2018   Osteoarthritis of right hip 06/03/2018   Chronic right shoulder pain 05/05/2018   Essential hypertension 02/15/2018    PCP: Lockie Mola, MD   REFERRING PROVIDER: Nadara Mustard, MD   REFERRING DIAG:  R53.1 (ICD-10-CM) - Weakness  Z96.659 (ICD-10-CM) - S/P knee replacement    THERAPY DIAG:  Chronic pain of right knee  Muscle weakness (generalized)  Rationale for Evaluation and Treatment: Rehabilitation  ONSET DATE: 08/21/23 surgery  SUBJECTIVE:   SUBJECTIVE STATEMENT: Ankle pain 3/10. Knee pain  2-3/10. Blood work shows that I do not have Gout. I fell this morning.  PERTINENT HISTORY: HTN  PAIN:  Are you having pain? Yes: NPRS scale: 2-3/10 Pain location: R knee Pain description: ache, discomfort Aggravating factors: Moving, walking Relieving factors: Ice man, tylenol  PRECAUTIONS: Knee  RED FLAGS: None   WEIGHT BEARING RESTRICTIONS: No WBAT  FALLS:  Has patient fallen in last 6 months? No  LIVING ENVIRONMENT: Lives with: lives with their family Lives in: House/apartment Stairs: Yes: External: 14 steps; can reach both   OCCUPATION: Not working  PLOF: Independent with household mobility with device and Independent with community mobility with device  PATIENT GOALS: Good use  of my R knee  NEXT MD VISIT: 09/08/23  OBJECTIVE:   PATIENT SURVEYS:  FOTO: Perceived function   39%, predicted   51%    COGNITION: Overall cognitive status: Within functional limits for tasks assessed     SENSATION: WFL  EDEMA:  Sweeling of the r knee  MUSCLE LENGTH: Hamstrings: Right NT deg; Left NT deg Thomas test: Right NT deg; Left NT deg  POSTURE: No Significant postural limitations  PALPATION: TTP of the peri-R knee and thigh  LOWER EXTREMITY ROM:  Active assisted ROM Right eval Left eval Right  Right 09/07/23 Right  09/14/23  Hip flexion       Hip extension       Hip abduction       Hip adduction       Hip internal rotation       Hip external rotation       Knee flexion 80  98 AA 107 A 108 AA  Knee extension 10 lacking    -20 quad lag  Ankle dorsiflexion       Ankle plantarflexion       Ankle inversion       Ankle eversion        (Blank rows = not tested)  LOWER EXTREMITY MMT:  MMT Right eval Left eval  Hip flexion 2   Hip extension    Hip abduction 2   Hip adduction    Hip internal rotation 2   Hip external rotation 2   Knee flexion 2   Knee extension 2   Ankle dorsiflexion    Ankle plantarflexion    Ankle inversion    Ankle eversion     (Blank rows = not tested)  LOWER EXTREMITY SPECIAL TESTS:  NT  FUNCTIONAL TESTS:  Complete and 5xSTS when pt is no longer needing an assist device with walking  GAIT: Distance walked: 168ft Assistive device utilized: Environmental consultant - 2 wheeled Level of assistance: Complete Independence Comments: Step to pattern c L LE   TODAY'S TREATMENT:    [facility] Adult PT Treatment:                                                DATE: 09/14/23 Therapeutic Exercise: Nustep LE only x 5 min Heel raises Retro stepping weight shift for quad Mini squats at RW x 10 Seated heel slide to [employer] set 3 sec x 10 Standing TKE green band x12 for 5 sec Seated LAQ x 15- quad deficit improved, still present SLR  x 10 with initial quad set, quad lag -20 Heel slides with strap  Supine ball squeeze with QS 3 sec x 15   [facility] Adult PT Treatment:  DATE: 09/09/23 Therapeutic Exercise: Nustep  LE only x 5 minutes  Heel raises TKE ball on wall Retro stepping weight shift for quad  LAQ 5 sec x 15 - quad deficit Mini squats at RW- focusing on weight to RLE Bilat QS x 15 Bridge x 10 focusing on weight to RLE Heel slides AA Hook lying ball squeeze  SAQ 5 sec x 15   Modalities: Ice pack left knee x 10 minutes     [facility] Adult PT Treatment:                                                DATE: 09/07/23 Therapeutic Exercise: Rec Bike no resist forward x 5 min Seated LAQ - quad deficit noted  Seated LAQ with strap assist on concentric  SAQ needs AA  Bridge x 12 Hooklying ball squeeze x12 Hooklying green band clam  Heel slides AROM QS into towel 5 sec - difficulty sustaining contraction  Mini squats at [employer] LE only for quad strengthening  Heel raises standing  Modalities: Ice pack right knee x 10 minutes     [facility] Adult PT Treatment:                                                DATE: 09/03/23 Therapeutic Exercise: Nustep L1 UE/LE x 4 min for ROM Rec Bike no resist- partial to full revolutions  x 2 minutes QS 5 sec x 10 into towel  SAQ AA 2-3 sec x 10 Heel slides AAROM STS working on foot placement.   Ice pack right knee x 10 minutes    [facility] Adult PT Treatment:                                                DATE: 08/28/23 Therapeutic Exercise: Developed, instructed in, and pt completed therex as noted in HEP Self Care: Proper technique to asc/dsc steps                                                                                                                             PATIENT EDUCATION:  Education details: Eval findings, POC, HEP, self care  Person educated: Patient Education method: Explanation, Demonstration, Tactile cues,  Verbal cues, and Handouts Education comprehension: verbalized understanding, returned demonstration, verbal cues required, tactile cues required, and needs further education  HOME EXERCISE PROGRAM: Access Code: [email or web address] URL: [email or web address] Date: 08/28/2023 Prepared by: Joellyn Rued  Exercises - Supine Quad Set  - 3 x daily - 7 x weekly - 1 sets - 10 reps -  5 hold - Supine Heel Slide with Strap  - 3 x daily - 7 x weekly - 1 sets - 10 reps - 5 hold - Supine Knee Extension Strengthening  - 3 x daily - 7 x weekly - 1 sets - 10 reps - 3 hold - Sit to Stand with Counter Support  - 3 x daily - 7 x weekly - 1 sets - 5 reps  ASSESSMENT:  CLINICAL IMPRESSION: Less ankle pain with ambulation today. Pt reports ankle pain could have been due to the rainy weather last week. She did fall this morning when she was carrying her laundry and using a SPC indoor, denies injury was able to get up using the couch for assist. She continues to demo significant quad lag, although it is improved compared to last week, measuring at -20 degrees today. She understands that she should continue to use RW full time due to quad weakness.  Pt will continue to benefit from skilled PT to address impairments to optimize function with less pain.    EVAL: Patient is a 62 y.o. female who was seen today for physical therapy evaluation and treatment for  R53.1 (ICD-10-CM) - Weakness  Z96.659 (ICD-10-CM) - S/P knee replacement  .Pt presents to PT walking c a RW, WBAT R LE, mod Ind. For 1 week s/p R TKR surgery, the pt's demonstrates good AAROM. Strength is limited with pt not being able to complete a SAQ or SLR. A HEP was initiated to address R knee/LE ROM and strength with pt returning demonstration. Pt will benefit from skilled PT 2w8 to address impairments to optimize function with less pain.   OBJECTIVE IMPAIRMENTS: decreased activity tolerance, decreased mobility, difficulty walking, decreased ROM, decreased  strength, increased edema, and pain.   ACTIVITY LIMITATIONS: carrying, lifting, bending, sitting, standing, squatting, sleeping, stairs, transfers, bed mobility, bathing, toileting, dressing, self feeding, hygiene/grooming, locomotion level, and caring for others  PARTICIPATION LIMITATIONS: meal prep, cleaning, laundry, driving, shopping, and community activity  PERSONAL FACTORS: Past/current experiences and Time since onset of injury/illness/exacerbation are also affecting patient's functional outcome.   REHAB POTENTIAL: Excellent  CLINICAL DECISION MAKING: Stable/uncomplicated  EVALUATION COMPLEXITY: Low   GOALS:  SHORT TERM GOALS: Target date: 09/18/23 Pt will be Ind in an initial HEP  Baseline: started Goal status: MET  2.  Increase L knee AROM to 5-100d to improve R knee function Baseline: 10-80d 09/07/23: significant quad lag Goal status: PARTIALLY MET (for flexion)   3.  Pt will be able to complete a SAQ and SLR independently Baseline: unable 09/07/23: unable 09/14/23: can complete with -20 d lag Goal status: ONGOING  LONG TERM GOALS: Target date: 10/30/23  Pt will be Ind in a final HEP to maintain achieved LOF Baseline: started Goal status: INITIAL  2.  Increase L knee AROM to 0-115d to for appropriate R knee function Baseline: 10-80d Goal status: INITIAL  3.  Increased R hip and knee strength to 4/5 or greater for appropriate R knee function Baseline:  Goal status: INITIAL  4.  Pt will be able to walk 839ft Indly and asc/dsc 12 steps c a HR and mod Ind for community mobility  Baseline:  Goal status: INITIAL  5.  Improve 5xSTS by MCID of 5" and by MCID of 37ft as indication of improved functional mobility  Baseline: Complete and 5xSTS when pt is no longer needing an assist device with walking Goal status: INITIAL  6.  Pt's FOTO score will improved to the predicted  value of 51% as indication of improved function  Baseline: 39% Goal status:  INITIAL   PLAN:  PT FREQUENCY: 2x/week  PT DURATION: 8 weeks  PLANNED INTERVENTIONS: Therapeutic exercises, Therapeutic activity, Neuromuscular re-education, Balance training, Gait training, Patient/Family education, Self Care, Joint mobilization, Stair training, Aquatic Therapy, Dry Needling, Electrical stimulation, Cryotherapy, Moist heat, Taping, Vasopneumatic device, Ultrasound, Ionotophoresis 4mg /ml Dexamethasone, Manual therapy, and Re-evaluation  PLAN FOR NEXT SESSION: Review FOTO; assess response to HEP; progress therex as indicated; use of modalities, manual therapy; and TPDN as indicated.    Jannette Spanner, PTA 09/14/23 9:46 AM Phone: [phone] Fax: [phone]   [facility] Authorization   Choose one: Rehabilitative  Standardized Assessment or Functional Outcome Tool: See Pain Assessment and Other FOTO  Score or Percent Disability: 39  Body Parts Treated (Select each separately):  Knee. Overall deficits/functional limitations for body part selected: moderate   If treatment provided at initial evaluation, no treatment charged due to lack of authorization.

## 2023-09-15 ENCOUNTER — Other Ambulatory Visit: Payer: Self-pay | Admitting: Family Medicine

## 2023-09-15 DIAGNOSIS — Z419 Encounter for procedure for purposes other than remedying health state, unspecified: Secondary | ICD-10-CM | POA: Diagnosis not present

## 2023-09-15 DIAGNOSIS — E119 Type 2 diabetes mellitus without complications: Secondary | ICD-10-CM

## 2023-09-16 ENCOUNTER — Ambulatory Visit: Payer: Medicaid Other | Attending: Orthopedic Surgery

## 2023-09-16 ENCOUNTER — Encounter: Payer: Medicaid Other | Admitting: Orthopedic Surgery

## 2023-09-16 DIAGNOSIS — M25561 Pain in right knee: Secondary | ICD-10-CM | POA: Diagnosis not present

## 2023-09-16 DIAGNOSIS — M6281 Muscle weakness (generalized): Secondary | ICD-10-CM | POA: Diagnosis not present

## 2023-09-16 DIAGNOSIS — G8929 Other chronic pain: Secondary | ICD-10-CM | POA: Diagnosis not present

## 2023-09-16 DIAGNOSIS — R262 Difficulty in walking, not elsewhere classified: Secondary | ICD-10-CM | POA: Insufficient documentation

## 2023-09-21 ENCOUNTER — Ambulatory Visit: Payer: Medicaid Other | Admitting: Physical Therapy

## 2023-09-21 ENCOUNTER — Encounter: Payer: Self-pay | Admitting: Physical Therapy

## 2023-09-21 DIAGNOSIS — G8929 Other chronic pain: Secondary | ICD-10-CM | POA: Diagnosis not present

## 2023-09-21 DIAGNOSIS — M6281 Muscle weakness (generalized): Secondary | ICD-10-CM | POA: Diagnosis not present

## 2023-09-21 DIAGNOSIS — R262 Difficulty in walking, not elsewhere classified: Secondary | ICD-10-CM | POA: Diagnosis not present

## 2023-09-21 DIAGNOSIS — M25561 Pain in right knee: Secondary | ICD-10-CM | POA: Diagnosis not present

## 2023-09-21 NOTE — Therapy (Signed)
OUTPATIENT PHYSICAL THERAPY LOWER EXTREMITY TREATMENT   Patient Name: Sandra Lozano MRN: [id number] DOB:19-Feb-1961, 62 y.o., female Today's Date: 09/21/2023  END OF SESSION:  PT End of Session - 09/21/23 0900     Visit Number 7    Number of Visits 17    Date for PT Re-Evaluation 10/30/23    Authorization Type [place] MEDICAID [facility]    Authorization Time Period 08/31/23-10/30/23    Authorization - Visit Number 6    Authorization - Number of Visits 12    PT Start Time 0850    PT Stop Time 0940    PT Time Calculation (min) 50 min              Past Medical History:  Diagnosis Date   Arthritis    right knee, lower back   Chest wall pain 04/20/2021   Dyspnea    very rare -tx with albuterol neb sol if needed   Hyperlipidemia    Hypertension    Intertrigo 08/06/2022   Pre-diabetes    per patient - does not check blood sugar   Right hip pain 05/05/2018   Rupture of anterior cruciate ligament of right knee 02/15/2018   Sprain of medial collateral ligament of right knee 02/15/2018   SVD (spontaneous vaginal delivery)    x 5 - only 2 living, 1 stillborn and 1 premature at 7 months demise,1 child deceased   Wears dentures    upper and lower   Past Surgical History:  Procedure Laterality Date   FOOT ARTHRODESIS Left 11/02/2020   Procedure: LEFT MIDFOOT FUSION BASE 1ST AND 2ND METATARSAL;  Surgeon: Nadara Mustard, MD;  Location: [facility] OR;  Service: Orthopedics;  Laterality: Left;   LUMBAR FUSION  05/20/2019   GILL PROCEDURE, LEFT TRANSFORAMINAL LUMBAR INTERBODY FUSION, PEDICLE INSTRUMENTATION, BILATERAL FUSION (N/A   TOTAL KNEE ARTHROPLASTY Right 08/21/2023   Procedure: RIGHT TOTAL KNEE ARTHROPLASTY;  Surgeon: Nadara Mustard, MD;  Location: [facility] OR;  Service: Orthopedics;  Laterality: Right;   TUBAL LIGATION     Patient Active Problem List   Diagnosis Date Noted   Unilateral primary osteoarthritis, right knee 08/21/2023   Total knee replacement status, right 08/21/2023    Hidradenitis suppurativa of right axilla 06/03/2023   Vaginal irritation 02/21/2023   Skin lesion of back 02/21/2023   Tinea 12/18/2022   Left shoulder pain 08/25/2022   DJD (degenerative joint disease), multiple sites 08/06/2022   COPD, mild (HCC) 02/11/2022   Carpal tunnel syndrome, bilateral 12/21/2021   Arthritis of finger of left hand 11/26/2021   Traumatic arthritis of left foot    Lichen simplex chronicus 06/04/2020   Arthritis of midfoot 03/14/2020   History of lumbar fusion 03/14/2020   Well controlled diabetes mellitus (HCC) 11/07/2019   Nummular eczema 07/06/2019   Lumbar stenosis 05/20/2019   Acute left-sided low back pain without sciatica 07/18/2018   Osteoarthritis of right hip 06/03/2018   Chronic right shoulder pain 05/05/2018   Essential hypertension 02/15/2018    PCP: Lockie Mola, MD   REFERRING PROVIDER: Nadara Mustard, MD   REFERRING DIAG:  R53.1 (ICD-10-CM) - Weakness  Z96.659 (ICD-10-CM) - S/P knee replacement    THERAPY DIAG:  Chronic pain of right knee  Muscle weakness (generalized)  Rationale for Evaluation and Treatment: Rehabilitation  ONSET DATE: 08/21/23 surgery  SUBJECTIVE:   SUBJECTIVE STATEMENT: I started using the cane yesterday. No falls.  I have stiffness.   PERTINENT HISTORY: HTN  PAIN:  Are you having pain?  Yes: NPRS scale: 0/10 Pain location: R knee Pain description: ache, discomfort Aggravating factors: Moving, walking Relieving factors: Ice man, tylenol  PRECAUTIONS: Knee  RED FLAGS: None   WEIGHT BEARING RESTRICTIONS: No WBAT  FALLS:  Has patient fallen in last 6 months? No  LIVING ENVIRONMENT: Lives with: lives with their family Lives in: House/apartment Stairs: Yes: External: 14 steps; can reach both   OCCUPATION: Not working  PLOF: Independent with household mobility with device and Independent with community mobility with device  PATIENT GOALS: Good use of my R knee  NEXT MD VISIT:  09/08/23  OBJECTIVE:   PATIENT SURVEYS:  FOTO: Perceived function   39%, predicted   51%    COGNITION: Overall cognitive status: Within functional limits for tasks assessed     SENSATION: WFL  EDEMA:  Sweeling of the r knee  MUSCLE LENGTH: Hamstrings: Right NT deg; Left NT deg Thomas test: Right NT deg; Left NT deg  POSTURE: No Significant postural limitations  PALPATION: TTP of the peri-R knee and thigh  LOWER EXTREMITY ROM:  Active assisted ROM Right eval Left eval Right  Right 09/07/23 Right  09/14/23 Right 09/21/23  Hip flexion        Hip extension        Hip abduction        Hip adduction        Hip internal rotation        Hip external rotation        Knee flexion 80  98 AA 107 A 108 AA   Knee extension 10 lacking    -20 quad lag -20  Ankle dorsiflexion        Ankle plantarflexion        Ankle inversion        Ankle eversion         (Blank rows = not tested)  LOWER EXTREMITY MMT:  MMT Right eval Left eval  Hip flexion 2   Hip extension    Hip abduction 2   Hip adduction    Hip internal rotation 2   Hip external rotation 2   Knee flexion 2   Knee extension 2   Ankle dorsiflexion    Ankle plantarflexion    Ankle inversion    Ankle eversion     (Blank rows = not tested)  LOWER EXTREMITY SPECIAL TESTS:  NT  FUNCTIONAL TESTS:  Complete and 5xSTS when pt is no longer needing an assist device with walking  GAIT: Distance walked: 153ft Assistive device utilized: Environmental consultant - 2 wheeled Level of assistance: Complete Independence Comments: Step to pattern c L LE   TODAY'S TREATMENT:  [facility] Adult PT Treatment:                                                DATE: 09/21/23 Therapeutic Exercise: Rec Bike L1 X 5 minutes  LAQ 5 sec x 10 LAQ with yellow band 5 sec  SLR -20 degree quad lag QS long sitting for visual feed back  SAQ 2# 5 sec x 12  SAQ with ball squeeze  Bridge with ball squeeze  Side hip abduction x 10 Prone h/s curl x 10 Prone  hip ext  x 10  Therapeutic Activity: Gait With SPC - mod cues  Modalities: Ice pack right knee x 10 min Self Care: Discussed Quad lag  and high risk of knee buckling- pt used clinic RW to exit clinic , husband outside waiting.   [facility] Adult PT Treatment:                                                DATE: 09/16/23 Therapeutic Exercise: Nustep UE/LE x 5 min L 4 adjusting seating progressively to more flexion TKE ball on wall x15 Wall slides 60d x15 TKE c GTB x20 Standing hip ext 2x10 eack LE Standing hip abd 2x10 each LE; R knee gave out on last rep of L hip abd Therapeutic Activity: Gait training in // bars using L hand on 1 rail to mimic cnae use Modalities: Cold pack to R knee x10 mins    [facility] Adult PT Treatment:                                                DATE: 09/14/23 Therapeutic Exercise: Nustep LE only x 5 min  Heel raises Retro stepping weight shift for quad Mini squats at RW x 10 Seated heel slide to [employer] set 3 sec x 10 Standing TKE green band x12 for 5 sec Seated LAQ x 15- quad deficit improved, still present SLR x 10 with initial quad set, quad lag -20 Heel slides with strap  Supine ball squeeze with QS 3 sec x 15   [facility] Adult PT Treatment:                                                DATE: 09/09/23 Therapeutic Exercise: Nustep  LE only x 5 minutes  Heel raises TKE ball on wall Retro stepping weight shift for quad  LAQ 5 sec x 15 - quad deficit Mini squats at [employer]- focusing on weight to RLE Bilat QS x 15 Bridge x 10 focusing on weight to RLE Heel slides AA Hook lying ball squeeze  SAQ 5 sec x 15   Modalities: Ice pack left knee x 10 minutes                                   PATIENT EDUCATION:  Education details: Eval findings, POC, HEP, self care  Person educated: Patient Education method: Explanation, Demonstration, Tactile cues, Verbal cues, and Handouts Education comprehension: verbalized understanding, returned demonstration,  verbal cues required, tactile cues required, and needs further education  HOME EXERCISE PROGRAM: Access Code: [email or web address] URL: [email or web address] Date: 08/28/2023 Prepared by: Joellyn Rued  Exercises - Supine Quad Set  - 3 x daily - 7 x weekly - 1 sets - 10 reps - 5 hold - Supine Heel Slide with Strap  - 3 x daily - 7 x weekly - 1 sets - 10 reps - 5 hold - Supine Knee Extension Strengthening  - 3 x daily - 7 x weekly - 1 sets - 10 reps - 3 hold - Sit to Stand with Counter Support  - 3 x daily - 7 x weekly - 1 sets - 5 reps  ASSESSMENT:  CLINICAL IMPRESSION:  Pt arrives with SPC. Gait training cues provided for proper step through pattern. Pt able to return demonstrates. Pt continues to demonstrate 20 degree quad lag and was again educated on her high risk of knee buckle and fall. Continued with quad activation using visual feedback and tactile feedback to assist. Initiated open chain strengthening for hip abduction and extension which she fatigued quickly.  Pt used clinic RW to exit building for safety. Pt advised to bring RW to PT visits until therapist recommends otherwise. Pt verbalized understanding.  Pt will continue to benefit from skilled PT to address impairments to optimize function with less pain.   EVAL: Patient is a 62 y.o. female who was seen today for physical therapy evaluation and treatment for  R53.1 (ICD-10-CM) - Weakness  Z96.659 (ICD-10-CM) - S/P knee replacement  .Pt presents to PT walking c a RW, WBAT R LE, mod Ind. For 1 week s/p R TKR surgery, the pt's demonstrates good AAROM. Strength is limited with pt not being able to complete a SAQ or SLR. A HEP was initiated to address R knee/LE ROM and strength with pt returning demonstration. Pt will benefit from skilled PT 2w8 to address impairments to optimize function with less pain.   OBJECTIVE IMPAIRMENTS: decreased activity tolerance, decreased mobility, difficulty walking, decreased ROM, decreased strength,  increased edema, and pain.   ACTIVITY LIMITATIONS: carrying, lifting, bending, sitting, standing, squatting, sleeping, stairs, transfers, bed mobility, bathing, toileting, dressing, self feeding, hygiene/grooming, locomotion level, and caring for others  PARTICIPATION LIMITATIONS: meal prep, cleaning, laundry, driving, shopping, and community activity  PERSONAL FACTORS: Past/current experiences and Time since onset of injury/illness/exacerbation are also affecting patient's functional outcome.   REHAB POTENTIAL: Excellent  CLINICAL DECISION MAKING: Stable/uncomplicated  EVALUATION COMPLEXITY: Low   GOALS:  SHORT TERM GOALS: Target date: 09/18/23 Pt will be Ind in an initial HEP  Baseline: started Goal status: MET  2.  Increase L knee AROM to 5-100d to improve R knee function Baseline: 10-80d 09/07/23: significant quad lag Goal status: PARTIALLY MET (for flexion)   3.  Pt will be able to complete a SAQ and SLR independently Baseline: unable 09/07/23: unable 09/14/23: can complete with -20 d lag Goal status: ONGOING  LONG TERM GOALS: Target date: 10/30/23  Pt will be Ind in a final HEP to maintain achieved LOF Baseline: started Goal status: INITIAL  2.  Increase L knee AROM to 0-115d to for appropriate R knee function Baseline: 10-80d Goal status: INITIAL  3.  Increased R hip and knee strength to 4/5 or greater for appropriate R knee function Baseline:  Goal status: INITIAL  4.  Pt will be able to walk 839ft Indly and asc/dsc 12 steps c a HR and mod Ind for community mobility  Baseline:  Goal status: INITIAL  5.  Improve 5xSTS by MCID of 5" and by MCID of 65ft as indication of improved functional mobility  Baseline: Complete and 5xSTS when pt is no longer needing an assist device with walking Goal status: INITIAL  6.  Pt's FOTO score will improved to the predicted value of 51% as indication of improved function  Baseline: 39% Goal status:  INITIAL   PLAN:  PT FREQUENCY: 2x/week  PT DURATION: 8 weeks  PLANNED INTERVENTIONS: Therapeutic exercises, Therapeutic activity, Neuromuscular re-education, Balance training, Gait training, Patient/Family education, Self Care, Joint mobilization, Stair training, Aquatic Therapy, Dry Needling, Electrical stimulation, Cryotherapy, Moist heat, Taping, Vasopneumatic device, Ultrasound, Ionotophoresis 4mg /ml Dexamethasone, Manual therapy, and Re-evaluation  PLAN FOR  NEXT SESSION: FOTO status; assess response to HEP; progress therex as indicated; use of modalities, manual therapy; and TPDN as indicated. STRENGTHEN HIP    Jannette Spanner, [place] 09/21/23 10:27 AM Phone: [phone] Fax: [phone]    [facility] Authorization   Choose one: Rehabilitative  Standardized Assessment or Functional Outcome Tool: See Pain Assessment and Other FOTO  Score or Percent Disability: 39  Body Parts Treated (Select each separately):  Knee. Overall deficits/functional limitations for body part selected: moderate   If treatment provided at initial evaluation, no treatment charged due to lack of authorization.

## 2023-09-22 ENCOUNTER — Ambulatory Visit (INDEPENDENT_AMBULATORY_CARE_PROVIDER_SITE_OTHER): Payer: Medicaid Other | Admitting: Orthopedic Surgery

## 2023-09-22 ENCOUNTER — Encounter: Payer: Self-pay | Admitting: Orthopedic Surgery

## 2023-09-22 DIAGNOSIS — M25571 Pain in right ankle and joints of right foot: Secondary | ICD-10-CM | POA: Diagnosis not present

## 2023-09-22 DIAGNOSIS — M1711 Unilateral primary osteoarthritis, right knee: Secondary | ICD-10-CM

## 2023-09-22 NOTE — Progress Notes (Signed)
Office Visit Note   Patient: Sandra Lozano           Date of Birth: 09/01/1961           MRN: [id number] Visit Date: 09/22/2023              Requested by: Lockie Mola, MD [address] [place],  [place] [address] PCP: Lockie Mola, MD  Chief Complaint  Patient presents with   Right Knee - Routine Post Op    08/21/23 right TKA   Right Ankle - Follow-up      HPI: Patient is a 62 year old woman who is 4 weeks status post right total knee arthroplasty.  Patient has persistent pain over the peroneal tendons right ankle.  Assessment & Plan: Visit Diagnoses:  1. Primary osteoarthritis of right knee   2. Pain in right ankle and joints of right foot     Plan: Will provide her an ASO to help with the peroneal tendinitis.  She will also try Voltaren gel continue with her therapy.  Follow-Up Instructions: Return in about 3 weeks (around 10/13/2023).   Ortho Exam  Patient is alert, oriented, no adenopathy, well-dressed, normal affect, normal respiratory effort. Examination patient has full extension of her knee no flexion contracture.  She still has swelling there is no keloiding of the scar recommended scar massage.  Patient has pain over the peroneal tendons of the right ankle and we will place her in an ASO to support the ankle.  She will continue to work on knee extension and flexion exercises.  Imaging: No results found. No images are attached to the encounter.  Labs: Lab Results  Component Value Date   HGBA1C 5.5 07/01/2023   HGBA1C 5.7 12/18/2022   HGBA1C 6.4 08/19/2022   ESRSEDRATE 11 11/26/2021   LABURIC 5.6 09/08/2023   LABURIC 5.2 11/26/2021     Lab Results  Component Value Date   ALBUMIN 4.0 05/18/2019    No results found for: "MG" No results found for: "VD25OH"  No results found for: "PREALBUMIN"    Latest Ref Rng & Units 08/13/2023    9:10 AM 07/01/2023    9:06 AM 11/02/2020    8:05 AM  CBC EXTENDED  WBC 4.0 - 10.5 K/uL 6.0  6.1    RBC 3.87 -  5.11 MIL/uL 4.57  4.38    Hemoglobin 12.0 - 15.0 g/dL 32.9  51.8  84.1   HCT 36.0 - 46.0 % 40.7  38.5  43.0   Platelets 150 - 400 K/uL 272  296       There is no height or weight on file to calculate BMI.  Orders:  No orders of the defined types were placed in this encounter.  No orders of the defined types were placed in this encounter.    Procedures: No procedures performed  Clinical Data: No additional findings.  ROS:  All other systems negative, except as noted in the HPI. Review of Systems  Objective: Vital Signs: LMP  (LMP Unknown)   Specialty Comments:  EXAM: MRI LUMBAR SPINE WITHOUT CONTRAST   TECHNIQUE: Multiplanar, multisequence MR imaging of the lumbar spine was performed. No intravenous contrast was administered.   COMPARISON:  Lumbar radiographs 09/28/2018   FINDINGS: Segmentation:  Normal   Alignment:  7 mm anterolisthesis L4-5.  Remaining alignment normal   Vertebrae: Negative for fracture or mass. Bone marrow edema at L4-5 related to discogenic change.   Conus medullaris and cauda equina: Conus extends to the  L1-2 level. Conus and cauda equina appear normal.   Paraspinal and other soft tissues: Negative for paraspinous mass or fluid collection   Disc levels:   L1-2: Negative   L2-3: Negative   L3-4: Mild facet degeneration. Negative for disc protrusion or stenosis   L4-5: 7 mm anterolisthesis. Severe facet degeneration bilaterally. Severe disc degeneration with endplate erosive change and edema. Severe spinal stenosis. Severe subarticular foraminal stenosis left greater than right   L5-S1: Negative   IMPRESSION: Severe spinal stenosis L4-5 with severe subarticular foraminal stenosis bilaterally left greater than right. Grade 1 anterolisthesis L4-5. Remaining lumbar spine negative     Electronically Signed   By: Marlan Palau M.D.   On: 11/13/2018 15:34  PMFS History: Patient Active Problem List   Diagnosis Date Noted    Unilateral primary osteoarthritis, right knee 08/21/2023   Total knee replacement status, right 08/21/2023   Hidradenitis suppurativa of right axilla 06/03/2023   Vaginal irritation 02/21/2023   Skin lesion of back 02/21/2023   Tinea 12/18/2022   Left shoulder pain 08/25/2022   DJD (degenerative joint disease), multiple sites 08/06/2022   COPD, mild (HCC) 02/11/2022   Carpal tunnel syndrome, bilateral 12/21/2021   Arthritis of finger of left hand 11/26/2021   Traumatic arthritis of left foot    Lichen simplex chronicus 06/04/2020   Arthritis of midfoot 03/14/2020   History of lumbar fusion 03/14/2020   Well controlled diabetes mellitus (HCC) 11/07/2019   Nummular eczema 07/06/2019   Lumbar stenosis 05/20/2019   Acute left-sided low back pain without sciatica 07/18/2018   Osteoarthritis of right hip 06/03/2018   Chronic right shoulder pain 05/05/2018   Essential hypertension 02/15/2018   Past Medical History:  Diagnosis Date   Arthritis    right knee, lower back   Chest wall pain 04/20/2021   Dyspnea    very rare -tx with albuterol neb sol if needed   Hyperlipidemia    Hypertension    Intertrigo 08/06/2022   Pre-diabetes    per patient - does not check blood sugar   Right hip pain 05/05/2018   Rupture of anterior cruciate ligament of right knee 02/15/2018   Sprain of medial collateral ligament of right knee 02/15/2018   SVD (spontaneous vaginal delivery)    x 5 - only 2 living, 1 stillborn and 1 premature at 7 months demise,1 child deceased   Wears dentures    upper and lower    Family History  Problem Relation Age of Onset   Cancer Sister    Healthy Daughter    Healthy Daughter     Past Surgical History:  Procedure Laterality Date   FOOT ARTHRODESIS Left 11/02/2020   Procedure: LEFT MIDFOOT FUSION BASE 1ST AND 2ND METATARSAL;  Surgeon: Nadara Mustard, MD;  Location: [facility] OR;  Service: Orthopedics;  Laterality: Left;   LUMBAR FUSION  05/20/2019   GILL PROCEDURE,  LEFT TRANSFORAMINAL LUMBAR INTERBODY FUSION, PEDICLE INSTRUMENTATION, BILATERAL FUSION (N/A   TOTAL KNEE ARTHROPLASTY Right 08/21/2023   Procedure: RIGHT TOTAL KNEE ARTHROPLASTY;  Surgeon: Nadara Mustard, MD;  Location: [facility] OR;  Service: Orthopedics;  Laterality: Right;   TUBAL LIGATION     Social History   Occupational History   Not on file  Tobacco Use   Smoking status: Former    Current packs/day: 0.00    Average packs/day: 1 pack/day for 30.0 years (30.0 ttl pk-yrs)    Types: Cigarettes    Start date: 01/1985    Quit date: 01/2015  Years since quitting: 8.6    Passive exposure: Past   Smokeless tobacco: Never  Vaping Use   Vaping status: Never Used  Substance and Sexual Activity   Alcohol use: Yes    Comment: occ   Drug use: Yes    Types: Marijuana   Sexual activity: Not on file

## 2023-09-23 ENCOUNTER — Other Ambulatory Visit: Payer: Self-pay | Admitting: Student

## 2023-09-23 DIAGNOSIS — G5601 Carpal tunnel syndrome, right upper limb: Secondary | ICD-10-CM

## 2023-09-24 ENCOUNTER — Encounter: Payer: Medicaid Other | Admitting: Orthopedic Surgery

## 2023-09-26 ENCOUNTER — Other Ambulatory Visit: Payer: Self-pay | Admitting: Family Medicine

## 2023-09-26 DIAGNOSIS — G5601 Carpal tunnel syndrome, right upper limb: Secondary | ICD-10-CM

## 2023-09-29 ENCOUNTER — Encounter: Payer: Self-pay | Admitting: Physical Therapy

## 2023-09-29 ENCOUNTER — Ambulatory Visit: Payer: Medicaid Other | Admitting: Physical Therapy

## 2023-09-29 DIAGNOSIS — M6281 Muscle weakness (generalized): Secondary | ICD-10-CM

## 2023-09-29 DIAGNOSIS — R262 Difficulty in walking, not elsewhere classified: Secondary | ICD-10-CM | POA: Diagnosis not present

## 2023-09-29 DIAGNOSIS — M25561 Pain in right knee: Secondary | ICD-10-CM | POA: Diagnosis not present

## 2023-09-29 DIAGNOSIS — G8929 Other chronic pain: Secondary | ICD-10-CM | POA: Diagnosis not present

## 2023-09-29 NOTE — Therapy (Signed)
OUTPATIENT PHYSICAL THERAPY LOWER EXTREMITY TREATMENT   Patient Name: Sandra Lozano MRN: [id number] DOB:11-22-1961, 62 y.o., female Today's Date: 09/29/2023  END OF SESSION:  PT End of Session - 09/29/23 0805     Visit Number 8    Number of Visits 17    Date for PT Re-Evaluation 10/30/23    Authorization Type [place] MEDICAID [facility]    Authorization Time Period 08/31/23-10/30/23    Authorization - Visit Number 7    Authorization - Number of Visits 12    PT Start Time 0800    PT Stop Time 0845    PT Time Calculation (min) 45 min              Past Medical History:  Diagnosis Date   Arthritis    right knee, lower back   Chest wall pain 04/20/2021   Dyspnea    very rare -tx with albuterol neb sol if needed   Hyperlipidemia    Hypertension    Intertrigo 08/06/2022   Pre-diabetes    per patient - does not check blood sugar   Right hip pain 05/05/2018   Rupture of anterior cruciate ligament of right knee 02/15/2018   Sprain of medial collateral ligament of right knee 02/15/2018   SVD (spontaneous vaginal delivery)    x 5 - only 2 living, 1 stillborn and 1 premature at 7 months demise,1 child deceased   Wears dentures    upper and lower   Past Surgical History:  Procedure Laterality Date   FOOT ARTHRODESIS Left 11/02/2020   Procedure: LEFT MIDFOOT FUSION BASE 1ST AND 2ND METATARSAL;  Surgeon: Nadara Mustard, MD;  Location: [facility] OR;  Service: Orthopedics;  Laterality: Left;   LUMBAR FUSION  05/20/2019   GILL PROCEDURE, LEFT TRANSFORAMINAL LUMBAR INTERBODY FUSION, PEDICLE INSTRUMENTATION, BILATERAL FUSION (N/A   TOTAL KNEE ARTHROPLASTY Right 08/21/2023   Procedure: RIGHT TOTAL KNEE ARTHROPLASTY;  Surgeon: Nadara Mustard, MD;  Location: [facility] OR;  Service: Orthopedics;  Laterality: Right;   TUBAL LIGATION     Patient Active Problem List   Diagnosis Date Noted   Unilateral primary osteoarthritis, right knee 08/21/2023   Total knee replacement status, right 08/21/2023    Hidradenitis suppurativa of right axilla 06/03/2023   Vaginal irritation 02/21/2023   Skin lesion of back 02/21/2023   Tinea 12/18/2022   Left shoulder pain 08/25/2022   DJD (degenerative joint disease), multiple sites 08/06/2022   COPD, mild (HCC) 02/11/2022   Carpal tunnel syndrome, bilateral 12/21/2021   Arthritis of finger of left hand 11/26/2021   Traumatic arthritis of left foot    Lichen simplex chronicus 06/04/2020   Arthritis of midfoot 03/14/2020   History of lumbar fusion 03/14/2020   Well controlled diabetes mellitus (HCC) 11/07/2019   Nummular eczema 07/06/2019   Lumbar stenosis 05/20/2019   Acute left-sided low back pain without sciatica 07/18/2018   Osteoarthritis of right hip 06/03/2018   Chronic right shoulder pain 05/05/2018   Essential hypertension 02/15/2018    PCP: Lockie Mola, MD   REFERRING PROVIDER: Nadara Mustard, MD   REFERRING DIAG:  R53.1 (ICD-10-CM) - Weakness  Z96.659 (ICD-10-CM) - S/P knee replacement    THERAPY DIAG:  Chronic pain of right knee  Muscle weakness (generalized)  Rationale for Evaluation and Treatment: Rehabilitation  ONSET DATE: 08/21/23 surgery  SUBJECTIVE:   SUBJECTIVE STATEMENT: I've been working on the exercises. I've been getting stiff. I only had one appointment last week.   PERTINENT HISTORY: HTN  PAIN:  Are you having pain? Yes: NPRS scale: 0/10 Pain location: R knee Pain description: ache, discomfort Aggravating factors: Moving, walking Relieving factors: Ice man, tylenol  PRECAUTIONS: Knee  RED FLAGS: None   WEIGHT BEARING RESTRICTIONS: No WBAT  FALLS:  Has patient fallen in last 6 months? No  LIVING ENVIRONMENT: Lives with: lives with their family Lives in: House/apartment Stairs: Yes: External: 14 steps; can reach both   OCCUPATION: Not working  PLOF: Independent with household mobility with device and Independent with community mobility with device  PATIENT GOALS: Good use of my R  knee  NEXT MD VISIT: 09/08/23  OBJECTIVE:   PATIENT SURVEYS:  FOTO: Perceived function   39%, predicted   51%    COGNITION: Overall cognitive status: Within functional limits for tasks assessed     SENSATION: WFL  EDEMA:  Sweeling of the r knee  MUSCLE LENGTH: Hamstrings: Right NT deg; Left NT deg Thomas test: Right NT deg; Left NT deg  POSTURE: No Significant postural limitations  PALPATION: TTP of the peri-R knee and thigh  LOWER EXTREMITY ROM:  Active assisted ROM Right eval Left eval Right  Right 09/07/23 Right  09/14/23 Right 09/21/23 Right 09/29/23  Hip flexion         Hip extension         Hip abduction         Hip adduction         Hip internal rotation         Hip external rotation                  Knee flexion 80  98 AA 107 A 108 AA  117  Knee extension 10 lacking    -20 quad lag -20 -20  Ankle dorsiflexion         Ankle plantarflexion         Ankle inversion         Ankle eversion          (Blank rows = not tested)  LOWER EXTREMITY MMT:  MMT Right eval Left eval  Hip flexion 2   Hip extension    Hip abduction 2   Hip adduction    Hip internal rotation 2   Hip external rotation 2   Knee flexion 2   Knee extension 2   Ankle dorsiflexion    Ankle plantarflexion    Ankle inversion    Ankle eversion     (Blank rows = not tested)  LOWER EXTREMITY SPECIAL TESTS:  NT  FUNCTIONAL TESTS:  Complete and 5xSTS when pt is no longer needing an assist device with walking  GAIT: Distance walked: 150ft Assistive device utilized: Environmental consultant - 2 wheeled Level of assistance: Complete Independence Comments: Step to pattern c L LE   TODAY'S TREATMENT:  [facility] Adult PT Treatment:                                                DATE: 09/29/23 Therapeutic Exercise: Nustep LE x 7 minutes  TKE blue band in doorway - pt instructed in set up for home H/S curl blue band in doorway- pt instructed in set up for Home  Mini squat to chair focusing on weight  shift to RLE  Seated LAQ isometric into ball at 90 degrees 5 sec 10 x 2  LAQ red band  10 x 2  R seated leg press 20# x 10 Updated HEP    [facility] Adult PT Treatment:                                                DATE: 09/21/23 Therapeutic Exercise: Rec Bike L1 X 5 minutes  LAQ 5 sec x 10 LAQ with yellow band 5 sec  SLR -20 degree quad lag QS long sitting for visual feed back  SAQ 2# 5 sec x 12  SAQ with ball squeeze  Bridge with ball squeeze  Side hip abduction x 10 Prone h/s curl x 10 Prone hip ext  x 10  Therapeutic Activity: Gait With SPC - mod cues  Modalities: Ice pack right knee x 10 min Self Care: Discussed Quad lag and high risk of knee buckling- pt used clinic RW to exit clinic , husband outside waiting.   [facility] Adult PT Treatment:                                                DATE: 09/16/23 Therapeutic Exercise: Nustep UE/LE x 5 min L 4 adjusting seating progressively to more flexion TKE ball on wall x15 Wall slides 60d x15 TKE c GTB x20 Standing hip ext 2x10 eack LE Standing hip abd 2x10 each LE; R knee gave out on last rep of L hip abd Therapeutic Activity: Gait training in // bars using L hand on 1 rail to mimic cnae use Modalities: Cold pack to R knee x10 mins    [facility] Adult PT Treatment:                                                DATE: 09/14/23 Therapeutic Exercise: Nustep LE only x 5 min  Heel raises Retro stepping weight shift for quad Mini squats at RW x 10 Seated heel slide to [employer] set 3 sec x 10 Standing TKE green band x12 for 5 sec Seated LAQ x 15- quad deficit improved, still present SLR x 10 with initial quad set, quad lag -20 Heel slides with strap  Supine ball squeeze with QS 3 sec x 15                                     PATIENT EDUCATION:  Education details: Eval findings, POC, HEP, self care  Person educated: Patient Education method: Explanation, Demonstration, Tactile cues, Verbal cues, and Handouts Education  comprehension: verbalized understanding, returned demonstration, verbal cues required, tactile cues required, and needs further education  HOME EXERCISE PROGRAM: Access Code: [email or web address] URL: [email or web address] Date: 08/28/2023 Prepared by: Joellyn Rued  Exercises - Supine Quad Set  - 3 x daily - 7 x weekly - 1 sets - 10 reps - 5 hold - Supine Heel Slide with Strap  - 3 x daily - 7 x weekly - 1 sets - 10 reps - 5 hold - Supine Knee Extension Strengthening  - 3 x daily - 7 x weekly - 1 sets - 10  reps - 3 hold - Sit to Stand with Counter Support  - 3 x daily - 7 x weekly - 1 sets - 5 reps - Sitting Knee Extension with Resistance  - 1 x daily - 7 x weekly - 2 sets - 10 reps - Seated Hamstring Curl with Anchored Resistance  - 1 x daily - 7 x weekly - 2 sets - 10 reps - Standing Terminal Knee Extension with Resistance  - 1 x daily - 7 x weekly - 2 sets - 10 reps - 5 hold  ASSESSMENT:  CLINICAL IMPRESSION: Pt arrives with RW as recommended. She demonstrates improved knee flexion ROM. Her extension with SLR is still -20 degrees. Improved extension noted with seated position, lacking 10 degrees. Pt instructed in standing TKE and seated LAQ, hamstring curls with resistance for HEP. Need FOTO status.  Pt will continue to benefit from skilled PT to address impairments to optimize function with less pain.   EVAL: Patient is a 62 y.o. female who was seen today for physical therapy evaluation and treatment for  R53.1 (ICD-10-CM) - Weakness  Z96.659 (ICD-10-CM) - S/P knee replacement  .Pt presents to PT walking c a RW, WBAT R LE, mod Ind. For 1 week s/p R TKR surgery, the pt's demonstrates good AAROM. Strength is limited with pt not being able to complete a SAQ or SLR. A HEP was initiated to address R knee/LE ROM and strength with pt returning demonstration. Pt will benefit from skilled PT 2w8 to address impairments to optimize function with less pain.   OBJECTIVE IMPAIRMENTS: decreased  activity tolerance, decreased mobility, difficulty walking, decreased ROM, decreased strength, increased edema, and pain.   ACTIVITY LIMITATIONS: carrying, lifting, bending, sitting, standing, squatting, sleeping, stairs, transfers, bed mobility, bathing, toileting, dressing, self feeding, hygiene/grooming, locomotion level, and caring for others  PARTICIPATION LIMITATIONS: meal prep, cleaning, laundry, driving, shopping, and community activity  PERSONAL FACTORS: Past/current experiences and Time since onset of injury/illness/exacerbation are also affecting patient's functional outcome.   REHAB POTENTIAL: Excellent  CLINICAL DECISION MAKING: Stable/uncomplicated  EVALUATION COMPLEXITY: Low   GOALS:  SHORT TERM GOALS: Target date: 09/18/23 Pt will be Ind in an initial HEP  Baseline: started Goal status: MET  2.  Increase L knee AROM to 5-100d to improve R knee function Baseline: 10-80d 09/07/23: significant quad lag Goal status: PARTIALLY MET (for flexion)   3.  Pt will be able to complete a SAQ and SLR independently Baseline: unable 09/07/23: unable 09/14/23: can complete with -20 d lag Goal status: ONGOING  LONG TERM GOALS: Target date: 10/30/23  Pt will be Ind in a final HEP to maintain achieved LOF Baseline: started Goal status: INITIAL  2.  Increase L knee AROM to 0-115d to for appropriate R knee function Baseline: 10-80d 09/29/23: 20-117 Goal status: ONGOING  3.  Increased R hip and knee strength to 4/5 or greater for appropriate R knee function Baseline:  Goal status: INITIAL  4.  Pt will be able to walk 875ft Indly and asc/dsc 12 steps c a HR and mod Ind for community mobility  Baseline:  Goal status: INITIAL  5.  Improve 5xSTS by MCID of 5" and by MCID of 75ft as indication of improved functional mobility  Baseline: Complete and 5xSTS when pt is no longer needing an assist device with walking Goal status: INITIAL  6.  Pt's FOTO score will improved  to the predicted value of 51% as indication of improved function  Baseline: 39% Goal  status: INITIAL   PLAN:  PT FREQUENCY: 2x/week  PT DURATION: 8 weeks  PLANNED INTERVENTIONS: Therapeutic exercises, Therapeutic activity, Neuromuscular re-education, Balance training, Gait training, Patient/Family education, Self Care, Joint mobilization, Stair training, Aquatic Therapy, Dry Needling, Electrical stimulation, Cryotherapy, Moist heat, Taping, Vasopneumatic device, Ultrasound, Ionotophoresis 4mg /ml Dexamethasone, Manual therapy, and Re-evaluation  PLAN FOR NEXT SESSION: FOTO status; assess response to HEP; progress therex as indicated; use of modalities, manual therapy; and TPDN as indicated. STRENGTHEN HIP    Jannette Spanner, [place] 09/29/23 9:07 AM Phone: [phone] Fax: [phone]    [facility] Authorization   Choose one: Rehabilitative  Standardized Assessment or Functional Outcome Tool: See Pain Assessment and Other FOTO  Score or Percent Disability: 39  Body Parts Treated (Select each separately):  Knee. Overall deficits/functional limitations for body part selected: moderate   If treatment provided at initial evaluation, no treatment charged due to lack of authorization.

## 2023-10-01 ENCOUNTER — Ambulatory Visit: Payer: Medicaid Other

## 2023-10-01 DIAGNOSIS — G8929 Other chronic pain: Secondary | ICD-10-CM | POA: Diagnosis not present

## 2023-10-01 DIAGNOSIS — M25561 Pain in right knee: Secondary | ICD-10-CM | POA: Diagnosis not present

## 2023-10-01 DIAGNOSIS — R262 Difficulty in walking, not elsewhere classified: Secondary | ICD-10-CM

## 2023-10-01 DIAGNOSIS — M6281 Muscle weakness (generalized): Secondary | ICD-10-CM | POA: Diagnosis not present

## 2023-10-01 NOTE — Therapy (Signed)
OUTPATIENT PHYSICAL THERAPY LOWER EXTREMITY TREATMENT   Patient Name: Sandra Lozano MRN: [id number] DOB:1961/10/12, 62 y.o., female Today's Date: 10/01/2023  END OF SESSION:  PT End of Session - 10/01/23 0853     Visit Number 9    Number of Visits 17    Date for PT Re-Evaluation 10/30/23    Authorization Type [place] MEDICAID [facility]    Authorization Time Period 08/31/23-10/30/23    Authorization - Visit Number 8    Authorization - Number of Visits 12    PT Start Time 0847    PT Stop Time 0929    PT Time Calculation (min) 42 min    Activity Tolerance Patient tolerated treatment well    Behavior During Therapy [facility] for tasks assessed/performed               Past Medical History:  Diagnosis Date   Arthritis    right knee, lower back   Chest wall pain 04/20/2021   Dyspnea    very rare -tx with albuterol neb sol if needed   Hyperlipidemia    Hypertension    Intertrigo 08/06/2022   Pre-diabetes    per patient - does not check blood sugar   Right hip pain 05/05/2018   Rupture of anterior cruciate ligament of right knee 02/15/2018   Sprain of medial collateral ligament of right knee 02/15/2018   SVD (spontaneous vaginal delivery)    x 5 - only 2 living, 1 stillborn and 1 premature at 7 months demise,1 child deceased   Wears dentures    upper and lower   Past Surgical History:  Procedure Laterality Date   FOOT ARTHRODESIS Left 11/02/2020   Procedure: LEFT MIDFOOT FUSION BASE 1ST AND 2ND METATARSAL;  Surgeon: Nadara Mustard, MD;  Location: [facility] OR;  Service: Orthopedics;  Laterality: Left;   LUMBAR FUSION  05/20/2019   GILL PROCEDURE, LEFT TRANSFORAMINAL LUMBAR INTERBODY FUSION, PEDICLE INSTRUMENTATION, BILATERAL FUSION (N/A   TOTAL KNEE ARTHROPLASTY Right 08/21/2023   Procedure: RIGHT TOTAL KNEE ARTHROPLASTY;  Surgeon: Nadara Mustard, MD;  Location: [facility] OR;  Service: Orthopedics;  Laterality: Right;   TUBAL LIGATION     Patient Active Problem List   Diagnosis Date Noted    Unilateral primary osteoarthritis, right knee 08/21/2023   Total knee replacement status, right 08/21/2023   Hidradenitis suppurativa of right axilla 06/03/2023   Vaginal irritation 02/21/2023   Skin lesion of back 02/21/2023   Tinea 12/18/2022   Left shoulder pain 08/25/2022   DJD (degenerative joint disease), multiple sites 08/06/2022   COPD, mild (HCC) 02/11/2022   Carpal tunnel syndrome, bilateral 12/21/2021   Arthritis of finger of left hand 11/26/2021   Traumatic arthritis of left foot    Lichen simplex chronicus 06/04/2020   Arthritis of midfoot 03/14/2020   History of lumbar fusion 03/14/2020   Well controlled diabetes mellitus (HCC) 11/07/2019   Nummular eczema 07/06/2019   Lumbar stenosis 05/20/2019   Acute left-sided low back pain without sciatica 07/18/2018   Osteoarthritis of right hip 06/03/2018   Chronic right shoulder pain 05/05/2018   Essential hypertension 02/15/2018    PCP: Lockie Mola, MD   REFERRING PROVIDER: Nadara Mustard, MD   REFERRING DIAG:  R53.1 (ICD-10-CM) - Weakness  Z96.659 (ICD-10-CM) - S/P knee replacement    THERAPY DIAG:  Chronic pain of right knee  Muscle weakness (generalized)  Difficulty in walking, not elsewhere classified  Rationale for Evaluation and Treatment: Rehabilitation  ONSET DATE: 08/21/23 surgery  SUBJECTIVE:  SUBJECTIVE STATEMENT: Pt reports she experienced R knee pain yesterday when she was on her fett for and extended time, but today she is not having pain.  PERTINENT HISTORY: HTN  PAIN:  Are you having pain? Yes: NPRS scale: 0/10 Pain location: R knee Pain description: ache, discomfort Aggravating factors: Moving, walking Relieving factors: Ice man, tylenol  PRECAUTIONS: Knee  RED FLAGS: None   WEIGHT BEARING RESTRICTIONS: No WBAT  FALLS:  Has patient fallen in last 6 months? No  LIVING ENVIRONMENT: Lives with: lives with their family Lives in: House/apartment Stairs: Yes: External: 14  steps; can reach both   OCCUPATION: Not working  PLOF: Independent with household mobility with device and Independent with community mobility with device  PATIENT GOALS: Good use of my R knee  NEXT MD VISIT: 09/08/23  OBJECTIVE:   PATIENT SURVEYS:  FOTO: Perceived function   39%, predicted   51%    COGNITION: Overall cognitive status: Within functional limits for tasks assessed     SENSATION: WFL  EDEMA:  Sweeling of the r knee  MUSCLE LENGTH: Hamstrings: Right NT deg; Left NT deg Thomas test: Right NT deg; Left NT deg  POSTURE: No Significant postural limitations  PALPATION: TTP of the peri-R knee and thigh  LOWER EXTREMITY ROM:  Active assisted ROM Right eval Left eval Right  Right 09/07/23 Right  09/14/23 Right 09/21/23 Right 09/29/23  Hip flexion         Hip extension         Hip abduction         Hip adduction         Hip internal rotation         Hip external rotation                  Knee flexion 80  98 AA 107 A 108 AA  117  Knee extension 10 lacking    -20 quad lag -20 -20  Ankle dorsiflexion         Ankle plantarflexion         Ankle inversion         Ankle eversion          (Blank rows = not tested)  LOWER EXTREMITY MMT:  MMT Right eval Left eval  Hip flexion 2   Hip extension    Hip abduction 2   Hip adduction    Hip internal rotation 2   Hip external rotation 2   Knee flexion 2   Knee extension 2   Ankle dorsiflexion    Ankle plantarflexion    Ankle inversion    Ankle eversion     (Blank rows = not tested)  LOWER EXTREMITY SPECIAL TESTS:  NT  FUNCTIONAL TESTS:  Complete and 5xSTS when pt is no longer needing an assist device with walking  GAIT: Distance walked: 129ft Assistive device utilized: Environmental consultant - 2 wheeled Level of assistance: Complete Independence Comments: Step to pattern c L LE   TODAY'S TREATMENT:  [facility] Adult PT Treatment:                                                DATE: 10/01/23 Therapeutic  Exercise: Nustep LE x 5 minutes  TKE blue band in doorway - pt instructed in set up for home Seated LAQ 2x10 4# SLR c QS x15-20  degree quad lag Bridge with ball squeeze  STS 2x10, favors R LE Therapeutic Activity: Gait training with SPC and CGA  [facility] Adult PT Treatment:                                                DATE: 09/29/23 Therapeutic Exercise: Nustep LE x 7 minutes  TKE blue band in doorway - pt instructed in set up for home H/S curl blue band in doorway- pt instructed in set up for Home  Mini squat to chair focusing on weight shift to RLE  Seated LAQ isometric into ball at 90 degrees 5 sec 10 x 2  LAQ red band 10 x 2  R seated leg press 20# x 10 Updated HEP  [facility] Adult PT Treatment:                                                DATE: 09/21/23 Therapeutic Exercise: Rec Bike L1 X 5 minutes  LAQ 5 sec x 10 LAQ with yellow band 5 sec  SLR -20 degree quad lag QS long sitting for visual feed back  SAQ 2# 5 sec x 12  SAQ with ball squeeze  Bridge with ball squeeze  Side hip abduction x 10 Prone h/s curl x 10 Prone hip ext  x 10 Therapeutic Activity: Gait With SPC - mod cues  Modalities: Ice pack right knee x 10 min Self Care: Discussed Quad lag and high risk of knee buckling- pt used clinic RW to exit clinic , husband outside waiting.   [facility] Adult PT Treatment:                                                DATE: 09/16/23 Therapeutic Exercise: Nustep UE/LE x 5 min L 4 adjusting seating progressively to more flexion TKE ball on wall x15 Wall slides 60d x15 TKE c GTB x20 Standing hip ext 2x10 eack LE Standing hip abd 2x10 each LE; R knee gave out on last rep of L hip abd Therapeutic Activity: Gait training in // bars using L hand on 1 rail to mimic cnae use Modalities: Cold pack to R knee x10 mins    [facility] Adult PT Treatment:                                                DATE: 09/14/23 Therapeutic Exercise: Nustep LE only x 5 min  Heel raises Retro stepping  weight shift for quad Mini squats at RW x 10 Seated heel slide to [employer] set 3 sec x 10 Standing TKE green band x12 for 5 sec Seated LAQ x 15- quad deficit improved, still present SLR x 10 with initial quad set, quad lag -20 Heel slides with strap  Supine ball squeeze with QS 3 sec x 15                       PATIENT EDUCATION:  Education details: Eval findings, POC, HEP, self care  Person educated: Patient Education method: Explanation, Demonstration, Tactile cues, Verbal cues, and Handouts Education comprehension: verbalized understanding, returned demonstration, verbal cues required, tactile cues required, and needs further education  HOME EXERCISE PROGRAM: Access Code: [email or web address] URL: [email or web address] Date: 08/28/2023 Prepared by: Joellyn Rued  Exercises - Supine Quad Set  - 3 x daily - 7 x weekly - 1 sets - 10 reps - 5 hold - Supine Heel Slide with Strap  - 3 x daily - 7 x weekly - 1 sets - 10 reps - 5 hold - Supine Knee Extension Strengthening  - 3 x daily - 7 x weekly - 1 sets - 10 reps - 3 hold - Sit to Stand with Counter Support  - 3 x daily - 7 x weekly - 1 sets - 5 reps - Sitting Knee Extension with Resistance  - 1 x daily - 7 x weekly - 2 sets - 10 reps - Seated Hamstring Curl with Anchored Resistance  - 1 x daily - 7 x weekly - 2 sets - 10 reps - Standing Terminal Knee Extension with Resistance  - 1 x daily - 7 x weekly - 2 sets - 10 reps - 5 hold  ASSESSMENT:  CLINICAL IMPRESSION: Pt arrives with RW as recommended. Gait training was completed for walking with a SPC. Pt walks at a slow pace and demonstrates unsteadiness. Pt is to continue with the RW outside of PT. With STS, pt is doing better with body position of weight over feet, but she favors the R LE c decreased wt bearing. AROM is good, but quad lag of -20 is present. PT continues to work on R LE strengthening to improve functional mobility and safety. Pt will continue to benefit from  skilled PT to address impairments to optimize function with less pain.   EVAL: Patient is a 62 y.o. female who was seen today for physical therapy evaluation and treatment for  R53.1 (ICD-10-CM) - Weakness  Z96.659 (ICD-10-CM) - S/P knee replacement  .Pt presents to PT walking c a RW, WBAT R LE, mod Ind. For 1 week s/p R TKR surgery, the pt's demonstrates good AAROM. Strength is limited with pt not being able to complete a SAQ or SLR. A HEP was initiated to address R knee/LE ROM and strength with pt returning demonstration. Pt will benefit from skilled PT 2w8 to address impairments to optimize function with less pain.   OBJECTIVE IMPAIRMENTS: decreased activity tolerance, decreased mobility, difficulty walking, decreased ROM, decreased strength, increased edema, and pain.   ACTIVITY LIMITATIONS: carrying, lifting, bending, sitting, standing, squatting, sleeping, stairs, transfers, bed mobility, bathing, toileting, dressing, self feeding, hygiene/grooming, locomotion level, and caring for others  PARTICIPATION LIMITATIONS: meal prep, cleaning, laundry, driving, shopping, and community activity  PERSONAL FACTORS: Past/current experiences and Time since onset of injury/illness/exacerbation are also affecting patient's functional outcome.   REHAB POTENTIAL: Excellent  CLINICAL DECISION MAKING: Stable/uncomplicated  EVALUATION COMPLEXITY: Low   GOALS:  SHORT TERM GOALS: Target date: 09/18/23 Pt will be Ind in an initial HEP  Baseline: started Goal status: MET  2.  Increase L knee AROM to 5-100d to improve R knee function Baseline: 10-80d 09/07/23: significant quad lag Goal status: PARTIALLY MET (for flexion)   3.  Pt will be able to complete a SAQ and SLR independently Baseline: unable 09/07/23: unable 09/14/23: can complete with -20 d lag Goal status: ONGOING  LONG TERM GOALS: Target date: 10/30/23  Pt will be  Ind in a final HEP to maintain achieved LOF Baseline: started Goal  status: INITIAL  2.  Increase L knee AROM to 0-115d to for appropriate R knee function Baseline: 10-80d 09/29/23: 20-117 10/01/23: c quad set 3d lacking, with SLR -20d quad lag Goal status: ONGOING  3.  Increased R hip and knee strength to 4/5 or greater for appropriate R knee function Baseline:  Goal status: INITIAL  4.  Pt will be able to walk 814ft Indly and asc/dsc 12 steps c a HR and mod Ind for community mobility  Baseline:  Goal status: INITIAL  5.  Improve 5xSTS by MCID of 5" and by MCID of 40ft as indication of improved functional mobility  Baseline: Complete and 5xSTS when pt is no longer needing an assist device with walking Goal status: INITIAL  6.  Pt's FOTO score will improved to the predicted value of 51% as indication of improved function  Baseline: 39% Goal status: INITIAL   PLAN:  PT FREQUENCY: 2x/week  PT DURATION: 8 weeks  PLANNED INTERVENTIONS: Therapeutic exercises, Therapeutic activity, Neuromuscular re-education, Balance training, Gait training, Patient/Family education, Self Care, Joint mobilization, Stair training, Aquatic Therapy, Dry Needling, Electrical stimulation, Cryotherapy, Moist heat, Taping, Vasopneumatic device, Ultrasound, Ionotophoresis 4mg /ml Dexamethasone, Manual therapy, and Re-evaluation  PLAN FOR NEXT SESSION: FOTO status; assess response to HEP; progress therex as indicated; use of modalities, manual therapy; and TPDN as indicated. STRENGTHEN HIP    Joellyn Rued MS, PT 10/01/23 9:34 AM    [employer] Authorization   Choose one: Rehabilitative  Standardized Assessment or Functional Outcome Tool: See Pain Assessment and Other FOTO  Score or Percent Disability: 39  Body Parts Treated (Select each separately):  Knee. Overall deficits/functional limitations for body part selected: moderate   If treatment provided at initial evaluation, no treatment charged due to lack of authorization.

## 2023-10-02 ENCOUNTER — Other Ambulatory Visit: Payer: Self-pay | Admitting: Student

## 2023-10-02 DIAGNOSIS — E119 Type 2 diabetes mellitus without complications: Secondary | ICD-10-CM

## 2023-10-05 ENCOUNTER — Ambulatory Visit: Payer: Medicaid Other | Admitting: Physical Therapy

## 2023-10-05 ENCOUNTER — Ambulatory Visit: Payer: Medicaid Other | Admitting: Family Medicine

## 2023-10-05 ENCOUNTER — Encounter: Payer: Self-pay | Admitting: Family Medicine

## 2023-10-05 ENCOUNTER — Encounter: Payer: Self-pay | Admitting: Physical Therapy

## 2023-10-05 VITALS — BP 124/65 | HR 87 | Wt 181.5 lb

## 2023-10-05 DIAGNOSIS — M6281 Muscle weakness (generalized): Secondary | ICD-10-CM | POA: Diagnosis not present

## 2023-10-05 DIAGNOSIS — M25561 Pain in right knee: Secondary | ICD-10-CM | POA: Diagnosis not present

## 2023-10-05 DIAGNOSIS — M158 Other polyosteoarthritis: Secondary | ICD-10-CM

## 2023-10-05 DIAGNOSIS — E119 Type 2 diabetes mellitus without complications: Secondary | ICD-10-CM | POA: Diagnosis not present

## 2023-10-05 DIAGNOSIS — R262 Difficulty in walking, not elsewhere classified: Secondary | ICD-10-CM | POA: Diagnosis not present

## 2023-10-05 DIAGNOSIS — Z7984 Long term (current) use of oral hypoglycemic drugs: Secondary | ICD-10-CM | POA: Diagnosis not present

## 2023-10-05 DIAGNOSIS — G8929 Other chronic pain: Secondary | ICD-10-CM | POA: Diagnosis not present

## 2023-10-05 MED ORDER — SEMAGLUTIDE(0.25 OR 0.5MG/DOS) 2 MG/1.5ML ~~LOC~~ SOPN
0.5000 mg | PEN_INJECTOR | SUBCUTANEOUS | 1 refills | Status: DC
Start: 2023-10-05 — End: 2023-12-14

## 2023-10-05 NOTE — Therapy (Signed)
OUTPATIENT PHYSICAL THERAPY LOWER EXTREMITY TREATMENT   Patient Name: Sandra Lozano MRN: [id number] DOB:January 23, 1961, 62 y.o., female Today's Date: 10/05/2023  END OF SESSION:  PT End of Session - 10/05/23 0935     Visit Number 10    Number of Visits 17    Date for PT Re-Evaluation 10/30/23    Authorization Type [place] MEDICAID [facility]    Authorization Time Period 08/31/23-10/30/23    Authorization - Visit Number 9    Authorization - Number of Visits 12    PT Start Time 0932    PT Stop Time 1012    PT Time Calculation (min) 40 min               Past Medical History:  Diagnosis Date   Arthritis    right knee, lower back   Chest wall pain 04/20/2021   Dyspnea    very rare -tx with albuterol neb sol if needed   Hyperlipidemia    Hypertension    Intertrigo 08/06/2022   Pre-diabetes    per patient - does not check blood sugar   Right hip pain 05/05/2018   Rupture of anterior cruciate ligament of right knee 02/15/2018   Sprain of medial collateral ligament of right knee 02/15/2018   SVD (spontaneous vaginal delivery)    x 5 - only 2 living, 1 stillborn and 1 premature at 7 months demise,1 child deceased   Wears dentures    upper and lower   Past Surgical History:  Procedure Laterality Date   FOOT ARTHRODESIS Left 11/02/2020   Procedure: LEFT MIDFOOT FUSION BASE 1ST AND 2ND METATARSAL;  Surgeon: Nadara Mustard, MD;  Location: [facility] OR;  Service: Orthopedics;  Laterality: Left;   LUMBAR FUSION  05/20/2019   GILL PROCEDURE, LEFT TRANSFORAMINAL LUMBAR INTERBODY FUSION, PEDICLE INSTRUMENTATION, BILATERAL FUSION (N/A   TOTAL KNEE ARTHROPLASTY Right 08/21/2023   Procedure: RIGHT TOTAL KNEE ARTHROPLASTY;  Surgeon: Nadara Mustard, MD;  Location: [facility] OR;  Service: Orthopedics;  Laterality: Right;   TUBAL LIGATION     Patient Active Problem List   Diagnosis Date Noted   Unilateral primary osteoarthritis, right knee 08/21/2023   Total knee replacement status, right 08/21/2023    Hidradenitis suppurativa of right axilla 06/03/2023   Vaginal irritation 02/21/2023   Skin lesion of back 02/21/2023   Tinea 12/18/2022   Left shoulder pain 08/25/2022   DJD (degenerative joint disease), multiple sites 08/06/2022   COPD, mild (HCC) 02/11/2022   Carpal tunnel syndrome, bilateral 12/21/2021   Arthritis of finger of left hand 11/26/2021   Traumatic arthritis of left foot    Lichen simplex chronicus 06/04/2020   Arthritis of midfoot 03/14/2020   History of lumbar fusion 03/14/2020   Well controlled diabetes mellitus (HCC) 11/07/2019   Nummular eczema 07/06/2019   Lumbar stenosis 05/20/2019   Acute left-sided low back pain without sciatica 07/18/2018   Osteoarthritis of right hip 06/03/2018   Chronic right shoulder pain 05/05/2018   Essential hypertension 02/15/2018    PCP: Lockie Mola, MD   REFERRING PROVIDER: Nadara Mustard, MD   REFERRING DIAG:  R53.1 (ICD-10-CM) - Weakness  Z96.659 (ICD-10-CM) - S/P knee replacement    THERAPY DIAG:  Chronic pain of right knee  Muscle weakness (generalized)  Rationale for Evaluation and Treatment: Rehabilitation  ONSET DATE: 08/21/23 surgery  SUBJECTIVE:   SUBJECTIVE STATEMENT: Pt reports she experienced R knee pain yesterday when she was on her fett for and extended time, but today she is not  having pain.  PERTINENT HISTORY: HTN  PAIN:  Are you having pain? Yes: NPRS scale: 0/10 Pain location: R knee Pain description: ache, discomfort Aggravating factors: Moving, walking Relieving factors: Ice man, tylenol  PRECAUTIONS: Knee  RED FLAGS: None   WEIGHT BEARING RESTRICTIONS: No WBAT  FALLS:  Has patient fallen in last 6 months? No  LIVING ENVIRONMENT: Lives with: lives with their family Lives in: House/apartment Stairs: Yes: External: 14 steps; can reach both   OCCUPATION: Not working  PLOF: Independent with household mobility with device and Independent with community mobility with  device  PATIENT GOALS: Good use of my R knee  NEXT MD VISIT: 09/08/23  OBJECTIVE:   PATIENT SURVEYS:  FOTO: Perceived function   39%, predicted   51%    COGNITION: Overall cognitive status: Within functional limits for tasks assessed     SENSATION: WFL  EDEMA:  Sweeling of the r knee  MUSCLE LENGTH: Hamstrings: Right NT deg; Left NT deg Thomas test: Right NT deg; Left NT deg  POSTURE: No Significant postural limitations  PALPATION: TTP of the peri-R knee and thigh  LOWER EXTREMITY ROM:  Active assisted ROM Right eval Left eval Right  Right 09/07/23 Right  09/14/23 Right 09/21/23 Right 09/29/23  Hip flexion         Hip extension         Hip abduction         Hip adduction         Hip internal rotation         Hip external rotation                  Knee flexion 80  98 AA 107 A 108 AA  117  Knee extension 10 lacking    -20 quad lag -20 -20  Ankle dorsiflexion         Ankle plantarflexion         Ankle inversion         Ankle eversion          (Blank rows = not tested)  LOWER EXTREMITY MMT:  MMT Right eval Left eval  Hip flexion 2   Hip extension    Hip abduction 2   Hip adduction    Hip internal rotation 2   Hip external rotation 2   Knee flexion 2   Knee extension 2   Ankle dorsiflexion    Ankle plantarflexion    Ankle inversion    Ankle eversion     (Blank rows = not tested)  LOWER EXTREMITY SPECIAL TESTS:  NT  FUNCTIONAL TESTS:  Complete and 5xSTS when pt is no longer needing an assist device with walking 2 MWT with RW 186 Feet   GAIT: Distance walked: 176ft Assistive device utilized: Environmental consultant - 2 wheeled Level of assistance: Complete Independence Comments: Step to pattern c L LE   TODAY'S TREATMENT:  [facility] Adult PT Treatment:                                                DATE: 10/05/23 Therapeutic Exercise: Nustep  STS x 10 SAQ 4# Supine march Right heel slides on slide board Bridge with ball squeeze  Sidelying red band  clam  15 Reverse clam x 15 Hip abduction x 10- pillows between knees to elevate Prone h/s curls Prone QS  Prone alternating hp extension Right seated leg press   Therapeutic Activity: 2 MWT 186 feet with RW 5 x STS 15.2 sec     [facility] Adult PT Treatment:                                                DATE: 10/01/23 Therapeutic Exercise: Nustep LE x 5 minutes  TKE blue band in doorway - pt instructed in set up for home Seated LAQ 2x10 4# SLR c QS x15-20 degree quad lag Bridge with ball squeeze  STS 2x10, favors R LE Therapeutic Activity: Gait training with SPC and CGA  [facility] Adult PT Treatment:                                                DATE: 09/29/23 Therapeutic Exercise: Nustep LE x 7 minutes  TKE blue band in doorway - pt instructed in set up for home H/S curl blue band in doorway- pt instructed in set up for Home  Mini squat to chair focusing on weight shift to RLE  Seated LAQ isometric into ball at 90 degrees 5 sec 10 x 2  LAQ red band 10 x 2  R seated leg press 20# x 10 Updated HEP  [facility] Adult PT Treatment:                                                DATE: 09/21/23 Therapeutic Exercise: Rec Bike L1 X 5 minutes  LAQ 5 sec x 10 LAQ with yellow band 5 sec  SLR -20 degree quad lag QS long sitting for visual feed back  SAQ 2# 5 sec x 12  SAQ with ball squeeze  Bridge with ball squeeze  Side hip abduction x 10 Prone h/s curl x 10 Prone hip ext  x 10 Therapeutic Activity: Gait With SPC - mod cues  Modalities: Ice pack right knee x 10 min Self Care: Discussed Quad lag and high risk of knee buckling- pt used clinic RW to exit clinic , husband outside waiting.   [facility] Adult PT Treatment:                                                DATE: 09/16/23 Therapeutic Exercise: Nustep UE/LE x 5 min L 4 adjusting seating progressively to more flexion TKE ball on wall x15 Wall slides 60d x15 TKE c GTB x20 Standing hip ext 2x10 eack LE Standing hip abd 2x10 each LE; R  knee gave out on last rep of L hip abd Therapeutic Activity: Gait training in // bars using L hand on 1 rail to mimic cnae use Modalities: Cold pack to R knee x10 mins    [facility] Adult PT Treatment:  DATE: 09/14/23 Therapeutic Exercise: Nustep LE only x 5 min  Heel raises Retro stepping weight shift for quad Mini squats at RW x 10 Seated heel slide to [employer] set 3 sec x 10 Standing TKE green band x12 for 5 sec Seated LAQ x 15- quad deficit improved, still present SLR x 10 with initial quad set, quad lag -20 Heel slides with strap  Supine ball squeeze with QS 3 sec x 15                       PATIENT EDUCATION:  Education details: Eval findings, POC, HEP, self care  Person educated: Patient Education method: Explanation, Demonstration, Tactile cues, Verbal cues, and Handouts Education comprehension: verbalized understanding, returned demonstration, verbal cues required, tactile cues required, and needs further education  HOME EXERCISE PROGRAM: Access Code: [email or web address] URL: [email or web address] Date: 08/28/2023 Prepared by: Joellyn Rued  Exercises - Supine Quad Set  - 3 x daily - 7 x weekly - 1 sets - 10 reps - 5 hold - Supine Heel Slide with Strap  - 3 x daily - 7 x weekly - 1 sets - 10 reps - 5 hold - Supine Knee Extension Strengthening  - 3 x daily - 7 x weekly - 1 sets - 10 reps - 3 hold - Sit to Stand with Counter Support  - 3 x daily - 7 x weekly - 1 sets - 5 reps - Sitting Knee Extension with Resistance  - 1 x daily - 7 x weekly - 2 sets - 10 reps - Seated Hamstring Curl with Anchored Resistance  - 1 x daily - 7 x weekly - 2 sets - 10 reps - Standing Terminal Knee Extension with Resistance  - 1 x daily - 7 x weekly - 2 sets - 10 reps - 5 hold  ASSESSMENT:  CLINICAL IMPRESSION: Pt arrives with RW as recommended. 2 MWT and 5 x STS tests were captured. On first measure pt demonstrated improved quad lag  however quickly fatigues and can only maintain -20 quad lag. Session focused on RLE strengthening as pt has deficits in entire LE, not just the quad.   Pt will continue to benefit from skilled PT to address impairments to optimize function with less pain.   EVAL: Patient is a 62 y.o. female who was seen today for physical therapy evaluation and treatment for  R53.1 (ICD-10-CM) - Weakness  Z96.659 (ICD-10-CM) - S/P knee replacement  .Pt presents to PT walking c a RW, WBAT R LE, mod Ind. For 1 week s/p R TKR surgery, the pt's demonstrates good AAROM. Strength is limited with pt not being able to complete a SAQ or SLR. A HEP was initiated to address R knee/LE ROM and strength with pt returning demonstration. Pt will benefit from skilled PT 2w8 to address impairments to optimize function with less pain.   OBJECTIVE IMPAIRMENTS: decreased activity tolerance, decreased mobility, difficulty walking, decreased ROM, decreased strength, increased edema, and pain.   ACTIVITY LIMITATIONS: carrying, lifting, bending, sitting, standing, squatting, sleeping, stairs, transfers, bed mobility, bathing, toileting, dressing, self feeding, hygiene/grooming, locomotion level, and caring for others  PARTICIPATION LIMITATIONS: meal prep, cleaning, laundry, driving, shopping, and community activity  PERSONAL FACTORS: Past/current experiences and Time since onset of injury/illness/exacerbation are also affecting patient's functional outcome.   REHAB POTENTIAL: Excellent  CLINICAL DECISION MAKING: Stable/uncomplicated  EVALUATION COMPLEXITY: Low   GOALS:  SHORT TERM GOALS: Target date: 09/18/23 Pt will be Ind in an  initial HEP  Baseline: started Goal status: MET  2.  Increase L knee AROM to 5-100d to improve R knee function Baseline: 10-80d 09/07/23: significant quad lag Goal status: PARTIALLY MET (for flexion)   3.  Pt will be able to complete a SAQ and SLR independently Baseline: unable 09/07/23:  unable 09/14/23: can complete with -20 d lag Goal status: ONGOING  LONG TERM GOALS: Target date: 10/30/23  Pt will be Ind in a final HEP to maintain achieved LOF Baseline: started Goal status: INITIAL  2.  Increase L knee AROM to 0-115d to for appropriate R knee function Baseline: 10-80d 09/29/23: 20-117 10/01/23: c quad set 3d lacking, with SLR -20d quad lag Goal status: ONGOING  3.  Increased R hip and knee strength to 4/5 or greater for appropriate R knee function Baseline:  Goal status: INITIAL  4.  Pt will be able to walk 823ft Indly and asc/dsc 12 steps c a HR and mod Ind for community mobility  Baseline:  Goal status: INITIAL  5.  Improve 5xSTS by MCID of 5" and by MCID of 75ft as indication of improved functional mobility  Baseline: Complete and 5xSTS when pt is no longer needing an assist device with walking  Goal status: INITIAL  6.  Pt's FOTO score will improved to the predicted value of 51% as indication of improved function  Baseline: 39% Goal status: INITIAL   PLAN:  PT FREQUENCY: 2x/week  PT DURATION: 8 weeks  PLANNED INTERVENTIONS: Therapeutic exercises, Therapeutic activity, Neuromuscular re-education, Balance training, Gait training, Patient/Family education, Self Care, Joint mobilization, Stair training, Aquatic Therapy, Dry Needling, Electrical stimulation, Cryotherapy, Moist heat, Taping, Vasopneumatic device, Ultrasound, Ionotophoresis 4mg /ml Dexamethasone, Manual therapy, and Re-evaluation  PLAN FOR NEXT SESSION: FOTO status; assess response to HEP; progress therex as indicated; use of modalities, manual therapy; and TPDN as indicated. STRENGTHEN HIP    Jannette Spanner, [place] 10/05/23 10:13 AM Phone: [phone] Fax: [phone]     [facility] Authorization   Choose one: Rehabilitative  Standardized Assessment or Functional Outcome Tool: See Pain Assessment and Other FOTO  Score or Percent Disability: 39  Body Parts Treated  (Select each separately):  Knee. Overall deficits/functional limitations for body part selected: moderate   If treatment provided at initial evaluation, no treatment charged due to lack of authorization.

## 2023-10-05 NOTE — Progress Notes (Signed)
    SUBJECTIVE:   CHIEF COMPLAINT / HPI:   Arthritis Pain Medicine  Patient had requested meloxicam refill and I had requested patient come to discuss with me before refilling. Patient had been on almost continuous meloxicam for years. Patient has had much relief after having knee surgery recently. Patient said that she has been able to have enough relief with tylenol. She is able to move around a lot more than she could before. She is very excited at the prospect of being able to be more active.   Ozempic  Patient wants to stop ozempic as now she has lost almost 30 lbs. She has been spacing out her last few doses of 1 mg unintentionally due to surgery and other causes.   PERTINENT  PMH / PSH: HTN, COPD, Osteoarthritis  OBJECTIVE:   BP 124/65   Pulse 87   Wt 181 lb 8 oz (82.3 kg)   LMP  (LMP Unknown)   SpO2 100%   BMI 31.15 kg/m   General: well appearing, in no acute distress, has walker CV: RRR, radial pulses equal and palpable Resp: Normal work of breathing on room air  ASSESSMENT/PLAN:   Assessment & Plan Well controlled diabetes mellitus (HCC) Concerned for weight gain with cessation of ozempic. However, patient is able to be more active now and would like to try it.  - Space out 1mg  ozempic doses, then start 0.5 mg doses  - Follow up in 2 months  Other osteoarthritis involving multiple joints Pain controlled at this time. S/p Knee replacement  - discontinue meloxicam - Continue with voltaren and tylenol, will ask for meloxicam if needed - Continue physical thearpy       Lockie Mola, MD [facility]

## 2023-10-05 NOTE — Assessment & Plan Note (Signed)
Concerned for weight gain with cessation of ozempic. However, patient is able to be more active now and would like to try it.  - Space out 1mg  ozempic doses, then start 0.5 mg doses  - Follow up in 2 months

## 2023-10-05 NOTE — Assessment & Plan Note (Signed)
Pain controlled at this time. S/p Knee replacement  - discontinue meloxicam - Continue with voltaren and tylenol, will ask for meloxicam if needed - Continue physical thearpy

## 2023-10-05 NOTE — Patient Instructions (Addendum)
It was wonderful to see you today.  Please bring ALL of your medications with you to every visit.   Today we talked about:  Arthritis pain - I would like you to take a break from the meloxicam so that it does not affect your stomach lining. If you need more than tylenol, you can take some ibuprofen and take it with food.   For the ozempic you can space out the doses and then start 0.5 every week and then space that out. I would like to follow up with you in 2 months to make sure that you are not regaining the weight and that you are not having other side effects from stopping.   Please try to start some yoga to gain core strength and help with balance.   Thank you for choosing [facility].   Please call [phone] with any questions about today's appointment.  Please be sure to schedule follow up at the front desk before you leave today.   Lockie Mola, MD  Family Medicine

## 2023-10-06 NOTE — Therapy (Incomplete)
OUTPATIENT PHYSICAL THERAPY LOWER EXTREMITY TREATMENT   Patient Name: Sandra Lozano MRN: [id number] DOB:09/08/1961, 62 y.o., female Today's Date: 10/07/2023  END OF SESSION:  PT End of Session - 10/07/23 0851     Visit Number 11    Number of Visits 17    Date for PT Re-Evaluation 10/30/23    Authorization Type [place] MEDICAID [facility]    Authorization Time Period 08/31/23-10/30/23    Authorization - Visit Number 10    Authorization - Number of Visits 12    PT Start Time 0845    PT Stop Time 0928    PT Time Calculation (min) 43 min    Activity Tolerance Patient tolerated treatment well    Behavior During Therapy [facility] for tasks assessed/performed                Past Medical History:  Diagnosis Date   Arthritis    right knee, lower back   Chest wall pain 04/20/2021   Dyspnea    very rare -tx with albuterol neb sol if needed   Hyperlipidemia    Hypertension    Intertrigo 08/06/2022   Pre-diabetes    per patient - does not check blood sugar   Right hip pain 05/05/2018   Rupture of anterior cruciate ligament of right knee 02/15/2018   Sprain of medial collateral ligament of right knee 02/15/2018   SVD (spontaneous vaginal delivery)    x 5 - only 2 living, 1 stillborn and 1 premature at 7 months demise,1 child deceased   Wears dentures    upper and lower   Past Surgical History:  Procedure Laterality Date   FOOT ARTHRODESIS Left 11/02/2020   Procedure: LEFT MIDFOOT FUSION BASE 1ST AND 2ND METATARSAL;  Surgeon: Nadara Mustard, MD;  Location: [facility] OR;  Service: Orthopedics;  Laterality: Left;   LUMBAR FUSION  05/20/2019   GILL PROCEDURE, LEFT TRANSFORAMINAL LUMBAR INTERBODY FUSION, PEDICLE INSTRUMENTATION, BILATERAL FUSION (N/A   TOTAL KNEE ARTHROPLASTY Right 08/21/2023   Procedure: RIGHT TOTAL KNEE ARTHROPLASTY;  Surgeon: Nadara Mustard, MD;  Location: [facility] OR;  Service: Orthopedics;  Laterality: Right;   TUBAL LIGATION     Patient Active Problem List   Diagnosis Date  Noted   Unilateral primary osteoarthritis, right knee 08/21/2023   Total knee replacement status, right 08/21/2023   Hidradenitis suppurativa of right axilla 06/03/2023   Vaginal irritation 02/21/2023   Skin lesion of back 02/21/2023   Tinea 12/18/2022   Left shoulder pain 08/25/2022   DJD (degenerative joint disease), multiple sites 08/06/2022   COPD, mild (HCC) 02/11/2022   Carpal tunnel syndrome, bilateral 12/21/2021   Arthritis of finger of left hand 11/26/2021   Traumatic arthritis of left foot    Lichen simplex chronicus 06/04/2020   Arthritis of midfoot 03/14/2020   History of lumbar fusion 03/14/2020   Well controlled diabetes mellitus (HCC) 11/07/2019   Nummular eczema 07/06/2019   Lumbar stenosis 05/20/2019   Acute left-sided low back pain without sciatica 07/18/2018   Osteoarthritis of right hip 06/03/2018   Chronic right shoulder pain 05/05/2018   Essential hypertension 02/15/2018    PCP: Lockie Mola, MD   REFERRING PROVIDER: Nadara Mustard, MD   REFERRING DIAG:  R53.1 (ICD-10-CM) - Weakness  Z96.659 (ICD-10-CM) - S/P knee replacement    THERAPY DIAG:  Chronic pain of right knee  Muscle weakness (generalized)  Difficulty in walking, not elsewhere classified  Rationale for Evaluation and Treatment: Rehabilitation  ONSET DATE: 08/21/23 surgery  SUBJECTIVE:  SUBJECTIVE STATEMENT: Pt reports she is doing well today.    PERTINENT HISTORY: HTN  PAIN:  Are you having pain? Yes: NPRS scale: 0/10 Pain location: R knee Pain description: ache, discomfort Aggravating factors: Moving, walking Relieving factors: Ice man, tylenol  PRECAUTIONS: Knee  RED FLAGS: None   WEIGHT BEARING RESTRICTIONS: No WBAT  FALLS:  Has patient fallen in last 6 months? No  LIVING ENVIRONMENT: Lives with: lives with their family Lives in: House/apartment Stairs: Yes: External: 14 steps; can reach both   OCCUPATION: Not working  PLOF: Independent with household  mobility with device and Independent with community mobility with device  PATIENT GOALS: Good use of my R knee  NEXT MD VISIT: 09/08/23  OBJECTIVE:   PATIENT SURVEYS:  FOTO: Perceived function   39%, predicted   51%    COGNITION: Overall cognitive status: Within functional limits for tasks assessed     SENSATION: WFL  EDEMA:  Swelling of the R knee  MUSCLE LENGTH: Hamstrings: Right NT deg; Left NT deg Thomas test: Right NT deg; Left NT deg  POSTURE: No Significant postural limitations  PALPATION: TTP of the peri-R knee and thigh  LOWER EXTREMITY ROM:  Active assisted ROM Right eval Left eval Right  Right 09/07/23 Right  09/14/23 Right 09/21/23 Right 09/29/23  Hip flexion         Hip extension         Hip abduction         Hip adduction         Hip internal rotation         Hip external rotation                  Knee flexion 80  98 AA 107 A 108 AA  117  Knee extension 10 lacking    -20 quad lag -20 -20  Ankle dorsiflexion         Ankle plantarflexion         Ankle inversion         Ankle eversion          (Blank rows = not tested)  LOWER EXTREMITY MMT:  MMT Right eval Left eval  Hip flexion 2   Hip extension    Hip abduction 2   Hip adduction    Hip internal rotation 2   Hip external rotation 2   Knee flexion 2   Knee extension 2   Ankle dorsiflexion    Ankle plantarflexion    Ankle inversion    Ankle eversion     (Blank rows = not tested)  LOWER EXTREMITY SPECIAL TESTS:  NT  FUNCTIONAL TESTS:  Complete and 5xSTS when pt is no longer needing an assist device with walking 2 MWT with RW 186 Feet   GAIT: Distance walked: 189ft Assistive device utilized: Environmental consultant - 2 wheeled Level of assistance: Complete Independence Comments: Step to pattern c L LE   TODAY'S TREATMENT:  [facility] Adult PT Treatment:                                                DATE: 10/07/23 Therapeutic Exercise: Nustep L8 UE/LE QS x10 5" SLR c QS x10 3", quad  lag presnet SAQ 2x10 3", quad lag present S/L hip abd 2x10 STS 2 x 10, airex Standing TKE BluTB 2x10 Therapeutic Activity:  Gait training c SPC FOTO reassessed and reviewed  [facility] Adult PT Treatment:                                                DATE: 10/05/23 Therapeutic Exercise: Nustep  STS x 10 SAQ 4# Supine march Right heel slides on slide board Bridge with ball squeeze  Sidelying red band clam  15 Reverse clam x 15 Hip abduction x 10- pillows between knees to elevate Prone h/s curls Prone QS  Prone alternating hp extension Right seated leg press   Therapeutic Activity: 2 MWT 186 feet with RW 5 x STS 15.2 sec  [facility] Adult PT Treatment:                                                DATE: 10/01/23 Therapeutic Exercise: Nustep LE x 5 minutes  TKE blue band in doorway - pt instructed in set up for home Seated LAQ 2x10 4# SLR c QS x15-20 degree quad lag Bridge with ball squeeze  STS 2x10, favors R LE Therapeutic Activity: Gait training with SPC and CGA  [facility] Adult PT Treatment:                                                DATE: 09/29/23 Therapeutic Exercise: Nustep LE x 7 minutes  TKE blue band in doorway - pt instructed in set up for home H/S curl blue band in doorway- pt instructed in set up for Home  Mini squat to chair focusing on weight shift to RLE  Seated LAQ isometric into ball at 90 degrees 5 sec 10 x 2  LAQ red band 10 x 2  R seated leg press 20# x 10 Updated HEP                     PATIENT EDUCATION:  Education details: Eval findings, POC, HEP, self care  Person educated: Patient Education method: Explanation, Demonstration, Tactile cues, Verbal cues, and Handouts Education comprehension: verbalized understanding, returned demonstration, verbal cues required, tactile cues required, and needs further education  HOME EXERCISE PROGRAM: Access Code: [email or web address] URL: [email or web address] Date: 08/28/2023 Prepared by: Joellyn Rued  Exercises - Supine Quad Set  - 3 x daily - 7 x weekly - 1 sets - 10 reps - 5 hold - Supine Heel Slide with Strap  - 3 x daily - 7 x weekly - 1 sets - 10 reps - 5 hold - Supine Knee Extension Strengthening  - 3 x daily - 7 x weekly - 1 sets - 10 reps - 3 hold - Sit to Stand with Counter Support  - 3 x daily - 7 x weekly - 1 sets - 5 reps - Sitting Knee Extension with Resistance  - 1 x daily - 7 x weekly - 2 sets - 10 reps - Seated Hamstring Curl with Anchored Resistance  - 1 x daily - 7 x weekly - 2 sets - 10 reps - Standing Terminal Knee Extension with Resistance  - 1 x daily - 7 x weekly - 2 sets - 10  reps - 5 hold  ASSESSMENT:  CLINICAL IMPRESSION: Gait training was completed with SPC. Pt demonstrated quad control during R LE stance for short distances completed using the SPC. Pt reports the R leg continues to feel weak when walking, but overall feels a little stronger. Due to the degree of quad lag with therex, continue to recommend gait with RW for safety. FOTO was reassessd with pt meeting the projected goal. Will consider Russian stim and/or BFR for quad strengthening with future PT sessions. Pt will continue to benefit from skilled PT to address impairments to optimize functional mobility.   EVAL: Patient is a 62 y.o. female who was seen today for physical therapy evaluation and treatment for  R53.1 (ICD-10-CM) - Weakness  Z96.659 (ICD-10-CM) - S/P knee replacement  .Pt presents to PT walking c a RW, WBAT R LE, mod Ind. For 1 week s/p R TKR surgery, the pt's demonstrates good AAROM. Strength is limited with pt not being able to complete a SAQ or SLR. A HEP was initiated to address R knee/LE ROM and strength with pt returning demonstration. Pt will benefit from skilled PT 2w8 to address impairments to optimize function with less pain.   OBJECTIVE IMPAIRMENTS: decreased activity tolerance, decreased mobility, difficulty walking, decreased ROM, decreased strength, increased edema, and  pain.   ACTIVITY LIMITATIONS: carrying, lifting, bending, sitting, standing, squatting, sleeping, stairs, transfers, bed mobility, bathing, toileting, dressing, self feeding, hygiene/grooming, locomotion level, and caring for others  PARTICIPATION LIMITATIONS: meal prep, cleaning, laundry, driving, shopping, and community activity  PERSONAL FACTORS: Past/current experiences and Time since onset of injury/illness/exacerbation are also affecting patient's functional outcome.   REHAB POTENTIAL: Excellent  CLINICAL DECISION MAKING: Stable/uncomplicated  EVALUATION COMPLEXITY: Low   GOALS:  SHORT TERM GOALS: Target date: 09/18/23 Pt will be Ind in an initial HEP  Baseline: started Goal status: MET  2.  Increase L knee AROM to 5-100d to improve R knee function Baseline: 10-80d 09/07/23: significant quad lag Goal status: PARTIALLY MET (for flexion)   3.  Pt will be able to complete a SAQ and SLR independently Baseline: unable 09/07/23: unable 09/14/23: can complete with -20 d lag Goal status: ONGOING  LONG TERM GOALS: Target date: 10/30/23  Pt will be Ind in a final HEP to maintain achieved LOF Baseline: started Goal status: INITIAL  2.  Increase L knee AROM to 0-115d to for appropriate R knee function Baseline: 10-80d 09/29/23: 20-117 10/01/23: c quad set 3d lacking, with SLR -20d quad lag Goal status: ONGOING  3.  Increased R hip and knee strength to 4/5 or greater for appropriate R knee function Baseline:  Goal status: INITIAL  4.  Pt will be able to walk 869ft Indly and asc/dsc 12 steps c a HR and mod Ind for community mobility  Baseline:  Goal status: INITIAL  5.  Improve 5xSTS by MCID of 5" and by MCID of 97ft as indication of improved functional mobility  Baseline: Complete and 5xSTS when pt is no longer needing an assist device with walking  Goal status: INITIAL  6.  Pt's FOTO score will improved to the predicted value of 51% as indication of improved  function  Baseline: 39%.  10/07/23=54% Goal status: MET   PLAN:  PT FREQUENCY: 2x/week  PT DURATION: 8 weeks  PLANNED INTERVENTIONS: Therapeutic exercises, Therapeutic activity, Neuromuscular re-education, Balance training, Gait training, Patient/Family education, Self Care, Joint mobilization, Stair training, Aquatic Therapy, Dry Needling, Electrical stimulation, Cryotherapy, Moist heat, Taping, Vasopneumatic device, Ultrasound,  Ionotophoresis 4mg /ml Dexamethasone, Manual therapy, and Re-evaluation  PLAN FOR NEXT SESSION: FOTO status; assess response to HEP; progress therex as indicated; use of modalities, manual therapy; and TPDN as indicated. STRENGTHEN HIP    Joellyn Rued MS, PT 10/07/23 12:24 PM  Joellyn Rued MS, PT 10/07/23 5:25 PM   [employer] Authorization   Choose one: Rehabilitative  Standardized Assessment or Functional Outcome Tool: See Pain Assessment and Other FOTO  Score or Percent Disability: 39  Body Parts Treated (Select each separately):  Knee. Overall deficits/functional limitations for body part selected: moderate   If treatment provided at initial evaluation, no treatment charged due to lack of authorization.

## 2023-10-07 ENCOUNTER — Ambulatory Visit: Payer: Medicaid Other

## 2023-10-07 DIAGNOSIS — R262 Difficulty in walking, not elsewhere classified: Secondary | ICD-10-CM

## 2023-10-07 DIAGNOSIS — M25561 Pain in right knee: Secondary | ICD-10-CM | POA: Diagnosis not present

## 2023-10-07 DIAGNOSIS — M6281 Muscle weakness (generalized): Secondary | ICD-10-CM

## 2023-10-07 DIAGNOSIS — G8929 Other chronic pain: Secondary | ICD-10-CM

## 2023-10-12 ENCOUNTER — Ambulatory Visit: Payer: Medicaid Other | Admitting: Physical Therapy

## 2023-10-12 ENCOUNTER — Encounter: Payer: Self-pay | Admitting: Physical Therapy

## 2023-10-12 DIAGNOSIS — R262 Difficulty in walking, not elsewhere classified: Secondary | ICD-10-CM | POA: Diagnosis not present

## 2023-10-12 DIAGNOSIS — M25561 Pain in right knee: Secondary | ICD-10-CM | POA: Diagnosis not present

## 2023-10-12 DIAGNOSIS — M6281 Muscle weakness (generalized): Secondary | ICD-10-CM | POA: Diagnosis not present

## 2023-10-12 DIAGNOSIS — G8929 Other chronic pain: Secondary | ICD-10-CM | POA: Diagnosis not present

## 2023-10-12 NOTE — Therapy (Signed)
OUTPATIENT PHYSICAL THERAPY LOWER EXTREMITY TREATMENT   Patient Name: Sandra Lozano MRN: [id number] DOB:February 12, 1961, 62 y.o., female Today's Date: 10/12/2023  END OF SESSION:  PT End of Session - 10/12/23 0811     Visit Number 12    Number of Visits 17    Date for PT Re-Evaluation 10/30/23    Authorization Type [employer] MED[employer] [facility]    Authorization Time Period 08/31/23-10/30/23    Authorization - Visit Number 11    Authorization - Number of Visits 12    PT Start Time 0800    PT Stop Time 0845    PT Time Calculation (min) 45 min                Past Medical History:  Diagnosis Date   Arthritis    right knee, lower back   Chest wall pain 04/20/2021   Dyspnea    very rare -tx with albuterol neb sol if needed   Hyperlipidemia    Hypertension    Intertrigo 08/06/2022   Pre-diabetes    per patient - does not check blood sugar   Right hip pain 05/05/2018   Rupture of anterior cruciate ligament of right knee 02/15/2018   Sprain of medial collateral ligament of right knee 02/15/2018   SVD (spontaneous vaginal delivery)    x 5 - only 2 living, 1 stillborn and 1 premature at 7 months demise,1 child deceased   Wears dentures    upper and lower   Past Surgical History:  Procedure Laterality Date   FOOT ARTHRODESIS Left 11/02/2020   Procedure: LEFT MIDFOOT FUSION BASE 1ST AND 2ND METATARSAL;  Surgeon: Nadara Mustard, MD;  Location: [facility] OR;  Service: Orthopedics;  Laterality: Left;   LUMBAR FUSION  05/20/2019   GILL PROCEDURE, LEFT TRANSFORAMINAL LUMBAR INTERBODY FUSION, PEDICLE INSTRUMENTATION, BILATERAL FUSION (N/A   TOTAL KNEE ARTHROPLASTY Right 08/21/2023   Procedure: RIGHT TOTAL KNEE ARTHROPLASTY;  Surgeon: Nadara Mustard, MD;  Location: [facility] OR;  Service: Orthopedics;  Laterality: Right;   TUBAL LIGATION     Patient Active Problem List   Diagnosis Date Noted   Unilateral primary osteoarthritis, right knee 08/21/2023   Total knee replacement status, right 08/21/2023    Hidradenitis suppurativa of right axilla 06/03/2023   Vaginal irritation 02/21/2023   Skin lesion of back 02/21/2023   Tinea 12/18/2022   Left shoulder pain 08/25/2022   DJD (degenerative joint disease), multiple sites 08/06/2022   COPD, mild (HCC) 02/11/2022   Carpal tunnel syndrome, bilateral 12/21/2021   Arthritis of finger of left hand 11/26/2021   Traumatic arthritis of left foot    Lichen simplex chronicus 06/04/2020   Arthritis of midfoot 03/14/2020   History of lumbar fusion 03/14/2020   Well controlled diabetes mellitus (HCC) 11/07/2019   Nummular eczema 07/06/2019   Lumbar stenosis 05/20/2019   Acute left-sided low back pain without sciatica 07/18/2018   Osteoarthritis of right hip 06/03/2018   Chronic right shoulder pain 05/05/2018   Essential hypertension 02/15/2018    PCP: Lockie Mola, MD   REFERRING PROVIDER: Nadara Mustard, MD   REFERRING DIAG:  R53.1 (ICD-10-CM) - Weakness  Z96.659 (ICD-10-CM) - S/P knee replacement    THERAPY DIAG:  Chronic pain of right knee  Muscle weakness (generalized)  Rationale for Evaluation and Treatment: Rehabilitation  ONSET DATE: 08/21/23 surgery  SUBJECTIVE:   SUBJECTIVE STATEMENT: Pt reports she is doing well today.    PERTINENT HISTORY: HTN  PAIN:  Are you having pain? Yes: NPRS  scale: 0/10 Pain location: R knee Pain description: ache, discomfort Aggravating factors: Moving, walking Relieving factors: Ice man, tylenol  PRECAUTIONS: Knee  RED FLAGS: None   WEIGHT BEARING RESTRICTIONS: No WBAT  FALLS:  Has patient fallen in last 6 months? No  LIVING ENVIRONMENT: Lives with: lives with their family Lives in: House/apartment Stairs: Yes: External: 14 steps; can reach both   OCCUPATION: Not working  PLOF: Independent with household mobility with device and Independent with community mobility with device  PATIENT GOALS: Good use of my R knee  NEXT MD VISIT: 09/08/23  OBJECTIVE:   PATIENT  SURVEYS:  FOTO: Perceived function   39%, predicted   51%    COGNITION: Overall cognitive status: Within functional limits for tasks assessed     SENSATION: WFL  EDEMA:  Swelling of the R knee  MUSCLE LENGTH: Hamstrings: Right NT deg; Left NT deg Thomas test: Right NT deg; Left NT deg  POSTURE: No Significant postural limitations  PALPATION: TTP of the peri-R knee and thigh  LOWER EXTREMITY ROM:  Active assisted ROM Right eval Left eval Right  Right 09/07/23 Right  09/14/23 Right 09/21/23 Right 09/29/23 Right 10/12/23  Hip flexion          Hip extension          Hip abduction          Hip adduction          Hip internal rotation          Hip external rotation                    Knee flexion 80  98 AA 107 A 108 AA  117   Knee extension 10 lacking    -20 quad lag -20 -20 -15  Ankle dorsiflexion          Ankle plantarflexion          Ankle inversion          Ankle eversion           (Blank rows = not tested)  LOWER EXTREMITY MMT:  MMT Right eval Left eval  Hip flexion 2   Hip extension    Hip abduction 2   Hip adduction    Hip internal rotation 2   Hip external rotation 2   Knee flexion 2   Knee extension 2   Ankle dorsiflexion    Ankle plantarflexion    Ankle inversion    Ankle eversion     (Blank rows = not tested)  LOWER EXTREMITY SPECIAL TESTS:  NT  FUNCTIONAL TESTS:  Complete and 5xSTS when pt is no longer needing an assist device with walking 2 MWT with RW 186 Feet   GAIT: Distance walked: 114ft Assistive device utilized: Environmental consultant - 2 wheeled Level of assistance: Complete Independence Comments: Step to pattern c L LE   TODAY'S TREATMENT:  [facility] Adult PT Treatment:                                                DATE: 10/12/23 Therapeutic Exercise: Nustep L5 LE only x 5 minutes  5# OMEGA Knee extension Single leg x 10 15# OMEGA knee flexion  eccentric RLE SLR with quad lag present (-15) x 10 Supine March  SAQ 4#  Side clam red x  10 Side reverse clam  Side hip abdct x 5 R Prone hip ext x 10R Prone QS x 10     [facility] Adult PT Treatment:                                                DATE: 10/07/23 Therapeutic Exercise: Nustep L8 UE/LE QS x10 5" SLR c QS x10 3", quad lag presnet SAQ 2x10 3", quad lag present S/L hip abd 2x10 STS 2 x 10, airex Standing TKE BluTB 2x10 Therapeutic Activity: Gait training c SPC FOTO reassessed and reviewed  [facility] Adult PT Treatment:                                                DATE: 10/05/23 Therapeutic Exercise: Nustep  STS x 10 SAQ 4# Supine march Right heel slides on slide board Bridge with ball squeeze  Sidelying red band clam  15 Reverse clam x 15 Hip abduction x 10- pillows between knees to elevate Prone h/s curls Prone QS  Prone alternating hp extension Right seated leg press   Therapeutic Activity: 2 MWT 186 feet with RW 5 x STS 15.2 sec  [facility] Adult PT Treatment:                                                DATE: 10/01/23 Therapeutic Exercise: Nustep LE x 5 minutes  TKE blue band in doorway - pt instructed in set up for home Seated LAQ 2x10 4# SLR c QS x15-20 degree quad lag Bridge with ball squeeze  STS 2x10, favors R LE Therapeutic Activity: Gait training with SPC and CGA  [facility] Adult PT Treatment:                                                DATE: 09/29/23 Therapeutic Exercise: Nustep LE x 7 minutes  TKE blue band in doorway - pt instructed in set up for home H/S curl blue band in doorway- pt instructed in set up for Home  Mini squat to chair focusing on weight shift to RLE  Seated LAQ isometric into ball at 90 degrees 5 sec 10 x 2  LAQ red band 10 x 2  R seated leg press 20# x 10 Updated HEP                     PATIENT EDUCATION:  Education details: Eval findings, POC, HEP, self care  Person educated: Patient Education method: Explanation, Demonstration, Tactile cues, Verbal cues, and Handouts Education comprehension: verbalized  understanding, returned demonstration, verbal cues required, tactile cues required, and needs further education  HOME EXERCISE PROGRAM: Access Code: [email or web address] URL: [email or web address] Date: 08/28/2023 Prepared by: Joellyn Rued  Exercises - Supine Quad Set  - 3 x daily - 7 x weekly - 1 sets - 10 reps - 5 hold - Supine Heel Slide with Strap  - 3 x daily - 7 x weekly - 1 sets - 10 reps - 5  hold - Supine Knee Extension Strengthening  - 3 x daily - 7 x weekly - 1 sets - 10 reps - 3 hold - Sit to Stand with Counter Support  - 3 x daily - 7 x weekly - 1 sets - 5 reps - Sitting Knee Extension with Resistance  - 1 x daily - 7 x weekly - 2 sets - 10 reps - Seated Hamstring Curl with Anchored Resistance  - 1 x daily - 7 x weekly - 2 sets - 10 reps - Standing Terminal Knee Extension with Resistance  - 1 x daily - 7 x weekly - 2 sets - 10 reps - 5 hold  ASSESSMENT:  CLINICAL IMPRESSION: Pt demonstrates improved quad lag from -20 to -15 today. Used gym machines to strengthen quad in available rom.  Will consider Russian stim and/or BFR for quad strengthening with future PT sessions. Pt will continue to benefit from skilled PT to address impairments to optimize functional mobility.   EVAL: Patient is a 62 y.o. female who was seen today for physical therapy evaluation and treatment for  R53.1 (ICD-10-CM) - Weakness  Z96.659 (ICD-10-CM) - S/P knee replacement  .Pt presents to PT walking c a RW, WBAT R LE, mod Ind. For 1 week s/p R TKR surgery, the pt's demonstrates good AAROM. Strength is limited with pt not being able to complete a SAQ or SLR. A HEP was initiated to address R knee/LE ROM and strength with pt returning demonstration. Pt will benefit from skilled PT 2w8 to address impairments to optimize function with less pain.   OBJECTIVE IMPAIRMENTS: decreased activity tolerance, decreased mobility, difficulty walking, decreased ROM, decreased strength, increased edema, and pain.    ACTIVITY LIMITATIONS: carrying, lifting, bending, sitting, standing, squatting, sleeping, stairs, transfers, bed mobility, bathing, toileting, dressing, self feeding, hygiene/grooming, locomotion level, and caring for others  PARTICIPATION LIMITATIONS: meal prep, cleaning, laundry, driving, shopping, and community activity  PERSONAL FACTORS: Past/current experiences and Time since onset of injury/illness/exacerbation are also affecting patient's functional outcome.   REHAB POTENTIAL: Excellent  CLINICAL DECISION MAKING: Stable/uncomplicated  EVALUATION COMPLEXITY: Low   GOALS:  SHORT TERM GOALS: Target date: 09/18/23 Pt will be Ind in an initial HEP  Baseline: started Goal status: MET  2.  Increase L knee AROM to 5-100d to improve R knee function Baseline: 10-80d 09/07/23: significant quad lag Goal status: PARTIALLY MET (for flexion)   3.  Pt will be able to complete a SAQ and SLR independently Baseline: unable 09/07/23: unable 09/14/23: can complete with -20 d lag 10/12/23: -15 quad lag Goal status: ONGOING  LONG TERM GOALS: Target date: 10/30/23  Pt will be Ind in a final HEP to maintain achieved LOF Baseline: started Goal status: INITIAL  2.  Increase L knee AROM to 0-115d to for appropriate R knee function Baseline: 10-80d 09/29/23: 20-117 10/01/23: c quad set 3d lacking, with SLR -20d quad lag Goal status: ONGOING  3.  Increased R hip and knee strength to 4/5 or greater for appropriate R knee function Baseline:  Goal status: INITIAL  4.  Pt will be able to walk 819ft Indly and asc/dsc 12 steps c a HR and mod Ind for community mobility  Baseline:  Goal status: INITIAL  5.  Improve 5xSTS by MCID of 5" and by MCID of 24ft as indication of improved functional mobility  Baseline: Complete and 5xSTS when pt is no longer needing an assist device with walking  Goal status: INITIAL  6.  Pt's  FOTO score will improved to the predicted value of 51% as  indication of improved function  Baseline: 39%.  10/07/23=54% Goal status: MET   PLAN:  PT FREQUENCY: 2x/week  PT DURATION: 8 weeks  PLANNED INTERVENTIONS: Therapeutic exercises, Therapeutic activity, Neuromuscular re-education, Balance training, Gait training, Patient/Family education, Self Care, Joint mobilization, Stair training, Aquatic Therapy, Dry Needling, Electrical stimulation, Cryotherapy, Moist heat, Taping, Vasopneumatic device, Ultrasound, Ionotophoresis 4mg /ml Dexamethasone, Manual therapy, and Re-evaluation  PLAN FOR NEXT SESSION: FOTO status; assess response to HEP; progress therex as indicated; use of modalities, manual therapy; and TPDN as indicated. STRENGTHEN HIP    Jannette Spanner, [place] 10/12/23 12:56 PM Phone: [phone] Fax: [phone]    [facility] Authorization   Choose one: Rehabilitative  Standardized Assessment or Functional Outcome Tool: See Pain Assessment and Other FOTO  Score or Percent Disability: 39  Body Parts Treated (Select each separately):  Knee. Overall deficits/functional limitations for body part selected: moderate   If treatment provided at initial evaluation, no treatment charged due to lack of authorization.

## 2023-10-13 ENCOUNTER — Ambulatory Visit (INDEPENDENT_AMBULATORY_CARE_PROVIDER_SITE_OTHER): Payer: Medicaid Other | Admitting: Family

## 2023-10-13 ENCOUNTER — Ambulatory Visit: Payer: Medicaid Other | Admitting: Family

## 2023-10-13 DIAGNOSIS — M1711 Unilateral primary osteoarthritis, right knee: Secondary | ICD-10-CM

## 2023-10-13 DIAGNOSIS — M25871 Other specified joint disorders, right ankle and foot: Secondary | ICD-10-CM | POA: Diagnosis not present

## 2023-10-13 NOTE — Therapy (Signed)
OUTPATIENT PHYSICAL THERAPY LOWER EXTREMITY TREATMENT   Patient Name: Sandra Lozano MRN: [id number] DOB:[birth year], 62 y.o., female Today's Date: 10/14/2023  END OF SESSION:  PT End of Session - 10/14/23 0854     Visit Number 13    Number of Visits 25    Date for PT Re-Evaluation 12/04/23    Authorization Type [place] MEDICAID [facility]    Authorization Time Period 08/31/23-10/30/23    Authorization - Visit Number 12    Authorization - Number of Visits 12    PT Start Time 0845    PT Stop Time 0935    PT Time Calculation (min) 50 min    Activity Tolerance Patient tolerated treatment well    Behavior During Therapy [facility] for tasks assessed/performed                 Past Medical History:  Diagnosis Date   Arthritis    right knee, lower back   Chest wall pain 04/20/2021   Dyspnea    very rare -tx with albuterol neb sol if needed   Hyperlipidemia    Hypertension    Intertrigo 08/06/2022   Pre-diabetes    per patient - does not check blood sugar   Right hip pain 05/05/2018   Rupture of anterior cruciate ligament of right knee 02/15/2018   Sprain of medial collateral ligament of right knee 02/15/2018   SVD (spontaneous vaginal delivery)    x 5 - only 2 living, 1 stillborn and 1 premature at 7 months demise,1 child deceased   Wears dentures    upper and lower   Past Surgical History:  Procedure Laterality Date   FOOT ARTHRODESIS Left 11/02/2020   Procedure: LEFT MIDFOOT FUSION BASE 1ST AND 2ND METATARSAL;  Surgeon: Nadara Mustard, MD;  Location: [facility] OR;  Service: Orthopedics;  Laterality: Left;   LUMBAR FUSION  05/20/2019   GILL PROCEDURE, LEFT TRANSFORAMINAL LUMBAR INTERBODY FUSION, PEDICLE INSTRUMENTATION, BILATERAL FUSION (N/A   TOTAL KNEE ARTHROPLASTY Right 08/21/2023   Procedure: RIGHT TOTAL KNEE ARTHROPLASTY;  Surgeon: Nadara Mustard, MD;  Location: [facility] OR;  Service: Orthopedics;  Laterality: Right;   TUBAL LIGATION     Patient Active Problem List   Diagnosis Date  Noted   Unilateral primary osteoarthritis, right knee 08/21/2023   Total knee replacement status, right 08/21/2023   Hidradenitis suppurativa of right axilla 06/03/2023   Vaginal irritation 02/21/2023   Skin lesion of back 02/21/2023   Tinea 12/18/2022   Left shoulder pain 08/25/2022   DJD (degenerative joint disease), multiple sites 08/06/2022   COPD, mild (HCC) 02/11/2022   Carpal tunnel syndrome, bilateral 12/21/2021   Arthritis of finger of left hand 11/26/2021   Traumatic arthritis of left foot    Lichen simplex chronicus 06/04/2020   Arthritis of midfoot 03/14/2020   History of lumbar fusion 03/14/2020   Well controlled diabetes mellitus (HCC) 11/07/2019   Nummular eczema 07/06/2019   Lumbar stenosis 05/20/2019   Acute left-sided low back pain without sciatica 07/18/2018   Osteoarthritis of right hip 06/03/2018   Chronic right shoulder pain 05/05/2018   Essential hypertension 02/15/2018    PCP: Lockie Mola, MD   REFERRING PROVIDER: Nadara Mustard, MD   REFERRING DIAG:  R53.1 (ICD-10-CM) - Weakness  Z96.659 (ICD-10-CM) - S/P knee replacement    THERAPY DIAG:  Chronic pain of right knee  Muscle weakness (generalized)  Difficulty in walking, not elsewhere classified  Rationale for Evaluation and Treatment: Rehabilitation  ONSET DATE: 08/21/23 surgery  SUBJECTIVE:   SUBJECTIVE STATEMENT: Pt reports her R knee is hurting more today.  PERTINENT HISTORY: HTN  PAIN:  Are you having pain? Yes: NPRS scale: 7/10 Pain location: R knee Pain description: ache, discomfort Aggravating factors: Moving, walking Relieving factors: Ice man, tylenol  PRECAUTIONS: Knee  RED FLAGS: None   WEIGHT BEARING RESTRICTIONS: No WBAT  FALLS:  Has patient fallen in last 6 months? No  LIVING ENVIRONMENT: Lives with: lives with their family Lives in: House/apartment Stairs: Yes: External: 14 steps; can reach both   OCCUPATION: Not working  PLOF: Independent with  household mobility with device and Independent with community mobility with device  PATIENT GOALS: Good use of my R knee  NEXT MD VISIT: 09/08/23  OBJECTIVE:   PATIENT SURVEYS:  FOTO: Perceived function   39%, predicted   51%    COGNITION: Overall cognitive status: Within functional limits for tasks assessed     SENSATION: WFL  EDEMA:  Swelling of the R knee  MUSCLE LENGTH: Hamstrings: Right NT deg; Left NT deg Thomas test: Right NT deg; Left NT deg  POSTURE: No Significant postural limitations  PALPATION: TTP of the peri-R knee and thigh  LOWER EXTREMITY ROM:  Active assisted ROM Right eval Left eval Right  Right 09/07/23 Right  09/14/23 Right 09/21/23 Right 09/29/23 Right 10/12/23  Hip flexion          Hip extension          Hip abduction          Hip adduction          Hip internal rotation          Hip external rotation                    Knee flexion 80  98 AA 107 A 108 AA  117   Knee extension 10 lacking    -20 quad lag -20 -20 -15  Ankle dorsiflexion          Ankle plantarflexion          Ankle inversion          Ankle eversion           (Blank rows = not tested)  LOWER EXTREMITY MMT:  MMT Right eval Left eval RT  Hip flexion 2  3  Hip extension     Hip abduction 2  3  Hip adduction     Hip internal rotation 2    Hip external rotation 2  3  Knee flexion 2  3  Knee extension 2  3-  Ankle dorsiflexion     Ankle plantarflexion     Ankle inversion     Ankle eversion      (Blank rows = not tested)  LOWER EXTREMITY SPECIAL TESTS:  NT  FUNCTIONAL TESTS:  Complete and 5xSTS when pt is no longer needing an assist device with walking 2 MWT with RW 186 Feet   GAIT: Distance walked: 113ft Assistive device utilized: Walker - 2 wheeled Level of assistance: Complete Independence Comments: Step to pattern c L LE   TODAY'S TREATMENT:  [facility] Adult PT Treatment:                                                DATE: 10/14/23 Therapeutic  Exercise: Nustep L5 LE only x  5 minutes  5# OMEGA Knee extension Single leg x 10 15# OMEGA knee flexion  eccentric RLE SLR with quad lag present (-15) x 10 Supine March  SAQ 4# 2x10 Side clam red x 10 Side reverse clam x10 STS x10 Standing hip abd, ext, knee curl x15 each 4# Modalities: Cold pack x10 min R knee  [facility] Adult PT Treatment:                                                DATE: 10/12/23 Therapeutic Exercise: Nustep L5 LE only x 5 minutes  5# OMEGA Knee extension Single leg x 10 15# OMEGA knee flexion  eccentric RLE SLR with quad lag present (-15) x 10 Supine March  SAQ 4#  Side clam red x 10 Side reverse clam  Side hip abdct x 5 R Prone hip ext x 10R Prone QS x 10  [facility] Adult PT Treatment:                                                DATE: 10/07/23 Therapeutic Exercise: Nustep L8 UE/LE QS x10 5" SLR c QS x10 3", quad lag presnet SAQ 2x10 3", quad lag present S/L hip abd 2x10 STS 2 x 10, airex Standing TKE BluTB 2x10 Therapeutic Activity: Gait training c SPC FOTO reassessed and reviewed                     PATIENT EDUCATION:  Education details: Eval findings, POC, HEP, self care  Person educated: Patient Education method: Explanation, Demonstration, Tactile cues, Verbal cues, and Handouts Education comprehension: verbalized understanding, returned demonstration, verbal cues required, tactile cues required, and needs further education  HOME EXERCISE PROGRAM: Access Code: [email or web address] URL: [email or web address] Date: 08/28/2023 Prepared by: Joellyn Rued  Exercises - Supine Quad Set  - 3 x daily - 7 x weekly - 1 sets - 10 reps - 5 hold - Supine Heel Slide with Strap  - 3 x daily - 7 x weekly - 1 sets - 10 reps - 5 hold - Supine Knee Extension Strengthening  - 3 x daily - 7 x weekly - 1 sets - 10 reps - 3 hold - Sit to Stand with Counter Support  - 3 x daily - 7 x weekly - 1 sets - 5 reps - Sitting Knee Extension with Resistance  - 1 x  daily - 7 x weekly - 2 sets - 10 reps - Seated Hamstring Curl with Anchored Resistance  - 1 x daily - 7 x weekly - 2 sets - 10 reps - Standing Terminal Knee Extension with Resistance  - 1 x daily - 7 x weekly - 2 sets - 10 reps - 5 hold  ASSESSMENT:  CLINICAL IMPRESSION: Pt is making progress re: R knee AROM, strength, and function. Regaining quad strength has been slower than anticipated, but pt is beginning to show improved strength as per her quad lag measure. Functionally, pt is continuing to need a RW for an assist device due to her R quad weakness, but she is finding her function with daily activities is improving as per her FOTO functional ability score. Pt will continue to benefit from skilled PT 2w6 to address impairments  for improved R knee/LE function progressing pt's walking to a SPC as her R knee/leg strength improves. Will utilize BFR and or [place] stim to assit with strengthening as indicated.    EVAL: Patient is a 62 y.o. female who was seen today for physical therapy evaluation and treatment for  R53.1 (ICD-10-CM) - Weakness  Z96.659 (ICD-10-CM) - S/P knee replacement  .Pt presents to PT walking c a RW, WBAT R LE, mod Ind. For 1 week s/p R TKR surgery, the pt's demonstrates good AAROM. Strength is limited with pt not being able to complete a SAQ or SLR. A HEP was initiated to address R knee/LE ROM and strength with pt returning demonstration. Pt will benefit from skilled PT 2w8 to address impairments to optimize function with less pain.   OBJECTIVE IMPAIRMENTS: decreased activity tolerance, decreased mobility, difficulty walking, decreased ROM, decreased strength, increased edema, and pain.   ACTIVITY LIMITATIONS: carrying, lifting, bending, sitting, standing, squatting, sleeping, stairs, transfers, bed mobility, bathing, toileting, dressing, self feeding, hygiene/grooming, locomotion level, and caring for others  PARTICIPATION LIMITATIONS: meal prep, cleaning, laundry, driving,  shopping, and community activity  PERSONAL FACTORS: Past/current experiences and Time since onset of injury/illness/exacerbation are also affecting patient's functional outcome.   REHAB POTENTIAL: Excellent  CLINICAL DECISION MAKING: Stable/uncomplicated  EVALUATION COMPLEXITY: Low   GOALS:  SHORT TERM GOALS: Target date: 09/18/23 Pt will be Ind in an initial HEP  Baseline: started Goal status: MET  2.  Increase L knee AROM to 5-100d to improve R knee function Baseline: 10-80d 09/07/23: significant quad lag Goal status: PARTIALLY MET (for flexion)   3.  Pt will be able to complete a SAQ and SLR independently Baseline: unable 09/07/23: unable 09/14/23: can complete with -20 d lag 10/12/23: -15 quad lag Goal status: ONGOING  LONG TERM GOALS: Target date: 12/04/23  Pt will be Ind in a final HEP to maintain achieved LOF Baseline: started Goal status: INITIAL  2.  Increase L knee AROM to 0-115d to for appropriate R knee function Baseline: 10-80d 09/29/23: 20-117 10/01/23: c quad set 3d lacking, with SLR -20d quad lag 10/12/23: SLR -15d quad lag Goal status: ONGOING  3.  Increased R hip and knee strength to 4/5 or greater for appropriate R knee function Baseline: See flow sheets Goal status: IMPROVED  4.  Pt will be able to walk 892ft Indly and asc/dsc 12 steps c a HR and mod Ind for community mobility  Baseline:  Goal status: ONGOING  5.  Improve 5xSTS by MCID of 5" and by MCID of 59ft as indication of improved functional mobility  Baseline: Complete and 5xSTS when pt is no longer needing an assist device with walking 10/05/23: 2 MWT 186 feet with RW; 5 x STS 15.2 sec Goal status: ONGOING  6.  Pt's FOTO score will improved to the predicted value of 51% as indication of improved function  Baseline: 39%.  10/07/23=54% Goal status: MET   PLAN:  PT FREQUENCY: 2x/week  PT DURATION: 8 weeks  PLANNED INTERVENTIONS: Therapeutic exercises, Therapeutic  activity, Neuromuscular re-education, Balance training, Gait training, Patient/Family education, Self Care, Joint mobilization, Stair training, Aquatic Therapy, Dry Needling, Electrical stimulation, Cryotherapy, Moist heat, Taping, Vasopneumatic device, Ultrasound, Ionotophoresis 4mg /ml Dexamethasone, Manual therapy, and Re-evaluation  PLAN FOR NEXT SESSION: FOTO status; assess response to HEP; progress therex as indicated; use of modalities, manual therapy; and TPDN as indicated. STRENGTHEN HIP   Joellyn Rued MS, PT 10/14/23 10:54 AM

## 2023-10-14 ENCOUNTER — Ambulatory Visit: Payer: Medicaid Other

## 2023-10-14 DIAGNOSIS — M6281 Muscle weakness (generalized): Secondary | ICD-10-CM | POA: Diagnosis not present

## 2023-10-14 DIAGNOSIS — G8929 Other chronic pain: Secondary | ICD-10-CM | POA: Diagnosis not present

## 2023-10-14 DIAGNOSIS — R262 Difficulty in walking, not elsewhere classified: Secondary | ICD-10-CM

## 2023-10-14 DIAGNOSIS — M25561 Pain in right knee: Secondary | ICD-10-CM | POA: Diagnosis not present

## 2023-10-16 ENCOUNTER — Encounter: Payer: Self-pay | Admitting: Family

## 2023-10-16 DIAGNOSIS — Z419 Encounter for procedure for purposes other than remedying health state, unspecified: Secondary | ICD-10-CM | POA: Diagnosis not present

## 2023-10-16 MED ORDER — LIDOCAINE HCL 1 % IJ SOLN
2.0000 mL | INTRAMUSCULAR | Status: AC | PRN
Start: 2023-10-13 — End: 2023-10-13
  Administered 2023-10-13: 2 mL

## 2023-10-16 MED ORDER — METHYLPREDNISOLONE ACETATE 40 MG/ML IJ SUSP
40.0000 mg | INTRAMUSCULAR | Status: AC | PRN
Start: 2023-10-13 — End: 2023-10-13
  Administered 2023-10-13: 40 mg via INTRA_ARTICULAR

## 2023-10-19 ENCOUNTER — Encounter: Payer: Self-pay | Admitting: Podiatry

## 2023-10-19 ENCOUNTER — Ambulatory Visit (INDEPENDENT_AMBULATORY_CARE_PROVIDER_SITE_OTHER): Payer: Medicaid Other

## 2023-10-19 ENCOUNTER — Ambulatory Visit (INDEPENDENT_AMBULATORY_CARE_PROVIDER_SITE_OTHER): Payer: Medicaid Other | Admitting: Podiatry

## 2023-10-19 ENCOUNTER — Ambulatory Visit: Payer: Medicaid Other | Admitting: Physical Therapy

## 2023-10-19 ENCOUNTER — Other Ambulatory Visit: Payer: Self-pay | Admitting: Family Medicine

## 2023-10-19 DIAGNOSIS — M7752 Other enthesopathy of left foot: Secondary | ICD-10-CM

## 2023-10-19 DIAGNOSIS — M7751 Other enthesopathy of right foot: Secondary | ICD-10-CM

## 2023-10-19 DIAGNOSIS — M778 Other enthesopathies, not elsewhere classified: Secondary | ICD-10-CM

## 2023-10-19 DIAGNOSIS — Z0000 Encounter for general adult medical examination without abnormal findings: Secondary | ICD-10-CM

## 2023-10-20 NOTE — Therapy (Signed)
OUTPATIENT PHYSICAL THERAPY LOWER EXTREMITY TREATMENT   Patient Name: Sandra Lozano MRN: [id number] DOB:[birth year], 62 y.o., female Today's Date: 10/21/2023  END OF SESSION:  PT End of Session - 10/21/23 0907     Visit Number 14    Number of Visits 25    Date for PT Re-Evaluation 12/04/23    Authorization Type [place] MEDICA[employer] [facility]    Authorization Time Period Approved 12 visits 10/14/23-12/16/23    Authorization - Visit Number 1    Authorization - Number of Visits 12    PT Start Time [phone]    PT Stop Time 0930    PT Time Calculation (min) 40 min    Activity Tolerance Patient tolerated treatment well    Behavior During Therapy [facility] for tasks assessed/performed                  Past Medical History:  Diagnosis Date   Arthritis    right knee, lower back   Chest wall pain 04/20/2021   Dyspnea    very rare -tx with albuterol neb sol if needed   Hyperlipidemia    Hypertension    Intertrigo 08/06/2022   Pre-diabetes    per patient - does not check blood sugar   Right hip pain 05/05/2018   Rupture of anterior cruciate ligament of right knee 02/15/2018   Sprain of medial collateral ligament of right knee 02/15/2018   SVD (spontaneous vaginal delivery)    x 5 - only 2 living, 1 stillborn and 1 premature at 7 months demise,1 child deceased   Wears dentures    upper and lower   Past Surgical History:  Procedure Laterality Date   FOOT ARTHRODESIS Left 11/02/2020   Procedure: LEFT MIDFOOT FUSION BASE 1ST AND 2ND METATARSAL;  Surgeon: Nadara Mustard, MD;  Location: [facility] OR;  Service: Orthopedics;  Laterality: Left;   LUMBAR FUSION  05/20/2019   GILL PROCEDURE, LEFT TRANSFORAMINAL LUMBAR INTERBODY FUSION, PEDICLE INSTRUMENTATION, BILATERAL FUSION (N/A   TOTAL KNEE ARTHROPLASTY Right 08/21/2023   Procedure: RIGHT TOTAL KNEE ARTHROPLASTY;  Surgeon: Nadara Mustard, MD;  Location: [facility] OR;  Service: Orthopedics;  Laterality: Right;   TUBAL LIGATION     Patient Active Problem List    Diagnosis Date Noted   Unilateral primary osteoarthritis, right knee 08/21/2023   Total knee replacement status, right 08/21/2023   Hidradenitis suppurativa of right axilla 06/03/2023   Vaginal irritation 02/21/2023   Skin lesion of back 02/21/2023   Tinea 12/18/2022   Left shoulder pain 08/25/2022   DJD (degenerative joint disease), multiple sites 08/06/2022   COPD, mild (HCC) 02/11/2022   Carpal tunnel syndrome, bilateral 12/21/2021   Arthritis of finger of left hand 11/26/2021   Traumatic arthritis of left foot    Lichen simplex chronicus 06/04/2020   Arthritis of midfoot 03/14/2020   History of lumbar fusion 03/14/2020   Well controlled diabetes mellitus (HCC) 11/07/2019   Nummular eczema 07/06/2019   Lumbar stenosis 05/20/2019   Acute left-sided low back pain without sciatica 07/18/2018   Osteoarthritis of right hip 06/03/2018   Chronic right shoulder pain 05/05/2018   Essential hypertension 02/15/2018    PCP: Lockie Mola, MD   REFERRING PROVIDER: Nadara Mustard, MD   REFERRING DIAG:  R53.1 (ICD-10-CM) - Weakness  Z96.659 (ICD-10-CM) - S/P knee replacement    THERAPY DIAG:  Chronic pain of right knee  Muscle weakness (generalized)  Difficulty in walking, not elsewhere classified  Rationale for Evaluation and Treatment: Rehabilitation  ONSET  DATE: 08/21/23 surgery  SUBJECTIVE:   SUBJECTIVE STATEMENT: Pt reports she has started driving.  PERTINENT HISTORY: HTN  PAIN:  Are you having pain? Yes: NPRS scale: 1/10 Pain location: R knee Pain description: ache, discomfort Aggravating factors: Moving, walking Relieving factors: Ice man, tylenol  PRECAUTIONS: Knee  RED FLAGS: None   WEIGHT BEARING RESTRICTIONS: No WBAT  FALLS:  Has patient fallen in last 6 months? No  LIVING ENVIRONMENT: Lives with: lives with their family Lives in: House/apartment Stairs: Yes: External: 14 steps; can reach both   OCCUPATION: Not working  PLOF:  Independent with household mobility with device and Independent with community mobility with device  PATIENT GOALS: Good use of my R knee  NEXT MD VISIT: 09/08/23  OBJECTIVE:   PATIENT SURVEYS:  FOTO: Perceived function   39%, predicted   51%    COGNITION: Overall cognitive status: Within functional limits for tasks assessed     SENSATION: WFL  EDEMA:  Swelling of the R knee  MUSCLE LENGTH: Hamstrings: Right NT deg; Left NT deg Thomas test: Right NT deg; Left NT deg  POSTURE: No Significant postural limitations  PALPATION: TTP of the peri-R knee and thigh  LOWER EXTREMITY ROM:  Active assisted ROM Right eval Left eval Right  Right 09/07/23 Right  09/14/23 Right 09/21/23 Right 09/29/23 Right 10/12/23  Hip flexion          Hip extension          Hip abduction          Hip adduction          Hip internal rotation          Hip external rotation                    Knee flexion 80  98 AA 107 A 108 AA  117   Knee extension 10 lacking    -20 quad lag -20 -20 -15  Ankle dorsiflexion          Ankle plantarflexion          Ankle inversion          Ankle eversion           (Blank rows = not tested)  LOWER EXTREMITY MMT:  MMT Right eval Left eval RT  Hip flexion 2  3  Hip extension     Hip abduction 2  3  Hip adduction     Hip internal rotation 2    Hip external rotation 2  3  Knee flexion 2  3  Knee extension 2  3-  Ankle dorsiflexion     Ankle plantarflexion     Ankle inversion     Ankle eversion      (Blank rows = not tested)  LOWER EXTREMITY SPECIAL TESTS:  NT  FUNCTIONAL TESTS:  Complete and 5xSTS when pt is no longer needing an assist device with walking 2 MWT with RW 186 Feet   GAIT: Distance walked: 116ft Assistive device utilized: Walker - 2 wheeled Level of assistance: Complete Independence Comments: Step to pattern c L LE   TODAY'S TREATMENT:  [facility] Adult PT Treatment:                                                DATE:  10/21/23 Therapeutic Exercise: Nustep L5 LE only  x 8 minutes  LAQ 5# 2x10 SLR with quad lag present (-15) x 10 Wall squat slides to 60d 2x10 Lateral side step ups 2x10, veral cue for control STS x10 Standing hip abd, ext, knee curl x15 each 4x Standing SLR 2x10  [facility] Adult PT Treatment:                                                DATE: 10/14/23 Therapeutic Exercise: Nustep L5 LE only x 5 minutes  5# OMEGA Knee extension Single leg x 10 15# OMEGA knee flexion  eccentric RLE SLR with quad lag present (-15) x 10 Supine March  SAQ 4# 2x10 Side clam red x 10 Side reverse clam x10 STS x10 Standing hip abd, ext, knee curl x15 each 4# Modalities: Cold pack x10 min R knee  [facility] Adult PT Treatment:                                                DATE: 10/12/23 Therapeutic Exercise: Nustep L5 LE only x 5 minutes  5# OMEGA Knee extension Single leg x 10 15# OMEGA knee flexion  eccentric RLE SLR with quad lag present (-15) x 10 Supine March  SAQ 4#  Side clam red x 10 Side reverse clam  Side hip abdct x 5 R Prone hip ext x 10R Prone QS x 10                     PATIENT EDUCATION:  Education details: Eval findings, POC, HEP, self care  Person educated: Patient Education method: Explanation, Demonstration, Tactile cues, Verbal cues, and Handouts Education comprehension: verbalized understanding, returned demonstration, verbal cues required, tactile cues required, and needs further education  HOME EXERCISE PROGRAM: Access Code: [email or web address] URL: [email or web address] Date: 08/28/2023 Prepared by: Joellyn Rued  Exercises - Supine Quad Set  - 3 x daily - 7 x weekly - 1 sets - 10 reps - 5 hold - Supine Heel Slide with Strap  - 3 x daily - 7 x weekly - 1 sets - 10 reps - 5 hold - Supine Knee Extension Strengthening  - 3 x daily - 7 x weekly - 1 sets - 10 reps - 3 hold - Sit to Stand with Counter Support  - 3 x daily - 7 x weekly - 1 sets - 5 reps - Sitting Knee Extension  with Resistance  - 1 x daily - 7 x weekly - 2 sets - 10 reps - Seated Hamstring Curl with Anchored Resistance  - 1 x daily - 7 x weekly - 2 sets - 10 reps - Standing Terminal Knee Extension with Resistance  - 1 x daily - 7 x weekly - 2 sets - 10 reps - 5 hold  ASSESSMENT:  CLINICAL IMPRESSION: PT focused on R quad/LE strengthening. Quad lag is still present. Pt was able to perform SLR in standing, with reduced effect from gravity, demonstrating better quad control in this position. Recommended pt bring shorts to her next PT appt so the option of [place] stim can attempted to address R quad strengthening. Pt tolerated PT today without adverse effects. Pt will continue to benefit from skilled PT to address impairments for improved functional mobility  Pt is making progress re: R knee AROM, strength, and function. Regaining quad strength has been slower than anticipated, but pt is beginning to show improved strength as per her quad lag measure. Functionally, pt is continuing to need a RW for an assist device due to her R quad weakness, but she is finding her function with daily activities is improving as per her FOTO functional ability score. Pt will continue to benefit from skilled PT 2w6 to address impairments for improved R knee/LE function progressing pt's walking to a SPC as her R knee/leg strength improves. Will utilize BFR and or [place] stim to assist with strengthening as indicated.    EVAL: Patient is a 61 y.o. female who was seen today for physical therapy evaluation and treatment for  R53.1 (ICD-10-CM) - Weakness  Z96.659 (ICD-10-CM) - S/P knee replacement  .Pt presents to PT walking c a RW, WBAT R LE, mod Ind. For 1 week s/p R TKR surgery, the pt's demonstrates good AAROM. Strength is limited with pt not being able to complete a SAQ or SLR. A HEP was initiated to address R knee/LE ROM and strength with pt returning demonstration. Pt will benefit from skilled PT 2w8 to address impairments to  optimize function with less pain.   OBJECTIVE IMPAIRMENTS: decreased activity tolerance, decreased mobility, difficulty walking, decreased ROM, decreased strength, increased edema, and pain.   ACTIVITY LIMITATIONS: carrying, lifting, bending, sitting, standing, squatting, sleeping, stairs, transfers, bed mobility, bathing, toileting, dressing, self feeding, hygiene/grooming, locomotion level, and caring for others  PARTICIPATION LIMITATIONS: meal prep, cleaning, laundry, driving, shopping, and community activity  PERSONAL FACTORS: Past/current experiences and Time since onset of injury/illness/exacerbation are also affecting patient's functional outcome.   REHAB POTENTIAL: Excellent  CLINICAL DECISION MAKING: Stable/uncomplicated  EVALUATION COMPLEXITY: Low   GOALS:  SHORT TERM GOALS: Target date: 09/18/23 Pt will be Ind in an initial HEP  Baseline: started Goal status: MET  2.  Increase L knee AROM to 5-100d to improve R knee function Baseline: 10-80d 09/07/23: significant quad lag Goal status: PARTIALLY MET (for flexion)   3.  Pt will be able to complete a SAQ and SLR independently Baseline: unable 09/07/23: unable 09/14/23: can complete with -20 d lag 10/12/23: -15 quad lag Goal status: ONGOING  LONG TERM GOALS: Target date: 12/04/23  Pt will be Ind in a final HEP to maintain achieved LOF Baseline: started Goal status: INITIAL  2.  Increase L knee AROM to 0-115d to for appropriate R knee function Baseline: 10-80d 09/29/23: 20-117 10/01/23: c quad set 3d lacking, with SLR -20d quad lag 10/12/23: SLR -15d quad lag Goal status: ONGOING  3.  Increased R hip and knee strength to 4/5 or greater for appropriate R knee function Baseline: See flow sheets Goal status: IMPROVED  4.  Pt will be able to walk 812ft Indly and asc/dsc 12 steps c a HR and mod Ind for community mobility  Baseline:  Goal status: ONGOING  5.  Improve 5xSTS by MCID of 5" and by MCID of 52ft as  indication of improved functional mobility  Baseline: Complete and 5xSTS when pt is no longer needing an assist device with walking 10/05/23: 2 MWT 186 feet with RW; 5 x STS 15.2 sec Goal status: ONGOING  6.  Pt's FOTO score will improved to the predicted value of 51% as indication of improved function  Baseline: 39%.  10/07/23=54% Goal status: MET   PLAN:  PT FREQUENCY: 2x/week  PT DURATION: 8 weeks  PLANNED INTERVENTIONS: Therapeutic exercises, Therapeutic activity, Neuromuscular re-education, Balance training, Gait training, Patient/Family education, Self Care, Joint mobilization, Stair training, Aquatic Therapy, Dry Needling, Electrical stimulation, Cryotherapy, Moist heat, Taping, Vasopneumatic device, Ultrasound, Ionotophoresis 4mg /ml Dexamethasone, Manual therapy, and Re-evaluation  PLAN FOR NEXT SESSION: FOTO status; assess response to HEP; progress therex as indicated; use of modalities, manual therapy; and TPDN as indicated. STRENGTHEN HIP   Joellyn Rued MS, PT 10/21/23 12:03 PM

## 2023-10-21 ENCOUNTER — Ambulatory Visit: Payer: Medicaid Other | Attending: Orthopedic Surgery

## 2023-10-21 DIAGNOSIS — R262 Difficulty in walking, not elsewhere classified: Secondary | ICD-10-CM | POA: Insufficient documentation

## 2023-10-21 DIAGNOSIS — G8929 Other chronic pain: Secondary | ICD-10-CM | POA: Diagnosis not present

## 2023-10-21 DIAGNOSIS — M25561 Pain in right knee: Secondary | ICD-10-CM | POA: Insufficient documentation

## 2023-10-21 DIAGNOSIS — M6281 Muscle weakness (generalized): Secondary | ICD-10-CM | POA: Insufficient documentation

## 2023-10-23 ENCOUNTER — Encounter: Payer: Self-pay | Admitting: Physical Therapy

## 2023-10-23 ENCOUNTER — Ambulatory Visit: Payer: Medicaid Other | Admitting: Physical Therapy

## 2023-10-23 DIAGNOSIS — M6281 Muscle weakness (generalized): Secondary | ICD-10-CM | POA: Diagnosis not present

## 2023-10-23 DIAGNOSIS — M25561 Pain in right knee: Secondary | ICD-10-CM | POA: Diagnosis not present

## 2023-10-23 DIAGNOSIS — G8929 Other chronic pain: Secondary | ICD-10-CM | POA: Diagnosis not present

## 2023-10-23 DIAGNOSIS — R262 Difficulty in walking, not elsewhere classified: Secondary | ICD-10-CM | POA: Diagnosis not present

## 2023-10-23 NOTE — Therapy (Signed)
OUTPATIENT PHYSICAL THERAPY LOWER EXTREMITY TREATMENT   Patient Name: Sandra Lozano MRN: [id number] DOB:1961/05/03, 62 y.o., female Today's Date: 10/23/2023  END OF SESSION:  PT End of Session - 10/23/23 1122     Visit Number 15    Number of Visits 25    Date for PT Re-Evaluation 12/04/23    Authorization Type [place] MEDICAID [facility]    Authorization Time Period Approved 12 visits 10/14/23-12/16/23    Authorization - Visit Number 2    Authorization - Number of Visits 12    PT Start Time 1100    PT Stop Time 1145    PT Time Calculation (min) 45 min                  Past Medical History:  Diagnosis Date   Arthritis    right knee, lower back   Chest wall pain 04/20/2021   Dyspnea    very rare -tx with albuterol neb sol if needed   Hyperlipidemia    Hypertension    Intertrigo 08/06/2022   Pre-diabetes    per patient - does not check blood sugar   Right hip pain 05/05/2018   Rupture of anterior cruciate ligament of right knee 02/15/2018   Sprain of medial collateral ligament of right knee 02/15/2018   SVD (spontaneous vaginal delivery)    x 5 - only 2 living, 1 stillborn and 1 premature at 7 months demise,1 child deceased   Wears dentures    upper and lower   Past Surgical History:  Procedure Laterality Date   FOOT ARTHRODESIS Left 11/02/2020   Procedure: LEFT MIDFOOT FUSION BASE 1ST AND 2ND METATARSAL;  Surgeon: Nadara Mustard, MD;  Location: [facility] OR;  Service: Orthopedics;  Laterality: Left;   LUMBAR FUSION  05/20/2019   GILL PROCEDURE, LEFT TRANSFORAMINAL LUMBAR INTERBODY FUSION, PEDICLE INSTRUMENTATION, BILATERAL FUSION (N/A   TOTAL KNEE ARTHROPLASTY Right 08/21/2023   Procedure: RIGHT TOTAL KNEE ARTHROPLASTY;  Surgeon: Nadara Mustard, MD;  Location: [facility] OR;  Service: Orthopedics;  Laterality: Right;   TUBAL LIGATION     Patient Active Problem List   Diagnosis Date Noted   Unilateral primary osteoarthritis, right knee 08/21/2023   Total knee replacement status,  right 08/21/2023   Hidradenitis suppurativa of right axilla 06/03/2023   Vaginal irritation 02/21/2023   Skin lesion of back 02/21/2023   Tinea 12/18/2022   Left shoulder pain 08/25/2022   DJD (degenerative joint disease), multiple sites 08/06/2022   COPD, mild (HCC) 02/11/2022   Carpal tunnel syndrome, bilateral 12/21/2021   Arthritis of finger of left hand 11/26/2021   Traumatic arthritis of left foot    Lichen simplex chronicus 06/04/2020   Arthritis of midfoot 03/14/2020   History of lumbar fusion 03/14/2020   Well controlled diabetes mellitus (HCC) 11/07/2019   Nummular eczema 07/06/2019   Lumbar stenosis 05/20/2019   Acute left-sided low back pain without sciatica 07/18/2018   Osteoarthritis of right hip 06/03/2018   Chronic right shoulder pain 05/05/2018   Essential hypertension 02/15/2018    PCP: Lockie Mola, MD   REFERRING PROVIDER: Nadara Mustard, MD   REFERRING DIAG:  R53.1 (ICD-10-CM) - Weakness  Z96.659 (ICD-10-CM) - S/P knee replacement    THERAPY DIAG:  Chronic pain of right knee  Muscle weakness (generalized)  Rationale for Evaluation and Treatment: Rehabilitation  ONSET DATE: 08/21/23 surgery  SUBJECTIVE:   SUBJECTIVE STATEMENT: Pt reports she has started driving.  PERTINENT HISTORY: HTN  PAIN:  Are you having pain?  Yes: NPRS scale: 1/10 Pain location: R knee Pain description: ache, discomfort Aggravating factors: Moving, walking Relieving factors: Ice man, tylenol  PRECAUTIONS: Knee  RED FLAGS: None   WEIGHT BEARING RESTRICTIONS: No WBAT  FALLS:  Has patient fallen in last 6 months? No  LIVING ENVIRONMENT: Lives with: lives with their family Lives in: House/apartment Stairs: Yes: External: 14 steps; can reach both   OCCUPATION: Not working  PLOF: Independent with household mobility with device and Independent with community mobility with device  PATIENT GOALS: Good use of my R knee  NEXT MD VISIT: 09/08/23  OBJECTIVE:    PATIENT SURVEYS:  FOTO: Perceived function   39%, predicted   51%    COGNITION: Overall cognitive status: Within functional limits for tasks assessed     SENSATION: WFL  EDEMA:  Swelling of the R knee  MUSCLE LENGTH: Hamstrings: Right NT deg; Left NT deg Maisie Fus test: Right NT deg; Left NT deg  POSTURE: No Significant postural limitations  PALPATION: TTP of the peri-R knee and thigh  LOWER EXTREMITY ROM:  Active assisted ROM Right eval Left eval Right  Right 09/07/23 Right  09/14/23 Right 09/21/23 Right 09/29/23 Right 10/12/23 10/23/23 RT  Hip flexion           Hip extension           Hip abduction           Hip adduction           Hip internal rotation           Hip external rotation                      Knee flexion 80  98 AA 107 A 108 AA  117  120 A  Knee extension 10 lacking    -20 quad lag -20 -20 -15 -12  Ankle dorsiflexion           Ankle plantarflexion           Ankle inversion           Ankle eversion            (Blank rows = not tested)  LOWER EXTREMITY MMT:  MMT Right eval Left eval RT  Hip flexion 2  3  Hip extension     Hip abduction 2  3  Hip adduction     Hip internal rotation 2    Hip external rotation 2  3  Knee flexion 2  3  Knee extension 2  3-  Ankle dorsiflexion     Ankle plantarflexion     Ankle inversion     Ankle eversion      (Blank rows = not tested)  LOWER EXTREMITY SPECIAL TESTS:  NT  FUNCTIONAL TESTS:  Complete and 5xSTS when pt is no longer needing an assist device with walking 2 MWT with RW 186 Feet   GAIT: Distance walked: 19ft Assistive device utilized: Environmental consultant - 2 wheeled Level of assistance: Complete Independence Comments: Step to pattern c L LE   TODAY'S TREATMENT:  [facility] Adult PT Treatment:                                                DATE: 10/23/23 Therapeutic Exercise: Nustep L6 LE only x 10 minutes for knee extension strengthening Right hip flexor stretch  with strap   Neuromuscular  re-ed: E stim attended: NMES 30.5 mA x 20 minutes including set up  , right quad, 10 sec on/ 10 sec off concurrent with:  SAQ x 5 min SLR x 5 min LAQ x 5 min     [facility] Adult PT Treatment:                                                DATE: 10/21/23 Therapeutic Exercise: Nustep L5 LE only x 8 minutes  LAQ 5# 2x10 SLR with quad lag present (-15) x 10 Wall squat slides to 60d 2x10 Lateral side step ups 2x10, veral cue for control STS x10 Standing hip abd, ext, knee curl x15 each 4x Standing SLR 2x10  [facility] Adult PT Treatment:                                                DATE: 10/14/23 Therapeutic Exercise: Nustep L5 LE only x 5 minutes  5# OMEGA Knee extension Single leg x 10 15# OMEGA knee flexion  eccentric RLE SLR with quad lag present (-15) x 10 Supine March  SAQ 4# 2x10 Side clam red x 10 Side reverse clam x10 STS x10 Standing hip abd, ext, knee curl x15 each 4# Modalities: Cold pack x10 min R knee  [facility] Adult PT Treatment:                                                DATE: 10/12/23 Therapeutic Exercise: Nustep L5 LE only x 5 minutes  5# OMEGA Knee extension Single leg x 10 15# OMEGA knee flexion  eccentric RLE SLR with quad lag present (-15) x 10 Supine March  SAQ 4#  Side clam red x 10 Side reverse clam  Side hip abdct x 5 R Prone hip ext x 10R Prone QS x 10                     PATIENT EDUCATION:  Education details: Eval findings, POC, HEP, self care  Person educated: Patient Education method: Explanation, Demonstration, Tactile cues, Verbal cues, and Handouts Education comprehension: verbalized understanding, returned demonstration, verbal cues required, tactile cues required, and needs further education  HOME EXERCISE PROGRAM: Access Code: [email or web address] URL: [email or web address] Date: 08/28/2023 Prepared by: Joellyn Rued  Exercises - Supine Quad Set  - 3 x daily - 7 x weekly - 1 sets - 10 reps - 5 hold - Supine Heel Slide with Strap  - 3  x daily - 7 x weekly - 1 sets - 10 reps - 5 hold - Supine Knee Extension Strengthening  - 3 x daily - 7 x weekly - 1 sets - 10 reps - 3 hold - Sit to Stand with Counter Support  - 3 x daily - 7 x weekly - 1 sets - 5 reps - Sitting Knee Extension with Resistance  - 1 x daily - 7 x weekly - 2 sets - 10 reps - Seated Hamstring Curl with Anchored Resistance  - 1 x daily - 7 x weekly - 2 sets - 10  reps - Standing Terminal Knee Extension with Resistance  - 1 x daily - 7 x weekly - 2 sets - 10 reps - 5 hold  ASSESSMENT:  CLINICAL IMPRESSION: PT focused on R quad/LE strengthening. Quad lag is still present. Introduced [employer] Estim to re-educate quad contraction to decrease quad Lag. -12 quad leg with SLR during NMES. Improved knee flexion AROM to 120.  Pt tolerated PT today without adverse effects. Pt will continue to benefit from skilled PT to address impairments for improved functional mobility. Will assess response to Estim and consider repeating next session.   RE-EVAL: Pt is making progress re: R knee AROM, strength, and function. Regaining quad strength has been slower than anticipated, but pt is beginning to show improved strength as per her quad lag measure. Functionally, pt is continuing to need a RW for an assist device due to her R quad weakness, but she is finding her function with daily activities is improving as per her FOTO functional ability score. Pt will continue to benefit from skilled PT 2w6 to address impairments for improved R knee/LE function progressing pt's walking to a SPC as her R knee/leg strength improves. Will utilize BFR and or [place] stim to assist with strengthening as indicated.    EVAL: Patient is a 62 y.o. female who was seen today for physical therapy evaluation and treatment for  R53.1 (ICD-10-CM) - Weakness  Z96.659 (ICD-10-CM) - S/P knee replacement  .Pt presents to PT walking c a RW, WBAT R LE, mod Ind. For 1 week s/p R TKR surgery, the pt's demonstrates good AAROM.  Strength is limited with pt not being able to complete a SAQ or SLR. A HEP was initiated to address R knee/LE ROM and strength with pt returning demonstration. Pt will benefit from skilled PT 2w8 to address impairments to optimize function with less pain.   OBJECTIVE IMPAIRMENTS: decreased activity tolerance, decreased mobility, difficulty walking, decreased ROM, decreased strength, increased edema, and pain.   ACTIVITY LIMITATIONS: carrying, lifting, bending, sitting, standing, squatting, sleeping, stairs, transfers, bed mobility, bathing, toileting, dressing, self feeding, hygiene/grooming, locomotion level, and caring for others  PARTICIPATION LIMITATIONS: meal prep, cleaning, laundry, driving, shopping, and community activity  PERSONAL FACTORS: Past/current experiences and Time since onset of injury/illness/exacerbation are also affecting patient's functional outcome.   REHAB POTENTIAL: Excellent  CLINICAL DECISION MAKING: Stable/uncomplicated  EVALUATION COMPLEXITY: Low   GOALS:  SHORT TERM GOALS: Target date: 09/18/23 Pt will be Ind in an initial HEP  Baseline: started Goal status: MET  2.  Increase L knee AROM to 5-100d to improve R knee function Baseline: 10-80d 09/07/23: significant quad lag Goal status: PARTIALLY MET (for flexion)   3.  Pt will be able to complete a SAQ and SLR independently Baseline: unable 09/07/23: unable 09/14/23: can complete with -20 d lag 10/12/23: -15 quad lag Goal status: ONGOING  LONG TERM GOALS: Target date: 12/04/23  Pt will be Ind in a final HEP to maintain achieved LOF Baseline: started Goal status: INITIAL  2.  Increase L knee AROM to 0-115d to for appropriate R knee function Baseline: 10-80d 09/29/23: 20-117 10/01/23: c quad set 3d lacking, with SLR -20d quad lag 10/12/23: SLR -15d quad lag Goal status: ONGOING  3.  Increased R hip and knee strength to 4/5 or greater for appropriate R knee function Baseline: See flow  sheets Goal status: IMPROVED  4.  Pt will be able to walk 88ft Indly and asc/dsc 12 steps c a HR and mod Ind  for community mobility  Baseline:  Goal status: ONGOING  5.  Improve 5xSTS by MCID of 5" and by MCID of 52ft as indication of improved functional mobility  Baseline: Complete and 5xSTS when pt is no longer needing an assist device with walking 10/05/23: 2 MWT 186 feet with RW; 5 x STS 15.2 sec Goal status: ONGOING  6.  Pt's FOTO score will improved to the predicted value of 51% as indication of improved function  Baseline: 39%.  10/07/23=54% Goal status: MET   PLAN:  PT FREQUENCY: 2x/week  PT DURATION: 8 weeks  PLANNED INTERVENTIONS: Therapeutic exercises, Therapeutic activity, Neuromuscular re-education, Balance training, Gait training, Patient/Family education, Self Care, Joint mobilization, Stair training, Aquatic Therapy, Dry Needling, Electrical stimulation, Cryotherapy, Moist heat, Taping, Vasopneumatic device, Ultrasound, Ionotophoresis 4mg /ml Dexamethasone, Manual therapy, and Re-evaluation  PLAN FOR NEXT SESSION: FOTO status; assess response to HEP; progress therex as indicated; use of modalities, manual therapy; and TPDN as indicated. STRENGTHEN HIP   Jannette Spanner, PTA 10/23/23 11:46 AM Phone: [phone] Fax: [phone]

## 2023-10-26 ENCOUNTER — Other Ambulatory Visit: Payer: Self-pay

## 2023-10-26 ENCOUNTER — Other Ambulatory Visit: Payer: Self-pay | Admitting: Family Medicine

## 2023-10-26 DIAGNOSIS — E119 Type 2 diabetes mellitus without complications: Secondary | ICD-10-CM

## 2023-10-26 DIAGNOSIS — G5601 Carpal tunnel syndrome, right upper limb: Secondary | ICD-10-CM

## 2023-10-26 MED ORDER — MELOXICAM 15 MG PO TABS
15.0000 mg | ORAL_TABLET | Freq: Every day | ORAL | 0 refills | Status: DC
Start: 2023-10-26 — End: 2023-12-01

## 2023-10-26 MED ORDER — SEMAGLUTIDE(0.25 OR 0.5MG/DOS) 2 MG/3ML ~~LOC~~ SOPN: PEN_INJECTOR | SUBCUTANEOUS | 0 refills | Status: DC

## 2023-10-26 NOTE — Progress Notes (Signed)
Chief Complaint  Patient presents with   Foot Pain    PATIENT STATES THAT SHE HAS BILATERAL TIGHTNESS IN FEET ON TOP OF BILATERAL FEET , PATIENT STATES SHE IS TAKING TYLENOL FOR PAIN . THIS HAS BEEN FOR ABOUT 6 MONTHS NOW     HPI: 62 y.o. female presenting today for follow-up evaluation of right ankle pain and stiffness to the bilateral feet.  Patient states that her right knee is doing well.  She continues to have some tightness in the top of her bilateral feet.  Brief history: History of Lapidus bunionectomy left lower extremity about 2 years ago with Dr. Lajoyce Corners.  Recently underwent right knee replacement on 07/17/2023 with Dr. Lajoyce Corners.  Past Medical History:  Diagnosis Date   Arthritis    right knee, lower back   Chest wall pain 04/20/2021   Dyspnea    very rare -tx with albuterol neb sol if needed   Hyperlipidemia    Hypertension    Intertrigo 08/06/2022   Pre-diabetes    per patient - does not check blood sugar   Right hip pain 05/05/2018   Rupture of anterior cruciate ligament of right knee 02/15/2018   Sprain of medial collateral ligament of right knee 02/15/2018   SVD (spontaneous vaginal delivery)    x 5 - only 2 living, 1 stillborn and 1 premature at 7 months demise,1 child deceased   Wears dentures    upper and lower    Past Surgical History:  Procedure Laterality Date   FOOT ARTHRODESIS Left 11/02/2020   Procedure: LEFT MIDFOOT FUSION BASE 1ST AND 2ND METATARSAL;  Surgeon: Nadara Mustard, MD;  Location: [facility] OR;  Service: Orthopedics;  Laterality: Left;   LUMBAR FUSION  05/20/2019   GILL PROCEDURE, LEFT TRANSFORAMINAL LUMBAR INTERBODY FUSION, PEDICLE INSTRUMENTATION, BILATERAL FUSION (N/A   TOTAL KNEE ARTHROPLASTY Right 08/21/2023   Procedure: RIGHT TOTAL KNEE ARTHROPLASTY;  Surgeon: Nadara Mustard, MD;  Location: [facility] OR;  Service: Orthopedics;  Laterality: Right;   TUBAL LIGATION      No Known Allergies   Physical Exam: General: The patient is alert and oriented x3  in no acute distress.  Dermatology: Skin is warm, dry and supple bilateral lower extremities.  Hyperkeratotic dystrophic nails noted 1-5 bilateral consistent with fungal nail infection  Vascular: Palpable pedal pulses bilaterally. Capillary refill within normal limits.  No appreciable edema.  No erythema.  Neurological: Grossly intact via light touch  Musculoskeletal Exam: Flexible; reducible hammertoe deformity noted digits 2-5 bilateral  Radiographic Exam B/L feet 10/19/2023:  Normal osseous mineralization.  Orthopedic screws noted through the first TMT as well as the medial cuneiform into the base of the second metatarsal.  Appears stable and intact.  No radiographic evidence of loosening or hardware failure.  Assessment/Plan of Care: 1.  Capsulitis with stiffness left midfoot secondary to surgery 2.  Reducible hammertoes lesser digits bilateral 3.  DJD/arthritis right ankle  -No cortisone injection administered today. -Continue wearing good supportive shoes and sneakers. -Continue compression ankle sleeve right - For now recommend conservative treatment.  Physical therapy. -Return to clinic as needed      Felecia Shelling, DPM [facility]  Dr. Felecia Shelling, DPM    [address]                                        [place],  [place] [address]                Office [phone]  Fax [phone]

## 2023-10-29 ENCOUNTER — Other Ambulatory Visit: Payer: Self-pay | Admitting: Family Medicine

## 2023-10-29 DIAGNOSIS — G5601 Carpal tunnel syndrome, right upper limb: Secondary | ICD-10-CM

## 2023-10-30 ENCOUNTER — Other Ambulatory Visit: Payer: Self-pay | Admitting: Family Medicine

## 2023-10-30 DIAGNOSIS — E785 Hyperlipidemia, unspecified: Secondary | ICD-10-CM

## 2023-11-03 NOTE — Therapy (Signed)
OUTPATIENT PHYSICAL THERAPY LOWER EXTREMITY TREATMENT   Patient Name: Sandra Lozano MRN: [id number] DOB:06-Oct-1961, 62 y.o., female Today's Date: 11/04/2023  END OF SESSION:  PT End of Session - 11/04/23 0855     Visit Number 16    Number of Visits 25    Date for PT Re-Evaluation 12/04/23    Authorization Type [place] MEDICAID [facility]    Authorization Time Period Approved 12 visits 10/14/23-12/16/23    Authorization - Visit Number 3    Authorization - Number of Visits 12    PT Start Time 0847    PT Stop Time 0929    PT Time Calculation (min) 42 min    Activity Tolerance Patient tolerated treatment well    Behavior During Therapy [facility] for tasks assessed/performed                   Past Medical History:  Diagnosis Date   Arthritis    right knee, lower back   Chest wall pain 04/20/2021   Dyspnea    very rare -tx with albuterol neb sol if needed   Hyperlipidemia    Hypertension    Intertrigo 08/06/2022   Pre-diabetes    per patient - does not check blood sugar   Right hip pain 05/05/2018   Rupture of anterior cruciate ligament of right knee 02/15/2018   Sprain of medial collateral ligament of right knee 02/15/2018   SVD (spontaneous vaginal delivery)    x 5 - only 2 living, 1 stillborn and 1 premature at 7 months demise,1 child deceased   Wears dentures    upper and lower   Past Surgical History:  Procedure Laterality Date   FOOT ARTHRODESIS Left 11/02/2020   Procedure: LEFT MIDFOOT FUSION BASE 1ST AND 2ND METATARSAL;  Surgeon: Nadara Mustard, MD;  Location: [facility] OR;  Service: Orthopedics;  Laterality: Left;   LUMBAR FUSION  05/20/2019   GILL PROCEDURE, LEFT TRANSFORAMINAL LUMBAR INTERBODY FUSION, PEDICLE INSTRUMENTATION, BILATERAL FUSION (N/A   TOTAL KNEE ARTHROPLASTY Right 08/21/2023   Procedure: RIGHT TOTAL KNEE ARTHROPLASTY;  Surgeon: Nadara Mustard, MD;  Location: [facility] OR;  Service: Orthopedics;  Laterality: Right;   TUBAL LIGATION     Patient Active Problem  List   Diagnosis Date Noted   Unilateral primary osteoarthritis, right knee 08/21/2023   Total knee replacement status, right 08/21/2023   Hidradenitis suppurativa of right axilla 06/03/2023   Vaginal irritation 02/21/2023   Skin lesion of back 02/21/2023   Tinea 12/18/2022   Left shoulder pain 08/25/2022   DJD (degenerative joint disease), multiple sites 08/06/2022   COPD, mild (HCC) 02/11/2022   Carpal tunnel syndrome, bilateral 12/21/2021   Arthritis of finger of left hand 11/26/2021   Traumatic arthritis of left foot    Lichen simplex chronicus 06/04/2020   Arthritis of midfoot 03/14/2020   History of lumbar fusion 03/14/2020   Well controlled diabetes mellitus (HCC) 11/07/2019   Nummular eczema 07/06/2019   Lumbar stenosis 05/20/2019   Acute left-sided low back pain without sciatica 07/18/2018   Osteoarthritis of right hip 06/03/2018   Chronic right shoulder pain 05/05/2018   Essential hypertension 02/15/2018    PCP: Lockie Mola, MD   REFERRING PROVIDER: Nadara Mustard, MD   REFERRING DIAG:  R53.1 (ICD-10-CM) - Weakness  Z96.659 (ICD-10-CM) - S/P knee replacement    THERAPY DIAG:  Chronic pain of right knee  Muscle weakness (generalized)  Difficulty in walking, not elsewhere classified  Rationale for Evaluation and Treatment: Rehabilitation  ONSET DATE: 08/21/23 surgery  SUBJECTIVE:   SUBJECTIVE STATEMENT: Pt reports she is consistently completing her HEP.  PERTINENT HISTORY: HTN  PAIN:  Are you having pain? Yes: NPRS scale: 1/10 Pain location: R knee Pain description: ache, discomfort Aggravating factors: Moving, walking Relieving factors: Ice man, tylenol  PRECAUTIONS: Knee  RED FLAGS: None   WEIGHT BEARING RESTRICTIONS: No WBAT  FALLS:  Has patient fallen in last 6 months? No  LIVING ENVIRONMENT: Lives with: lives with their family Lives in: House/apartment Stairs: Yes: External: 14 steps; can reach both   OCCUPATION: Not  working  PLOF: Independent with household mobility with device and Independent with community mobility with device  PATIENT GOALS: Good use of my R knee  NEXT MD VISIT: 09/08/23  OBJECTIVE:   PATIENT SURVEYS:  FOTO: Perceived function   39%, predicted   51%    COGNITION: Overall cognitive status: Within functional limits for tasks assessed     SENSATION: WFL  EDEMA:  Swelling of the R knee  MUSCLE LENGTH: Hamstrings: Right NT deg; Left NT deg Maisie Fus test: Right NT deg; Left NT deg  POSTURE: No Significant postural limitations  PALPATION: TTP of the peri-R knee and thigh  LOWER EXTREMITY ROM:  Active assisted ROM Right eval Left eval Right  Right 09/07/23 Right  09/14/23 Right 09/21/23 Right 09/29/23 Right 10/12/23 10/23/23 RT  Hip flexion           Hip extension           Hip abduction           Hip adduction           Hip internal rotation           Hip external rotation                      Knee flexion 80  98 AA 107 A 108 AA  117  120 A  Knee extension 10 lacking    -20 quad lag -20 -20 -15 -12  Ankle dorsiflexion           Ankle plantarflexion           Ankle inversion           Ankle eversion           11/04/23= -12 for quad lag c NMES   (Blank rows = not tested)  LOWER EXTREMITY MMT:  MMT Right eval Left eval RT  Hip flexion 2  3  Hip extension     Hip abduction 2  3  Hip adduction     Hip internal rotation 2    Hip external rotation 2  3  Knee flexion 2  3  Knee extension 2  3-  Ankle dorsiflexion     Ankle plantarflexion     Ankle inversion     Ankle eversion      (Blank rows = not tested)  LOWER EXTREMITY SPECIAL TESTS:  NT  FUNCTIONAL TESTS:  Complete and 5xSTS when pt is no longer needing an assist device with walking 2 MWT with RW 186 Feet   GAIT: Distance walked: 165ft Assistive device utilized: Walker - 2 wheeled Level of assistance: Complete Independence Comments: Step to pattern c L LE   TODAY'S TREATMENT:   [facility] Adult PT Treatment:  DATE: 11/04/23 Therapeutic Exercise: Nustep L6 LE only x 10 minutes for knee extension strengthening Right hip flexor stretch with strap   Neuromuscular re-ed: E stim attended: NMES 30.5 mA x 20 minutes including set up  , right quad, 10 sec on/ 10 sec off concurrent with:  SAQ x 5 min SLR x 5 min LAQ x 5 min   [facility] Adult PT Treatment:                                                DATE: 10/23/23 Therapeutic Exercise: Nustep L6 LE only x 10 minutes for knee extension strengthening Right hip flexor stretch with strap   Neuromuscular re-ed: E stim attended: NMES 30.5 mA x 20 minutes including set up , right quad, 10 sec on/ 10 sec off concurrent with:  SAQ x 5 min SLR x 5 min LAQ x 5 min   [facility] Adult PT Treatment:                                                DATE: 10/21/23 Therapeutic Exercise: Nustep L5 LE only x 8 minutes  LAQ 5# 2x10 SLR with quad lag present (-15) x 10 Wall squat slides to 60d 2x10 Lateral side step ups 2x10, veral cue for control STS x10 Standing hip abd, ext, knee curl x15 each 4x Standing SLR 2x10  [facility] Adult PT Treatment:                                                DATE: 10/14/23 Therapeutic Exercise: Nustep L5 LE only x 5 minutes  5# OMEGA Knee extension Single leg x 10 15# OMEGA knee flexion  eccentric RLE SLR with quad lag present (-15) x 10 Supine March  SAQ 4# 2x10 Side clam red x 10 Side reverse clam x10 STS x10 Standing hip abd, ext, knee curl x15 each 4# Modalities: Cold pack x10 min R knee  [facility] Adult PT Treatment:                                                DATE: 10/12/23 Therapeutic Exercise: Nustep L5 LE only x 5 minutes  5# OMEGA Knee extension Single leg x 10 15# OMEGA knee flexion  eccentric RLE SLR with quad lag present (-15) x 10 Supine March  SAQ 4#  Side clam red x 10 Side reverse clam  Side hip abdct x 5 R Prone hip ext x 10R Prone QS x  10                     PATIENT EDUCATION:  Education details: Eval findings, POC, HEP, self care  Person educated: Patient Education method: Explanation, Demonstration, Tactile cues, Verbal cues, and Handouts Education comprehension: verbalized understanding, returned demonstration, verbal cues required, tactile cues required, and needs further education  HOME EXERCISE PROGRAM: Access Code: [email or web address] URL: [email or web address] Date: 08/28/2023 Prepared by: Joellyn Rued  Exercises - Supine Quad  Set  - 3 x daily - 7 x weekly - 1 sets - 10 reps - 5 hold - Supine Heel Slide with Strap  - 3 x daily - 7 x weekly - 1 sets - 10 reps - 5 hold - Supine Knee Extension Strengthening  - 3 x daily - 7 x weekly - 1 sets - 10 reps - 3 hold - Sit to Stand with Counter Support  - 3 x daily - 7 x weekly - 1 sets - 5 reps - Sitting Knee Extension with Resistance  - 1 x daily - 7 x weekly - 2 sets - 10 reps - Seated Hamstring Curl with Anchored Resistance  - 1 x daily - 7 x weekly - 2 sets - 10 reps - Standing Terminal Knee Extension with Resistance  - 1 x daily - 7 x weekly - 2 sets - 10 reps - 5 hold  ASSESSMENT:  CLINICAL IMPRESSION: PT was continued c focus on R quad/LE strengthening. Continued NMES to re-educate quad contraction to decrease quad Lag. -12 quad leg with SLR during NMES. Pt is responding appropriately to Estim with quad lag decreased during use. Pt tolerated PT today without adverse effects. Pt will continue to benefit from skilled PT to address impairments for improved functional mobility. Will assess R quad lag c and s Estim and and compare to the L the next PT session.  RE-EVAL: Pt is making progress re: R knee AROM, strength, and function. Regaining quad strength has been slower than anticipated, but pt is beginning to show improved strength as per her quad lag measure. Functionally, pt is continuing to need a RW for an assist device due to her R quad weakness, but she is  finding her function with daily activities is improving as per her FOTO functional ability score. Pt will continue to benefit from skilled PT 2w6 to address impairments for improved R knee/LE function progressing pt's walking to a SPC as her R knee/leg strength improves. Will utilize BFR and or [place] stim to assist with strengthening as indicated.    EVAL: Patient is a 62 y.o. female who was seen today for physical therapy evaluation and treatment for  R53.1 (ICD-10-CM) - Weakness  Z96.659 (ICD-10-CM) - S/P knee replacement  .Pt presents to PT walking c a RW, WBAT R LE, mod Ind. For 1 week s/p R TKR surgery, the pt's demonstrates good AAROM. Strength is limited with pt not being able to complete a SAQ or SLR. A HEP was initiated to address R knee/LE ROM and strength with pt returning demonstration. Pt will benefit from skilled PT 2w8 to address impairments to optimize function with less pain.   OBJECTIVE IMPAIRMENTS: decreased activity tolerance, decreased mobility, difficulty walking, decreased ROM, decreased strength, increased edema, and pain.   ACTIVITY LIMITATIONS: carrying, lifting, bending, sitting, standing, squatting, sleeping, stairs, transfers, bed mobility, bathing, toileting, dressing, self feeding, hygiene/grooming, locomotion level, and caring for others  PARTICIPATION LIMITATIONS: meal prep, cleaning, laundry, driving, shopping, and community activity  PERSONAL FACTORS: Past/current experiences and Time since onset of injury/illness/exacerbation are also affecting patient's functional outcome.   REHAB POTENTIAL: Excellent  CLINICAL DECISION MAKING: Stable/uncomplicated  EVALUATION COMPLEXITY: Low   GOALS:  SHORT TERM GOALS: Target date: 09/18/23 Pt will be Ind in an initial HEP  Baseline: started Goal status: MET  2.  Increase L knee AROM to 5-100d to improve R knee function Baseline: 10-80d 09/07/23: significant quad lag Goal status: PARTIALLY MET (for flexion)   3.  Pt will be able to complete a SAQ and SLR independently Baseline: unable 09/07/23: unable 09/14/23: can complete with -20 d lag 10/12/23: -15 quad lag Goal status: ONGOING  LONG TERM GOALS: Target date: 12/04/23  Pt will be Ind in a final HEP to maintain achieved LOF Baseline: started Goal status: INITIAL  2.  Increase L knee AROM to 0-115d to for appropriate R knee function Baseline: 10-80d 09/29/23: 20-117 10/01/23: c quad set 3d lacking, with SLR -20d quad lag 10/12/23: SLR -15d quad lag Goal status: ONGOING  3.  Increased R hip and knee strength to 4/5 or greater for appropriate R knee function Baseline: See flow sheets Goal status: IMPROVED  4.  Pt will be able to walk 843ft Indly and asc/dsc 12 steps c a HR and mod Ind for community mobility  Baseline:  Goal status: ONGOING  5.  Improve 5xSTS by MCID of 5" and by MCID of 25ft as indication of improved functional mobility  Baseline: Complete and 5xSTS when pt is no longer needing an assist device with walking 10/05/23: 2 MWT 186 feet with RW; 5 x STS 15.2 sec Goal status: ONGOING  6.  Pt's FOTO score will improved to the predicted value of 51% as indication of improved function  Baseline: 39%.  10/07/23=54% Goal status: MET   PLAN:  PT FREQUENCY: 2x/week  PT DURATION: 8 weeks  PLANNED INTERVENTIONS: Therapeutic exercises, Therapeutic activity, Neuromuscular re-education, Balance training, Gait training, Patient/Family education, Self Care, Joint mobilization, Stair training, Aquatic Therapy, Dry Needling, Electrical stimulation, Cryotherapy, Moist heat, Taping, Vasopneumatic device, Ultrasound, Ionotophoresis 4mg /ml Dexamethasone, Manual therapy, and Re-evaluation  PLAN FOR NEXT SESSION: FOTO status; assess response to HEP; progress therex as indicated; use of modalities, manual therapy; and TPDN as indicated. STRENGTHEN HIP   Joellyn Rued MS, PT 11/04/23 9:29 AM

## 2023-11-04 ENCOUNTER — Ambulatory Visit: Payer: Medicaid Other

## 2023-11-04 DIAGNOSIS — M6281 Muscle weakness (generalized): Secondary | ICD-10-CM | POA: Diagnosis not present

## 2023-11-04 DIAGNOSIS — R262 Difficulty in walking, not elsewhere classified: Secondary | ICD-10-CM | POA: Diagnosis not present

## 2023-11-04 DIAGNOSIS — M25561 Pain in right knee: Secondary | ICD-10-CM | POA: Diagnosis not present

## 2023-11-04 DIAGNOSIS — G8929 Other chronic pain: Secondary | ICD-10-CM

## 2023-11-05 NOTE — Therapy (Signed)
OUTPATIENT PHYSICAL THERAPY LOWER EXTREMITY TREATMENT   Patient Name: Sandra Lozano MRN: [id number] DOB:[birth year], 62 y.o., female Today's Date: 11/06/2023  END OF SESSION:  PT End of Session - 11/06/23 1128     Visit Number 17    Number of Visits 25    Date for PT Re-Evaluation 12/04/23    Authorization Type [place] MEDICAID [facility]    Authorization Time Period Approved 12 visits 10/14/23-12/16/23    Authorization - Visit Number 4    Authorization - Number of Visits 12    PT Start Time 1105    PT Stop Time 1145    PT Time Calculation (min) 40 min    Activity Tolerance Patient tolerated treatment well    Behavior During Therapy WFL for tasks assessed/performed                    Past Medical History:  Diagnosis Date   Arthritis    right knee, lower back   Chest wall pain 04/20/2021   Dyspnea    very rare -tx with albuterol neb sol if needed   Hyperlipidemia    Hypertension    Intertrigo 08/06/2022   Pre-diabetes    per patient - does not check blood sugar   Right hip pain 05/05/2018   Rupture of anterior cruciate ligament of right knee 02/15/2018   Sprain of medial collateral ligament of right knee 02/15/2018   SVD (spontaneous vaginal delivery)    x 5 - only 2 living, 1 stillborn and 1 premature at 7 months demise,1 child deceased   Wears dentures    upper and lower   Past Surgical History:  Procedure Laterality Date   FOOT ARTHRODESIS Left 11/02/2020   Procedure: LEFT MIDFOOT FUSION BASE 1ST AND 2ND METATARSAL;  Surgeon: Nadara Mustard, MD;  Location: [facility] OR;  Service: Orthopedics;  Laterality: Left;   LUMBAR FUSION  05/20/2019   GILL PROCEDURE, LEFT TRANSFORAMINAL LUMBAR INTERBODY FUSION, PEDICLE INSTRUMENTATION, BILATERAL FUSION (N/A   TOTAL KNEE ARTHROPLASTY Right 08/21/2023   Procedure: RIGHT TOTAL KNEE ARTHROPLASTY;  Surgeon: Nadara Mustard, MD;  Location: [facility] OR;  Service: Orthopedics;  Laterality: Right;   TUBAL LIGATION     Patient Active Problem  List   Diagnosis Date Noted   Unilateral primary osteoarthritis, right knee 08/21/2023   Total knee replacement status, right 08/21/2023   Hidradenitis suppurativa of right axilla 06/03/2023   Vaginal irritation 02/21/2023   Skin lesion of back 02/21/2023   Tinea 12/18/2022   Left shoulder pain 08/25/2022   DJD (degenerative joint disease), multiple sites 08/06/2022   COPD, mild (HCC) 02/11/2022   Carpal tunnel syndrome, bilateral 12/21/2021   Arthritis of finger of left hand 11/26/2021   Traumatic arthritis of left foot    Lichen simplex chronicus 06/04/2020   Arthritis of midfoot 03/14/2020   History of lumbar fusion 03/14/2020   Well controlled diabetes mellitus (HCC) 11/07/2019   Nummular eczema 07/06/2019   Lumbar stenosis 05/20/2019   Acute left-sided low back pain without sciatica 07/18/2018   Osteoarthritis of right hip 06/03/2018   Chronic right shoulder pain 05/05/2018   Essential hypertension 02/15/2018    PCP: Lockie Mola, MD   REFERRING PROVIDER: Nadara Mustard, MD   REFERRING DIAG:  R53.1 (ICD-10-CM) - Weakness  Z96.659 (ICD-10-CM) - S/P knee replacement    THERAPY DIAG:  Chronic pain of right knee  Muscle weakness (generalized)  Difficulty in walking, not elsewhere classified  Rationale for Evaluation and Treatment: Rehabilitation  ONSET DATE: 08/21/23 surgery  SUBJECTIVE:   SUBJECTIVE STATEMENT: Pt reports she is consistently completing her HEP.  PERTINENT HISTORY: HTN  PAIN:  Are you having pain? Yes: NPRS scale: 1/10 Pain location: R knee Pain description: ache, discomfort Aggravating factors: Moving, walking Relieving factors: Ice man, tylenol  PRECAUTIONS: Knee  RED FLAGS: None   WEIGHT BEARING RESTRICTIONS: No WBAT  FALLS:  Has patient fallen in last 6 months? No  LIVING ENVIRONMENT: Lives with: lives with their family Lives in: House/apartment Stairs: Yes: External: 14 steps; can reach both   OCCUPATION: Not  working  PLOF: Independent with household mobility with device and Independent with community mobility with device  PATIENT GOALS: Good use of my R knee  NEXT MD VISIT: 09/08/23  OBJECTIVE:   PATIENT SURVEYS:  FOTO: Perceived function   39%, predicted   51%    COGNITION: Overall cognitive status: Within functional limits for tasks assessed     SENSATION: WFL  EDEMA:  Swelling of the R knee  MUSCLE LENGTH: Hamstrings: Right NT deg; Left NT deg Maisie Fus test: Right NT deg; Left NT deg  POSTURE: No Significant postural limitations  PALPATION: TTP of the peri-R knee and thigh  LOWER EXTREMITY ROM:  Active assisted ROM Right eval Left eval Right  Right 09/07/23 Right  09/14/23 Right 09/21/23 Right 09/29/23 Right 10/12/23 10/23/23 RT  Hip flexion           Hip extension           Hip abduction           Hip adduction           Hip internal rotation           Hip external rotation                      Knee flexion 80  98 AA 107 A 108 AA  117  120 A  Knee extension 10 lacking    -20 quad lag -20 -20 -15 -12  Ankle dorsiflexion           Ankle plantarflexion           Ankle inversion           Ankle eversion           11/04/23= -12 for quad lag c NMES   (Blank rows = not tested)  LOWER EXTREMITY MMT:  MMT Right eval Left eval RT  Hip flexion 2  3  Hip extension     Hip abduction 2  3  Hip adduction     Hip internal rotation 2    Hip external rotation 2  3  Knee flexion 2  3  Knee extension 2  3-  Ankle dorsiflexion     Ankle plantarflexion     Ankle inversion     Ankle eversion      (Blank rows = not tested)  LOWER EXTREMITY SPECIAL TESTS:  NT  FUNCTIONAL TESTS:  Complete and 5xSTS when pt is no longer needing an assist device with walking 2 MWT with RW 186 Feet   GAIT: Distance walked: 15ft Assistive device utilized: Walker - 2 wheeled Level of assistance: Complete Independence Comments: Step to pattern c L LE   TODAY'S TREATMENT:   [facility] Adult PT Treatment:  DATE: 11/06/23 Therapeutic Exercise: Wall squat slides to 60d 2x10 Lateral side step ups 2x10 6", veral cue for control STS x10 Standing hip abd, ext, knee curl x15 each 4x Neuromuscular re-ed: E stim attended: NMES 25 mA x 20 minutes including set up, right quad, 10 sec on/ 10 sec off concurrent with:  SAQ x 5 min c 3# SLR x 5 min c 3# LAQ x 5 min c 3#  [facility] Adult PT Treatment:                                                DATE: 11/04/23 Therapeutic Exercise: Nustep L6 LE only x 10 minutes for knee extension strengthening Right hip flexor stretch with strap   Neuromuscular re-ed: E stim attended: NMES 30.5 mA x 20 minutes including set up  , right quad, 10 sec on/ 10 sec off concurrent with:  SAQ x 5 min SLR x 5 min LAQ x 5 min   [facility] Adult PT Treatment:                                                DATE: 10/23/23 Therapeutic Exercise: Nustep L6 LE only x 10 minutes for knee extension strengthening Right hip flexor stretch with strap   Neuromuscular re-ed: E stim attended: NMES 30.5 mA x 20 minutes including set up , right quad, 10 sec on/ 10 sec off concurrent with:  SAQ x 5 min SLR x 5 min LAQ x 5 min   [facility] Adult PT Treatment:                                                DATE: 10/21/23 Therapeutic Exercise: Nustep L5 LE only x 8 minutes  LAQ 5# 2x10 SLR with quad lag present (-15) x 10 Wall squat slides to 60d 2x10 Lateral side step ups 2x10, veral cue for control STS x10 Standing hip abd, ext, knee curl x15 each 4x Standing SLR 2x10                     PATIENT EDUCATION:  Education details: Eval findings, POC, HEP, self care  Person educated: Patient Education method: Explanation, Demonstration, Tactile cues, Verbal cues, and Handouts Education comprehension: verbalized understanding, returned demonstration, verbal cues required, tactile cues required, and needs further education  HOME  EXERCISE PROGRAM: Access Code: [email or web address] URL: [email or web address] Date: 08/28/2023 Prepared by: Joellyn Rued  Exercises - Supine Quad Set  - 3 x daily - 7 x weekly - 1 sets - 10 reps - 5 hold - Supine Heel Slide with Strap  - 3 x daily - 7 x weekly - 1 sets - 10 reps - 5 hold - Supine Knee Extension Strengthening  - 3 x daily - 7 x weekly - 1 sets - 10 reps - 3 hold - Sit to Stand with Counter Support  - 3 x daily - 7 x weekly - 1 sets - 5 reps - Sitting Knee Extension with Resistance  - 1 x daily - 7 x weekly - 2 sets - 10 reps - Seated  Hamstring Curl with Anchored Resistance  - 1 x daily - 7 x weekly - 2 sets - 10 reps - Standing Terminal Knee Extension with Resistance  - 1 x daily - 7 x weekly - 2 sets - 10 reps - 5 hold  ASSESSMENT:  CLINICAL IMPRESSION: With NMES pt's quad lag is improved from 20d to 12d lacking. Assessed L LE quad lag which was found to be 12d lacking. Will continue to work on R quad/LE strengthening to more closely match the L. Additionally, will continue gait training c the [facility] and progress walking tolerance as appropriate. Pt tolerated PT today without adverse effects. Pt will continue to benefit from skilled PT to address impairments for improved R knee/LE function.    RE-EVAL: Pt is making progress re: R knee AROM, strength, and function. Regaining quad strength has been slower than anticipated, but pt is beginning to show improved strength as per her quad lag measure. Functionally, pt is continuing to need a RW for an assist device due to her R quad weakness, but she is finding her function with daily activities is improving as per her FOTO functional ability score. Pt will continue to benefit from skilled PT 2w6 to address impairments for improved R knee/LE function progressing pt's walking to a SPC as her R knee/leg strength improves. Will utilize BFR and or [place] stim to assist with strengthening as indicated.    EVAL: Patient is a 62 y.o. female  who was seen today for physical therapy evaluation and treatment for  R53.1 (ICD-10-CM) - Weakness  Z96.659 (ICD-10-CM) - S/P knee replacement  .Pt presents to PT walking c a RW, WBAT R LE, mod Ind. For 1 week s/p R TKR surgery, the pt's demonstrates good AAROM. Strength is limited with pt not being able to complete a SAQ or SLR. A HEP was initiated to address R knee/LE ROM and strength with pt returning demonstration. Pt will benefit from skilled PT 2w8 to address impairments to optimize function with less pain.   OBJECTIVE IMPAIRMENTS: decreased activity tolerance, decreased mobility, difficulty walking, decreased ROM, decreased strength, increased edema, and pain.   ACTIVITY LIMITATIONS: carrying, lifting, bending, sitting, standing, squatting, sleeping, stairs, transfers, bed mobility, bathing, toileting, dressing, self feeding, hygiene/grooming, locomotion level, and caring for others  PARTICIPATION LIMITATIONS: meal prep, cleaning, laundry, driving, shopping, and community activity  PERSONAL FACTORS: Past/current experiences and Time since onset of injury/illness/exacerbation are also affecting patient's functional outcome.   REHAB POTENTIAL: Excellent  CLINICAL DECISION MAKING: Stable/uncomplicated  EVALUATION COMPLEXITY: Low   GOALS:  SHORT TERM GOALS: Target date: 09/18/23 Pt will be Ind in an initial HEP  Baseline: started Goal status: MET  2.  Increase L knee AROM to 5-100d to improve R knee function Baseline: 10-80d 09/07/23: significant quad lag Goal status: PARTIALLY MET (for flexion)   3.  Pt will be able to complete a SAQ and SLR independently Baseline: unable 09/07/23: unable 09/14/23: can complete with -20 d lag 10/12/23: -15 quad lag Goal status: ONGOING  LONG TERM GOALS: Target date: 12/04/23  Pt will be Ind in a final HEP to maintain achieved LOF Baseline: started Goal status: INITIAL  2.  Increase L knee AROM to 0-115d to for appropriate R knee  function Baseline: 10-80d 09/29/23: 20-117 10/01/23: c quad set 3d lacking, with SLR -20d quad lag 10/12/23: SLR -15d quad lag Goal status: ONGOING  3.  Increased R hip and knee strength to 4/5 or greater for appropriate R knee function Baseline:  See flow sheets Goal status: IMPROVED  4.  Pt will be able to walk 840ft Indly and asc/dsc 12 steps c a HR and mod Ind for community mobility  Baseline:  Goal status: ONGOING  5.  Improve 5xSTS by MCID of 5" and by MCID of 29ft as indication of improved functional mobility  Baseline: Complete and 5xSTS when pt is no longer needing an assist device with walking 10/05/23: 2 MWT 186 feet with RW; 5 x STS 15.2 sec Goal status: ONGOING  6.  Pt's FOTO score will improved to the predicted value of 51% as indication of improved function  Baseline: 39%.  10/07/23=54% Goal status: MET   PLAN:  PT FREQUENCY: 2x/week  PT DURATION: 8 weeks  PLANNED INTERVENTIONS: Therapeutic exercises, Therapeutic activity, Neuromuscular re-education, Balance training, Gait training, Patient/Family education, Self Care, Joint mobilization, Stair training, Aquatic Therapy, Dry Needling, Electrical stimulation, Cryotherapy, Moist heat, Taping, Vasopneumatic device, Ultrasound, Ionotophoresis 4mg /ml Dexamethasone, Manual therapy, and Re-evaluation  PLAN FOR NEXT SESSION: FOTO status; assess response to HEP; progress therex as indicated; use of modalities, manual therapy; and TPDN as indicated. STRENGTHEN HIP   Joellyn Rued MS, PT 11/06/23 1:54 PM

## 2023-11-06 ENCOUNTER — Ambulatory Visit: Payer: Medicaid Other

## 2023-11-06 DIAGNOSIS — R262 Difficulty in walking, not elsewhere classified: Secondary | ICD-10-CM | POA: Diagnosis not present

## 2023-11-06 DIAGNOSIS — G8929 Other chronic pain: Secondary | ICD-10-CM

## 2023-11-06 DIAGNOSIS — M6281 Muscle weakness (generalized): Secondary | ICD-10-CM | POA: Diagnosis not present

## 2023-11-06 DIAGNOSIS — M25561 Pain in right knee: Secondary | ICD-10-CM | POA: Diagnosis not present

## 2023-11-10 NOTE — Telephone Encounter (Signed)
Care team updated and letter sent for eye exam notes.

## 2023-11-10 NOTE — Therapy (Signed)
OUTPATIENT PHYSICAL THERAPY LOWER EXTREMITY TREATMENT   Patient Name: Sandra Lozano MRN: [id number] DOB:[birth year], 62 y.o., female Today's Date: 11/11/2023  END OF SESSION:  PT End of Session - 11/11/23 1120     Visit Number 18    Number of Visits 25    Date for PT Re-Evaluation 12/04/23    Authorization Type [place] MEDICAID [facility]    Authorization Time Period Approved 12 visits 10/14/23-12/16/23    Authorization - Visit Number 5    Authorization - Number of Visits 12    PT Start Time 1100    PT Stop Time 1145    PT Time Calculation (min) 45 min    Activity Tolerance Patient tolerated treatment well    Behavior During Therapy WFL for tasks assessed/performed                     Past Medical History:  Diagnosis Date   Arthritis    right knee, lower back   Chest wall pain 04/20/2021   Dyspnea    very rare -tx with albuterol neb sol if needed   Hyperlipidemia    Hypertension    Intertrigo 08/06/2022   Pre-diabetes    per patient - does not check blood sugar   Right hip pain 05/05/2018   Rupture of anterior cruciate ligament of right knee 02/15/2018   Sprain of medial collateral ligament of right knee 02/15/2018   SVD (spontaneous vaginal delivery)    x 5 - only 2 living, 1 stillborn and 1 premature at 7 months demise,1 child deceased   Wears dentures    upper and lower   Past Surgical History:  Procedure Laterality Date   FOOT ARTHRODESIS Left 11/02/2020   Procedure: LEFT MIDFOOT FUSION BASE 1ST AND 2ND METATARSAL;  Surgeon: Nadara Mustard, MD;  Location: [facility] OR;  Service: Orthopedics;  Laterality: Left;   LUMBAR FUSION  05/20/2019   GILL PROCEDURE, LEFT TRANSFORAMINAL LUMBAR INTERBODY FUSION, PEDICLE INSTRUMENTATION, BILATERAL FUSION (N/A   TOTAL KNEE ARTHROPLASTY Right 08/21/2023   Procedure: RIGHT TOTAL KNEE ARTHROPLASTY;  Surgeon: Nadara Mustard, MD;  Location: [facility] OR;  Service: Orthopedics;  Laterality: Right;   TUBAL LIGATION     Patient Active  Problem List   Diagnosis Date Noted   Unilateral primary osteoarthritis, right knee 08/21/2023   Total knee replacement status, right 08/21/2023   Hidradenitis suppurativa of right axilla 06/03/2023   Vaginal irritation 02/21/2023   Skin lesion of back 02/21/2023   Tinea 12/18/2022   Left shoulder pain 08/25/2022   DJD (degenerative joint disease), multiple sites 08/06/2022   COPD, mild (HCC) 02/11/2022   Carpal tunnel syndrome, bilateral 12/21/2021   Arthritis of finger of left hand 11/26/2021   Traumatic arthritis of left foot    Lichen simplex chronicus 06/04/2020   Arthritis of midfoot 03/14/2020   History of lumbar fusion 03/14/2020   Well controlled diabetes mellitus (HCC) 11/07/2019   Nummular eczema 07/06/2019   Lumbar stenosis 05/20/2019   Acute left-sided low back pain without sciatica 07/18/2018   Osteoarthritis of right hip 06/03/2018   Chronic right shoulder pain 05/05/2018   Essential hypertension 02/15/2018    PCP: Lockie Mola, MD   REFERRING PROVIDER: Nadara Mustard, MD   REFERRING DIAG:  R53.1 (ICD-10-CM) - Weakness  Z96.659 (ICD-10-CM) - S/P knee replacement    THERAPY DIAG:  Chronic pain of right knee  Muscle weakness (generalized)  Difficulty in walking, not elsewhere classified  Rationale for Evaluation and Treatment:  Rehabilitation  ONSET DATE: 08/21/23 surgery  SUBJECTIVE:   SUBJECTIVE STATEMENT: Pt reports she is not experiencing R knee pain today  PERTINENT HISTORY: HTN  PAIN:  Are you having pain? Yes: NPRS scale: 0/10 Pain location: R knee Pain description: ache, discomfort Aggravating factors: Moving, walking Relieving factors: Ice man, tylenol  PRECAUTIONS: Knee  RED FLAGS: None   WEIGHT BEARING RESTRICTIONS: No WBAT  FALLS:  Has patient fallen in last 6 months? No  LIVING ENVIRONMENT: Lives with: lives with their family Lives in: House/apartment Stairs: Yes: External: 14 steps; can reach both   OCCUPATION:  Not working  PLOF: Independent with household mobility with device and Independent with community mobility with device  PATIENT GOALS: Good use of my R knee  NEXT MD VISIT: 09/08/23  OBJECTIVE:   PATIENT SURVEYS:  FOTO: Perceived function   39%, predicted   51%    COGNITION: Overall cognitive status: Within functional limits for tasks assessed     SENSATION: WFL  EDEMA:  Swelling of the R knee  MUSCLE LENGTH: Hamstrings: Right NT deg; Left NT deg Thomas test: Right NT deg; Left NT deg  POSTURE: No Significant postural limitations  PALPATION: TTP of the peri-R knee and thigh  LOWER EXTREMITY ROM:  Active assisted ROM Right eval Left eval Right  Right 09/07/23 Right  09/14/23 Right 09/21/23 Right 09/29/23 Right 10/12/23 10/23/23 RT  Hip flexion           Hip extension           Hip abduction           Hip adduction           Hip internal rotation           Hip external rotation                      Knee flexion 80  98 AA 107 A 108 AA  117  120 A  Knee extension 10 lacking    -20 quad lag -20 -20 -15 -12  Ankle dorsiflexion           Ankle plantarflexion           Ankle inversion           Ankle eversion           11/04/23= -12 for quad lag c NMES. 11/11/23= -15 s NMES   (Blank rows = not tested)  LOWER EXTREMITY MMT:  MMT Right eval Left eval RT  Hip flexion 2  3  Hip extension     Hip abduction 2  3  Hip adduction     Hip internal rotation 2    Hip external rotation 2  3  Knee flexion 2  3  Knee extension 2  3-  Ankle dorsiflexion     Ankle plantarflexion     Ankle inversion     Ankle eversion      (Blank rows = not tested)  LOWER EXTREMITY SPECIAL TESTS:  NT  FUNCTIONAL TESTS:  Complete and 5xSTS when pt is no longer needing an assist device with walking 2 MWT with RW 186 Feet   GAIT: Distance walked: 164ft Assistive device utilized: Walker - 2 wheeled Level of assistance: Complete Independence Comments: Step to pattern c L  LE   TODAY'S TREATMENT:  [facility] Adult PT Treatment:  DATE: 11/11/23 Therapeutic Exercise: Wall squat slides to 60d 2x10 Lateral side step ups 2x10 6", veral cue for control STS x10 Standing hip abd, ext, knee curl x15 each 4x Neuromuscular re-ed: E stim attended: NMES 25 mA x 15 minutes including set up, right quad, 10 sec on/ 10 sec off concurrent with:  SAQ x 5 min c 3# SLR x 5 min c 3# LAQ x 5 min c 3# Therapeutic Activity: Gait training: With a SPC 455 ft and SBA. Verbal cueing was provided for proper sequencing of the [facility]. Pt demonstrates decreased hip stability c the [facility]  [facility] Adult PT Treatment:                                                DATE: 11/06/23 Therapeutic Exercise: Wall squat slides to 60d 2x10 Lateral side step ups 2x10 6", veral cue for control STS x10 Standing hip abd, ext, knee curl x15 each 4x Neuromuscular re-ed: E stim attended: NMES 25 mA x 20 minutes including set up, right quad, 10 sec on/ 10 sec off concurrent with:  SAQ x 5 min c 3# SLR x 5 min c 3# LAQ x 5 min c 3#  [facility] Adult PT Treatment:                                                DATE: 11/04/23 Therapeutic Exercise: Nustep L6 LE only x 10 minutes for knee extension strengthening Right hip flexor stretch with strap   Neuromuscular re-ed: E stim attended: NMES 30.5 mA x 20 minutes including set up  , right quad, 10 sec on/ 10 sec off concurrent with:  SAQ x 5 min SLR x 5 min LAQ x 5 min    PATIENT EDUCATION:  Education details: Eval findings, POC, HEP, self care  Person educated: Patient Education method: Explanation, Demonstration, Tactile cues, Verbal cues, and Handouts Education comprehension: verbalized understanding, returned demonstration, verbal cues required, tactile cues required, and needs further education  HOME EXERCISE PROGRAM: Access Code: [email or web address] URL: [email or web address] Date: 08/28/2023 Prepared by:  Joellyn Rued  Exercises - Supine Quad Set  - 3 x daily - 7 x weekly - 1 sets - 10 reps - 5 hold - Supine Heel Slide with Strap  - 3 x daily - 7 x weekly - 1 sets - 10 reps - 5 hold - Supine Knee Extension Strengthening  - 3 x daily - 7 x weekly - 1 sets - 10 reps - 3 hold - Sit to Stand with Counter Support  - 3 x daily - 7 x weekly - 1 sets - 5 reps - Sitting Knee Extension with Resistance  - 1 x daily - 7 x weekly - 2 sets - 10 reps - Seated Hamstring Curl with Anchored Resistance  - 1 x daily - 7 x weekly - 2 sets - 10 reps - Standing Terminal Knee Extension with Resistance  - 1 x daily - 7 x weekly - 2 sets - 10 reps - 5 hold  ASSESSMENT:  CLINICAL IMPRESSION: R Quad strengthening c NMES was continued. R quad lag is improved to 15d lacking. Completed gait training today with the SPC. Pt needed verbal cueing for proper sequencing of the  SPC, and decreased hip stability was observed. Additionally, strengthening for both LEs was completed. Recommended pt continuing to use the RW as an assist device due to not only R quad weakness, but for weakness of both hips.   Will continue to work on R quad/LE strengthening to more closely match the L. Additionally, will continue gait training c the [facility] and progress walking tolerance as appropriate. Pt tolerated PT today without adverse effects. Pt will continue to benefit from skilled PT to address impairments for improved R knee/LE function.    RE-EVAL: Pt is making progress re: R knee AROM, strength, and function. Regaining quad strength has been slower than anticipated, but pt is beginning to show improved strength as per her quad lag measure. Functionally, pt is continuing to need a RW for an assist device due to her R quad weakness, but she is finding her function with daily activities is improving as per her FOTO functional ability score. Pt will continue to benefit from skilled PT 2w6 to address impairments for improved R knee/LE function progressing  pt's walking to a SPC as her R knee/leg strength improves. Will utilize BFR and or [place] stim to assist with strengthening as indicated.    EVAL: Patient is a 62 y.o. female who was seen today for physical therapy evaluation and treatment for  R53.1 (ICD-10-CM) - Weakness  Z96.659 (ICD-10-CM) - S/P knee replacement  .Pt presents to PT walking c a RW, WBAT R LE, mod Ind. For 1 week s/p R TKR surgery, the pt's demonstrates good AAROM. Strength is limited with pt not being able to complete a SAQ or SLR. A HEP was initiated to address R knee/LE ROM and strength with pt returning demonstration. Pt will benefit from skilled PT 2w8 to address impairments to optimize function with less pain.   OBJECTIVE IMPAIRMENTS: decreased activity tolerance, decreased mobility, difficulty walking, decreased ROM, decreased strength, increased edema, and pain.   ACTIVITY LIMITATIONS: carrying, lifting, bending, sitting, standing, squatting, sleeping, stairs, transfers, bed mobility, bathing, toileting, dressing, self feeding, hygiene/grooming, locomotion level, and caring for others  PARTICIPATION LIMITATIONS: meal prep, cleaning, laundry, driving, shopping, and community activity  PERSONAL FACTORS: Past/current experiences and Time since onset of injury/illness/exacerbation are also affecting patient's functional outcome.   REHAB POTENTIAL: Excellent  CLINICAL DECISION MAKING: Stable/uncomplicated  EVALUATION COMPLEXITY: Low   GOALS:  SHORT TERM GOALS: Target date: 09/18/23 Pt will be Ind in an initial HEP  Baseline: started Goal status: MET  2.  Increase L knee AROM to 5-100d to improve R knee function Baseline: 10-80d 09/07/23: significant quad lag Goal status: PARTIALLY MET (for flexion)   3.  Pt will be able to complete a SAQ and SLR independently Baseline: unable 09/07/23: unable 09/14/23: can complete with -20 d lag 10/12/23: -15 quad lag Goal status: ONGOING  LONG TERM GOALS: Target date:  12/04/23  Pt will be Ind in a final HEP to maintain achieved LOF Baseline: started Goal status: INITIAL  2.  Increase L knee AROM to 0-115d to for appropriate R knee function Baseline: 10-80d 09/29/23: 20-117 10/01/23: c quad set 3d lacking, with SLR -20d quad lag 10/12/23: SLR -15d quad lag Goal status: ONGOING  3.  Increased R hip and knee strength to 4/5 or greater for appropriate R knee function Baseline: See flow sheets Goal status: IMPROVED  4.  Pt will be able to walk 842ft Indly and asc/dsc 12 steps c a HR and mod Ind for community mobility  Baseline:  11/10/23: 455'  Goal status: ONGOING  5.  Improve 5xSTS by MCID of 5" and by MCID of 84ft as indication of improved functional mobility  Baseline: Complete and 5xSTS when pt is no longer needing an assist device with walking 10/05/23: 2 MWT 186 feet with RW; 5 x STS 15.2 sec Goal status: ONGOING  6.  Pt's FOTO score will improved to the predicted value of 51% as indication of improved function  Baseline: 39%.  10/07/23=54% Goal status: MET   PLAN:  PT FREQUENCY: 2x/week  PT DURATION: 8 weeks  PLANNED INTERVENTIONS: Therapeutic exercises, Therapeutic activity, Neuromuscular re-education, Balance training, Gait training, Patient/Family education, Self Care, Joint mobilization, Stair training, Aquatic Therapy, Dry Needling, Electrical stimulation, Cryotherapy, Moist heat, Taping, Vasopneumatic device, Ultrasound, Ionotophoresis 4mg /ml Dexamethasone, Manual therapy, and Re-evaluation  PLAN FOR NEXT SESSION: FOTO status; assess response to HEP; progress therex as indicated; use of modalities, manual therapy; and TPDN as indicated. STRENGTHEN HIP   Joellyn Rued MS, PT 11/11/23 1:18 PM

## 2023-11-11 ENCOUNTER — Ambulatory Visit: Payer: Medicaid Other

## 2023-11-11 DIAGNOSIS — M6281 Muscle weakness (generalized): Secondary | ICD-10-CM

## 2023-11-11 DIAGNOSIS — G8929 Other chronic pain: Secondary | ICD-10-CM

## 2023-11-11 DIAGNOSIS — M25561 Pain in right knee: Secondary | ICD-10-CM | POA: Diagnosis not present

## 2023-11-11 DIAGNOSIS — R262 Difficulty in walking, not elsewhere classified: Secondary | ICD-10-CM

## 2023-11-15 DIAGNOSIS — Z419 Encounter for procedure for purposes other than remedying health state, unspecified: Secondary | ICD-10-CM | POA: Diagnosis not present

## 2023-11-16 ENCOUNTER — Telehealth: Payer: Self-pay | Admitting: Physical Medicine and Rehabilitation

## 2023-11-16 NOTE — Telephone Encounter (Signed)
Last injection 07/2023. Pt called requesting a call to set an appt for back injection. Please call pt at [phone].

## 2023-11-17 ENCOUNTER — Other Ambulatory Visit: Payer: Self-pay | Admitting: Physical Medicine and Rehabilitation

## 2023-11-17 DIAGNOSIS — M5416 Radiculopathy, lumbar region: Secondary | ICD-10-CM

## 2023-11-17 DIAGNOSIS — M48062 Spinal stenosis, lumbar region with neurogenic claudication: Secondary | ICD-10-CM

## 2023-11-17 NOTE — Therapy (Incomplete)
OUTPATIENT PHYSICAL THERAPY LOWER EXTREMITY TREATMENT   Patient Name: Sandra Lozano MRN: [id number] DOB:08/17/1961, 62 y.o., female Today's Date: 11/17/2023  END OF SESSION:            Past Medical History:  Diagnosis Date   Arthritis    right knee, lower back   Chest wall pain 04/20/2021   Dyspnea    very rare -tx with albuterol neb sol if needed   Hyperlipidemia    Hypertension    Intertrigo 08/06/2022   Pre-diabetes    per patient - does not check blood sugar   Right hip pain 05/05/2018   Rupture of anterior cruciate ligament of right knee 02/15/2018   Sprain of medial collateral ligament of right knee 02/15/2018   SVD (spontaneous vaginal delivery)    x 5 - only 2 living, 1 stillborn and 1 premature at 7 months demise,1 child deceased   Wears dentures    upper and lower   Past Surgical History:  Procedure Laterality Date   FOOT ARTHRODESIS Left 11/02/2020   Procedure: LEFT MIDFOOT FUSION BASE 1ST AND 2ND METATARSAL;  Surgeon: Nadara Mustard, MD;  Location: [facility] OR;  Service: Orthopedics;  Laterality: Left;   LUMBAR FUSION  05/20/2019   GILL PROCEDURE, LEFT TRANSFORAMINAL LUMBAR INTERBODY FUSION, PEDICLE INSTRUMENTATION, BILATERAL FUSION (N/A   TOTAL KNEE ARTHROPLASTY Right 08/21/2023   Procedure: RIGHT TOTAL KNEE ARTHROPLASTY;  Surgeon: Nadara Mustard, MD;  Location: [facility] OR;  Service: Orthopedics;  Laterality: Right;   TUBAL LIGATION     Patient Active Problem List   Diagnosis Date Noted   Unilateral primary osteoarthritis, right knee 08/21/2023   Total knee replacement status, right 08/21/2023   Hidradenitis suppurativa of right axilla 06/03/2023   Vaginal irritation 02/21/2023   Skin lesion of back 02/21/2023   Tinea 12/18/2022   Left shoulder pain 08/25/2022   DJD (degenerative joint disease), multiple sites 08/06/2022   COPD, mild (HCC) 02/11/2022   Carpal tunnel syndrome, bilateral 12/21/2021   Arthritis of finger of left hand 11/26/2021   Traumatic  arthritis of left foot    Lichen simplex chronicus 06/04/2020   Arthritis of midfoot 03/14/2020   History of lumbar fusion 03/14/2020   Well controlled diabetes mellitus (HCC) 11/07/2019   Nummular eczema 07/06/2019   Lumbar stenosis 05/20/2019   Acute left-sided low back pain without sciatica 07/18/2018   Osteoarthritis of right hip 06/03/2018   Chronic right shoulder pain 05/05/2018   Essential hypertension 02/15/2018    PCP: Lockie Mola, MD   REFERRING PROVIDER: Nadara Mustard, MD   REFERRING DIAG:  R53.1 (ICD-10-CM) - Weakness  Z96.659 (ICD-10-CM) - S/P knee replacement    THERAPY DIAG:  No diagnosis found.  Rationale for Evaluation and Treatment: Rehabilitation  ONSET DATE: 08/21/23 surgery  SUBJECTIVE:   SUBJECTIVE STATEMENT: Pt reports she is not experiencing R knee pain today  PERTINENT HISTORY: HTN  PAIN:  Are you having pain? Yes: NPRS scale: 0/10 Pain location: R knee Pain description: ache, discomfort Aggravating factors: Moving, walking Relieving factors: Ice man, tylenol  PRECAUTIONS: Knee  RED FLAGS: None   WEIGHT BEARING RESTRICTIONS: No WBAT  FALLS:  Has patient fallen in last 6 months? No  LIVING ENVIRONMENT: Lives with: lives with their family Lives in: House/apartment Stairs: Yes: External: 14 steps; can reach both   OCCUPATION: Not working  PLOF: Independent with household mobility with device and Independent with community mobility with device  PATIENT GOALS: Good use of my R knee  NEXT MD VISIT: 09/08/23  OBJECTIVE:   PATIENT SURVEYS:  FOTO: Perceived function   39%, predicted   51%    COGNITION: Overall cognitive status: Within functional limits for tasks assessed     SENSATION: WFL  EDEMA:  Swelling of the R knee  MUSCLE LENGTH: Hamstrings: Right NT deg; Left NT deg Maisie Fus test: Right NT deg; Left NT deg  POSTURE: No Significant postural limitations  PALPATION: TTP of the peri-R knee and thigh  LOWER  EXTREMITY ROM:  Active assisted ROM Right eval Left eval Right  Right 09/07/23 Right  09/14/23 Right 09/21/23 Right 09/29/23 Right 10/12/23 10/23/23 RT  Hip flexion           Hip extension           Hip abduction           Hip adduction           Hip internal rotation           Hip external rotation                      Knee flexion 80  98 AA 107 A 108 AA  117  120 A  Knee extension 10 lacking    -20 quad lag -20 -20 -15 -12  Ankle dorsiflexion           Ankle plantarflexion           Ankle inversion           Ankle eversion           11/04/23= -12 for quad lag c NMES. 11/11/23= -15 s NMES   (Blank rows = not tested)  LOWER EXTREMITY MMT:  MMT Right eval Left eval RT  Hip flexion 2  3  Hip extension     Hip abduction 2  3  Hip adduction     Hip internal rotation 2    Hip external rotation 2  3  Knee flexion 2  3  Knee extension 2  3-  Ankle dorsiflexion     Ankle plantarflexion     Ankle inversion     Ankle eversion      (Blank rows = not tested)  LOWER EXTREMITY SPECIAL TESTS:  NT  FUNCTIONAL TESTS:  Complete and 5xSTS when pt is no longer needing an assist device with walking 2 MWT with RW 186 Feet   GAIT: Distance walked: 159ft Assistive device utilized: Environmental consultant - 2 wheeled Level of assistance: Complete Independence Comments: Step to pattern c L LE   TODAY'S TREATMENT:  [facility] Adult PT Treatment:                                                DATE: 11/18/23 Therapeutic Exercise: Therapeutic Exercise: Wall squat slides to 60d 2x10 Lateral side step ups 2x10 6", veral cue for control STS x10 Standing hip abd, ext, knee curl x15 each 4x Neuromuscular re-ed: E stim attended: NMES 25 mA x 15 minutes including set up, right quad, 10 sec on/ 10 sec off concurrent with:  SAQ x 5 min c 3# SLR x 5 min c 3# LAQ x 5 min c 3# Therapeutic Activity: Gait training: With a SPC 455 ft and SBA. Verbal cueing was provided for proper sequencing of the [facility]. Pt  demonstrates decreased hip stability  c the [facility] Manual Therapy: *** Neuromuscular re-ed: *** Therapeutic Activity: *** Modalities: *** Self Care: ***  [facility] Adult PT Treatment:                                                DATE: 11/11/23 Therapeutic Exercise: Wall squat slides to 60d 2x10 Lateral side step ups 2x10 6", veral cue for control STS x10 Standing hip abd, ext, knee curl x15 each 4x Neuromuscular re-ed: E stim attended: NMES 25 mA x 15 minutes including set up, right quad, 10 sec on/ 10 sec off concurrent with:  SAQ x 5 min c 3# SLR x 5 min c 3# LAQ x 5 min c 3# Therapeutic Activity: Gait training: With a SPC 455 ft and SBA. Verbal cueing was provided for proper sequencing of the [facility]. Pt demonstrates decreased hip stability c the [facility]  [facility] Adult PT Treatment:                                                DATE: 11/06/23 Therapeutic Exercise: Wall squat slides to 60d 2x10 Lateral side step ups 2x10 6", veral cue for control STS x10 Standing hip abd, ext, knee curl x15 each 4x Neuromuscular re-ed: E stim attended: NMES 25 mA x 20 minutes including set up, right quad, 10 sec on/ 10 sec off concurrent with:  SAQ x 5 min c 3# SLR x 5 min c 3# LAQ x 5 min c 3#  [facility] Adult PT Treatment:                                                DATE: 11/04/23 Therapeutic Exercise: Nustep L6 LE only x 10 minutes for knee extension strengthening Right hip flexor stretch with strap   Neuromuscular re-ed: E stim attended: NMES 30.5 mA x 20 minutes including set up  , right quad, 10 sec on/ 10 sec off concurrent with:  SAQ x 5 min SLR x 5 min LAQ x 5 min    PATIENT EDUCATION:  Education details: Eval findings, POC, HEP, self care  Person educated: Patient Education method: Explanation, Demonstration, Tactile cues, Verbal cues, and Handouts Education comprehension: verbalized understanding, returned demonstration, verbal cues required, tactile cues required, and needs further  education  HOME EXERCISE PROGRAM: Access Code: [email or web address] URL: [email or web address] Date: 08/28/2023 Prepared by: Joellyn Rued  Exercises - Supine Quad Set  - 3 x daily - 7 x weekly - 1 sets - 10 reps - 5 hold - Supine Heel Slide with Strap  - 3 x daily - 7 x weekly - 1 sets - 10 reps - 5 hold - Supine Knee Extension Strengthening  - 3 x daily - 7 x weekly - 1 sets - 10 reps - 3 hold - Sit to Stand with Counter Support  - 3 x daily - 7 x weekly - 1 sets - 5 reps - Sitting Knee Extension with Resistance  - 1 x daily - 7 x weekly - 2 sets - 10 reps - Seated Hamstring Curl with Anchored Resistance  - 1 x daily - 7  x weekly - 2 sets - 10 reps - Standing Terminal Knee Extension with Resistance  - 1 x daily - 7 x weekly - 2 sets - 10 reps - 5 hold  ASSESSMENT:  CLINICAL IMPRESSION: R Quad strengthening c NMES was continued. R quad lag is improved to 15d lacking. Completed gait training today with the SPC. Pt needed verbal cueing for proper sequencing of the SPC, and decreased hip stability was observed. Additionally, strengthening for both LEs was completed. Recommended pt continuing to use the RW as an assist device due to not only R quad weakness, but for weakness of both hips.   Will continue to work on R quad/LE strengthening to more closely match the L. Additionally, will continue gait training c the [facility] and progress walking tolerance as appropriate. Pt tolerated PT today without adverse effects. Pt will continue to benefit from skilled PT to address impairments for improved R knee/LE function.    RE-EVAL: Pt is making progress re: R knee AROM, strength, and function. Regaining quad strength has been slower than anticipated, but pt is beginning to show improved strength as per her quad lag measure. Functionally, pt is continuing to need a RW for an assist device due to her R quad weakness, but she is finding her function with daily activities is improving as per her FOTO  functional ability score. Pt will continue to benefit from skilled PT 2w6 to address impairments for improved R knee/LE function progressing pt's walking to a SPC as her R knee/leg strength improves. Will utilize BFR and or [place] stim to assist with strengthening as indicated.    EVAL: Patient is a 62 y.o. female who was seen today for physical therapy evaluation and treatment for  R53.1 (ICD-10-CM) - Weakness  Z96.659 (ICD-10-CM) - S/P knee replacement  .Pt presents to PT walking c a RW, WBAT R LE, mod Ind. For 1 week s/p R TKR surgery, the pt's demonstrates good AAROM. Strength is limited with pt not being able to complete a SAQ or SLR. A HEP was initiated to address R knee/LE ROM and strength with pt returning demonstration. Pt will benefit from skilled PT 2w8 to address impairments to optimize function with less pain.   OBJECTIVE IMPAIRMENTS: decreased activity tolerance, decreased mobility, difficulty walking, decreased ROM, decreased strength, increased edema, and pain.   ACTIVITY LIMITATIONS: carrying, lifting, bending, sitting, standing, squatting, sleeping, stairs, transfers, bed mobility, bathing, toileting, dressing, self feeding, hygiene/grooming, locomotion level, and caring for others  PARTICIPATION LIMITATIONS: meal prep, cleaning, laundry, driving, shopping, and community activity  PERSONAL FACTORS: Past/current experiences and Time since onset of injury/illness/exacerbation are also affecting patient's functional outcome.   REHAB POTENTIAL: Excellent  CLINICAL DECISION MAKING: Stable/uncomplicated  EVALUATION COMPLEXITY: Low   GOALS:  SHORT TERM GOALS: Target date: 09/18/23 Pt will be Ind in an initial HEP  Baseline: started Goal status: MET  2.  Increase L knee AROM to 5-100d to improve R knee function Baseline: 10-80d 09/07/23: significant quad lag Goal status: PARTIALLY MET (for flexion)   3.  Pt will be able to complete a SAQ and SLR independently Baseline:  unable 09/07/23: unable 09/14/23: can complete with -20 d lag 10/12/23: -15 quad lag Goal status: ONGOING  LONG TERM GOALS: Target date: 12/04/23  Pt will be Ind in a final HEP to maintain achieved LOF Baseline: started Goal status: INITIAL  2.  Increase L knee AROM to 0-115d to for appropriate R knee function Baseline: 10-80d 09/29/23: 20-117 10/01/23: c quad  set 3d lacking, with SLR -20d quad lag 10/12/23: SLR -15d quad lag Goal status: ONGOING  3.  Increased R hip and knee strength to 4/5 or greater for appropriate R knee function Baseline: See flow sheets Goal status: IMPROVED  4.  Pt will be able to walk 852ft Indly and asc/dsc 12 steps c a HR and mod Ind for community mobility  Baseline:     11/10/23: 455'  Goal status: ONGOING  5.  Improve 5xSTS by MCID of 5" and by MCID of 68ft as indication of improved functional mobility  Baseline: Complete and 5xSTS when pt is no longer needing an assist device with walking 10/05/23: 2 MWT 186 feet with RW; 5 x STS 15.2 sec Goal status: ONGOING  6.  Pt's FOTO score will improved to the predicted value of 51% as indication of improved function  Baseline: 39%.  10/07/23=54% Goal status: MET   PLAN:  PT FREQUENCY: 2x/week  PT DURATION: 8 weeks  PLANNED INTERVENTIONS: Therapeutic exercises, Therapeutic activity, Neuromuscular re-education, Balance training, Gait training, Patient/Family education, Self Care, Joint mobilization, Stair training, Aquatic Therapy, Dry Needling, Electrical stimulation, Cryotherapy, Moist heat, Taping, Vasopneumatic device, Ultrasound, Ionotophoresis 4mg /ml Dexamethasone, Manual therapy, and Re-evaluation  PLAN FOR NEXT SESSION: FOTO status; assess response to HEP; progress therex as indicated; use of modalities, manual therapy; and TPDN as indicated. STRENGTHEN HIP   Joellyn Rued MS, PT 11/17/23 6:11 AM

## 2023-11-18 ENCOUNTER — Ambulatory Visit: Payer: Medicaid Other | Attending: Orthopedic Surgery | Admitting: Physical Therapy

## 2023-11-18 ENCOUNTER — Encounter: Payer: Self-pay | Admitting: Physical Therapy

## 2023-11-18 DIAGNOSIS — G8929 Other chronic pain: Secondary | ICD-10-CM | POA: Insufficient documentation

## 2023-11-18 DIAGNOSIS — M25561 Pain in right knee: Secondary | ICD-10-CM | POA: Diagnosis not present

## 2023-11-18 DIAGNOSIS — R262 Difficulty in walking, not elsewhere classified: Secondary | ICD-10-CM | POA: Diagnosis not present

## 2023-11-18 DIAGNOSIS — M6281 Muscle weakness (generalized): Secondary | ICD-10-CM | POA: Insufficient documentation

## 2023-11-18 NOTE — Therapy (Signed)
OUTPATIENT PHYSICAL THERAPY LOWER EXTREMITY TREATMENT   Patient Name: Sandra Lozano MRN: [id number] DOB:October 30, 1961, 62 y.o., female Today's Date: 11/18/2023  END OF SESSION:  PT End of Session - 11/18/23 0855     Visit Number 19    Number of Visits 25    Date for PT Re-Evaluation 12/04/23    Authorization Type [place] MEDICAID [facility]    Authorization Time Period Approved 12 visits 10/14/23-12/16/23    Authorization - Visit Number 6    Authorization - Number of Visits 12    PT Start Time 0850    PT Stop Time 0932    PT Time Calculation (min) 42 min                     Past Medical History:  Diagnosis Date   Arthritis    right knee, lower back   Chest wall pain 04/20/2021   Dyspnea    very rare -tx with albuterol neb sol if needed   Hyperlipidemia    Hypertension    Intertrigo 08/06/2022   Pre-diabetes    per patient - does not check blood sugar   Right hip pain 05/05/2018   Rupture of anterior cruciate ligament of right knee 02/15/2018   Sprain of medial collateral ligament of right knee 02/15/2018   SVD (spontaneous vaginal delivery)    x 5 - only 2 living, 1 stillborn and 1 premature at 7 months demise,1 child deceased   Wears dentures    upper and lower   Past Surgical History:  Procedure Laterality Date   FOOT ARTHRODESIS Left 11/02/2020   Procedure: LEFT MIDFOOT FUSION BASE 1ST AND 2ND METATARSAL;  Surgeon: Nadara Mustard, MD;  Location: [facility] OR;  Service: Orthopedics;  Laterality: Left;   LUMBAR FUSION  05/20/2019   GILL PROCEDURE, LEFT TRANSFORAMINAL LUMBAR INTERBODY FUSION, PEDICLE INSTRUMENTATION, BILATERAL FUSION (N/A   TOTAL KNEE ARTHROPLASTY Right 08/21/2023   Procedure: RIGHT TOTAL KNEE ARTHROPLASTY;  Surgeon: Nadara Mustard, MD;  Location: [facility] OR;  Service: Orthopedics;  Laterality: Right;   TUBAL LIGATION     Patient Active Problem List   Diagnosis Date Noted   Unilateral primary osteoarthritis, right knee 08/21/2023   Total knee replacement  status, right 08/21/2023   Hidradenitis suppurativa of right axilla 06/03/2023   Vaginal irritation 02/21/2023   Skin lesion of back 02/21/2023   Tinea 12/18/2022   Left shoulder pain 08/25/2022   DJD (degenerative joint disease), multiple sites 08/06/2022   COPD, mild (HCC) 02/11/2022   Carpal tunnel syndrome, bilateral 12/21/2021   Arthritis of finger of left hand 11/26/2021   Traumatic arthritis of left foot    Lichen simplex chronicus 06/04/2020   Arthritis of midfoot 03/14/2020   History of lumbar fusion 03/14/2020   Well controlled diabetes mellitus (HCC) 11/07/2019   Nummular eczema 07/06/2019   Lumbar stenosis 05/20/2019   Acute left-sided low back pain without sciatica 07/18/2018   Osteoarthritis of right hip 06/03/2018   Chronic right shoulder pain 05/05/2018   Essential hypertension 02/15/2018    PCP: Lockie Mola, MD   REFERRING PROVIDER: Nadara Mustard, MD   REFERRING DIAG:  R53.1 (ICD-10-CM) - Weakness  Z96.659 (ICD-10-CM) - S/P knee replacement    THERAPY DIAG:  Chronic pain of right knee  Muscle weakness (generalized)  Difficulty in walking, not elsewhere classified  Rationale for Evaluation and Treatment: Rehabilitation  ONSET DATE: 08/21/23 surgery  SUBJECTIVE:   SUBJECTIVE STATEMENT: Pt reports she is having mild pain  in her right hip that is affecting her gait. She is getting an injection in her hip soon. Ankle is more painful than knee. Has intermittent right knee buckling however uses SPC most of the time and no SPC in her home.   PERTINENT HISTORY: HTN  PAIN:  Are you having pain? Yes: NPRS scale: 0/10 Pain location: R knee Pain description: ache, discomfort Aggravating factors: Moving, walking Relieving factors: Ice man, tylenol  PRECAUTIONS: Knee  RED FLAGS: None   WEIGHT BEARING RESTRICTIONS: No WBAT  FALLS:  Has patient fallen in last 6 months? No  LIVING ENVIRONMENT: Lives with: lives with their family Lives in:  House/apartment Stairs: Yes: External: 14 steps; can reach both   OCCUPATION: Not working  PLOF: Independent with household mobility with device and Independent with community mobility with device  PATIENT GOALS: Good use of my R knee  NEXT MD VISIT: 09/08/23  OBJECTIVE:   PATIENT SURVEYS:  FOTO: Perceived function   39%, predicted   51%   10/07/23: 54%   COGNITION: Overall cognitive status: Within functional limits for tasks assessed     SENSATION: WFL  EDEMA:  Swelling of the R knee  MUSCLE LENGTH: Hamstrings: Right NT deg; Left NT deg Thomas test: Right NT deg; Left NT deg  POSTURE: No Significant postural limitations  PALPATION: TTP of the peri-R knee and thigh  LOWER EXTREMITY ROM:  Active assisted ROM Right eval Left eval Right  Right 09/07/23 Right  09/14/23 Right 09/21/23 Right 09/29/23 Right 10/12/23 10/23/23 RT 11/18/23:  RT  Hip flexion            Hip extension            Hip abduction            Hip adduction            Hip internal rotation            Hip external rotation                        Knee flexion 80  98 AA 107 A 108 AA  117  120 A   Knee extension 10 lacking    -20 quad lag -20 -20 -15 -12 -10  Ankle dorsiflexion            Ankle plantarflexion            Ankle inversion            Ankle eversion            11/04/23= -12 for quad lag c NMES. 11/11/23= -15 s NMES   (Blank rows = not tested)  LOWER EXTREMITY MMT:  MMT Right eval Left eval RT  Hip flexion 2  3  Hip extension     Hip abduction 2  3  Hip adduction     Hip internal rotation 2    Hip external rotation 2  3  Knee flexion 2  3  Knee extension 2  3-  Ankle dorsiflexion     Ankle plantarflexion     Ankle inversion     Ankle eversion      (Blank rows = not tested)  LOWER EXTREMITY SPECIAL TESTS:  NT  FUNCTIONAL TESTS:  Complete and 5xSTS when pt is no longer needing an assist device with walking 2 MWT with RW 186 Feet   GAIT: Distance walked:  137ft Assistive device utilized: Environmental consultant - 2 wheeled Level  of assistance: Complete Independence Comments: Step to pattern c L LE   TODAY'S TREATMENT:  [facility] Adult PT Treatment:                                                DATE: 11/18/23 Therapeutic Exercise: SLR with initial QS Right TKE ball on wall Wall squats 10 sec x 10 Leg press 25# R 10 x 2  Estim Attended:  NMES 25 mA x 15 minutes including set up, right quad, 10 sec on/ 10 sec off concurrent with:  SAQ x 3 min c 3# SLR x 3 min c 3# LAQ x 3 min c 3#    [facility] Adult PT Treatment:                                                DATE: 11/11/23 Therapeutic Exercise: Wall squat slides to 60d 2x10 Lateral side step ups 2x10 6", veral cue for control STS x10 Standing hip abd, ext, knee curl x15 each 4x Neuromuscular re-ed: E stim attended: NMES 25 mA x 15 minutes including set up, right quad, 10 sec on/ 10 sec off concurrent with:  SAQ x 5 min c 3# SLR x 5 min c 3# LAQ x 5 min c 3# Therapeutic Activity: Gait training: With a SPC 455 ft and SBA. Verbal cueing was provided for proper sequencing of the [facility]. Pt demonstrates decreased hip stability c the [facility]  [facility] Adult PT Treatment:                                                DATE: 11/06/23 Therapeutic Exercise: Wall squat slides to 60d 2x10 Lateral side step ups 2x10 6", veral cue for control STS x10 Standing hip abd, ext, knee curl x15 each 4x Neuromuscular re-ed: E stim attended: NMES 25 mA x 20 minutes including set up, right quad, 10 sec on/ 10 sec off concurrent with:  SAQ x 5 min c 3# SLR x 5 min c 3# LAQ x 5 min c 3#  [facility] Adult PT Treatment:                                                DATE: 11/04/23 Therapeutic Exercise: Nustep L6 LE only x 10 minutes for knee extension strengthening Right hip flexor stretch with strap   Neuromuscular re-ed: E stim attended: NMES 30.5 mA x 20 minutes including set up  , right quad, 10 sec on/ 10 sec off concurrent with:   SAQ x 5 min SLR x 5 min LAQ x 5 min    PATIENT EDUCATION:  Education details: Eval findings, POC, HEP, self care  Person educated: Patient Education method: Explanation, Demonstration, Tactile cues, Verbal cues, and Handouts Education comprehension: verbalized understanding, returned demonstration, verbal cues required, tactile cues required, and needs further education  HOME EXERCISE PROGRAM: Access Code: [email or web address] URL: [email or web address] Date: 08/28/2023 Prepared by: Joellyn Rued  Exercises - Supine Quad Set  -  3 x daily - 7 x weekly - 1 sets - 10 reps - 5 hold - Supine Heel Slide with Strap  - 3 x daily - 7 x weekly - 1 sets - 10 reps - 5 hold - Supine Knee Extension Strengthening  - 3 x daily - 7 x weekly - 1 sets - 10 reps - 3 hold - Sit to Stand with Counter Support  - 3 x daily - 7 x weekly - 1 sets - 5 reps - Sitting Knee Extension with Resistance  - 1 x daily - 7 x weekly - 2 sets - 10 reps - Seated Hamstring Curl with Anchored Resistance  - 1 x daily - 7 x weekly - 2 sets - 10 reps - Standing Terminal Knee Extension with Resistance  - 1 x daily - 7 x weekly - 2 sets - 10 reps - 5 hold  ASSESSMENT:  CLINICAL IMPRESSION: R Quad strengthening c NMES was continued. R quad lag is improved to 10d lacking. She arrives with [facility] and reports intermittent buckling of right knee without Fall.  Additionally, strengthening for both LEs was completed. Will continue to work on R quad/LE strengthening to more closely match the L. Additionally, will continue gait training c the [facility] and progress walking tolerance as appropriate. Pt tolerated PT today without adverse effects. Pt will continue to benefit from skilled PT to address impairments for improved R knee/LE function.    RE-EVAL: Pt is making progress re: R knee AROM, strength, and function. Regaining quad strength has been slower than anticipated, but pt is beginning to show improved strength as per her quad lag measure.  Functionally, pt is continuing to need a RW for an assist device due to her R quad weakness, but she is finding her function with daily activities is improving as per her FOTO functional ability score. Pt will continue to benefit from skilled PT 2w6 to address impairments for improved R knee/LE function progressing pt's walking to a SPC as her R knee/leg strength improves. Will utilize BFR and or [place] stim to assist with strengthening as indicated.    EVAL: Patient is a 62 y.o. female who was seen today for physical therapy evaluation and treatment for  R53.1 (ICD-10-CM) - Weakness  Z96.659 (ICD-10-CM) - S/P knee replacement  .Pt presents to PT walking c a RW, WBAT R LE, mod Ind. For 1 week s/p R TKR surgery, the pt's demonstrates good AAROM. Strength is limited with pt not being able to complete a SAQ or SLR. A HEP was initiated to address R knee/LE ROM and strength with pt returning demonstration. Pt will benefit from skilled PT 2w8 to address impairments to optimize function with less pain.   OBJECTIVE IMPAIRMENTS: decreased activity tolerance, decreased mobility, difficulty walking, decreased ROM, decreased strength, increased edema, and pain.   ACTIVITY LIMITATIONS: carrying, lifting, bending, sitting, standing, squatting, sleeping, stairs, transfers, bed mobility, bathing, toileting, dressing, self feeding, hygiene/grooming, locomotion level, and caring for others  PARTICIPATION LIMITATIONS: meal prep, cleaning, laundry, driving, shopping, and community activity  PERSONAL FACTORS: Past/current experiences and Time since onset of injury/illness/exacerbation are also affecting patient's functional outcome.   REHAB POTENTIAL: Excellent  CLINICAL DECISION MAKING: Stable/uncomplicated  EVALUATION COMPLEXITY: Low   GOALS:  SHORT TERM GOALS: Target date: 09/18/23 Pt will be Ind in an initial HEP  Baseline: started Goal status: MET  2.  Increase L knee AROM to 5-100d to improve R knee  function Baseline: 10-80d 09/07/23: significant quad lag Goal  status: PARTIALLY MET (for flexion)   3.  Pt will be able to complete a SAQ and SLR independently Baseline: unable 09/07/23: unable 09/14/23: can complete with -20 d lag 10/12/23: -15 quad lag Goal status: ONGOING  LONG TERM GOALS: Target date: 12/04/23  Pt will be Ind in a final HEP to maintain achieved LOF Baseline: started Goal status: INITIAL  2.  Increase L knee AROM to 0-115d to for appropriate R knee function Baseline: 10-80d 09/29/23: 20-117 10/01/23: c quad set 3d lacking, with SLR -20d quad lag 10/12/23: SLR -15d quad lag Goal status: ONGOING  3.  Increased R hip and knee strength to 4/5 or greater for appropriate R knee function Baseline: See flow sheets Goal status: IMPROVED  4.  Pt will be able to walk 862ft Indly and asc/dsc 12 steps c a HR and mod Ind for community mobility  Baseline:     11/10/23: 455'  Goal status: ONGOING  5.  Improve 5xSTS by MCID of 5" and by MCID of 58ft as indication of improved functional mobility  Baseline: Complete and 5xSTS when pt is no longer needing an assist device with walking 10/05/23: 2 MWT 186 feet with RW; 5 x STS 15.2 sec Goal status: ONGOING  6.  Pt's FOTO score will improved to the predicted value of 51% as indication of improved function  Baseline: 39%.  10/07/23=54% Goal status: MET   PLAN:  PT FREQUENCY: 2x/week  PT DURATION: 8 weeks  PLANNED INTERVENTIONS: Therapeutic exercises, Therapeutic activity, Neuromuscular re-education, Balance training, Gait training, Patient/Family education, Self Care, Joint mobilization, Stair training, Aquatic Therapy, Dry Needling, Electrical stimulation, Cryotherapy, Moist heat, Taping, Vasopneumatic device, Ultrasound, Ionotophoresis 4mg /ml Dexamethasone, Manual therapy, and Re-evaluation  PLAN FOR NEXT SESSION: FOTO status; assess response to HEP; progress therex as indicated; use of modalities, manual  therapy; and TPDN as indicated. STRENGTHEN HIP   Jannette Spanner, PTA 11/18/23 11:16 AM Phone: [phone] Fax: [phone]

## 2023-11-18 NOTE — Therapy (Signed)
OUTPATIENT PHYSICAL THERAPY LOWER EXTREMITY TREATMENT   Patient Name: Sandra Lozano MRN: [id number] DOB:July 25, 1961, 62 y.o., female Today's Date: 11/20/2023  END OF SESSION:  PT End of Session - 11/20/23 0907     Visit Number 20    Number of Visits 25    Date for PT Re-Evaluation 12/04/23    Authorization Type [place] MEDICAID [facility]    Authorization Time Period Approved 12 visits 10/14/23-12/16/23    Authorization - Visit Number 7    Authorization - Number of Visits 12    PT Start Time [phone]    PT Stop Time 0930    PT Time Calculation (min) 38 min    Activity Tolerance Patient tolerated treatment well    Behavior During Therapy [facility] for tasks assessed/performed                      Past Medical History:  Diagnosis Date   Arthritis    right knee, lower back   Chest wall pain 04/20/2021   Dyspnea    very rare -tx with albuterol neb sol if needed   Hyperlipidemia    Hypertension    Intertrigo 08/06/2022   Pre-diabetes    per patient - does not check blood sugar   Right hip pain 05/05/2018   Rupture of anterior cruciate ligament of right knee 02/15/2018   Sprain of medial collateral ligament of right knee 02/15/2018   SVD (spontaneous vaginal delivery)    x 5 - only 2 living, 1 stillborn and 1 premature at 7 months demise,1 child deceased   Wears dentures    upper and lower   Past Surgical History:  Procedure Laterality Date   FOOT ARTHRODESIS Left 11/02/2020   Procedure: LEFT MIDFOOT FUSION BASE 1ST AND 2ND METATARSAL;  Surgeon: Nadara Mustard, MD;  Location: [facility] OR;  Service: Orthopedics;  Laterality: Left;   LUMBAR FUSION  05/20/2019   GILL PROCEDURE, LEFT TRANSFORAMINAL LUMBAR INTERBODY FUSION, PEDICLE INSTRUMENTATION, BILATERAL FUSION (N/A   TOTAL KNEE ARTHROPLASTY Right 08/21/2023   Procedure: RIGHT TOTAL KNEE ARTHROPLASTY;  Surgeon: Nadara Mustard, MD;  Location: [facility] OR;  Service: Orthopedics;  Laterality: Right;   TUBAL LIGATION     Patient Active  Problem List   Diagnosis Date Noted   Unilateral primary osteoarthritis, right knee 08/21/2023   Total knee replacement status, right 08/21/2023   Hidradenitis suppurativa of right axilla 06/03/2023   Vaginal irritation 02/21/2023   Skin lesion of back 02/21/2023   Tinea 12/18/2022   Left shoulder pain 08/25/2022   DJD (degenerative joint disease), multiple sites 08/06/2022   COPD, mild (HCC) 02/11/2022   Carpal tunnel syndrome, bilateral 12/21/2021   Arthritis of finger of left hand 11/26/2021   Traumatic arthritis of left foot    Lichen simplex chronicus 06/04/2020   Arthritis of midfoot 03/14/2020   History of lumbar fusion 03/14/2020   Well controlled diabetes mellitus (HCC) 11/07/2019   Nummular eczema 07/06/2019   Lumbar stenosis 05/20/2019   Acute left-sided low back pain without sciatica 07/18/2018   Osteoarthritis of right hip 06/03/2018   Chronic right shoulder pain 05/05/2018   Essential hypertension 02/15/2018    PCP: Lockie Mola, MD   REFERRING PROVIDER: Nadara Mustard, MD   REFERRING DIAG:  R53.1 (ICD-10-CM) - Weakness  Z96.659 (ICD-10-CM) - S/P knee replacement    THERAPY DIAG:  Chronic pain of right knee  Muscle weakness (generalized)  Difficulty in walking, not elsewhere classified  Rationale for Evaluation and  Treatment: Rehabilitation  ONSET DATE: 08/21/23 surgery  SUBJECTIVE:   SUBJECTIVE STATEMENT: Pt reports occasional R knee buckling walking with a SPC without falling.  PERTINENT HISTORY: HTN  PAIN:  Are you having pain? Yes: NPRS scale: 0/10 Pain location: R knee Pain description: ache, discomfort Aggravating factors: Moving, walking Relieving factors: Ice man, tylenol  PRECAUTIONS: Knee  RED FLAGS: None   WEIGHT BEARING RESTRICTIONS: No WBAT  FALLS:  Has patient fallen in last 6 months? No  LIVING ENVIRONMENT: Lives with: lives with their family Lives in: House/apartment Stairs: Yes: External: 14 steps; can reach  both   OCCUPATION: Not working  PLOF: Independent with household mobility with device and Independent with community mobility with device  PATIENT GOALS: Good use of my R knee  NEXT MD VISIT: 09/08/23  OBJECTIVE:   PATIENT SURVEYS:  FOTO: Perceived function   39%, predicted   51%   10/07/23: 54%   COGNITION: Overall cognitive status: Within functional limits for tasks assessed     SENSATION: WFL  EDEMA:  Swelling of the R knee  MUSCLE LENGTH: Hamstrings: Right NT deg; Left NT deg Thomas test: Right NT deg; Left NT deg  POSTURE: No Significant postural limitations  PALPATION: TTP of the peri-R knee and thigh  LOWER EXTREMITY ROM:  Active assisted ROM Right eval Left eval Right  Right 09/07/23 Right  09/14/23 Right 09/21/23 Right 09/29/23 Right 10/12/23 10/23/23 RT 11/18/23:  RT  Hip flexion            Hip extension            Hip abduction            Hip adduction            Hip internal rotation            Hip external rotation                        Knee flexion 80  98 AA 107 A 108 AA  117  120 A   Knee extension 10 lacking    -20 quad lag -20 -20 -15 -12 -10  Ankle dorsiflexion            Ankle plantarflexion            Ankle inversion            Ankle eversion            11/04/23= -12 for quad lag c NMES. 11/11/23= -15 s NMES   (Blank rows = not tested)  LOWER EXTREMITY MMT:  MMT Right eval Left eval RT  Hip flexion 2  3  Hip extension     Hip abduction 2  3  Hip adduction     Hip internal rotation 2    Hip external rotation 2  3  Knee flexion 2  3  Knee extension 2  3-  Ankle dorsiflexion     Ankle plantarflexion     Ankle inversion     Ankle eversion      (Blank rows = not tested)  LOWER EXTREMITY SPECIAL TESTS:  NT  FUNCTIONAL TESTS:  Complete and 5xSTS when pt is no longer needing an assist device with walking 2 MWT with RW 186 Feet   SL standing: R 2", R 2"  GAIT: Distance walked: 161ft Assistive device utilized:  Walker - 2 wheeled Level of assistance: Complete Independence Comments: Step to pattern c L LE  TODAY'S TREATMENT:  [facility] Adult PT Treatment:                                                DATE: 11/20/23 Therapeutic Exercise: LAQ 2x10 5# lateral step ups 6" 2x10 Banded side steps 10' x4 Wall squats 10 sec x 10 Leg press 25# R 10 x 2  Estim Attended:  NMES 30 mA x 15 minutes including set up, right quad, 10 sec on/10 sec off concurrent with:  SAQ x 3 min c 3# SLR x 3 min c 3# LAQ x 3 min c 3#  [facility] Adult PT Treatment:                                                DATE: 11/18/23 Therapeutic Exercise: SLR with initial QS Right TKE ball on wall Wall squats 10 sec x 10 Leg press 25# R 10 x 2  Estim Attended:  NMES 25 mA x 15 minutes including set up, right quad, 10 sec on/ 10 sec off concurrent with:  SAQ x 3 min c 3# SLR x 3 min c 3# LAQ x 3 min c 3#  [facility] Adult PT Treatment:                                                DATE: 11/11/23 Therapeutic Exercise: Wall squat slides to 60d 2x10 Lateral side step ups 2x10 6", veral cue for control STS x10 Standing hip abd, ext, knee curl x15 each 4x Neuromuscular re-ed: E stim attended: NMES 25 mA x 15 minutes including set up, right quad, 10 sec on/ 10 sec off concurrent with:  SAQ x 5 min c 3# SLR x 5 min c 3# LAQ x 5 min c 3# Therapeutic Activity: Gait training: With a SPC 455 ft and SBA. Verbal cueing was provided for proper sequencing of the [facility]. Pt demonstrates decreased hip stability c the [facility]   PATIENT EDUCATION:  Education details: Eval findings, POC, HEP, self care  Person educated: Patient Education method: Explanation, Demonstration, Tactile cues, Verbal cues, and Handouts Education comprehension: verbalized understanding, returned demonstration, verbal cues required, tactile cues required, and needs further education  HOME EXERCISE PROGRAM: Access Code: [email or web address] URL: [email or web address] Date:  11/20/2023 Prepared by: Joellyn Rued  Exercises - Supine Quad Set  - 3 x daily - 7 x weekly - 1 sets - 10 reps - 5 hold - Supine Heel Slide with Strap  - 3 x daily - 7 x weekly - 1 sets - 10 reps - 5 hold - Supine Knee Extension Strengthening  - 3 x daily - 7 x weekly - 1 sets - 10 reps - 3 hold - Sit to Stand with Counter Support  - 3 x daily - 7 x weekly - 1 sets - 5 reps - Sitting Knee Extension with Resistance  - 1 x daily - 7 x weekly - 2 sets - 10 reps - Seated Hamstring Curl with Anchored Resistance  - 1 x daily - 7 x weekly - 2 sets - 10 reps - Standing Terminal Knee Extension  with Resistance  - 1 x daily - 7 x weekly - 2 sets - 10 reps - 5 hold - Standing Single Leg Stance with Counter Support  - 1 x daily - 7 x weekly - 1 sets - 5 reps - 30 hold - Standing Tandem Balance with Counter Support  - 1 x daily - 7 x weekly - 1 sets - 5 reps - 30 hold - Side Stepping with Resistance at Thighs  - 1 x daily - 7 x weekly - 3 sets - 20 reps  ASSESSMENT:  CLINICAL IMPRESSION: PT was continued for R Quad strengthening c NMES and bilat LE strengthening for improved stability with gait. Additionally, SL and tandem balance exs were also completed. Pt's SL stance time is 2" R and L. Pt's HEP was updated for these balance exs. Pt continues to walk c a SPC with occasional buckling s a fall. Pt will continue to benefit from skilled PT to address strength and balance impairments for improved R knee/LE function.    RE-EVAL: Pt is making progress re: R knee AROM, strength, and function. Regaining quad strength has been slower than anticipated, but pt is beginning to show improved strength as per her quad lag measure. Functionally, pt is continuing to need a RW for an assist device due to her R quad weakness, but she is finding her function with daily activities is improving as per her FOTO functional ability score. Pt will continue to benefit from skilled PT 2w6 to address impairments for improved R knee/LE  function progressing pt's walking to a SPC as her R knee/leg strength improves. Will utilize BFR and or [place] stim to assist with strengthening as indicated.    EVAL: Patient is a 62 y.o. female who was seen today for physical therapy evaluation and treatment for  R53.1 (ICD-10-CM) - Weakness  Z96.659 (ICD-10-CM) - S/P knee replacement  .Pt presents to PT walking c a RW, WBAT R LE, mod Ind. For 1 week s/p R TKR surgery, the pt's demonstrates good AAROM. Strength is limited with pt not being able to complete a SAQ or SLR. A HEP was initiated to address R knee/LE ROM and strength with pt returning demonstration. Pt will benefit from skilled PT 2w8 to address impairments to optimize function with less pain.   OBJECTIVE IMPAIRMENTS: decreased activity tolerance, decreased mobility, difficulty walking, decreased ROM, decreased strength, increased edema, and pain.   ACTIVITY LIMITATIONS: carrying, lifting, bending, sitting, standing, squatting, sleeping, stairs, transfers, bed mobility, bathing, toileting, dressing, self feeding, hygiene/grooming, locomotion level, and caring for others  PARTICIPATION LIMITATIONS: meal prep, cleaning, laundry, driving, shopping, and community activity  PERSONAL FACTORS: Past/current experiences and Time since onset of injury/illness/exacerbation are also affecting patient's functional outcome.   REHAB POTENTIAL: Excellent  CLINICAL DECISION MAKING: Stable/uncomplicated  EVALUATION COMPLEXITY: Low   GOALS:  SHORT TERM GOALS: Target date: 09/18/23 Pt will be Ind in an initial HEP  Baseline: started Goal status: MET  2.  Increase L knee AROM to 5-100d to improve R knee function Baseline: 10-80d 09/07/23: significant quad lag Goal status: PARTIALLY MET (for flexion)   3.  Pt will be able to complete a SAQ and SLR independently Baseline: unable 09/07/23: unable 09/14/23: can complete with -20 d lag 10/12/23: -15 quad lag Goal status: ONGOING  LONG TERM  GOALS: Target date: 12/04/23  Pt will be Ind in a final HEP to maintain achieved LOF Baseline: started Goal status: INITIAL  2.  Increase L knee AROM to  0-115d to for appropriate R knee function Baseline: 10-80d 09/29/23: 20-117 10/01/23: c quad set 3d lacking, with SLR -20d quad lag 10/12/23: SLR -15d quad lag Goal status: ONGOING  3.  Increased R hip and knee strength to 4/5 or greater for appropriate R knee function Baseline: See flow sheets Goal status: IMPROVED  4.  Pt will be able to walk 884ft Indly and asc/dsc 12 steps c a HR and mod Ind for community mobility  Baseline:     11/10/23: 455'  Goal status: ONGOING  5.  Improve 5xSTS by MCID of 5" and by MCID of 2ft as indication of improved functional mobility  Baseline: Complete and 5xSTS when pt is no longer needing an assist device with walking 10/05/23: 2 MWT 186 feet with RW; 5 x STS 15.2 sec Goal status: ONGOING  6.  Pt's FOTO score will improved to the predicted value of 51% as indication of improved function  Baseline: 39%.  10/07/23=54% Goal status: MET   PLAN:  PT FREQUENCY: 2x/week  PT DURATION: 8 weeks  PLANNED INTERVENTIONS: Therapeutic exercises, Therapeutic activity, Neuromuscular re-education, Balance training, Gait training, Patient/Family education, Self Care, Joint mobilization, Stair training, Aquatic Therapy, Dry Needling, Electrical stimulation, Cryotherapy, Moist heat, Taping, Vasopneumatic device, Ultrasound, Ionotophoresis 4mg /ml Dexamethasone, Manual therapy, and Re-evaluation  PLAN FOR NEXT SESSION: FOTO status; assess response to HEP; progress therex as indicated; use of modalities, manual therapy; and TPDN as indicated. STRENGTHEN HIP   Joellyn Rued MS, PT 11/20/23 10:04 AM

## 2023-11-20 ENCOUNTER — Ambulatory Visit: Payer: Medicaid Other

## 2023-11-20 DIAGNOSIS — R262 Difficulty in walking, not elsewhere classified: Secondary | ICD-10-CM | POA: Diagnosis not present

## 2023-11-20 DIAGNOSIS — M25561 Pain in right knee: Secondary | ICD-10-CM | POA: Diagnosis not present

## 2023-11-20 DIAGNOSIS — G8929 Other chronic pain: Secondary | ICD-10-CM | POA: Diagnosis not present

## 2023-11-20 DIAGNOSIS — M6281 Muscle weakness (generalized): Secondary | ICD-10-CM | POA: Diagnosis not present

## 2023-11-23 ENCOUNTER — Other Ambulatory Visit: Payer: Self-pay | Admitting: Family Medicine

## 2023-11-23 DIAGNOSIS — G5601 Carpal tunnel syndrome, right upper limb: Secondary | ICD-10-CM

## 2023-11-24 NOTE — Therapy (Signed)
OUTPATIENT PHYSICAL THERAPY LOWER EXTREMITY TREATMENT   Patient Name: Sandra Lozano MRN: [id number] DOB:May 30, 1961, 62 y.o., female Today's Date: 11/25/2023  END OF SESSION:  PT End of Session - 11/25/23 0913     Visit Number 21    Number of Visits 25    Date for PT Re-Evaluation 12/04/23    Authorization Type [place] MEDICAID [facility]    Authorization Time Period Approved 12 visits 10/14/23-12/16/23    Authorization - Visit Number 8    Authorization - Number of Visits 12    PT Start Time 0845    PT Stop Time 0930    PT Time Calculation (min) 45 min    Activity Tolerance Patient tolerated treatment well    Behavior During Therapy [facility] for tasks assessed/performed                       Past Medical History:  Diagnosis Date   Arthritis    right knee, lower back   Chest wall pain 04/20/2021   Dyspnea    very rare -tx with albuterol neb sol if needed   Hyperlipidemia    Hypertension    Intertrigo 08/06/2022   Pre-diabetes    per patient - does not check blood sugar   Right hip pain 05/05/2018   Rupture of anterior cruciate ligament of right knee 02/15/2018   Sprain of medial collateral ligament of right knee 02/15/2018   SVD (spontaneous vaginal delivery)    x 5 - only 2 living, 1 stillborn and 1 premature at 7 months demise,1 child deceased   Wears dentures    upper and lower   Past Surgical History:  Procedure Laterality Date   FOOT ARTHRODESIS Left 11/02/2020   Procedure: LEFT MIDFOOT FUSION BASE 1ST AND 2ND METATARSAL;  Surgeon: Nadara Mustard, MD;  Location: [facility] OR;  Service: Orthopedics;  Laterality: Left;   LUMBAR FUSION  05/20/2019   GILL PROCEDURE, LEFT TRANSFORAMINAL LUMBAR INTERBODY FUSION, PEDICLE INSTRUMENTATION, BILATERAL FUSION (N/A   TOTAL KNEE ARTHROPLASTY Right 08/21/2023   Procedure: RIGHT TOTAL KNEE ARTHROPLASTY;  Surgeon: Nadara Mustard, MD;  Location: [facility] OR;  Service: Orthopedics;  Laterality: Right;   TUBAL LIGATION     Patient Active  Problem List   Diagnosis Date Noted   Unilateral primary osteoarthritis, right knee 08/21/2023   Total knee replacement status, right 08/21/2023   Hidradenitis suppurativa of right axilla 06/03/2023   Vaginal irritation 02/21/2023   Skin lesion of back 02/21/2023   Tinea 12/18/2022   Left shoulder pain 08/25/2022   DJD (degenerative joint disease), multiple sites 08/06/2022   COPD, mild (HCC) 02/11/2022   Carpal tunnel syndrome, bilateral 12/21/2021   Arthritis of finger of left hand 11/26/2021   Traumatic arthritis of left foot    Lichen simplex chronicus 06/04/2020   Arthritis of midfoot 03/14/2020   History of lumbar fusion 03/14/2020   Well controlled diabetes mellitus (HCC) 11/07/2019   Nummular eczema 07/06/2019   Lumbar stenosis 05/20/2019   Acute left-sided low back pain without sciatica 07/18/2018   Osteoarthritis of right hip 06/03/2018   Chronic right shoulder pain 05/05/2018   Essential hypertension 02/15/2018    PCP: Lockie Mola, MD   REFERRING PROVIDER: Nadara Mustard, MD   REFERRING DIAG:  R53.1 (ICD-10-CM) - Weakness  Z96.659 (ICD-10-CM) - S/P knee replacement    THERAPY DIAG:  Chronic pain of right knee  Muscle weakness (generalized)  Difficulty in walking, not elsewhere classified  Rationale for Evaluation  and Treatment: Rehabilitation  ONSET DATE: 08/21/23 surgery  SUBJECTIVE:   SUBJECTIVE STATEMENT: Pt reports occasional R knee buckling walking with a SPC without falling.  PERTINENT HISTORY: HTN  PAIN:  Are you having pain? Yes: NPRS scale: 0/10 Pain location: R knee Pain description: ache, discomfort Aggravating factors: Moving, walking Relieving factors: Ice man, tylenol  PRECAUTIONS: Knee  RED FLAGS: None   WEIGHT BEARING RESTRICTIONS: No WBAT  FALLS:  Has patient fallen in last 6 months? No  LIVING ENVIRONMENT: Lives with: lives with their family Lives in: House/apartment Stairs: Yes: External: 14 steps; can reach  both   OCCUPATION: Not working  PLOF: Independent with household mobility with device and Independent with community mobility with device  PATIENT GOALS: Good use of my R knee  NEXT MD VISIT: 09/08/23  OBJECTIVE:   PATIENT SURVEYS:  FOTO: Perceived function   39%, predicted   51%   10/07/23: 54%   COGNITION: Overall cognitive status: Within functional limits for tasks assessed     SENSATION: WFL  EDEMA:  Swelling of the R knee  MUSCLE LENGTH: Hamstrings: Right NT deg; Left NT deg Thomas test: Right NT deg; Left NT deg  POSTURE: No Significant postural limitations  PALPATION: TTP of the peri-R knee and thigh  LOWER EXTREMITY ROM:  Active assisted ROM Right eval Left eval Right  Right 09/07/23 Right  09/14/23 Right 09/21/23 Right 09/29/23 Right 10/12/23 10/23/23 RT 11/18/23:  RT  Hip flexion            Hip extension            Hip abduction            Hip adduction            Hip internal rotation            Hip external rotation                        Knee flexion 80  98 AA 107 A 108 AA  117  120 A   Knee extension 10 lacking    -20 quad lag -20 -20 -15 -12 -10  Ankle dorsiflexion            Ankle plantarflexion            Ankle inversion            Ankle eversion            11/04/23= -12 for quad lag c NMES. 11/11/23= -15 s NMES; 11/25/23=-12 s NMES   (Blank rows = not tested)  LOWER EXTREMITY MMT:  MMT Right eval Left eval RT  Hip flexion 2  3  Hip extension     Hip abduction 2  3  Hip adduction     Hip internal rotation 2    Hip external rotation 2  3  Knee flexion 2  3  Knee extension 2  3-  Ankle dorsiflexion     Ankle plantarflexion     Ankle inversion     Ankle eversion      (Blank rows = not tested)  LOWER EXTREMITY SPECIAL TESTS:  NT  FUNCTIONAL TESTS:  Complete and 5xSTS when pt is no longer needing an assist device with walking 2 MWT with RW 186 Feet   SL standing: R 2", R 2"  GAIT: Distance walked: 124ft Assistive  device utilized: Walker - 2 wheeled Level of assistance: Complete Independence Comments: Step to  pattern c L LE   TODAY'S TREATMENT: [facility] Adult PT Treatment:                                                DATE: 11/25/23 Therapeutic Exercise: lateral step ups 6" 2x10 STS 2x10 20# SL stands Banded side steps 10' x6 Leg press 25# R 10 x 2  Estim Attended:  NMES 30 mA x 15 minutes including set up, right quad, 10 sec on/10 sec off concurrent with:  SAQ x 3 min c 5# SLR x 3 min c 3# LAQ x 3 min c 5#  [facility] Adult PT Treatment:                                                DATE: 11/20/23 Therapeutic Exercise: LAQ 2x10 5# lateral step ups 6" 2x10 Banded side steps 10' x4 Wall squats 10 sec x 10 Leg press 25# R 10 x 2  Estim Attended:  NMES 30 mA x 15 minutes including set up, right quad, 10 sec on/10 sec off concurrent with:  SAQ x 3 min c 3# SLR x 3 min c 3# LAQ x 3 min c 3#  [facility] Adult PT Treatment:                                                DATE: 11/18/23 Therapeutic Exercise: SLR with initial QS Right TKE ball on wall Wall squats 10 sec x 10 Leg press 25# R 10 x 2  Estim Attended:  NMES 25 mA x 15 minutes including set up, right quad, 10 sec on/ 10 sec off concurrent with:  SAQ x 3 min c 3# SLR x 3 min c 3# LAQ x 3 min c 3#  [facility] Adult PT Treatment:                                                DATE: 11/11/23 Therapeutic Exercise: Wall squat slides to 60d 2x10 Lateral side step ups 2x10 6", veral cue for control STS x10 Standing hip abd, ext, knee curl x15 each 4x Neuromuscular re-ed: E stim attended: NMES 25 mA x 15 minutes including set up, right quad, 10 sec on/ 10 sec off concurrent with:  SAQ x 5 min c 3# SLR x 5 min c 3# LAQ x 5 min c 3# Therapeutic Activity: Gait training: With a SPC 455 ft and SBA. Verbal cueing was provided for proper sequencing of the [facility]. Pt demonstrates decreased hip stability c the [facility]   PATIENT EDUCATION:  Education details:  Eval findings, POC, HEP, self care  Person educated: Patient Education method: Explanation, Demonstration, Tactile cues, Verbal cues, and Handouts Education comprehension: verbalized understanding, returned demonstration, verbal cues required, tactile cues required, and needs further education  HOME EXERCISE PROGRAM: Access Code: [email or web address] URL: [email or web address] Date: 11/20/2023 Prepared by: Joellyn Rued  Exercises - Supine Quad Set  - 3 x daily - 7 x weekly - 1  sets - 10 reps - 5 hold - Supine Heel Slide with Strap  - 3 x daily - 7 x weekly - 1 sets - 10 reps - 5 hold - Supine Knee Extension Strengthening  - 3 x daily - 7 x weekly - 1 sets - 10 reps - 3 hold - Sit to Stand with Counter Support  - 3 x daily - 7 x weekly - 1 sets - 5 reps - Sitting Knee Extension with Resistance  - 1 x daily - 7 x weekly - 2 sets - 10 reps - Seated Hamstring Curl with Anchored Resistance  - 1 x daily - 7 x weekly - 2 sets - 10 reps - Standing Terminal Knee Extension with Resistance  - 1 x daily - 7 x weekly - 2 sets - 10 reps - 5 hold - Standing Single Leg Stance with Counter Support  - 1 x daily - 7 x weekly - 1 sets - 5 reps - 30 hold - Standing Tandem Balance with Counter Support  - 1 x daily - 7 x weekly - 1 sets - 5 reps - 30 hold - Side Stepping with Resistance at Thighs  - 1 x daily - 7 x weekly - 3 sets - 20 reps  ASSESSMENT:  CLINICAL IMPRESSION: PT was continued for R Quad strengthening c NMES and bilat LE strengthening for improved stability with gait. Quad lag of the R knee continues to improve, -12d. Bilat hip weakness continues to impact pt's gait quality, trendelenburg gait pattern. Single leg stance time is limited bilat. Pt denies any falls c use of a SPC.Pt tolerated PT today without adverse effects. Will continue to work on bilat LE strength to address gait stability and quality.  RE-EVAL: Pt is making progress re: R knee AROM, strength, and function. Regaining quad  strength has been slower than anticipated, but pt is beginning to show improved strength as per her quad lag measure. Functionally, pt is continuing to need a RW for an assist device due to her R quad weakness, but she is finding her function with daily activities is improving as per her FOTO functional ability score. Pt will continue to benefit from skilled PT 2w6 to address impairments for improved R knee/LE function progressing pt's walking to a SPC as her R knee/leg strength improves. Will utilize BFR and or [place] stim to assist with strengthening as indicated.    EVAL: Patient is a 62 y.o. female who was seen today for physical therapy evaluation and treatment for  R53.1 (ICD-10-CM) - Weakness  Z96.659 (ICD-10-CM) - S/P knee replacement  .Pt presents to PT walking c a RW, WBAT R LE, mod Ind. For 1 week s/p R TKR surgery, the pt's demonstrates good AAROM. Strength is limited with pt not being able to complete a SAQ or SLR. A HEP was initiated to address R knee/LE ROM and strength with pt returning demonstration. Pt will benefit from skilled PT 2w8 to address impairments to optimize function with less pain.   OBJECTIVE IMPAIRMENTS: decreased activity tolerance, decreased mobility, difficulty walking, decreased ROM, decreased strength, increased edema, and pain.   ACTIVITY LIMITATIONS: carrying, lifting, bending, sitting, standing, squatting, sleeping, stairs, transfers, bed mobility, bathing, toileting, dressing, self feeding, hygiene/grooming, locomotion level, and caring for others  PARTICIPATION LIMITATIONS: meal prep, cleaning, laundry, driving, shopping, and community activity  PERSONAL FACTORS: Past/current experiences and Time since onset of injury/illness/exacerbation are also affecting patient's functional outcome.   REHAB POTENTIAL: Excellent  CLINICAL DECISION MAKING:  Stable/uncomplicated  EVALUATION COMPLEXITY: Low   GOALS:  SHORT TERM GOALS: Target date: 09/18/23 Pt will be  Ind in an initial HEP  Baseline: started Goal status: MET  2.  Increase L knee AROM to 5-100d to improve R knee function Baseline: 10-80d 09/07/23: significant quad lag Goal status: PARTIALLY MET (for flexion)   3.  Pt will be able to complete a SAQ and SLR independently Baseline: unable 09/07/23: unable 09/14/23: can complete with -20 d lag 10/12/23: -15 quad lag Goal status: ONGOING  LONG TERM GOALS: Target date: 12/04/23  Pt will be Ind in a final HEP to maintain achieved LOF Baseline: started Goal status: INITIAL  2.  Increase L knee AROM to 0-115d to for appropriate R knee function Baseline: 10-80d 09/29/23: 20-117 10/01/23: c quad set 3d lacking, with SLR -20d quad lag 10/12/23: SLR -15d quad lag 11/25/23: -12 quad lag Goal status: ONGOING  3.  Increased R hip and knee strength to 4/5 or greater for appropriate R knee function Baseline: See flow sheets Goal status: IMPROVED  4.  Pt will be able to walk 822ft Indly and asc/dsc 12 steps c a HR and mod Ind for community mobility  Baseline:     11/10/23: 455'  Goal status: ONGOING  5.  Improve 5xSTS by MCID of 5" and by MCID of 25ft as indication of improved functional mobility  Baseline: Complete and 5xSTS when pt is no longer needing an assist device with walking 10/05/23: 2 MWT 186 feet with RW; 5 x STS 15.2 sec Goal status: ONGOING  6.  Pt's FOTO score will improved to the predicted value of 51% as indication of improved function  Baseline: 39%.  10/07/23=54% Goal status: MET   PLAN:  PT FREQUENCY: 2x/week  PT DURATION: 8 weeks  PLANNED INTERVENTIONS: Therapeutic exercises, Therapeutic activity, Neuromuscular re-education, Balance training, Gait training, Patient/Family education, Self Care, Joint mobilization, Stair training, Aquatic Therapy, Dry Needling, Electrical stimulation, Cryotherapy, Moist heat, Taping, Vasopneumatic device, Ultrasound, Ionotophoresis 4mg /ml Dexamethasone, Manual therapy,  and Re-evaluation  PLAN FOR NEXT SESSION: FOTO status; assess response to HEP; progress therex as indicated; use of modalities, manual therapy; and TPDN as indicated. STRENGTHEN HIP   Joellyn Rued MS, PT 11/25/23 10:48 AM

## 2023-11-25 ENCOUNTER — Ambulatory Visit: Payer: Medicaid Other

## 2023-11-25 DIAGNOSIS — R262 Difficulty in walking, not elsewhere classified: Secondary | ICD-10-CM

## 2023-11-25 DIAGNOSIS — M6281 Muscle weakness (generalized): Secondary | ICD-10-CM | POA: Diagnosis not present

## 2023-11-25 DIAGNOSIS — G8929 Other chronic pain: Secondary | ICD-10-CM

## 2023-11-25 DIAGNOSIS — M25561 Pain in right knee: Secondary | ICD-10-CM | POA: Diagnosis not present

## 2023-11-26 ENCOUNTER — Other Ambulatory Visit: Payer: Self-pay | Admitting: Family Medicine

## 2023-11-26 ENCOUNTER — Ambulatory Visit: Payer: Medicaid Other | Admitting: Family Medicine

## 2023-11-26 VITALS — BP 134/68 | HR 63 | Ht 64.0 in | Wt 180.0 lb

## 2023-11-26 DIAGNOSIS — L989 Disorder of the skin and subcutaneous tissue, unspecified: Secondary | ICD-10-CM

## 2023-11-26 DIAGNOSIS — G5601 Carpal tunnel syndrome, right upper limb: Secondary | ICD-10-CM

## 2023-11-26 DIAGNOSIS — J029 Acute pharyngitis, unspecified: Secondary | ICD-10-CM | POA: Diagnosis not present

## 2023-11-26 LAB — POC SOFIA 2 FLU + SARS ANTIGEN FIA
Influenza A, POC: NEGATIVE
Influenza B, POC: NEGATIVE
SARS Coronavirus 2 Ag: NEGATIVE

## 2023-11-26 NOTE — Assessment & Plan Note (Signed)
There now appears to be another lesion on the right side of her back.  Given persistence and history of similar lesions, will refer to dermatology clinic for likely biopsy.  Consider discoid lupus versus mycosis fungoides versus tinea incognito.

## 2023-11-26 NOTE — Patient Instructions (Addendum)
You have a viral infection.  We have swabbed for flu and COVID.  Continue to stay hydrated.  You can take Tylenol Motrin as needed.  Continue to use honey and warm teas for throat relief.  Be sure to come back if not getting any better.  I will get you scheduled for the dermatology clinic for your back lesion.  They will call you to schedule a time.

## 2023-11-26 NOTE — Progress Notes (Signed)
    SUBJECTIVE:   CHIEF COMPLAINT / HPI:   Pain with swallowing Had pain with swallowing for the last week.  Slowly getting worse.  Has also been congested.  No cough.  No fevers.  No nausea or vomiting.  Has been around sick contacts.  Back lesion Had similar lesion on left back previously.  Now has on right back.  Feels it is in an inflammatory phase currently.  It is itchy but she is unable to scratch back there.  Previously seen for this on 02/04/23 and 02/18/2023 and had tried high potency triamcinolone as well as antifungal without improvement.  PERTINENT  PMH / PSH: Hypertension, COPD, carpal tunnel, OA, nummular eczema, lichen simplex chronicus, HFS, DJD, diabetes  OBJECTIVE:   BP 134/68   Pulse 63   Ht 5\' 4"  (1.626 m)   Wt 180 lb (81.6 kg)   LMP  (LMP Unknown)   SpO2 100%   BMI 30.90 kg/m   General: Alert and oriented, in NAD Skin: Warm, dry, and intact; splotchy hyperpigmented lesion on right upper back with mild overlying scale HEENT: NCAT, EOM grossly normal, midline nasal septum Cardiac: RRR, no m/r/g appreciated Respiratory: CTAB, breathing and speaking comfortably on RA, no stridor Extremities: Moves all extremities grossly equally Neurological: No gross focal deficit Psychiatric: Appropriate mood and affect    11/26/23   02/18/2023   02/04/2023  ASSESSMENT/PLAN:   Viral pharyngitis Likely etiology of sore throat in setting of sick contacts.  Reassured by exam.  Flu and COVID testing negative.  Recommended Tylenol, Motrin, honey and warm tea.  Discussed returning if symptoms worsening or not improving.  Skin lesion of back There now appears to be another lesion on the right side of her back.  Given persistence and history of similar lesions, will refer to dermatology clinic for likely biopsy.  Consider discoid lupus versus mycosis fungoides versus tinea incognito.  Janeal Holmes, MD [facility]

## 2023-11-27 ENCOUNTER — Ambulatory Visit: Payer: Medicaid Other

## 2023-12-01 ENCOUNTER — Encounter: Payer: Self-pay | Admitting: Physical Therapy

## 2023-12-01 ENCOUNTER — Telehealth: Payer: Self-pay | Admitting: Family Medicine

## 2023-12-01 ENCOUNTER — Ambulatory Visit: Payer: Medicaid Other | Admitting: Physical Therapy

## 2023-12-01 DIAGNOSIS — G5601 Carpal tunnel syndrome, right upper limb: Secondary | ICD-10-CM

## 2023-12-01 DIAGNOSIS — M6281 Muscle weakness (generalized): Secondary | ICD-10-CM

## 2023-12-01 DIAGNOSIS — G8929 Other chronic pain: Secondary | ICD-10-CM

## 2023-12-01 DIAGNOSIS — M25561 Pain in right knee: Secondary | ICD-10-CM | POA: Diagnosis not present

## 2023-12-01 DIAGNOSIS — R262 Difficulty in walking, not elsewhere classified: Secondary | ICD-10-CM | POA: Diagnosis not present

## 2023-12-01 MED ORDER — MELOXICAM 15 MG PO TABS
15.0000 mg | ORAL_TABLET | Freq: Every day | ORAL | 0 refills | Status: DC
Start: 2023-12-01 — End: 2023-12-28

## 2023-12-01 NOTE — Telephone Encounter (Signed)
Patient calls nurse line in regards to Meloxicam.   She reports this medication helps a lot with the arthritis.   She reports she was told perhaps to stay off of the medication due adverse GI symptoms.   She reports she wants to continue this and reports no side effects at this time.   Advised with forward to PCP.

## 2023-12-01 NOTE — Therapy (Signed)
OUTPATIENT PHYSICAL THERAPY LOWER EXTREMITY TREATMENT   Patient Name: Sandra Lozano MRN: [id number] DOB:1961/11/08, 62 y.o., female Today's Date: 12/01/2023  END OF SESSION:  PT End of Session - 12/01/23 0805     Visit Number 22    Number of Visits 25    Date for PT Re-Evaluation 12/04/23    Authorization Type [place] MEDICAID [facility]    Authorization Time Period Approved 12 visits 10/14/23-12/16/23    Authorization - Visit Number 9    Authorization - Number of Visits 12    PT Start Time 0800    PT Stop Time 0845    PT Time Calculation (min) 45 min                       Past Medical History:  Diagnosis Date   Arthritis    right knee, lower back   Chest wall pain 04/20/2021   Dyspnea    very rare -tx with albuterol neb sol if needed   Hyperlipidemia    Hypertension    Intertrigo 08/06/2022   Pre-diabetes    per patient - does not check blood sugar   Right hip pain 05/05/2018   Rupture of anterior cruciate ligament of right knee 02/15/2018   Sprain of medial collateral ligament of right knee 02/15/2018   SVD (spontaneous vaginal delivery)    x 5 - only 2 living, 1 stillborn and 1 premature at 7 months demise,1 child deceased   Wears dentures    upper and lower   Past Surgical History:  Procedure Laterality Date   FOOT ARTHRODESIS Left 11/02/2020   Procedure: LEFT MIDFOOT FUSION BASE 1ST AND 2ND METATARSAL;  Surgeon: Nadara Mustard, MD;  Location: [facility] OR;  Service: Orthopedics;  Laterality: Left;   LUMBAR FUSION  05/20/2019   GILL PROCEDURE, LEFT TRANSFORAMINAL LUMBAR INTERBODY FUSION, PEDICLE INSTRUMENTATION, BILATERAL FUSION (N/A   TOTAL KNEE ARTHROPLASTY Right 08/21/2023   Procedure: RIGHT TOTAL KNEE ARTHROPLASTY;  Surgeon: Nadara Mustard, MD;  Location: [facility] OR;  Service: Orthopedics;  Laterality: Right;   TUBAL LIGATION     Patient Active Problem List   Diagnosis Date Noted   Unilateral primary osteoarthritis, right knee 08/21/2023   Total knee  replacement status, right 08/21/2023   Hidradenitis suppurativa of right axilla 06/03/2023   Vaginal irritation 02/21/2023   Skin lesion of back 02/21/2023   Tinea 12/18/2022   Left shoulder pain 08/25/2022   DJD (degenerative joint disease), multiple sites 08/06/2022   COPD, mild (HCC) 02/11/2022   Carpal tunnel syndrome, bilateral 12/21/2021   Arthritis of finger of left hand 11/26/2021   Traumatic arthritis of left foot    Lichen simplex chronicus 06/04/2020   Arthritis of midfoot 03/14/2020   History of lumbar fusion 03/14/2020   Well controlled diabetes mellitus (HCC) 11/07/2019   Nummular eczema 07/06/2019   Lumbar stenosis 05/20/2019   Acute left-sided low back pain without sciatica 07/18/2018   Osteoarthritis of right hip 06/03/2018   Chronic right shoulder pain 05/05/2018   Essential hypertension 02/15/2018    PCP: Lockie Mola, MD   REFERRING PROVIDER: Nadara Mustard, MD   REFERRING DIAG:  R53.1 (ICD-10-CM) - Weakness  Z96.659 (ICD-10-CM) - S/P knee replacement    THERAPY DIAG:  Chronic pain of right knee  Muscle weakness (generalized)  Rationale for Evaluation and Treatment: Rehabilitation  ONSET DATE: 08/21/23 surgery  SUBJECTIVE:   SUBJECTIVE STATEMENT: Pt reports occasional R knee buckling walking with a SPC without falling.  PERTINENT HISTORY: HTN  PAIN:  Are you having pain? Yes: NPRS scale: 0/10 Pain location: R knee Pain description: ache, discomfort Aggravating factors: Moving, walking Relieving factors: Ice man, tylenol  PRECAUTIONS: Knee  RED FLAGS: None   WEIGHT BEARING RESTRICTIONS: No WBAT  FALLS:  Has patient fallen in last 6 months? No  LIVING ENVIRONMENT: Lives with: lives with their family Lives in: House/apartment Stairs: Yes: External: 14 steps; can reach both   OCCUPATION: Not working  PLOF: Independent with household mobility with device and Independent with community mobility with device  PATIENT GOALS:  Good use of my R knee  NEXT MD VISIT: 09/08/23  OBJECTIVE:   PATIENT SURVEYS:  FOTO: Perceived function   39%, predicted   51%   10/07/23: 54%   COGNITION: Overall cognitive status: Within functional limits for tasks assessed     SENSATION: WFL  EDEMA:  Swelling of the R knee  MUSCLE LENGTH: Hamstrings: Right NT deg; Left NT deg Thomas test: Right NT deg; Left NT deg  POSTURE: No Significant postural limitations  PALPATION: TTP of the peri-R knee and thigh  LOWER EXTREMITY ROM:  Active assisted ROM Right eval Left eval Right  Right 09/07/23 Right  09/14/23 Right 09/21/23 Right 09/29/23 Right 10/12/23 10/23/23 RT 11/18/23:  RT  Hip flexion            Hip extension            Hip abduction            Hip adduction            Hip internal rotation            Hip external rotation                        Knee flexion 80  98 AA 107 A 108 AA  117  120 A   Knee extension 10 lacking    -20 quad lag -20 -20 -15 -12 -10  Ankle dorsiflexion            Ankle plantarflexion            Ankle inversion            Ankle eversion            11/04/23= -12 for quad lag c NMES. 11/11/23= -15 s NMES; 11/25/23=-12 s NMES   (Blank rows = not tested)  LOWER EXTREMITY MMT:  MMT Right eval Left eval RT  Hip flexion 2  3  Hip extension     Hip abduction 2  3  Hip adduction     Hip internal rotation 2    Hip external rotation 2  3  Knee flexion 2  3  Knee extension 2  3-  Ankle dorsiflexion     Ankle plantarflexion     Ankle inversion     Ankle eversion      (Blank rows = not tested)  LOWER EXTREMITY SPECIAL TESTS:  NT  FUNCTIONAL TESTS:  Complete and 5xSTS when pt is no longer needing an assist device with walking 2 MWT with RW 186 Feet : 12/01/23: 308 feet without AD  SL standing: R 2", R 2":   12/01/23: 5 x STS 17 sec no UE standard chair  GAIT: Distance walked: 131ft Assistive device utilized: Walker - 2 wheeled Level of assistance: Complete  Independence Comments: Step to pattern c L LE   TODAY'S TREATMENT: [facility] Adult  PT Treatment:                                                DATE: 12/01/23 Therapeutic Exercise: Knee ext 10 # bilat concentric and R eccentric lower  Knee flex 25# bilat conentric and R eccentric  SL Leg press 35#  SLS 12 sec L, 5 sec R  Tandem 12 sec RLE back  6 inch lateral step up with hip abduction x 12 each   Therapeutic Activity: 2 MWT 308 feet 5 x STS 17 sec    [facility] Adult PT Treatment:                                                DATE: 11/25/23 Therapeutic Exercise: lateral step ups 6" 2x10 STS 2x10 20# SL stands Banded side steps 10' x6 Leg press 25# R 10 x 2  Estim Attended:  NMES 30 mA x 15 minutes including set up, right quad, 10 sec on/10 sec off concurrent with:  SAQ x 3 min c 5# SLR x 3 min c 3# LAQ x 3 min c 5#  [facility] Adult PT Treatment:                                                DATE: 11/20/23 Therapeutic Exercise: LAQ 2x10 5# lateral step ups 6" 2x10 Banded side steps 10' x4 Wall squats 10 sec x 10 Leg press 25# R 10 x 2  Estim Attended:  NMES 30 mA x 15 minutes including set up, right quad, 10 sec on/10 sec off concurrent with:  SAQ x 3 min c 3# SLR x 3 min c 3# LAQ x 3 min c 3#  [facility] Adult PT Treatment:                                                DATE: 11/18/23 Therapeutic Exercise: SLR with initial QS Right TKE ball on wall Wall squats 10 sec x 10 Leg press 25# R 10 x 2  Estim Attended:  NMES 25 mA x 15 minutes including set up, right quad, 10 sec on/ 10 sec off concurrent with:  SAQ x 3 min c 3# SLR x 3 min c 3# LAQ x 3 min c 3#  [facility] Adult PT Treatment:                                                DATE: 11/11/23 Therapeutic Exercise: Wall squat slides to 60d 2x10 Lateral side step ups 2x10 6", veral cue for control STS x10 Standing hip abd, ext, knee curl x15 each 4x Neuromuscular re-ed: E stim attended: NMES 25 mA x 15 minutes including set  up, right quad, 10 sec on/ 10 sec off concurrent with:  SAQ x 5 min c 3# SLR x 5 min c  3# LAQ x 5 min c 3# Therapeutic Activity: Gait training: With a SPC 455 ft and SBA. Verbal cueing was provided for proper sequencing of the [facility]. Pt demonstrates decreased hip stability c the [facility]   PATIENT EDUCATION:  Education details: Eval findings, POC, HEP, self care  Person educated: Patient Education method: Explanation, Demonstration, Tactile cues, Verbal cues, and Handouts Education comprehension: verbalized understanding, returned demonstration, verbal cues required, tactile cues required, and needs further education  HOME EXERCISE PROGRAM: Access Code: [email or web address] URL: [email or web address] Date: 11/20/2023 Prepared by: Joellyn Rued  Exercises - Supine Quad Set  - 3 x daily - 7 x weekly - 1 sets - 10 reps - 5 hold - Supine Heel Slide with Strap  - 3 x daily - 7 x weekly - 1 sets - 10 reps - 5 hold - Supine Knee Extension Strengthening  - 3 x daily - 7 x weekly - 1 sets - 10 reps - 3 hold - Sit to Stand with Counter Support  - 3 x daily - 7 x weekly - 1 sets - 5 reps - Sitting Knee Extension with Resistance  - 1 x daily - 7 x weekly - 2 sets - 10 reps - Seated Hamstring Curl with Anchored Resistance  - 1 x daily - 7 x weekly - 2 sets - 10 reps - Standing Terminal Knee Extension with Resistance  - 1 x daily - 7 x weekly - 2 sets - 10 reps - 5 hold - Standing Single Leg Stance with Counter Support  - 1 x daily - 7 x weekly - 1 sets - 5 reps - 30 hold - Standing Tandem Balance with Counter Support  - 1 x daily - 7 x weekly - 1 sets - 5 reps - 30 hold - Side Stepping with Resistance at Thighs  - 1 x daily - 7 x weekly - 3 sets - 20 reps  ASSESSMENT:  CLINICAL IMPRESSION: PT was continued for R Quad strengthening bilat LE strengthening for improved stability with gait. Quad lag of the R knee continues to improve, able to use knee extension machine with 10#, lacks minimal extension. 2  MWT improved since last capture. 5 x STS 17 sec, unsure if first capture was assisted by UE, today she did not use UE. Pt has one more visit in her POC and will plan to review and finalize HEP.  PT today without adverse effects.   RE-EVAL: Pt is making progress re: R knee AROM, strength, and function. Regaining quad strength has been slower than anticipated, but pt is beginning to show improved strength as per her quad lag measure. Functionally, pt is continuing to need a RW for an assist device due to her R quad weakness, but she is finding her function with daily activities is improving as per her FOTO functional ability score. Pt will continue to benefit from skilled PT 2w6 to address impairments for improved R knee/LE function progressing pt's walking to a SPC as her R knee/leg strength improves. Will utilize BFR and or [place] stim to assist with strengthening as indicated.    EVAL: Patient is a 62 y.o. female who was seen today for physical therapy evaluation and treatment for  R53.1 (ICD-10-CM) - Weakness  Z96.659 (ICD-10-CM) - S/P knee replacement  .Pt presents to PT walking c a RW, WBAT R LE, mod Ind. For 1 week s/p R TKR surgery, the pt's demonstrates good AAROM. Strength is limited with pt not being able to complete a  SAQ or SLR. A HEP was initiated to address R knee/LE ROM and strength with pt returning demonstration. Pt will benefit from skilled PT 2w8 to address impairments to optimize function with less pain.   OBJECTIVE IMPAIRMENTS: decreased activity tolerance, decreased mobility, difficulty walking, decreased ROM, decreased strength, increased edema, and pain.   ACTIVITY LIMITATIONS: carrying, lifting, bending, sitting, standing, squatting, sleeping, stairs, transfers, bed mobility, bathing, toileting, dressing, self feeding, hygiene/grooming, locomotion level, and caring for others  PARTICIPATION LIMITATIONS: meal prep, cleaning, laundry, driving, shopping, and community  activity  PERSONAL FACTORS: Past/current experiences and Time since onset of injury/illness/exacerbation are also affecting patient's functional outcome.   REHAB POTENTIAL: Excellent  CLINICAL DECISION MAKING: Stable/uncomplicated  EVALUATION COMPLEXITY: Low   GOALS:  SHORT TERM GOALS: Target date: 09/18/23 Pt will be Ind in an initial HEP  Baseline: started Goal status: MET  2.  Increase L knee AROM to 5-100d to improve R knee function Baseline: 10-80d 09/07/23: significant quad lag Goal status: PARTIALLY MET (for flexion)   3.  Pt will be able to complete a SAQ and SLR independently Baseline: unable 09/07/23: unable 09/14/23: can complete with -20 d lag 10/12/23: -15 quad lag Goal status: ONGOING  LONG TERM GOALS: Target date: 12/04/23  Pt will be Ind in a final HEP to maintain achieved LOF Baseline: started Goal status: ONGOING  2.  Increase L knee AROM to 0-115d to for appropriate R knee function Baseline: 10-80d 09/29/23: 20-117 10/01/23: c quad set 3d lacking, with SLR -20d quad lag 10/12/23: SLR -15d quad lag 11/25/23: -12 quad lag Goal status: ONGOING  3.  Increased R hip and knee strength to 4/5 or greater for appropriate R knee function Baseline: See flow sheets Goal status: IMPROVED  4.  Pt will be able to walk 847ft Indly and asc/dsc 12 steps c a HR and mod Ind for community mobility  Baseline:     11/10/23: 455'  Goal status: ONGOING  5.  Improve 5xSTS by MCID of 5" and by MCID of 36ft as indication of improved functional mobility  Baseline: Complete and 5xSTS when pt is no longer needing an assist device with walking 10/05/23: 2 MWT 186 feet with RW; 5 x STS 15.2 sec 12/01/23: Met for 2 MWT  Goal status: PARTIALLY MET  6.  Pt's FOTO score will improved to the predicted value of 51% as indication of improved function  Baseline: 39%.  10/07/23=54% Goal status: MET   PLAN:  PT FREQUENCY: 2x/week  PT DURATION: 8 weeks  PLANNED  INTERVENTIONS: Therapeutic exercises, Therapeutic activity, Neuromuscular re-education, Balance training, Gait training, Patient/Family education, Self Care, Joint mobilization, Stair training, Aquatic Therapy, Dry Needling, Electrical stimulation, Cryotherapy, Moist heat, Taping, Vasopneumatic device, Ultrasound, Ionotophoresis 4mg /ml Dexamethasone, Manual therapy, and Re-evaluation  PLAN FOR NEXT SESSION: Discharge next visit  Jannette Spanner, PTA 12/01/23 9:59 AM Phone: [phone] Fax: [phone]

## 2023-12-02 ENCOUNTER — Ambulatory Visit: Payer: Medicaid Other | Admitting: Physical Medicine and Rehabilitation

## 2023-12-02 ENCOUNTER — Other Ambulatory Visit: Payer: Self-pay

## 2023-12-02 VITALS — BP 131/83 | HR 69

## 2023-12-02 DIAGNOSIS — M5416 Radiculopathy, lumbar region: Secondary | ICD-10-CM | POA: Diagnosis not present

## 2023-12-02 MED ORDER — METHYLPREDNISOLONE ACETATE 40 MG/ML IJ SUSP
40.0000 mg | Freq: Once | INTRAMUSCULAR | Status: AC
Start: 2023-12-02 — End: 2023-12-02
  Administered 2023-12-02: 40 mg

## 2023-12-02 NOTE — Progress Notes (Signed)
Right L4 TF injections Pt says its getting worse.  Pain level 7 today. Taking tylenol   Has driver No blood thinner No allergy to contrast

## 2023-12-02 NOTE — Therapy (Signed)
OUTPATIENT PHYSICAL THERAPY LOWER EXTREMITY TREATMENT/Discharge   Patient Name: Sandra Lozano MRN: [id number] DOB:May 23, 1961, 62 y.o., female Today's Date: 12/04/2023  END OF SESSION:  PT End of Session - 12/03/23 0734     Visit Number 23    Number of Visits 25    Date for PT Re-Evaluation 12/04/23    Authorization Type [place] MEDICAID [facility]    Authorization Time Period Approved 12 visits 10/14/23-12/16/23    Authorization - Visit Number 10    Authorization - Number of Visits 12    PT Start Time 0718    PT Stop Time 0800    PT Time Calculation (min) 42 min    Activity Tolerance Patient tolerated treatment well    Behavior During Therapy [facility] for tasks assessed/performed                        Past Medical History:  Diagnosis Date   Arthritis    right knee, lower back   Chest wall pain 04/20/2021   Dyspnea    very rare -tx with albuterol neb sol if needed   Hyperlipidemia    Hypertension    Intertrigo 08/06/2022   Pre-diabetes    per patient - does not check blood sugar   Right hip pain 05/05/2018   Rupture of anterior cruciate ligament of right knee 02/15/2018   Sprain of medial collateral ligament of right knee 02/15/2018   SVD (spontaneous vaginal delivery)    x 5 - only 2 living, 1 stillborn and 1 premature at 7 months demise,1 child deceased   Wears dentures    upper and lower   Past Surgical History:  Procedure Laterality Date   FOOT ARTHRODESIS Left 11/02/2020   Procedure: LEFT MIDFOOT FUSION BASE 1ST AND 2ND METATARSAL;  Surgeon: Nadara Mustard, MD;  Location: [facility] OR;  Service: Orthopedics;  Laterality: Left;   LUMBAR FUSION  05/20/2019   GILL PROCEDURE, LEFT TRANSFORAMINAL LUMBAR INTERBODY FUSION, PEDICLE INSTRUMENTATION, BILATERAL FUSION (N/A   TOTAL KNEE ARTHROPLASTY Right 08/21/2023   Procedure: RIGHT TOTAL KNEE ARTHROPLASTY;  Surgeon: Nadara Mustard, MD;  Location: [facility] OR;  Service: Orthopedics;  Laterality: Right;   TUBAL LIGATION      Patient Active Problem List   Diagnosis Date Noted   Unilateral primary osteoarthritis, right knee 08/21/2023   Total knee replacement status, right 08/21/2023   Hidradenitis suppurativa of right axilla 06/03/2023   Vaginal irritation 02/21/2023   Skin lesion of back 02/21/2023   Tinea 12/18/2022   Left shoulder pain 08/25/2022   DJD (degenerative joint disease), multiple sites 08/06/2022   COPD, mild (HCC) 02/11/2022   Carpal tunnel syndrome, bilateral 12/21/2021   Arthritis of finger of left hand 11/26/2021   Traumatic arthritis of left foot    Lichen simplex chronicus 06/04/2020   Arthritis of midfoot 03/14/2020   History of lumbar fusion 03/14/2020   Well controlled diabetes mellitus (HCC) 11/07/2019   Nummular eczema 07/06/2019   Lumbar stenosis 05/20/2019   Acute left-sided low back pain without sciatica 07/18/2018   Osteoarthritis of right hip 06/03/2018   Chronic right shoulder pain 05/05/2018   Essential hypertension 02/15/2018    PCP: Lockie Mola, MD   REFERRING PROVIDER: Nadara Mustard, MD   REFERRING DIAG:  R53.1 (ICD-10-CM) - Weakness  Z96.659 (ICD-10-CM) - S/P knee replacement    THERAPY DIAG:  Chronic pain of right knee  Muscle weakness (generalized)  Difficulty in walking, not elsewhere classified  Rationale for  Evaluation and Treatment: Rehabilitation  ONSET DATE: 08/21/23 surgery  SUBJECTIVE:   SUBJECTIVE STATEMENT: Pt reports she is pleased with the progress she has made.   PERTINENT HISTORY: HTN  PAIN:  Are you having pain? Yes: NPRS scale: 0/10 Pain location: R knee Pain description: ache, discomfort Aggravating factors: Moving, walking Relieving factors: Ice man, tylenol  PRECAUTIONS: Knee  RED FLAGS: None   WEIGHT BEARING RESTRICTIONS: No WBAT  FALLS:  Has patient fallen in last 6 months? No  LIVING ENVIRONMENT: Lives with: lives with their family Lives in: House/apartment Stairs: Yes: External: 14 steps; can  reach both   OCCUPATION: Not working  PLOF: Independent with household mobility with device and Independent with community mobility with device  PATIENT GOALS: Good use of my R knee  NEXT MD VISIT: 09/08/23  OBJECTIVE:   PATIENT SURVEYS:  FOTO: Perceived function   39%, predicted   51%   10/07/23: 54%   COGNITION: Overall cognitive status: Within functional limits for tasks assessed     SENSATION: WFL  EDEMA:  Swelling of the R knee  MUSCLE LENGTH: Hamstrings: Right NT deg; Left NT deg Thomas test: Right NT deg; Left NT deg  POSTURE: No Significant postural limitations  PALPATION: TTP of the peri-R knee and thigh  LOWER EXTREMITY ROM:  Active assisted ROM Right eval Left eval Right  Right 09/07/23 Right  09/14/23 Right 09/21/23 Right 09/29/23 Right 10/12/23 10/23/23 RT 11/18/23:  RT  Hip flexion            Hip extension            Hip abduction            Hip adduction            Hip internal rotation            Hip external rotation                        Knee flexion 80  98 AA 107 A 108 AA  117  120 A   Knee extension 10 lacking    -20 quad lag -20 -20 -15 -12 -10  Ankle dorsiflexion            Ankle plantarflexion            Ankle inversion            Ankle eversion            11/04/23= -12 for quad lag c NMES. 11/11/23= -15 s NMES; 11/25/23=-12 s NMES. 12/03/23: 3-120 -12 quad lag   (Blank rows = not tested)  LOWER EXTREMITY MMT:  MMT Right eval Left eval RT RT 12/03/23  Hip flexion 2  3 4-  Hip extension    4-  Hip abduction 2  3 4-  Hip adduction      Hip internal rotation 2     Hip external rotation 2  3 4-  Knee flexion 2  3 4-  Knee extension 2  3- 4  Ankle dorsiflexion      Ankle plantarflexion      Ankle inversion      Ankle eversion       (Blank rows = not tested)  LOWER EXTREMITY SPECIAL TESTS:  NT  FUNCTIONAL TESTS:  Complete and 5xSTS when pt is no longer needing an assist device with walking 2 MWT with RW 186  Feet : 12/01/23: 308 feet without AD  SL  standing: R 2", R 2":   12/01/23: 5 x STS 17 sec no UE standard chair  GAIT: Distance walked: 132ft Assistive device utilized: Environmental consultant - 2 wheeled Level of assistance: Complete Independence Comments: Step to pattern c L LE   TODAY'S TREATMENT: [facility] Adult PT Treatment:                                                DATE: 12/03/19 Therapeutic Exercise: Nustep 6 mins L7 UE/LE - Sit to Stand with Counter Support  15 reps - Sitting Knee Extension with GTB 15 reps - Seated Hamstring Curl with GTB 15 reps - Side Stepping with GTB 20 reps - Clam with GTB 15 reps - Sidelying Hip Abduction with GTB 15 reps - Heel Toe Raises with Counter Support 15 reps - 2 hold - Active Straight Leg Raise with Quad Set 15 reps - Supine Bridge  15 reps Therapeutic Activity: Gait training with emphasis on proper pace - Standing Single Leg Stance with Counter Support 3 reps - 30 hold - Standing Tandem Balance with Counter Support 3 reps - 30 hold  [facility] Adult PT Treatment:                                                DATE: 12/01/23 Therapeutic Exercise: Knee ext 10 # bilat concentric and R eccentric lower  Knee flex 25# bilat conentric and R eccentric  SL Leg press 35#  SLS 12 sec L, 5 sec R  Tandem 12 sec RLE back  6 inch lateral step up with hip abduction x 12 each   Therapeutic Activity: 2 MWT 308 feet 5 x STS 17 sec  [facility] Adult PT Treatment:                                                DATE: 11/25/23 Therapeutic Exercise: lateral step ups 6" 2x10 STS 2x10 20# SL stands Banded side steps 10' x6 Leg press 25# R 10 x 2  Estim Attended:  NMES 30 mA x 15 minutes including set up, right quad, 10 sec on/10 sec off concurrent with:  SAQ x 3 min c 5# SLR x 3 min c 3# LAQ x 3 min c 5#  [facility] Adult PT Treatment:                                                DATE: 11/20/23 Therapeutic Exercise: LAQ 2x10 5# lateral step ups 6" 2x10 Banded side steps 10'  x4 Wall squats 10 sec x 10 Leg press 25# R 10 x 2  Estim Attended:  NMES 30 mA x 15 minutes including set up, right quad, 10 sec on/10 sec off concurrent with:  SAQ x 3 min c 3# SLR x 3 min c 3# LAQ x 3 min c 3#  PATIENT EDUCATION:  Education details: Eval findings, POC, HEP, self care  Person educated: Patient Education method: Explanation, Demonstration, Tactile cues, Verbal cues,  and Handouts Education comprehension: verbalized understanding, returned demonstration, verbal cues required, tactile cues required, and needs further education  HOME EXERCISE PROGRAM: Access Code: [email or web address] URL: [email or web address] Date: 12/03/2023 Prepared by: Joellyn Rued  Exercises - Sit to Stand with Counter Support  - 3 x daily - 7 x weekly - 1 sets - 5 reps - Sitting Knee Extension with Resistance  - 1 x daily - 7 x weekly - 2 sets - 10 reps - Seated Hamstring Curl with Anchored Resistance  - 1 x daily - 7 x weekly - 2 sets - 10 reps - Standing Single Leg Stance with Counter Support  - 1 x daily - 7 x weekly - 1 sets - 5 reps - 30 hold - Standing Tandem Balance with Counter Support  - 1 x daily - 7 x weekly - 1 sets - 5 reps - 30 hold - Side Stepping with Resistance at Thighs  - 1 x daily - 7 x weekly - 3 sets - 20 reps - Clam with Resistance  - 1 x daily - 7 x weekly - 3 sets - 10 reps - Sidelying Hip Abduction with Resistance at Thighs (Mirrored)  - 1 x daily - 7 x weekly - 3 sets - 10 reps - Heel Toe Raises with Counter Support  - 1 x daily - 7 x weekly - 3 sets - 10 reps - 2 hold - Active Straight Leg Raise with Quad Set  - 1 x daily - 7 x weekly - 3 sets - 10 reps - Supine Bridge  - 1 x daily - 7 x weekly - 3 sets - 10 reps  ASSESSMENT:  CLINICAL IMPRESSION: Pt completed her final PT session today. Pt has made good progress with R knee ROM and functional mobility. Pt has progressed from needing a RW to now using a SPC. Over the course of PT, the pt had issues with R quad/LE  strength. The quad strength gradually improved, but the pt continues to have R hip weakness and ankle arthritis which is why she continues to need a [facility] for support with ambulation. Pt is Dced from PT with the majority of her goals met. Pt is Ind in a HEP to continue to address her R LE strength. Pt is in agreement with DC at this time.   RE-EVAL: Pt is making progress re: R knee AROM, strength, and function. Regaining quad strength has been slower than anticipated, but pt is beginning to show improved strength as per her quad lag measure. Functionally, pt is continuing to need a RW for an assist device due to her R quad weakness, but she is finding her function with daily activities is improving as per her FOTO functional ability score. Pt will continue to benefit from skilled PT 2w6 to address impairments for improved R knee/LE function progressing pt's walking to a SPC as her R knee/leg strength improves. Will utilize BFR and or [place] stim to assist with strengthening as indicated.    EVAL: Patient is a 62 y.o. female who was seen today for physical therapy evaluation and treatment for  R53.1 (ICD-10-CM) - Weakness  Z96.659 (ICD-10-CM) - S/P knee replacement  .Pt presents to PT walking c a RW, WBAT R LE, mod Ind. For 1 week s/p R TKR surgery, the pt's demonstrates good AAROM. Strength is limited with pt not being able to complete a SAQ or SLR. A HEP was initiated to address R knee/LE ROM and strength with pt returning demonstration. Pt  will benefit from skilled PT 2w8 to address impairments to optimize function with less pain.   OBJECTIVE IMPAIRMENTS: decreased activity tolerance, decreased mobility, difficulty walking, decreased ROM, decreased strength, increased edema, and pain.   ACTIVITY LIMITATIONS: carrying, lifting, bending, sitting, standing, squatting, sleeping, stairs, transfers, bed mobility, bathing, toileting, dressing, self feeding, hygiene/grooming, locomotion level, and caring for  others  PARTICIPATION LIMITATIONS: meal prep, cleaning, laundry, driving, shopping, and community activity  PERSONAL FACTORS: Past/current experiences and Time since onset of injury/illness/exacerbation are also affecting patient's functional outcome.   REHAB POTENTIAL: Excellent  CLINICAL DECISION MAKING: Stable/uncomplicated  EVALUATION COMPLEXITY: Low   GOALS:  SHORT TERM GOALS: Target date: 09/18/23 Pt will be Ind in an initial HEP  Baseline: started Goal status: MET  2.  Increase L knee AROM to 5-100d to improve R knee function Baseline: 10-80d 09/07/23: significant quad lag Goal status: PARTIALLY MET (for flexion)   3.  Pt will be able to complete a SAQ and SLR independently Baseline: unable 09/07/23: unable 09/14/23: can complete with -20 d lag 10/12/23: -15 quad lag Goal status: ONGOING  LONG TERM GOALS: Target date: 12/04/23  Pt will be Ind in a final HEP to maintain achieved LOF Baseline: started Goal status: MET  2.  Increase L knee AROM to 0-115d to for appropriate R knee function Baseline: 10-80d 09/29/23: 20-117 10/01/23: c quad set 3d lacking, with SLR -20d quad lag 10/12/23: SLR -15d quad lag 11/25/23: -12 quad lag 12/03/23:0-120 AROM; -12d quad lag Goal status: MET  3.  Increased R hip and knee strength to 4/5 or greater for appropriate R knee function Baseline: See flow sheets Goal status: IMPROVED-not met  4.  Pt will be able to walk 85ft Indly and asc/dsc 12 steps c a HR and mod Ind for community mobility  Baseline:     11/10/23: 455'     12/03/23: 825' Goal status: MET  5.  Improve 5xSTS by MCID of 5" and by MCID of 54ft as indication of improved functional mobility  Baseline: Complete and 5xSTS when pt is no longer needing an assist device with walking 10/05/23: 2 MWT 186 feet with RW; 5 x STS 15.2 sec 12/01/23: Met for 2 MWT  Goal status: MET  6.  Pt's FOTO score will improved to the predicted value of 51% as indication of  improved function  Baseline: 39%.  10/07/23=54% Goal status: MET   PLAN:  PT FREQUENCY: 2x/week  PT DURATION: 8 weeks  PLANNED INTERVENTIONS: Therapeutic exercises, Therapeutic activity, Neuromuscular re-education, Balance training, Gait training, Patient/Family education, Self Care, Joint mobilization, Stair training, Aquatic Therapy, Dry Needling, Electrical stimulation, Cryotherapy, Moist heat, Taping, Vasopneumatic device, Ultrasound, Ionotophoresis 4mg /ml Dexamethasone, Manual therapy, and Re-evaluation  PLAN FOR NEXT SESSION: Discharge next visit  PHYSICAL THERAPY DISCHARGE SUMMARY  Visits from Start of Care: 23  Current functional level related to goals / functional outcomes: See clinical impression and PT goals    Remaining deficits: See clinical impression and PT goals    Education / Equipment: HEP, Gait training, Pt Ed  Patient agrees to discharge. Patient goals were  majority of goals met . Patient is being discharged due to being pleased with the current functional level.   Gusta Marksberry MS, PT 12/04/23 5:47 AM

## 2023-12-02 NOTE — Patient Instructions (Signed)

## 2023-12-03 ENCOUNTER — Ambulatory Visit (INDEPENDENT_AMBULATORY_CARE_PROVIDER_SITE_OTHER): Payer: Medicaid Other | Admitting: Family Medicine

## 2023-12-03 ENCOUNTER — Ambulatory Visit: Payer: Medicaid Other

## 2023-12-03 VITALS — BP 157/76 | HR 76 | Ht 64.0 in | Wt 181.8 lb

## 2023-12-03 DIAGNOSIS — L570 Actinic keratosis: Secondary | ICD-10-CM | POA: Diagnosis not present

## 2023-12-03 DIAGNOSIS — G8929 Other chronic pain: Secondary | ICD-10-CM

## 2023-12-03 DIAGNOSIS — M6281 Muscle weakness (generalized): Secondary | ICD-10-CM | POA: Diagnosis not present

## 2023-12-03 DIAGNOSIS — L280 Lichen simplex chronicus: Secondary | ICD-10-CM

## 2023-12-03 DIAGNOSIS — M25871 Other specified joint disorders, right ankle and foot: Secondary | ICD-10-CM

## 2023-12-03 DIAGNOSIS — M25561 Pain in right knee: Secondary | ICD-10-CM | POA: Diagnosis not present

## 2023-12-03 DIAGNOSIS — M25512 Pain in left shoulder: Secondary | ICD-10-CM

## 2023-12-03 DIAGNOSIS — L989 Disorder of the skin and subcutaneous tissue, unspecified: Secondary | ICD-10-CM | POA: Diagnosis not present

## 2023-12-03 DIAGNOSIS — I10 Essential (primary) hypertension: Secondary | ICD-10-CM

## 2023-12-03 DIAGNOSIS — R262 Difficulty in walking, not elsewhere classified: Secondary | ICD-10-CM | POA: Diagnosis not present

## 2023-12-03 MED ORDER — HALOBETASOL PROPIONATE 0.05 % EX OINT: TOPICAL_OINTMENT | Freq: Two times a day (BID) | CUTANEOUS | 0 refills | Status: DC

## 2023-12-03 NOTE — Progress Notes (Signed)
    SUBJECTIVE:   CHIEF COMPLAINT / HPI:   Skin lesion of back Seen previously for this on 11/26/2023 after failing antifungals and high potency triamcinolone.  Is here today to get her biopsy.  Scalp lesion Also has persistent scalp lesion which she has been seen for in the past, as well.  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  She still feels this is very itchy.  OBJECTIVE:   BP (!) 145/75   Pulse 76   Ht 5\' 4"  (1.626 m)   Wt 181 lb 12.8 oz (82.5 kg)   LMP  (LMP Unknown)   SpO2 99%   BMI 31.21 kg/m   General: Alert and oriented, in NAD Skin: Warm, dry, and intact; right upper back with splotchy hyperpigmented lesion with overlying scale, mildly hyperpigmented plaque on right scalp with hair thinning HEENT: NCAT, EOM grossly normal, midline nasal septum Respiratory: Breathing and speaking comfortably on RA Extremities: Moves all extremities grossly equally Neurological: No gross focal deficit Psychiatric: Appropriate mood and affect     ASSESSMENT/PLAN:   Skin lesion of back Lesion appears stable from last visit.  Benefits and risk of punch biopsy discussed; patient wanted to proceed as below.  Will follow-up biopsy results.  Differential continues to include lichen simplex chronicus, eczema, mycosis fungoides, cutaneous lupus, tinea incognito, lichen planus, sarcoidosis.  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  Given failure with lower potency steroids, recommended using halobetasol 0.05% ointment twice daily to the lesion for 2 weeks.  Advised to follow-up at that time for improvement.  Essential hypertension Blood pressure mildly elevated today.  Likely in response to procedure.  Reassured by comfortably on exam. Follow-up at next appointment.   PROCEDURE NOTE: Pre-op Diagnosis: Hyperpigmented skin lesion of right upper back Post-op Diagnosis: Same Procedure: Punch Skin Biopsy Location: Right upper back Performing Physician:  Janeal Holmes, MD Supervising Physician (if applicable): Janit Pagan, MD  Informed consent was obtained prior to the procedure. The area surrounding the skin lesion was prepared and wiped clean with alcohol prep. The area was sufficiently anesthetized with 1% Lidocaine with epinephrine.  A size 3 mm disposable biopsy punch tool was used to obtain tissue sample and placed in labeled biopsy cup.  Mild pressure  was used to obtain hemostasis. The site was not closed. The patient tolerated the procedure well without complications.  Standard post-procedure care was explained and return precautions and wound care handout given.    Janeal Holmes, MD [facility]

## 2023-12-03 NOTE — Assessment & Plan Note (Signed)
Blood pressure mildly elevated today.  Likely in response to procedure.  Reassured by comfortably on exam. Follow-up at next appointment.

## 2023-12-03 NOTE — Patient Instructions (Addendum)
I have sent in halobetasol ointment to apply to the scalp twice a day. Do not apply for more than 14 days. Come back in 2 weeks to see how you are doing. We then can try minoxidil for hair growth.  For your back, I will let you know when results are in. We will decide therapy at that time! Let me know if you have other questions before then.  Skin Biopsy, Care After The following information offers guidance on how to care for yourself after your procedure. Your health care provider may also give you more specific instructions. If you have problems or questions, contact your health care provider. What can I expect after the procedure? After the procedure, it is common to have: Soreness or mild pain. Bruising. Itching. Some redness and swelling. Follow these instructions at home: Biopsy site care  Follow instructions from your health care provider about how to take care of your biopsy site. Make sure you: Wash your hands with soap and water for at least 20 seconds before and after you change your bandage (dressing). If soap and water are not available, use hand sanitizer. Change your dressing as told by your health care provider. Leave stitches (sutures), skin glue, or adhesive strips in place. These skin closures may need to stay in place for 2 weeks or longer. If adhesive strip edges start to loosen and curl up, you may trim the loose edges. Do not remove adhesive strips completely unless your health care provider tells you to do that. Check your biopsy site every day for signs of infection. Check for: More redness, swelling, or pain. Fluid or blood. Warmth. Pus or a bad smell. Do not take baths, swim, or use a hot tub until your health care provider approves. Ask your health care provider if you may take showers. You may only be allowed to take sponge baths. General instructions Take over-the-counter and prescription medicines only as told by your health care provider. Return to your  normal activities as told by your health care provider. Ask your health care provider what activities are safe for you. Keep all follow-up visits. This is important. Contact a health care provider if: You have more redness, swelling, or pain around your biopsy site. You have fluid or blood coming from your biopsy site. Your biopsy site feels warm to the touch. You have pus or a bad smell coming from your biopsy site. You have a fever. Your sutures, skin glue, or adhesive strips loosen or come off sooner than expected. Get help right away if: You have bleeding that does not stop with pressure or a dressing. Summary After the procedure, it is common to have soreness, bruising, and itching at the site. Follow instructions from your health care provider about how to take care of your biopsy site. Check your biopsy site every day for signs of infection. Contact a health care provider if you have more redness, swelling, or pain around your biopsy site, or your biopsy site feels warm to the touch. Keep all follow-up visits. This is important. This information is not intended to replace advice given to you by your health care provider. Make sure you discuss any questions you have with your health care provider. Document Revised: 07/02/2021 Document Reviewed: 07/02/2021 Elsevier Patient Education  2024 [employer].

## 2023-12-03 NOTE — Assessment & Plan Note (Addendum)
Persistent lesion on the scalp.  Given failure with lower potency steroids, recommended using halobetasol 0.05% ointment twice daily to the lesion for 2 weeks.  Advised to follow-up at that time for improvement.

## 2023-12-03 NOTE — Assessment & Plan Note (Addendum)
Lesion appears stable from last visit.  Benefits and risk of punch biopsy discussed; patient wanted to proceed as below.  Will follow-up biopsy results.  Differential continues to include lichen simplex chronicus, eczema, mycosis fungoides, cutaneous lupus, tinea incognito, lichen planus, sarcoidosis.

## 2023-12-04 ENCOUNTER — Ambulatory Visit
Admission: RE | Admit: 2023-12-04 | Discharge: 2023-12-04 | Disposition: A | Discharge status: Home / Self Care | Payer: Medicaid Other | Source: Ambulatory Visit | Attending: Family Medicine | Admitting: Family Medicine

## 2023-12-04 DIAGNOSIS — Z1231 Encounter for screening mammogram for malignant neoplasm of breast: Secondary | ICD-10-CM | POA: Diagnosis not present

## 2023-12-04 DIAGNOSIS — Z0000 Encounter for general adult medical examination without abnormal findings: Secondary | ICD-10-CM

## 2023-12-04 MED ORDER — DICLOFENAC SODIUM 1 % EX GEL
CUTANEOUS | 2 refills | Status: DC
Start: 2023-12-04 — End: 2024-04-28

## 2023-12-07 ENCOUNTER — Encounter: Payer: Medicaid Other | Admitting: Physical Therapy

## 2023-12-07 LAB — DERMATOLOGY PATHOLOGY

## 2023-12-08 ENCOUNTER — Telehealth: Payer: Self-pay | Admitting: Family Medicine

## 2023-12-08 NOTE — Telephone Encounter (Signed)
Skin biopsy result discussed. 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 Triggers discussed to include sun exposure, chronic irritation/friction, e.g scratching. She stated she stopped scratching the lesion and has been using her Halobetasol x 3 days. Monitor for improvement and f/u with PCP as planned.

## 2023-12-10 ENCOUNTER — Other Ambulatory Visit: Payer: Self-pay | Admitting: Family Medicine

## 2023-12-10 ENCOUNTER — Ambulatory Visit: Payer: Medicaid Other | Admitting: Student

## 2023-12-10 VITALS — BP 156/84 | HR 78 | Ht 64.0 in | Wt 182.8 lb

## 2023-12-10 DIAGNOSIS — Z23 Encounter for immunization: Secondary | ICD-10-CM

## 2023-12-10 DIAGNOSIS — I10 Essential (primary) hypertension: Secondary | ICD-10-CM

## 2023-12-10 DIAGNOSIS — G5601 Carpal tunnel syndrome, right upper limb: Secondary | ICD-10-CM

## 2023-12-10 DIAGNOSIS — E119 Type 2 diabetes mellitus without complications: Secondary | ICD-10-CM

## 2023-12-10 DIAGNOSIS — E162 Hypoglycemia, unspecified: Secondary | ICD-10-CM | POA: Diagnosis not present

## 2023-12-10 LAB — POCT GLYCOSYLATED HEMOGLOBIN (HGB A1C): HbA1c, POC (controlled diabetic range): 5.8 % (ref 0.0–7.0)

## 2023-12-10 LAB — POCT GLUCOSE FINGERSTICK: Glucose: 106 — AB (ref 70–99)

## 2023-12-10 NOTE — Assessment & Plan Note (Signed)
Elevated on repeat, asymptomatic. She is under stress right now, missed an insurance payment, was stressed about her low blood sugar reading at home.  Prefers not to start additional BP med at this time. -Continue Zestoretic 20-12.5 mg 2 tablets daily -Check BP at home daily and bring BP monitor and readings to next appointment in 2 weeks

## 2023-12-10 NOTE — Procedures (Signed)
Lumbosacral Transforaminal Epidural Steroid Injection - Sub-Pedicular Approach with Fluoroscopic Guidance  Patient: Sandra Lozano      Date of Birth: 1961-09-21 MRN: [id number] PCP: Lockie Mola, MD      Visit Date: 12/02/2023   Universal Protocol:    Date/Time: 12/02/2023  Consent Given By: the patient  Position: PRONE  Additional Comments: Vital signs were monitored before and after the procedure. Patient was prepped and draped in the usual sterile fashion. The correct patient, procedure, and site was verified.   Injection Procedure Details:   Procedure diagnoses: Lumbar radiculopathy [M54.16]    Meds Administered:  Meds ordered this encounter  Medications   methylPREDNISolone acetate (DEPO-MEDROL) injection 40 mg    Laterality: Right  Location/Site: L4  Needle:5.0 in., 22 ga.  Short bevel or Quincke spinal needle  Needle Placement: Transforaminal  Findings:    -Comments: Excellent flow of contrast along the nerve, nerve root and into the epidural space.  Procedure Details: After squaring off the end-plates to get a true AP view, the C-arm was positioned so that an oblique view of the foramen as noted above was visualized. The target area is just inferior to the "nose of the scotty dog" or sub pedicular. The soft tissues overlying this structure were infiltrated with 2-3 ml. of 1% Lidocaine without Epinephrine.  The spinal needle was inserted toward the target using a "trajectory" view along the fluoroscope beam.  Under AP and lateral visualization, the needle was advanced so it did not puncture dura and was located close the 6 O'Clock position of the pedical in AP tracterory. Biplanar projections were used to confirm position. Aspiration was confirmed to be negative for CSF and/or blood. A 1-2 ml. volume of Isovue-250 was injected and flow of contrast was noted at each level. Radiographs were obtained for documentation purposes.   After attaining the desired flow of  contrast documented above, a 0.5 to 1.0 ml test dose of 0.25% Marcaine was injected into each respective transforaminal space.  The patient was observed for 90 seconds post injection.  After no sensory deficits were reported, and normal lower extremity motor function was noted,   the above injectate was administered so that equal amounts of the injectate were placed at each foramen (level) into the transforaminal epidural space.   Additional Comments:  No complications occurred Dressing: 2 x 2 sterile gauze and Band-Aid    Post-procedure details: Patient was observed during the procedure. Post-procedure instructions were reviewed.  Patient left the clinic in stable condition.

## 2023-12-10 NOTE — Progress Notes (Signed)
Sandra Lozano - 62 y.o. female MRN [id number]  Date of birth: [birth year]  Office Visit Note: Visit Date: 12/02/2023 PCP: Lockie Mola, MD Referred by: Lockie Mola, MD  Subjective: Chief Complaint  Patient presents with   Lower Back - Pain   HPI:  Sandra Lozano is a 62 y.o. female who comes in today for planned repeat Right L4-5  Lumbar Transforaminal epidural steroid injection with fluoroscopic guidance.  The patient has failed conservative care including home exercise, medications, time and activity modification.  This injection will be diagnostic and hopefully therapeutic.  Please see requesting physician notes for further details and justification. Patient received more than 50% pain relief from prior injection.   Referring: Dr. Aldean Baker and Barnie Del, FNP   ROS Otherwise per HPI.  Assessment & Plan: Visit Diagnoses:    ICD-10-CM   1. Lumbar radiculopathy  M54.16 XR C-ARM NO REPORT    Epidural Steroid injection    methylPREDNISolone acetate (DEPO-MEDROL) injection 40 mg      Plan: No additional findings.   Meds & Orders:  Meds ordered this encounter  Medications   methylPREDNISolone acetate (DEPO-MEDROL) injection 40 mg    Orders Placed This Encounter  Procedures   XR C-ARM NO REPORT   Epidural Steroid injection    Follow-up: Return for visit to requesting provider as needed.   Procedures: No procedures performed  Lumbosacral Transforaminal Epidural Steroid Injection - Sub-Pedicular Approach with Fluoroscopic Guidance  Patient: Sandra Lozano      Date of Birth: [birth year] MRN: [id number] PCP: Lockie Mola, MD      Visit Date: 12/02/2023   Universal Protocol:    Date/Time: 12/02/2023  Consent Given By: the patient  Position: PRONE  Additional Comments: Vital signs were monitored before and after the procedure. Patient was prepped and draped in the usual sterile fashion. The correct patient, procedure, and site was verified.   Injection  Procedure Details:   Procedure diagnoses: Lumbar radiculopathy [M54.16]    Meds Administered:  Meds ordered this encounter  Medications   methylPREDNISolone acetate (DEPO-MEDROL) injection 40 mg    Laterality: Right  Location/Site: L4  Needle:5.0 in., 22 ga.  Short bevel or Quincke spinal needle  Needle Placement: Transforaminal  Findings:    -Comments: Excellent flow of contrast along the nerve, nerve root and into the epidural space.  Procedure Details: After squaring off the end-plates to get a true AP view, the C-arm was positioned so that an oblique view of the foramen as noted above was visualized. The target area is just inferior to the "nose of the scotty dog" or sub pedicular. The soft tissues overlying this structure were infiltrated with 2-3 ml. of 1% Lidocaine without Epinephrine.  The spinal needle was inserted toward the target using a "trajectory" view along the fluoroscope beam.  Under AP and lateral visualization, the needle was advanced so it did not puncture dura and was located close the 6 O'Clock position of the pedical in AP tracterory. Biplanar projections were used to confirm position. Aspiration was confirmed to be negative for CSF and/or blood. A 1-2 ml. volume of Isovue-250 was injected and flow of contrast was noted at each level. Radiographs were obtained for documentation purposes.   After attaining the desired flow of contrast documented above, a 0.5 to 1.0 ml test dose of 0.25% Marcaine was injected into each respective transforaminal space.  The patient was observed for 90 seconds post injection.  After no sensory deficits were reported, and  normal lower extremity motor function was noted,   the above injectate was administered so that equal amounts of the injectate were placed at each foramen (level) into the transforaminal epidural space.   Additional Comments:  No complications occurred Dressing: 2 x 2 sterile gauze and Band-Aid    Post-procedure  details: Patient was observed during the procedure. Post-procedure instructions were reviewed.  Patient left the clinic in stable condition.    Clinical History: EXAM: MRI LUMBAR SPINE WITHOUT CONTRAST   TECHNIQUE: Multiplanar, multisequence MR imaging of the lumbar spine was performed. No intravenous contrast was administered.   COMPARISON:  Lumbar radiographs 09/28/2018   FINDINGS: Segmentation:  Normal   Alignment:  7 mm anterolisthesis L4-5.  Remaining alignment normal   Vertebrae: Negative for fracture or mass. Bone marrow edema at L4-5 related to discogenic change.   Conus medullaris and cauda equina: Conus extends to the L1-2 level. Conus and cauda equina appear normal.   Paraspinal and other soft tissues: Negative for paraspinous mass or fluid collection   Disc levels:   L1-2: Negative   L2-3: Negative   L3-4: Mild facet degeneration. Negative for disc protrusion or stenosis   L4-5: 7 mm anterolisthesis. Severe facet degeneration bilaterally. Severe disc degeneration with endplate erosive change and edema. Severe spinal stenosis. Severe subarticular foraminal stenosis left greater than right   L5-S1: Negative   IMPRESSION: Severe spinal stenosis L4-5 with severe subarticular foraminal stenosis bilaterally left greater than right. Grade 1 anterolisthesis L4-5. Remaining lumbar spine negative     Electronically Signed   By: Marlan Palau M.D.   On: 11/13/2018 15:34     Objective:  VS:  HT:    WT:   BMI:     BP:131/83  HR:69bpm  TEMP: ( )  RESP:  Physical Exam Vitals and nursing note reviewed.  Constitutional:      General: She is not in acute distress.    Appearance: Normal appearance. She is not ill-appearing.  HENT:     Head: Normocephalic and atraumatic.     Right Ear: External ear normal.     Left Ear: External ear normal.  Eyes:     Extraocular Movements: Extraocular movements intact.  Cardiovascular:     Rate and Rhythm:  Normal rate.     Pulses: Normal pulses.  Pulmonary:     Effort: Pulmonary effort is normal. No respiratory distress.  Abdominal:     General: There is no distension.     Palpations: Abdomen is soft.  Musculoskeletal:        General: Tenderness present.     Cervical back: Neck supple.     Right lower leg: No edema.     Left lower leg: No edema.     Comments: Patient has good distal strength with no pain over the greater trochanters.  No clonus or focal weakness.  Skin:    Findings: No erythema, lesion or rash.  Neurological:     General: No focal deficit present.     Mental Status: She is alert and oriented to person, place, and time.     Sensory: No sensory deficit.     Motor: No weakness or abnormal muscle tone.     Coordination: Coordination normal.  Psychiatric:        Mood and Affect: Mood normal.        Behavior: Behavior normal.      Imaging: No results found.

## 2023-12-10 NOTE — Assessment & Plan Note (Addendum)
POCT glucose in office 106 and A1c 5.8.  Discussed that glucose monitor is giving false low readings and it is reassuring that she feels very well.  -No need for checking blood sugar at home as she is not on insulin and A1c consistently under goal. -Continue metformin 500 mg daily and Ozempic 0.5 mg weekly to maintain A1c < 7 and maintain desired weight, may consider coming off of metformin completely in future

## 2023-12-10 NOTE — Patient Instructions (Signed)
It was great to see you! Thank you for allowing me to participate in your care!  I recommend that you always bring your medications to each appointment as this makes it easy to ensure you are on the correct medications and helps [place] not miss when refills are needed.  Our plans for today:  -Please schedule an appointment on your way out for about 2 weeks from now. -I recommend checking your blood pressure once a day at home and write these down.  Bring readings to next visit -Goal blood pressure is < 140/90   Take care and seek immediate care sooner if you develop any concerns.   Dr. Erick Alley, DO [facility]

## 2023-12-10 NOTE — Progress Notes (Signed)
    SUBJECTIVE:   CHIEF COMPLAINT / HPI:   T2DM Had a low sugar reading of 34 yesterday and 10 this morning  but states she feels very well and was shocked it was so low so she made an appointment to have it checked here. Last A1c 07/01/23 5.5 Currently taking metformin 500 mg daily and Ozempic 0.5 mg weekly (decreased from 1 mg ~ 2 months ago) and doing well on this dosage.   HTN Currently taking Zestoretic 20-12.5 mg 2 tablets daily.  Did take it this morning.  PERTINENT  PMH / PSH: T2DM, HTN  OBJECTIVE:   BP (!) 156/84   Pulse 78   Ht 5\' 4"  (1.626 m)   Wt 182 lb 12.8 oz (82.9 kg)   LMP  (LMP Unknown)   SpO2 100%   BMI 31.38 kg/m    General: NAD, pleasant, able to participate in exam Cardiac: RRR, no murmurs. Respiratory: CTAB, normal effort, No wheezes, rales or rhonchi Skin: warm and dry Neuro: alert, no obvious focal deficits Psych: Normal affect and mood  ASSESSMENT/PLAN:   Well controlled diabetes mellitus (HCC) POCT glucose in office 106 and A1c 5.8.  Discussed that glucose monitor is giving false low readings and it is reassuring that she feels very well.  -No need for checking blood sugar at home as she is not on insulin and A1c consistently under goal. -Continue metformin 500 mg daily and Ozempic 0.5 mg weekly to maintain A1c < 7 and maintain desired weight, may consider coming off of metformin completely in future   Essential hypertension Elevated on repeat, asymptomatic. She is under stress right now, missed an insurance payment, was stressed about her low blood sugar reading at home.  Prefers not to start additional BP med at this time. -Continue Zestoretic 20-12.5 mg 2 tablets daily -Check BP at home daily and bring BP monitor and readings to next appointment in 2 weeks   Health maintenance COVID and flu vaccines administered today Patient to return in 2 weeks for follow-up visit, can discuss and order annual lung cancer screening and other HM items if  appropriate  Dr. Erick Alley, DO [facility]

## 2023-12-14 ENCOUNTER — Other Ambulatory Visit: Payer: Self-pay | Admitting: Family Medicine

## 2023-12-14 ENCOUNTER — Encounter: Payer: Self-pay | Admitting: Family Medicine

## 2023-12-15 ENCOUNTER — Ambulatory Visit: Payer: Medicaid Other

## 2023-12-16 DIAGNOSIS — Z419 Encounter for procedure for purposes other than remedying health state, unspecified: Secondary | ICD-10-CM | POA: Diagnosis not present

## 2023-12-18 NOTE — Addendum Note (Signed)
 Addended by: Adonis Huguenin on: 12/18/2023 08:18 AM   Modules accepted: Orders

## 2023-12-24 ENCOUNTER — Ambulatory Visit (INDEPENDENT_AMBULATORY_CARE_PROVIDER_SITE_OTHER): Payer: Medicaid Other | Admitting: Family Medicine

## 2023-12-24 ENCOUNTER — Ambulatory Visit: Payer: Medicaid Other | Admitting: Student

## 2023-12-24 VITALS — BP 136/73 | HR 84 | Wt 179.6 lb

## 2023-12-24 DIAGNOSIS — I10 Essential (primary) hypertension: Secondary | ICD-10-CM | POA: Diagnosis not present

## 2023-12-24 DIAGNOSIS — L820 Inflamed seborrheic keratosis: Secondary | ICD-10-CM | POA: Insufficient documentation

## 2023-12-24 DIAGNOSIS — T148XXA Other injury of unspecified body region, initial encounter: Secondary | ICD-10-CM | POA: Diagnosis not present

## 2023-12-24 DIAGNOSIS — L280 Lichen simplex chronicus: Secondary | ICD-10-CM

## 2023-12-24 NOTE — Assessment & Plan Note (Signed)
 May be due to NSAID use CBC test reviewed with normal platelet She is unable to use NSAID prn due to daily pain She will continue same since her bruising is improving F/U with PCP to review pain regimen and initiate blood work to further evaluate if this reoccurs She agreed with the plan

## 2023-12-24 NOTE — Patient Instructions (Addendum)
 It was nice seeing you today. Your skin bruising could be due to your Mobic . Please come back and discuss pain management with your PCP soon.  Skin biopsy result discussed. 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 Triggers discussed to include sun exposure, chronic irritation/friction, e.g scratching. Please see PCP soon for reassessment.

## 2023-12-24 NOTE — Assessment & Plan Note (Signed)
 BP looks good F/U with PCP for BP management She will schedule f/u at check out

## 2023-12-24 NOTE — Assessment & Plan Note (Signed)
 Scalp lesion Good response to Halobetasol Continue use of Halobetasol prn

## 2023-12-24 NOTE — Progress Notes (Signed)
    SUBJECTIVE:   CHIEF COMPLAINT / HPI:   Right upper back lesion: The patient wanted to discuss the result of the back skin biopsy. She stated that she was not called about this but was called about her scalp lesion. She has not used the halobetasol  on her back. The itching has improved, but the rash is still the same.    Scalp lesion: The patient is impressed by the improvement in the scalp lesion with clobetasol. No new concerns.  Bruising: C/O right thigh bruising, which she noticed 10 days ago. She denies any trauma to the area. She sent a photo image to her PCP via her Mychart account. The bruises are almost gone now. She denies any pain otherwise. She denies the use of ASA but takes Mobic  daily for arthritis.   HTN: She was meant to have a BP check with a different provider today. She wishes to cancel that appointment since she already saw me in the Derm clinic.  PERTINENT  PMH / PSH: PMHx reviewed  OBJECTIVE:   BP 136/73   Pulse 84   Wt 179 lb 9.6 oz (81.5 kg)   LMP  (LMP Unknown)   SpO2 96%   BMI 30.83 kg/m   Physical Exam Vitals and nursing note reviewed.  Constitutional:      General: She is not in acute distress.    Appearance: Normal appearance.  Skin:    Comments: Right temporal scalp lesion with reduced hyperpigmentation and scaliness compared to prior. She now has hair growth in the area. + dry, hyperpigmented round patch on her right upper back - unchanged from the previous. Two faint round bruises on her right thigh - very much improved compared to the image sent via MyChart.  Neurological:     Mental Status: She is alert.      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  I reassured her this is a benign lesion and she can use the halobetasol  cream on it as well F/U  with PCP for reassessment and medication refills as needed She agreed with the plan  Lichen simplex chronicus Scalp lesion Good response to Halobetasol  Continue use of Halobetasol  prn  Bruising May be due to NSAID use CBC test reviewed with normal platelet She is unable to use NSAID prn due to daily pain She will continue same since her bruising is improving F/U with PCP to review pain regimen and initiate blood work to further evaluate if this reoccurs She agreed with the plan  Essential hypertension BP looks good F/U with PCP for BP management She will schedule f/u at check out     Otto Fairly, MD [facility]

## 2023-12-24 NOTE — Assessment & Plan Note (Signed)
 Right upper back lesion I reminded her that I had already discussed the result with her, and she was confusing this result with her scalp biopsy result, which was done a while back.  I reassured her this is a benign lesion and she can use the halobetasol  cream on it as well F/U with PCP for reassessment and medication refills as needed She agreed with the plan

## 2023-12-25 ENCOUNTER — Encounter: Payer: Self-pay | Admitting: Family

## 2023-12-26 ENCOUNTER — Other Ambulatory Visit: Payer: Self-pay | Admitting: Family Medicine

## 2023-12-26 DIAGNOSIS — G5601 Carpal tunnel syndrome, right upper limb: Secondary | ICD-10-CM

## 2023-12-28 ENCOUNTER — Other Ambulatory Visit: Payer: Self-pay | Admitting: Family Medicine

## 2023-12-28 DIAGNOSIS — E119 Type 2 diabetes mellitus without complications: Secondary | ICD-10-CM

## 2023-12-29 ENCOUNTER — Ambulatory Visit
Admission: RE | Admit: 2023-12-29 | Discharge: 2023-12-29 | Disposition: A | Discharge status: Home / Self Care | Payer: Medicaid Other | Source: Ambulatory Visit | Attending: Family | Admitting: Family

## 2023-12-29 DIAGNOSIS — M25871 Other specified joint disorders, right ankle and foot: Secondary | ICD-10-CM

## 2023-12-29 DIAGNOSIS — M19071 Primary osteoarthritis, right ankle and foot: Secondary | ICD-10-CM | POA: Diagnosis not present

## 2023-12-29 DIAGNOSIS — M25571 Pain in right ankle and joints of right foot: Secondary | ICD-10-CM | POA: Diagnosis not present

## 2023-12-29 DIAGNOSIS — R600 Localized edema: Secondary | ICD-10-CM | POA: Diagnosis not present

## 2024-01-01 ENCOUNTER — Other Ambulatory Visit: Payer: Medicaid Other

## 2024-01-01 NOTE — Progress Notes (Signed)
Do you mind offering her a mri review with duda to discuss

## 2024-01-04 ENCOUNTER — Encounter: Payer: Self-pay | Admitting: Podiatry

## 2024-01-04 ENCOUNTER — Ambulatory Visit: Payer: Medicaid Other | Admitting: Podiatry

## 2024-01-04 DIAGNOSIS — M2042 Other hammer toe(s) (acquired), left foot: Secondary | ICD-10-CM | POA: Diagnosis not present

## 2024-01-04 DIAGNOSIS — M7751 Other enthesopathy of right foot: Secondary | ICD-10-CM | POA: Diagnosis not present

## 2024-01-04 DIAGNOSIS — M25571 Pain in right ankle and joints of right foot: Secondary | ICD-10-CM

## 2024-01-04 MED ORDER — BETAMETHASONE SOD PHOS & ACET 6 (3-3) MG/ML IJ SUSP
3.0000 mg | Freq: Once | INTRAMUSCULAR | Status: AC
  Administered 2024-01-04: 3 mg via INTRA_ARTICULAR

## 2024-01-04 NOTE — Progress Notes (Signed)
Chief Complaint  Patient presents with   Toe Pain    Follow up capsulitis left   "Its this 2nd toe that hurts in my pointed shoes. It might be around the toenail, but he said that it was arthritis"    HPI: 63 y.o. female presenting today for evaluation of 2 separate complaints.  First the patient states that she has been dealing with symptomatic painful hammertoes to the left foot for several years now.  She does have a history of Lapidus bunionectomy and she says that the hammertoes were painful even before she had surgery.  She is unsure why the hammertoes were not corrected for her at the time of her bunion surgery.  She says that she has tried different shoe gear modifications and silicone padding with anti-inflammatories with no persistent relief of her symptoms.  Is affecting her on a daily basis  Past Medical History:  Diagnosis Date   Arthritis    right knee, lower back   Chest wall pain 04/20/2021   Dyspnea    very rare -tx with albuterol neb sol if needed   Hyperlipidemia    Hypertension    Intertrigo 08/06/2022   Pre-diabetes    per patient - does not check blood sugar   Right hip pain 05/05/2018   Rupture of anterior cruciate ligament of right knee 02/15/2018   Sprain of medial collateral ligament of right knee 02/15/2018   SVD (spontaneous vaginal delivery)    x 5 - only 2 living, 1 stillborn and 1 premature at 7 months demise,1 child deceased   Wears dentures    upper and lower    Past Surgical History:  Procedure Laterality Date   FOOT ARTHRODESIS Left 11/02/2020   Procedure: LEFT MIDFOOT FUSION BASE 1ST AND 2ND METATARSAL;  Surgeon: Nadara Mustard, MD;  Location: [facility] OR;  Service: Orthopedics;  Laterality: Left;   LUMBAR FUSION  05/20/2019   GILL PROCEDURE, LEFT TRANSFORAMINAL LUMBAR INTERBODY FUSION, PEDICLE INSTRUMENTATION, BILATERAL FUSION (N/A   TOTAL KNEE ARTHROPLASTY Right 08/21/2023   Procedure: RIGHT TOTAL KNEE ARTHROPLASTY;  Surgeon: Nadara Mustard, MD;   Location: [facility] OR;  Service: Orthopedics;  Laterality: Right;   TUBAL LIGATION      No Known Allergies   Physical Exam: General: The patient is alert and oriented x3 in no acute distress.  Dermatology: Skin is warm, dry and supple bilateral lower extremities.   Vascular: Palpable pedal pulses bilaterally. Capillary refill within normal limits.  No appreciable edema.  No erythema.  Neurological: Grossly intact via light touch  Musculoskeletal Exam: Today there is symptomatic hammertoes to the lesser digits of the left foot specifically the second third and fourth the second toe being the most symptomatic. There is also pain with palpation overlying the sinus tarsi of the right foot.  Pain with inversion and eversion of the subtalar joint as well.  There is not so much pain with palpation or range of motion to the ankle joint.  No crepitus noted.  Radiographic Exam LT foot 10/19/2023:  History of prior Lapidus type bunion surgery.  2 crossing screws with 1 traversing the first TMT and the second from the medial cuneiform to the base of the second metatarsal.  They appear stable and intact. Degenerative changes noted throughout the lesser TMT of the foot.  Hammertoe contracture deformity also noted to the lesser digits  MR Ankle Right w/o contrast 12/29/2023 IMPRESSION: 1. Mild edema within the flexor digitorum brevis muscle just deep to the  medial band of the plantar fascia approximately 2.4 cm distal from the plantar fascia origin. Otherwise, the plantar fascia origins are intact. There is a mild-to-moderate plantar calcaneal heel spur. 2. Mild-to-moderate chronic cystic change with mild surrounding marrow edema within the talus at the deltoid ligament insertion. This appears to be the sequela of chronic traction changes at the deltoid ligament insertion. 3. 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 4. Mild tibiotalar and moderate posterior aspect of the posterior subtalar joint  cartilage thinning.  Assessment/Plan of Care: 1.  Chronically symptomatic hammertoes 2, 3, 4 left foot 2.  History of Lapidus bunionectomy with Dr. Lajoyce Corners, [facility] 11/02/2020 3.  H/o right knee replacement.  Dr. Lajoyce Corners.  08/21/2023 4.  Sinus tarsitis right 5.  Ankle arthritis right  -Injection of 0.5 cc Celestone Soluspan injected into the sinus tarsi right foot -Today we discussed additional conservative versus surgical management of the chronically symptomatic hammertoes to the left foot.  This is been ongoing for several years.  Even prior to her bunion surgery she had symptomatic painful hammertoes.  She has tried conservative treatment including shoe gear modification and silicone cushions with no real alleviation of her symptoms.  I do believe at this time surgery is warranted.  Surgery was discussed in detail with the patient including risk benefits advantages and disadvantages.  Postoperative care was also discussed in detail as well.  Patient is amenable to surgery at this time and would like to proceed with correction of the hammertoes since it is affecting her quality life on a daily basis and she is unable to ambulate without pain -Authorization for surgery was initiated today.  Surgery will consist of hammertoe arthroplasty digits 2, 3, 4 of the left foot with possible MTP capsulotomy -Return to clinic 1 week postop  *Does not work       Felecia Shelling, DPM [facility]  Dr. Felecia Shelling, DPM    [address] [place], [place] [address]                Office [phone]  Fax [phone]

## 2024-01-09 ENCOUNTER — Other Ambulatory Visit: Payer: Self-pay | Admitting: Family Medicine

## 2024-01-11 ENCOUNTER — Telehealth: Payer: Self-pay | Admitting: Podiatry

## 2024-01-11 NOTE — Telephone Encounter (Signed)
DOS-01/28/24  HAMMER TOE REPAIR 2-4 LT- [id number]  [facility] EFFECTIVE DATE-08/16/23  PER AN INCOMING FAX FROM [facility], PRIOR AUTH HAS BEEN APPROVED FOR CPT CODE 78469 ( 3 UNITS). GOOD FROM 01/28/24 - 04/26/24.  AUTH REF #: [id number]

## 2024-01-16 DIAGNOSIS — Z419 Encounter for procedure for purposes other than remedying health state, unspecified: Secondary | ICD-10-CM | POA: Diagnosis not present

## 2024-01-18 ENCOUNTER — Other Ambulatory Visit: Payer: Self-pay | Admitting: Family Medicine

## 2024-01-18 DIAGNOSIS — G5601 Carpal tunnel syndrome, right upper limb: Secondary | ICD-10-CM

## 2024-01-21 ENCOUNTER — Ambulatory Visit (INDEPENDENT_AMBULATORY_CARE_PROVIDER_SITE_OTHER): Payer: Medicaid Other | Admitting: Orthopedic Surgery

## 2024-01-21 DIAGNOSIS — M1711 Unilateral primary osteoarthritis, right knee: Secondary | ICD-10-CM | POA: Diagnosis not present

## 2024-01-21 DIAGNOSIS — Z96651 Presence of right artificial knee joint: Secondary | ICD-10-CM | POA: Diagnosis not present

## 2024-01-25 ENCOUNTER — Encounter: Payer: Self-pay | Admitting: Orthopedic Surgery

## 2024-01-25 NOTE — Progress Notes (Signed)
 Office Visit Note   Patient: Sandra Lozano           Date of Birth: [birth year]           MRN: [id number] Visit Date: 01/21/2024              Requested by: Nicholas Bar, MD [address] [place],  [place] [address] PCP: Nicholas Bar, MD  Chief Complaint  Patient presents with   Right Ankle - Follow-up    MRI review       HPI: Patient is 5 months status post right total knee arthroplasty.  She complains of pain over the lateral ankle with swelling.  She has had a subtalar injection with relief.  Assessment & Plan: Visit Diagnoses:  1. Primary osteoarthritis of right knee   2. Status post right knee replacement     Plan: Recommended strengthening and toe raises were demonstrated.  Most likely her lateral ankle pain is coming from limping from the total knee arthroplasty.  Follow-Up Instructions: Return if symptoms worsen or fail to improve.   Ortho Exam  Patient is alert, oriented, no adenopathy, well-dressed, normal affect, normal respiratory effort. Examination patient has tenderness to palpation along the peroneus brevis.  Resisted eversion is tender.  There is no swelling.  Imaging: No results found. No images are attached to the encounter.  Labs: Lab Results  Component Value Date   HGBA1C 5.8 12/10/2023   HGBA1C 5.5 07/01/2023   HGBA1C 5.7 12/18/2022   ESRSEDRATE 11 11/26/2021   LABURIC 5.6 09/08/2023   LABURIC 5.2 11/26/2021     Lab Results  Component Value Date   ALBUMIN  4.0 05/18/2019    No results found for: MG No results found for: VD25OH  No results found for: PREALBUMIN    Latest Ref Rng & Units 08/13/2023    9:10 AM 07/01/2023    9:06 AM 11/02/2020    8:05 AM  CBC EXTENDED  WBC 4.0 - 10.5 K/uL 6.0  6.1    RBC 3.87 - 5.11 MIL/uL 4.57  4.38    Hemoglobin 12.0 - 15.0 g/dL 86.8  87.6  85.3   HCT 36.0 - 46.0 % 40.7  38.5  43.0   Platelets 150 - 400 K/uL 272  296       There is no height or weight on file to calculate  BMI.  Orders:  No orders of the defined types were placed in this encounter.  No orders of the defined types were placed in this encounter.    Procedures: No procedures performed  Clinical Data: No additional findings.  ROS:  All other systems negative, except as noted in the HPI. Review of Systems  Objective: Vital Signs: LMP  (LMP Unknown)   Specialty Comments:  EXAM: MRI LUMBAR SPINE WITHOUT CONTRAST   TECHNIQUE: Multiplanar, multisequence MR imaging of the lumbar spine was performed. No intravenous contrast was administered.   COMPARISON:  Lumbar radiographs 09/28/2018   FINDINGS: Segmentation:  Normal   Alignment:  7 mm anterolisthesis L4-5.  Remaining alignment normal   Vertebrae: Negative for fracture or mass. Bone marrow edema at L4-5 related to discogenic change.   Conus medullaris and cauda equina: Conus extends to the L1-2 level. Conus and cauda equina appear normal.   Paraspinal and other soft tissues: Negative for paraspinous mass or fluid collection   Disc levels:   L1-2: Negative   L2-3: Negative   L3-4: Mild facet degeneration. Negative for disc protrusion or stenosis  L4-5: 7 mm anterolisthesis. Severe facet degeneration bilaterally. Severe disc degeneration with endplate erosive change and edema. Severe spinal stenosis. Severe subarticular foraminal stenosis left greater than right   L5-S1: Negative   IMPRESSION: Severe spinal stenosis L4-5 with severe subarticular foraminal stenosis bilaterally left greater than right. Grade 1 anterolisthesis L4-5. Remaining lumbar spine negative     Electronically Signed   By: Carlin Gaskins M.D.   On: 11/13/2018 15:34  PMFS History: Patient Active Problem List   Diagnosis Date Noted   Benign lichenoid keratosis 12/24/2023   Bruising 12/24/2023   Unilateral primary osteoarthritis, right knee 08/21/2023   Total knee replacement status, right 08/21/2023   Hidradenitis suppurativa of  right axilla 06/03/2023   Vaginal irritation 02/21/2023   Tinea 12/18/2022   Left shoulder pain 08/25/2022   DJD (degenerative joint disease), multiple sites 08/06/2022   COPD, mild (HCC) 02/11/2022   Carpal tunnel syndrome, bilateral 12/21/2021   Arthritis of finger of left hand 11/26/2021   Traumatic arthritis of left foot    Lichen simplex chronicus 06/04/2020   Arthritis of midfoot 03/14/2020   History of lumbar fusion 03/14/2020   Well controlled diabetes mellitus (HCC) 11/07/2019   Nummular eczema 07/06/2019   Lumbar stenosis 05/20/2019   Acute left-sided low back pain without sciatica 07/18/2018   Osteoarthritis of right hip 06/03/2018   Chronic right shoulder pain 05/05/2018   Essential hypertension 02/15/2018   Past Medical History:  Diagnosis Date   Arthritis    right knee, lower back   Chest wall pain 04/20/2021   Dyspnea    very rare -tx with albuterol  neb sol if needed   Hyperlipidemia    Hypertension    Intertrigo 08/06/2022   Pre-diabetes    per patient - does not check blood sugar   Right hip pain 05/05/2018   Rupture of anterior cruciate ligament of right knee 02/15/2018   Sprain of medial collateral ligament of right knee 02/15/2018   SVD (spontaneous vaginal delivery)    x 5 - only 2 living, 1 stillborn and 1 premature at 7 months demise,1 child deceased   Wears dentures    upper and lower    Family History  Problem Relation Age of Onset   Cancer Sister    Healthy Daughter    Healthy Daughter     Past Surgical History:  Procedure Laterality Date   FOOT ARTHRODESIS Left 11/02/2020   Procedure: LEFT MIDFOOT FUSION BASE 1ST AND 2ND METATARSAL;  Surgeon: Harden Jerona GAILS, MD;  Location: [facility] OR;  Service: Orthopedics;  Laterality: Left;   LUMBAR FUSION  05/20/2019   GILL PROCEDURE, LEFT TRANSFORAMINAL LUMBAR INTERBODY FUSION, PEDICLE INSTRUMENTATION, BILATERAL FUSION (N/A   TOTAL KNEE ARTHROPLASTY Right 08/21/2023   Procedure: RIGHT TOTAL KNEE  ARTHROPLASTY;  Surgeon: Harden Jerona GAILS, MD;  Location: [facility] OR;  Service: Orthopedics;  Laterality: Right;   TUBAL LIGATION     Social History   Occupational History   Not on file  Tobacco Use   Smoking status: Former    Current packs/day: 0.00    Average packs/day: 1 pack/day for 30.0 years (30.0 ttl pk-yrs)    Types: Cigarettes    Start date: 01/1985    Quit date: 01/2015    Years since quitting: 9.0    Passive exposure: Past   Smokeless tobacco: Never  Vaping Use   Vaping status: Never Used  Substance and Sexual Activity   Alcohol use: Yes    Comment: occ   Drug use:  Yes    Types: Marijuana   Sexual activity: Not on file

## 2024-01-27 ENCOUNTER — Other Ambulatory Visit: Payer: Self-pay | Admitting: Family Medicine

## 2024-01-27 DIAGNOSIS — G5601 Carpal tunnel syndrome, right upper limb: Secondary | ICD-10-CM

## 2024-01-28 ENCOUNTER — Other Ambulatory Visit: Payer: Self-pay | Admitting: Podiatry

## 2024-01-28 DIAGNOSIS — M2042 Other hammer toe(s) (acquired), left foot: Secondary | ICD-10-CM | POA: Diagnosis not present

## 2024-01-28 DIAGNOSIS — M2041 Other hammer toe(s) (acquired), right foot: Secondary | ICD-10-CM | POA: Diagnosis not present

## 2024-01-28 MED ORDER — IBUPROFEN 800 MG PO TABS: 800.0000 mg | ORAL_TABLET | Freq: Three times a day (TID) | ORAL | 1 refills | Status: DC

## 2024-01-28 MED ORDER — OXYCODONE-ACETAMINOPHEN 5-325 MG PO TABS: 1.0000 | ORAL_TABLET | ORAL | 0 refills | Status: DC | PRN

## 2024-01-28 NOTE — Progress Notes (Signed)
PRN postop

## 2024-02-01 ENCOUNTER — Ambulatory Visit (INDEPENDENT_AMBULATORY_CARE_PROVIDER_SITE_OTHER): Payer: Medicaid Other | Admitting: Student

## 2024-02-01 VITALS — BP 144/86 | HR 82 | Wt 180.8 lb

## 2024-02-01 DIAGNOSIS — M25571 Pain in right ankle and joints of right foot: Secondary | ICD-10-CM

## 2024-02-01 DIAGNOSIS — G8929 Other chronic pain: Secondary | ICD-10-CM | POA: Insufficient documentation

## 2024-02-01 NOTE — Assessment & Plan Note (Addendum)
Patient appreciates right ankle pain, and swelling that is been an issue going on for months.  Patient appreciates since having her knee surgery she has appreciated pain in her ankle, and is also been off balance since having her toe surgery on the opposite foot.  Patient was using meloxicam daily, but was told not to do this, and is now taking ibuprofen for her toe surgery.  Discussed not taking both meds at the same time, and potential need for physical therapy.  Patient felt that the provider that did her surgery, would send her to physical therapy.  Discussed that I would send her to PT if the other provider did not.  Will also try lidocaine patches on ankle, to help with pain. - Lidocaine patches - Consider PT

## 2024-02-01 NOTE — Patient Instructions (Signed)
It was great to see you! Thank you for allowing me to participate in your care!  I recommend that you always bring your medications to each appointment as this makes it easy to ensure we are on the correct medications and helps [place] not miss when refills are needed.  Our plans for today:  - Meloxicam  We are going to hold off of the meloxicam, while you are on Ibuprofen. These are the similar meds and can affect your stomach poorly, and taking both could cause you to overdose on them.  I think you most likely need PT for your ankle. If your provider who did the toe surgery doesn't send you, call the clinic and I will send you.      Take care and seek immediate care sooner if you develop any concerns.   Dr. Bess Kinds, MD [facility]

## 2024-02-01 NOTE — Progress Notes (Signed)
  SUBJECTIVE:   CHIEF COMPLAINT / HPI:   Meloxicam Has been taking it for her ankle. Has arthritis in her ankle, and feels ankle stiffness and inflammation, and it is discomfort daily. Just had her toe surgery on left foot last Thursday and is taking ibuprofen. Has follow up on Wednesday w/ doctor. Is hoping they will send her to PT. Is appreciating the pain is in her right ankle and is more of an issue now that she is wearing a boot for her toe surgery.    PERTINENT  PMH / PSH:    OBJECTIVE:  LMP  (LMP Unknown)  Physical Exam Musculoskeletal:     Right ankle: No swelling, deformity, ecchymosis or lacerations. Tenderness present. Normal range of motion.      ASSESSMENT/PLAN:   Assessment & Plan Chronic pain of right ankle Patient appreciates right ankle pain, and swelling that is been an issue going on for months.  Patient appreciates since having her knee surgery she has appreciated pain in her ankle, and is also been off balance since having her toe surgery on the opposite foot.  Patient was using meloxicam daily, but was told not to do this, and is now taking ibuprofen for her toe surgery.  Discussed not taking both meds at the same time, and potential need for physical therapy.  Patient felt that the provider that did her surgery, would send her to physical therapy.  Discussed that I would send her to PT if the other provider did not.  Will also try lidocaine patches on ankle, to help with pain. - Lidocaine patches - Consider PT No follow-ups on file. Bess Kinds, MD 02/01/2024, 7:01 AM PGY-3, [facility]

## 2024-02-03 ENCOUNTER — Ambulatory Visit (INDEPENDENT_AMBULATORY_CARE_PROVIDER_SITE_OTHER): Payer: Medicaid Other | Admitting: Podiatry

## 2024-02-03 ENCOUNTER — Ambulatory Visit (INDEPENDENT_AMBULATORY_CARE_PROVIDER_SITE_OTHER): Payer: Medicaid Other

## 2024-02-03 ENCOUNTER — Encounter: Payer: Self-pay | Admitting: Podiatry

## 2024-02-03 DIAGNOSIS — M2042 Other hammer toe(s) (acquired), left foot: Secondary | ICD-10-CM

## 2024-02-03 DIAGNOSIS — Z9889 Other specified postprocedural states: Secondary | ICD-10-CM

## 2024-02-03 NOTE — Progress Notes (Signed)
   Chief Complaint  Patient presents with   Routine Post Op    POV # 1 DOS 01/28/24 --- HAMMERTOE REPAIR DIGITS 2-4 LEFT FOOT   "Its just a little discomfort, not bad at all"    Subjective:  Patient presents today status post hammertoe repair digits 2-4 of the left foot.  DOS: 01/28/2024.  Patient doing well.  Minimal WBAT in the cam boot as instructed with the assistance of a knee scooter.  Pain is minimal.  She states that she really does not have any pain just some moderate discomfort  Past Medical History:  Diagnosis Date   Arthritis    right knee, lower back   Chest wall pain 04/20/2021   Dyspnea    very rare -tx with albuterol neb sol if needed   Hyperlipidemia    Hypertension    Intertrigo 08/06/2022   Pre-diabetes    per patient - does not check blood sugar   Right hip pain 05/05/2018   Rupture of anterior cruciate ligament of right knee 02/15/2018   Sprain of medial collateral ligament of right knee 02/15/2018   SVD (spontaneous vaginal delivery)    x 5 - only 2 living, 1 stillborn and 1 premature at 7 months demise,1 child deceased   Wears dentures    upper and lower    Past Surgical History:  Procedure Laterality Date   FOOT ARTHRODESIS Left 11/02/2020   Procedure: LEFT MIDFOOT FUSION BASE 1ST AND 2ND METATARSAL;  Surgeon: Nadara Mustard, MD;  Location: [facility] OR;  Service: Orthopedics;  Laterality: Left;   LUMBAR FUSION  05/20/2019   GILL PROCEDURE, LEFT TRANSFORAMINAL LUMBAR INTERBODY FUSION, PEDICLE INSTRUMENTATION, BILATERAL FUSION (N/A   TOTAL KNEE ARTHROPLASTY Right 08/21/2023   Procedure: RIGHT TOTAL KNEE ARTHROPLASTY;  Surgeon: Nadara Mustard, MD;  Location: [facility] OR;  Service: Orthopedics;  Laterality: Right;   TUBAL LIGATION      No Known Allergies  Objective/Physical Exam Neurovascular status intact.  Incision well coapted with sutures intact. No sign of infectious process noted. No dehiscence. No active bleeding noted.  Moderate edema noted to the surgical  extremity.  Radiographic Exam LT foot 02/03/2024:  Arthroplasties noted digits 2-4 with percutaneous pin fixation.  Toes are in good rectus alignment  Assessment: 1. s/p hammertoe repair 2-4 LT. DOS: 01/28/2024   Plan of Care:  -Patient was evaluated. X-rays reviewed - Dressings changed.  CDI x 1 week -Continue minimal WBAT cam boot with the assistance of the knee scooter -RTC 1 week possible suture removal   Felecia Shelling, DPM [facility]  Dr. Felecia Shelling, DPM    [address] [place], [place] [address]                Office [phone]  Fax [phone]

## 2024-02-10 ENCOUNTER — Ambulatory Visit (INDEPENDENT_AMBULATORY_CARE_PROVIDER_SITE_OTHER): Payer: Medicaid Other | Admitting: Podiatry

## 2024-02-10 DIAGNOSIS — M2042 Other hammer toe(s) (acquired), left foot: Secondary | ICD-10-CM

## 2024-02-10 NOTE — Progress Notes (Signed)
   Chief Complaint  Patient presents with   Routine Post Op    RM#8 POV # 2 DOS 01/28/24 --- HAMMERTOE REPAIR DIGITS 2-4 LEFT FOOT- patient states doing well no pain doesn't want to continue the narcotic only wants ibuprofen refilled.    Subjective:  Patient presents today status post hammertoe repair digits 2-4 of the left foot.  DOS: 01/28/2024.  Patient doing very well.  She only takes ibuprofen for the pain.  She is no longer taking any opioid medication.  She has been minimally weightbearing in the cam boot with the knee scooter.  Past Medical History:  Diagnosis Date   Arthritis    right knee, lower back   Chest wall pain 04/20/2021   Dyspnea    very rare -tx with albuterol neb sol if needed   Hyperlipidemia    Hypertension    Intertrigo 08/06/2022   Pre-diabetes    per patient - does not check blood sugar   Right hip pain 05/05/2018   Rupture of anterior cruciate ligament of right knee 02/15/2018   Sprain of medial collateral ligament of right knee 02/15/2018   SVD (spontaneous vaginal delivery)    x 5 - only 2 living, 1 stillborn and 1 premature at 7 months demise,1 child deceased   Wears dentures    upper and lower    Past Surgical History:  Procedure Laterality Date   FOOT ARTHRODESIS Left 11/02/2020   Procedure: LEFT MIDFOOT FUSION BASE 1ST AND 2ND METATARSAL;  Surgeon: Nadara Mustard, MD;  Location: [facility] OR;  Service: Orthopedics;  Laterality: Left;   LUMBAR FUSION  05/20/2019   GILL PROCEDURE, LEFT TRANSFORAMINAL LUMBAR INTERBODY FUSION, PEDICLE INSTRUMENTATION, BILATERAL FUSION (N/A   TOTAL KNEE ARTHROPLASTY Right 08/21/2023   Procedure: RIGHT TOTAL KNEE ARTHROPLASTY;  Surgeon: Nadara Mustard, MD;  Location: [facility] OR;  Service: Orthopedics;  Laterality: Right;   TUBAL LIGATION      No Known Allergies  Objective/Physical Exam Neurovascular status intact.  Incision well coapted with sutures intact. No sign of infectious process noted. No dehiscence. No active bleeding  noted.  Moderate edema noted to the surgical extremity.  Radiographic Exam LT foot 02/03/2024:  Arthroplasties noted digits 2-4 with percutaneous pin fixation.  Toes are in good rectus alignment  Assessment: 1. s/p hammertoe repair 2-4 LT. DOS: 01/28/2024   Plan of Care:  -Patient was evaluated.  -Sutures removed -Continue minimal WBAT cam boot -Return to clinic 2 weeks percutaneous pin removal and follow-up x-rays  Felecia Shelling, DPM [facility]  Dr. Felecia Shelling, DPM    [address] [place], [place] [address]                Office [phone]  Fax [phone]

## 2024-02-11 DIAGNOSIS — H1045 Other chronic allergic conjunctivitis: Secondary | ICD-10-CM | POA: Diagnosis not present

## 2024-02-11 DIAGNOSIS — H2513 Age-related nuclear cataract, bilateral: Secondary | ICD-10-CM | POA: Diagnosis not present

## 2024-02-11 DIAGNOSIS — E119 Type 2 diabetes mellitus without complications: Secondary | ICD-10-CM | POA: Diagnosis not present

## 2024-02-11 DIAGNOSIS — H04123 Dry eye syndrome of bilateral lacrimal glands: Secondary | ICD-10-CM | POA: Diagnosis not present

## 2024-02-11 LAB — HM DIABETES EYE EXAM

## 2024-02-13 DIAGNOSIS — Z419 Encounter for procedure for purposes other than remedying health state, unspecified: Secondary | ICD-10-CM | POA: Diagnosis not present

## 2024-02-24 ENCOUNTER — Ambulatory Visit (INDEPENDENT_AMBULATORY_CARE_PROVIDER_SITE_OTHER): Payer: Medicaid Other | Admitting: Podiatry

## 2024-02-24 ENCOUNTER — Encounter: Payer: Self-pay | Admitting: Podiatry

## 2024-02-24 ENCOUNTER — Ambulatory Visit (INDEPENDENT_AMBULATORY_CARE_PROVIDER_SITE_OTHER)

## 2024-02-24 DIAGNOSIS — M2042 Other hammer toe(s) (acquired), left foot: Secondary | ICD-10-CM

## 2024-02-24 DIAGNOSIS — M79672 Pain in left foot: Secondary | ICD-10-CM

## 2024-02-24 NOTE — Progress Notes (Signed)
   Chief Complaint  Patient presents with   Routine Post Op    Patient state that everything has been the same since last visit, very little pain in 2nd toe left foot but nothing major patient states     Subjective:  Patient presents today status post hammertoe repair digits 2-4 of the left foot.  DOS: 01/28/2024.  Continues to do well.  No new complaints.  WBAT cam boot as instructed  Past Medical History:  Diagnosis Date   Arthritis    right knee, lower back   Chest wall pain 04/20/2021   Dyspnea    very rare -tx with albuterol neb sol if needed   Hyperlipidemia    Hypertension    Intertrigo 08/06/2022   Pre-diabetes    per patient - does not check blood sugar   Right hip pain 05/05/2018   Rupture of anterior cruciate ligament of right knee 02/15/2018   Sprain of medial collateral ligament of right knee 02/15/2018   SVD (spontaneous vaginal delivery)    x 5 - only 2 living, 1 stillborn and 1 premature at 7 months demise,1 child deceased   Wears dentures    upper and lower    Past Surgical History:  Procedure Laterality Date   FOOT ARTHRODESIS Left 11/02/2020   Procedure: LEFT MIDFOOT FUSION BASE 1ST AND 2ND METATARSAL;  Surgeon: Nadara Mustard, MD;  Location: [facility] OR;  Service: Orthopedics;  Laterality: Left;   LUMBAR FUSION  05/20/2019   GILL PROCEDURE, LEFT TRANSFORAMINAL LUMBAR INTERBODY FUSION, PEDICLE INSTRUMENTATION, BILATERAL FUSION (N/A   TOTAL KNEE ARTHROPLASTY Right 08/21/2023   Procedure: RIGHT TOTAL KNEE ARTHROPLASTY;  Surgeon: Nadara Mustard, MD;  Location: [facility] OR;  Service: Orthopedics;  Laterality: Right;   TUBAL LIGATION      No Known Allergies  Objective/Physical Exam Neurovascular status intact.  Incision well coapted with sutures intact. No sign of infectious process noted. No dehiscence. No active bleeding noted.  Minimal edema noted  Radiographic Exam LT foot 312 2025:  Unchanged.  Arthroplasties noted digits 2-4 with percutaneous pin fixation.  Toes  are in good rectus alignment  Assessment: 1. s/p hammertoe repair 2-4 LT. DOS: 01/28/2024   Plan of Care:  -Patient was evaluated.  - Percutaneous fixation pins removed today. -Patient may now begin to transition out of the cam boot into good supportive tennis shoes and sneakers -Return to clinic 1 month  Felecia Shelling, DPM [facility]  Dr. Felecia Shelling, DPM    [address] [place], [place] [address]                Office [phone]  Fax [phone]

## 2024-03-17 ENCOUNTER — Telehealth: Payer: Self-pay | Admitting: Physical Medicine and Rehabilitation

## 2024-03-17 ENCOUNTER — Other Ambulatory Visit: Payer: Self-pay | Admitting: Physical Medicine and Rehabilitation

## 2024-03-17 ENCOUNTER — Other Ambulatory Visit: Payer: Self-pay

## 2024-03-17 DIAGNOSIS — I10 Essential (primary) hypertension: Secondary | ICD-10-CM

## 2024-03-17 DIAGNOSIS — M5416 Radiculopathy, lumbar region: Secondary | ICD-10-CM

## 2024-03-17 NOTE — Telephone Encounter (Signed)
Patient called. Would like an appointment with [facility].

## 2024-03-21 ENCOUNTER — Other Ambulatory Visit: Payer: Self-pay | Admitting: Family Medicine

## 2024-03-21 DIAGNOSIS — E785 Hyperlipidemia, unspecified: Secondary | ICD-10-CM

## 2024-03-21 DIAGNOSIS — I10 Essential (primary) hypertension: Secondary | ICD-10-CM

## 2024-03-22 ENCOUNTER — Ambulatory Visit: Payer: Medicaid Other | Admitting: Dermatology

## 2024-03-26 DIAGNOSIS — Z419 Encounter for procedure for purposes other than remedying health state, unspecified: Secondary | ICD-10-CM | POA: Diagnosis not present

## 2024-03-28 ENCOUNTER — Telehealth: Payer: Self-pay | Admitting: Physical Medicine and Rehabilitation

## 2024-03-28 ENCOUNTER — Ambulatory Visit: Admitting: Student

## 2024-03-28 NOTE — Telephone Encounter (Signed)
 Patient was here. Says she needs an appointment with Dr. Daisey Dryer .

## 2024-03-31 ENCOUNTER — Other Ambulatory Visit (INDEPENDENT_AMBULATORY_CARE_PROVIDER_SITE_OTHER)

## 2024-03-31 ENCOUNTER — Encounter: Payer: Self-pay | Admitting: Orthopedic Surgery

## 2024-03-31 ENCOUNTER — Ambulatory Visit: Admitting: Orthopedic Surgery

## 2024-03-31 DIAGNOSIS — G8929 Other chronic pain: Secondary | ICD-10-CM | POA: Diagnosis not present

## 2024-03-31 DIAGNOSIS — M25511 Pain in right shoulder: Secondary | ICD-10-CM | POA: Diagnosis not present

## 2024-03-31 DIAGNOSIS — M25571 Pain in right ankle and joints of right foot: Secondary | ICD-10-CM

## 2024-03-31 MED ORDER — LIDOCAINE HCL 1 % IJ SOLN
5.0000 mL | INTRAMUSCULAR | Status: AC | PRN
Start: 2024-03-31 — End: 2024-03-31
  Administered 2024-03-31: 5 mL

## 2024-03-31 MED ORDER — METHYLPREDNISOLONE ACETATE 40 MG/ML IJ SUSP
40.0000 mg | INTRAMUSCULAR | Status: AC | PRN
  Administered 2024-03-31: 40 mg via INTRA_ARTICULAR

## 2024-03-31 MED ORDER — LIDOCAINE HCL 1 % IJ SOLN
2.0000 mL | INTRAMUSCULAR | Status: AC | PRN
  Administered 2024-03-31: 2 mL

## 2024-03-31 NOTE — Progress Notes (Signed)
 Office Visit Note   Patient: Sandra Lozano           Date of Birth: [birth year]           MRN: [id number] Visit Date: 03/31/2024              Requested by: Lockie Mola, MD [address] [place],  [place] [address] PCP: Lockie Mola, MD  Chief Complaint  Patient presents with   Right Shoulder - Pain      HPI: Patient is a 63 year old woman who presents with chronic right shoulder pain which she states is anteriorly.  She has pain with elevation pain with range of motion.  Pain with trying to sleep.  Patient states she still has persistent pain over the peroneal tendons on the right.  She has tried immobilization without relief and has undergone an MRI scan which showed no tendon or bone abnormality.  Assessment & Plan: Visit Diagnoses:  1. Chronic right shoulder pain   2. Pain in right ankle and joints of right foot     Plan: The peroneal tendons were injected on the right and the right subacromial space was injected.  She tolerated this well.  Follow-Up Instructions: No follow-ups on file.   Ortho Exam  Patient is alert, oriented, no adenopathy, well-dressed, normal affect, normal respiratory effort. Examination of the right shoulder patient has full abduction and flexion.  She has pain reproduced with Neer and Hawkins impingement test.  Biceps tendon is tender to palpation.  Examination the right ankle there is no swelling.  The peroneal tendons are tender to palpation there is no swelling over the tendons no cystic mass.  She has good eversion strength.  Imaging: XR Shoulder Right Result Date: 03/31/2024 2 view radiographs of the right shoulder shows a congruent joint with no superior migration of the humeral head.  No images are attached to the encounter.  Labs: Lab Results  Component Value Date   HGBA1C 5.8 12/10/2023   HGBA1C 5.5 07/01/2023   HGBA1C 5.7 12/18/2022   ESRSEDRATE 11 11/26/2021   LABURIC 5.6 09/08/2023   LABURIC 5.2 11/26/2021     Lab  Results  Component Value Date   ALBUMIN 4.0 05/18/2019    No results found for: "MG" No results found for: "VD25OH"  No results found for: "PREALBUMIN"    Latest Ref Rng & Units 08/13/2023    9:10 AM 07/01/2023    9:06 AM 11/02/2020    8:05 AM  CBC EXTENDED  WBC 4.0 - 10.5 K/uL 6.0  6.1    RBC 3.87 - 5.11 MIL/uL 4.57  4.38    Hemoglobin 12.0 - 15.0 g/dL 19.1  47.8  29.5   HCT 36.0 - 46.0 % 40.7  38.5  43.0   Platelets 150 - 400 K/uL 272  296       There is no height or weight on file to calculate BMI.  Orders:  Orders Placed This Encounter  Procedures   XR Shoulder Right   No orders of the defined types were placed in this encounter.    Procedures: Large Joint Inj: R subacromial bursa on 03/31/2024 11:45 AM Indications: diagnostic evaluation and pain Details: 22 G 1.5 in needle, posterior approach  Arthrogram: No  Medications: 5 mL lidocaine 1 %; 40 mg methylPREDNISolone acetate 40 MG/ML Outcome: tolerated well, no immediate complications Procedure, treatment alternatives, risks and benefits explained, specific risks discussed. Consent was given by the patient. Immediately prior to procedure a time out  was called to verify the correct patient, procedure, equipment, support staff and site/side marked as required. Patient was prepped and draped in the usual sterile fashion.    Medium Joint Inj: R ankle on 03/31/2024 11:47 AM Indications: pain and diagnostic evaluation Details: 22 G 1.5 in needle, lateral approach Medications: 2 mL lidocaine 1 %; 40 mg methylPREDNISolone acetate 40 MG/ML Outcome: tolerated well, no immediate complications Procedure, treatment alternatives, risks and benefits explained, specific risks discussed. Consent was given by the patient. Immediately prior to procedure a time out was called to verify the correct patient, procedure, equipment, support staff and site/side marked as required. Patient was prepped and draped in the usual sterile fashion.       Clinical Data: No additional findings.  ROS:  All other systems negative, except as noted in the HPI. Review of Systems  Objective: Vital Signs: LMP  (LMP Unknown)   Specialty Comments:  EXAM: MRI LUMBAR SPINE WITHOUT CONTRAST   TECHNIQUE: Multiplanar, multisequence MR imaging of the lumbar spine was performed. No intravenous contrast was administered.   COMPARISON:  Lumbar radiographs 09/28/2018   FINDINGS: Segmentation:  Normal   Alignment:  7 mm anterolisthesis L4-5.  Remaining alignment normal   Vertebrae: Negative for fracture or mass. Bone marrow edema at L4-5 related to discogenic change.   Conus medullaris and cauda equina: Conus extends to the L1-2 level. Conus and cauda equina appear normal.   Paraspinal and other soft tissues: Negative for paraspinous mass or fluid collection   Disc levels:   L1-2: Negative   L2-3: Negative   L3-4: Mild facet degeneration. Negative for disc protrusion or stenosis   L4-5: 7 mm anterolisthesis. Severe facet degeneration bilaterally. Severe disc degeneration with endplate erosive change and edema. Severe spinal stenosis. Severe subarticular foraminal stenosis left greater than right   L5-S1: Negative   IMPRESSION: Severe spinal stenosis L4-5 with severe subarticular foraminal stenosis bilaterally left greater than right. Grade 1 anterolisthesis L4-5. Remaining lumbar spine negative     Electronically Signed   By: Marlan Palau M.D.   On: 11/13/2018 15:34  PMFS History: Patient Active Problem List   Diagnosis Date Noted   Chronic pain of right ankle 02/01/2024   Benign lichenoid keratosis 12/24/2023   Bruising 12/24/2023   Unilateral primary osteoarthritis, right knee 08/21/2023   Total knee replacement status, right 08/21/2023   Hidradenitis suppurativa of right axilla 06/03/2023   Vaginal irritation 02/21/2023   Tinea 12/18/2022   Left shoulder pain 08/25/2022   DJD (degenerative joint  disease), multiple sites 08/06/2022   COPD, mild (HCC) 02/11/2022   Carpal tunnel syndrome, bilateral 12/21/2021   Arthritis of finger of left hand 11/26/2021   Traumatic arthritis of left foot    Lichen simplex chronicus 06/04/2020   Arthritis of midfoot 03/14/2020   History of lumbar fusion 03/14/2020   Well controlled diabetes mellitus (HCC) 11/07/2019   Nummular eczema 07/06/2019   Lumbar stenosis 05/20/2019   Acute left-sided low back pain without sciatica 07/18/2018   Osteoarthritis of right hip 06/03/2018   Chronic right shoulder pain 05/05/2018   Essential hypertension 02/15/2018   Past Medical History:  Diagnosis Date   Arthritis    right knee, lower back   Chest wall pain 04/20/2021   Dyspnea    very rare -tx with albuterol neb sol if needed   Hyperlipidemia    Hypertension    Intertrigo 08/06/2022   Pre-diabetes    per patient - does not check blood sugar  Right hip pain 05/05/2018   Rupture of anterior cruciate ligament of right knee 02/15/2018   Sprain of medial collateral ligament of right knee 02/15/2018   SVD (spontaneous vaginal delivery)    x 5 - only 2 living, 1 stillborn and 1 premature at 7 months demise,1 child deceased   Wears dentures    upper and lower    Family History  Problem Relation Age of Onset   Cancer Sister    Healthy Daughter    Healthy Daughter     Past Surgical History:  Procedure Laterality Date   FOOT ARTHRODESIS Left 11/02/2020   Procedure: LEFT MIDFOOT FUSION BASE 1ST AND 2ND METATARSAL;  Surgeon: Timothy Ford, MD;  Location: [facility] OR;  Service: Orthopedics;  Laterality: Left;   LUMBAR FUSION  05/20/2019   GILL PROCEDURE, LEFT TRANSFORAMINAL LUMBAR INTERBODY FUSION, PEDICLE INSTRUMENTATION, BILATERAL FUSION (N/A   TOTAL KNEE ARTHROPLASTY Right 08/21/2023   Procedure: RIGHT TOTAL KNEE ARTHROPLASTY;  Surgeon: Timothy Ford, MD;  Location: [facility] OR;  Service: Orthopedics;  Laterality: Right;   TUBAL LIGATION     Social History    Occupational History   Not on file  Tobacco Use   Smoking status: Former    Current packs/day: 0.00    Average packs/day: 1 pack/day for 30.0 years (30.0 ttl pk-yrs)    Types: Cigarettes    Start date: 01/1985    Quit date: 01/2015    Years since quitting: 9.2    Passive exposure: Past   Smokeless tobacco: Never  Vaping Use   Vaping status: Never Used  Substance and Sexual Activity   Alcohol use: Yes    Comment: occ   Drug use: Yes    Types: Marijuana   Sexual activity: Not on file

## 2024-04-04 ENCOUNTER — Ambulatory Visit (INDEPENDENT_AMBULATORY_CARE_PROVIDER_SITE_OTHER): Admitting: Student

## 2024-04-04 VITALS — BP 124/70 | HR 96 | Temp 98.3°F | Wt 173.4 lb

## 2024-04-04 DIAGNOSIS — E119 Type 2 diabetes mellitus without complications: Secondary | ICD-10-CM | POA: Diagnosis not present

## 2024-04-04 LAB — POCT GLYCOSYLATED HEMOGLOBIN (HGB A1C): Hemoglobin A1C: 5.5 % (ref 4.0–5.6)

## 2024-04-04 MED ORDER — OZEMPIC (0.25 OR 0.5 MG/DOSE) 2 MG/3ML ~~LOC~~ SOPN: 0.2500 mg | PEN_INJECTOR | SUBCUTANEOUS | Status: DC

## 2024-04-04 NOTE — Patient Instructions (Addendum)
 It was great to see you today! Thank you for choosing [facility] for your primary care.  Today we addressed: Your A1c is 5.5. This is great! We are stopping Ozempic . Remember, 150 minutes of MODERATE intensity exercise weekly and strict diet. You're doing awesome, keep up the good work!  If you haven't already, sign up for My Chart to have easy access to your labs results, and communication with your primary care physician.  Return if symptoms worsen or fail to improve. Please arrive 15 minutes before your appointment to ensure smooth check in process.  We appreciate your efforts in making this happen.  Thank you for allowing me to participate in your care, Veronia Goon, DO 04/04/2024, 12:09 PM PGY-3, [facility]

## 2024-04-04 NOTE — Assessment & Plan Note (Signed)
 Explained that stopping Ozempic  without addressing unhealthy eating habits and lack of exercise may lead to weight regain, primarily as fat rather than muscle.  - Discontinue Ozempic  after current supply is finished - Continue metformin  for glycemic control - Monitor A1c levels periodically - Emphasize adherence to a Mediterranean diet - Encourage 150 minutes of moderate-intensity exercise per week - Incorporate strength training exercises to maintain muscle mass

## 2024-04-04 NOTE — Progress Notes (Signed)
  SUBJECTIVE:   CHIEF COMPLAINT / HPI:   Prediabetes: Home medications include: Metformin  500 mg daily and Ozempic  0.25 mg weekly. Does endorse compliance. Patient is up to date on diabetic eye exam. Her weight was highest at 230 and is now 173 lbs. The patient reports a change in diet, eating smaller meals throughout the day, and has been trying to gain muscle back. The patient also reports a decrease in physical activity during the winter months. The patient has stopped smoking weed and reports feeling good overall. The patient is considering discontinuing Ozempic  due to the significant weight loss and potential muscle loss.  PERTINENT  PMH / PSH: HTN, prediabetes, COPD  OBJECTIVE:  BP 124/70   Pulse 96   Temp 98.3 F (36.8 C)   Wt 173 lb 6.4 oz (78.7 kg)   LMP  (LMP Unknown)   SpO2 100%   BMI 29.76 kg/m  General: Well-appearing, NAD  ASSESSMENT/PLAN:   Assessment & Plan Well controlled diabetes mellitus (HCC) Explained that stopping Ozempic  without addressing unhealthy eating habits and lack of exercise may lead to weight regain, primarily as fat rather than muscle.  - Discontinue Ozempic  after current supply is finished - Continue metformin  for glycemic control - Monitor A1c levels periodically - Emphasize adherence to a Mediterranean diet - Encourage 150 minutes of moderate-intensity exercise per week - Incorporate strength training exercises to maintain muscle mass  Return if symptoms worsen or fail to improve.  Veronia Goon, DO 04/04/2024, 12:14 PM PGY-3,  Family Medicine

## 2024-04-13 ENCOUNTER — Encounter: Payer: Self-pay | Admitting: Student

## 2024-04-14 NOTE — Telephone Encounter (Signed)
 Patient returns call to nurse line regarding mychart message.   Patient reports that she was only taking Ibuprofen  after toe surgery. She has not been on medication in over one month.   She reports that Meloxicam  was previously prescribed by our office and that this works better for management of arthritic pain.   Will forward to provider for further advisement.   Elsie Halo, RN

## 2024-04-22 ENCOUNTER — Encounter: Payer: Self-pay | Admitting: Orthopedic Surgery

## 2024-04-25 DIAGNOSIS — Z419 Encounter for procedure for purposes other than remedying health state, unspecified: Secondary | ICD-10-CM | POA: Diagnosis not present

## 2024-04-26 ENCOUNTER — Ambulatory Visit: Admitting: Family Medicine

## 2024-04-26 ENCOUNTER — Ambulatory Visit
Admission: RE | Admit: 2024-04-26 | Discharge: 2024-04-26 | Disposition: A | Discharge status: Home / Self Care | Source: Ambulatory Visit | Attending: Physical Medicine and Rehabilitation | Admitting: Physical Medicine and Rehabilitation

## 2024-04-26 DIAGNOSIS — M5416 Radiculopathy, lumbar region: Secondary | ICD-10-CM

## 2024-04-26 DIAGNOSIS — M48061 Spinal stenosis, lumbar region without neurogenic claudication: Secondary | ICD-10-CM | POA: Diagnosis not present

## 2024-04-26 DIAGNOSIS — Z981 Arthrodesis status: Secondary | ICD-10-CM | POA: Diagnosis not present

## 2024-04-28 ENCOUNTER — Ambulatory Visit: Admitting: Student

## 2024-04-28 ENCOUNTER — Encounter: Payer: Self-pay | Admitting: Student

## 2024-04-28 VITALS — BP 107/66 | HR 86 | Ht 64.0 in | Wt 179.6 lb

## 2024-04-28 DIAGNOSIS — G8929 Other chronic pain: Secondary | ICD-10-CM

## 2024-04-28 DIAGNOSIS — M25571 Pain in right ankle and joints of right foot: Secondary | ICD-10-CM

## 2024-04-28 MED ORDER — ACETAMINOPHEN ER 650 MG PO TBCR: 650.0000 mg | EXTENDED_RELEASE_TABLET | Freq: Three times a day (TID) | ORAL | 0 refills | Status: AC | PRN

## 2024-04-28 MED ORDER — MELOXICAM 15 MG PO TABS: 15.0000 mg | ORAL_TABLET | Freq: Every day | ORAL | 0 refills | Status: DC

## 2024-04-28 NOTE — Patient Instructions (Signed)
 It was great to see you today! Thank you for choosing [facility] for your primary care.  Today we addressed: Please do not use Tylenol  more than 1000 mg as 4 times daily.  NSAID use should be followed based on bottle but should you use ibuprofen  please do not take more than 600 mg 3 times daily.  I prescribed you both Tylenol  and meloxicam .  Do not use multiple NSAIDs at a time.  I have also referred you to physical therapy.  You may continue lidocaine  patches.  If you haven't already, sign up for My Chart to have easy access to your labs results, and communication with your primary care physician.  Return if symptoms worsen or fail to improve. Please arrive 15 minutes before your appointment to ensure smooth check in process.  We appreciate your efforts in making this happen.  Thank you for allowing me to participate in your care, Veronia Goon, DO 04/28/2024, 4:22 PM PGY-3, [facility]

## 2024-04-28 NOTE — Progress Notes (Signed)
  SUBJECTIVE:   CHIEF COMPLAINT / HPI:   Sandra Lozano is a 63 year old female with arthritis who presents for medication refills and management of joint pain.  She experiences significant inflammation and pain in her right ankle, exacerbated by weather changes, particularly rain. Meloxicam  and Tylenol  effectively control her symptoms. She previously required multiple doses of ibuprofen  and Tylenol  due to persistent pain.  Arthritis affects her shoulders and hip, with chronic pain mainly on the right side. She received injections in her shoulder and ankle, which provided temporary relief.  Her current medication regimen includes meloxicam  in the morning with Tylenol , and an additional Tylenol  in the afternoon if needed. She previously used lidocaine  patches with good effect but has not used them recently due to the effectiveness of her current regimen. She reports no gastrointestinal side effects from meloxicam .  PERTINENT  PMH / PSH: HTN, prediabetes, COPD   OBJECTIVE:  BP 107/66   Pulse 86   Ht 5\' 4"  (1.626 m)   Wt 179 lb 9.6 oz (81.5 kg)   LMP  (LMP Unknown)   SpO2 99%   BMI 30.83 kg/m  General: Well-appearing, NAD  ASSESSMENT/PLAN:   Assessment & Plan Chronic pain of right ankle DJD/arthritis of the right ankle on previous imaging, pain control with Tylenol  650 mg 3 times daily as needed, meloxicam  15 mg daily as needed.  Patient amenable to physical therapy referral.  She has further management by orthopedic surgery with steroid injections.  She has been counseled and understands risk of continued use of meloxicam  with gastric ulcers.  Recent kidney function is normal.  Recommend monitoring annually.  She has also been counseled in long-term use of NSAIDs.  May continue lidocaine  patch use. Return if symptoms worsen or fail to improve. Veronia Goon, DO 04/28/2024, 4:22 PM PGY-3, [facility]

## 2024-04-28 NOTE — Assessment & Plan Note (Signed)
 DJD/arthritis of the right ankle on previous imaging, pain control with Tylenol  650 mg 3 times daily as needed, meloxicam  15 mg daily as needed.  Patient amenable to physical therapy referral.  She has further management by orthopedic surgery with steroid injections.  She has been counseled and understands risk of continued use of meloxicam  with gastric ulcers.  Recent kidney function is normal.  Recommend monitoring annually.  She has also been counseled in long-term use of NSAIDs.  May continue lidocaine  patch use.

## 2024-05-01 ENCOUNTER — Other Ambulatory Visit: Payer: Self-pay | Admitting: Family Medicine

## 2024-05-01 DIAGNOSIS — G5601 Carpal tunnel syndrome, right upper limb: Secondary | ICD-10-CM

## 2024-05-23 ENCOUNTER — Ambulatory Visit

## 2024-05-24 ENCOUNTER — Other Ambulatory Visit: Payer: Self-pay | Admitting: Student

## 2024-05-24 DIAGNOSIS — G8929 Other chronic pain: Secondary | ICD-10-CM

## 2024-05-25 ENCOUNTER — Ambulatory Visit: Attending: Family Medicine | Admitting: Physical Therapy

## 2024-05-25 ENCOUNTER — Other Ambulatory Visit: Payer: Self-pay

## 2024-05-25 ENCOUNTER — Encounter: Payer: Self-pay | Admitting: Physical Therapy

## 2024-05-25 DIAGNOSIS — M25571 Pain in right ankle and joints of right foot: Secondary | ICD-10-CM | POA: Diagnosis not present

## 2024-05-25 DIAGNOSIS — M25671 Stiffness of right ankle, not elsewhere classified: Secondary | ICD-10-CM | POA: Insufficient documentation

## 2024-05-25 DIAGNOSIS — R2689 Other abnormalities of gait and mobility: Secondary | ICD-10-CM | POA: Diagnosis not present

## 2024-05-25 NOTE — Therapy (Signed)
 OUTPATIENT PHYSICAL THERAPY LOWER EXTREMITY EVALUATION   Patient Name: Sandra Lozano MRN: [id number] DOB:09-Jul-1961, 63 y.o., female Today's Date: 05/25/2024  END OF SESSION:   Past Medical History:  Diagnosis Date   Arthritis    right knee, lower back   Chest wall pain 04/20/2021   Dyspnea    very rare -tx with albuterol  neb sol if needed   Hyperlipidemia    Hypertension    Intertrigo 08/06/2022   Pre-diabetes    per patient - does not check blood sugar   Right hip pain 05/05/2018   Rupture of anterior cruciate ligament of right knee 02/15/2018   Sprain of medial collateral ligament of right knee 02/15/2018   SVD (spontaneous vaginal delivery)    x 5 - only 2 living, 1 stillborn and 1 premature at 7 months demise,1 child deceased   Wears dentures    upper and lower   Past Surgical History:  Procedure Laterality Date   FOOT ARTHRODESIS Left 11/02/2020   Procedure: LEFT MIDFOOT FUSION BASE 1ST AND 2ND METATARSAL;  Surgeon: Timothy Ford, MD;  Location: [facility] OR;  Service: Orthopedics;  Laterality: Left;   LUMBAR FUSION  05/20/2019   GILL PROCEDURE, LEFT TRANSFORAMINAL LUMBAR INTERBODY FUSION, PEDICLE INSTRUMENTATION, BILATERAL FUSION (N/A   TOTAL KNEE ARTHROPLASTY Right 08/21/2023   Procedure: RIGHT TOTAL KNEE ARTHROPLASTY;  Surgeon: Timothy Ford, MD;  Location: [facility] OR;  Service: Orthopedics;  Laterality: Right;   TUBAL LIGATION     Patient Active Problem List   Diagnosis Date Noted   Chronic pain of right ankle 02/01/2024   Benign lichenoid keratosis 12/24/2023   Bruising 12/24/2023   Unilateral primary osteoarthritis, right knee 08/21/2023   Total knee replacement status, right 08/21/2023   Hidradenitis suppurativa of right axilla 06/03/2023   Vaginal irritation 02/21/2023   Tinea 12/18/2022   Left shoulder pain 08/25/2022   DJD (degenerative joint disease), multiple sites 08/06/2022   COPD, mild (HCC) 02/11/2022   Carpal tunnel syndrome, bilateral 12/21/2021    Arthritis of finger of left hand 11/26/2021   Traumatic arthritis of left foot    Lichen simplex chronicus 06/04/2020   Arthritis of midfoot 03/14/2020   History of lumbar fusion 03/14/2020   Well controlled diabetes mellitus (HCC) 11/07/2019   Nummular eczema 07/06/2019   Lumbar stenosis 05/20/2019   Acute left-sided low back pain without sciatica 07/18/2018   Osteoarthritis of right hip 06/03/2018   Chronic right shoulder pain 05/05/2018   Essential hypertension 02/15/2018    PCP: Santa Cuba, MD  REFERRING PROVIDER:   Kandis Ormond, DO    REFERRING DIAG: [phone] (ICD-10-CM) - Chronic pain of right ankle   Rationale for Evaluation and Treatment: Rehabilitation  THERAPY DIAG:  No diagnosis found.  PERTINENT HISTORY: HTN, COPD, R TKA  WEIGHT BEARING RESTRICTIONS: No  FALLS:  Has patient fallen in last 6 months? No  LIVING ENVIRONMENT: Lives with: lives with their family Lives in: House/apartment Stairs: Yes: Internal: 12 steps; on right going up Has following equipment at home: None  OCCUPATION: not currently working   PRECAUTIONS: None ---------------------------------------------------------------------------------------------  SUBJECTIVE:   SUBJECTIVE STATEMENT: Eval statement 05/25/2024: has been experiencing R ankle pain onset a year, had a cortisone shot that didn't help. Only has relief from lidocaine  patch, works out every morning. Exercise does ease it a bit, but has to wear a compression sock otherwise her ankle will bother her.  Currently 8/10,  had a R TKA in 2024  RED FLAGS: None  PLOF: Independent  PATIENT GOALS: learn how to walk  NEXT MD VISIT: TBD ---------------------------------------------------------------------------------------------  OBJECTIVE:  Note: Objective measures were completed at Evaluation unless otherwise noted.  DIAGNOSTIC FINDINGS: IMPRESSION: 1. Mild edema within the flexor digitorum brevis muscle  just deep to the medial band of the plantar fascia approximately 2.4 cm distal from the plantar fascia origin. Otherwise, the plantar fascia origins are intact. There is a mild-to-moderate plantar calcaneal heel spur. 2. Mild-to-moderate chronic cystic change with mild surrounding marrow edema within the talus at the deltoid ligament insertion. This appears to be the sequela of chronic traction changes at the deltoid ligament insertion. 3. 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 4. Mild tibiotalar and moderate posterior aspect of the posterior subtalar joint cartilage thinning.     Electronically Signed   By: Bertina Broccoli M.D.   On: 12/31/2023 12:08  PATIENT SURVEYS:  LEFS  Extreme difficulty/unable (0), Quite a bit of difficulty (1), Moderate difficulty (2), Little difficulty (3), No difficulty (4) Survey date:    Any of your usual work, housework or school activities   2. Usual hobbies, recreational or sporting activities   3. Getting into/out of the bath   4. Walking between rooms   5. Putting on socks/shoes   6. Squatting    7. Lifting an object, like a bag of groceries from the floor   8. Performing light activities around your home   9. Performing heavy activities around your home   10. Getting into/out of a car   11. Walking 2 blocks   12. Walking 1 mile   13. Going up/down 10 stairs (1 flight)   14. Standing for 1 hour   15.  sitting for 1 hour   16. Running on even ground   17. Running on uneven ground   18. Making sharp turns while running fast   19. Hopping    20. Rolling over in bed   Score total:  35/80     COGNITION: Overall cognitive status: Within functional limits for tasks assessed     SENSATION: WFL  EDEMA:  Trace swelling around Lateral L malleolus  MUSCLE LENGTH: Gastroc limited  POSTURE: No Significant postural limitations  PALPATION: Tenderness to superior lateral malleolus and lateral calcaneus tubercle  LOWER EXTREMITY  ROM:  Active ROM Right eval Left eval  Hip flexion    Hip extension    Hip abduction    Hip adduction    Hip internal rotation    Hip external rotation    Knee flexion    Knee extension    Ankle dorsiflexion    Ankle plantarflexion    Ankle inversion    Ankle eversion     (Blank rows = not tested)  ! Indicates pain with testing  LOWER EXTREMITY MMT:  MMT Right eval Left eval  Hip flexion    Hip extension    Hip abduction    Hip adduction    Hip internal rotation    Hip external rotation    Knee flexion    Knee extension    Ankle dorsiflexion    Ankle plantarflexion    Ankle inversion    Ankle eversion     (Blank rows = not tested)  ! Indicates pain with testing  LOWER EXTREMITY SPECIAL TESTS:  Ankle special tests: Anterior drawer test: negative, Great toe extension test: negative, and Dorsiflexion-Eversion test: positive   FUNCTIONAL TESTS:  Timed up and go (TUG): 15s  GAIT: Distance walked: 15ft Assistive device utilized: None Level  of assistance: SBA Comments: unsteady gait, antalgic's RLE gait, R weight shift                                                                                                                                [facility] Adult PT Treatment:                                                DATE: 05/25/2024   Self Care: Pt education, detailed below POC discussion    PATIENT EDUCATION:  Education details: Pt received education regarding HEP performance, ADL performance, functional activity tolerance, impairment education, appropriate performance of therapeutic activities. Person educated: Patient Education method: Explanation, Demonstration, Tactile cues, Verbal cues, and Handouts Education comprehension: verbalized understanding and returned demonstration  HOME EXERCISE PROGRAM: Access Code: [email or web address] URL: [email or web address] Date: 05/25/2024 Prepared by: Albesa Huguenin  Exercises - Supine Ankle Circles  - 1 x  daily - 7 x weekly - 2 sets - 1 reps - 32m hold - Seated Toe Raise  - 1 x daily - 5-7 x weekly - 3 sets - 10 reps - 2 hold - Seated Heel Raise  - 1 x daily - 5-7 x weekly - 3 sets - 10 reps - 2s hold - Ankle Inversion Eversion Towel Slide  - 1 x daily - 5-7 x weekly - 2-3 sets - 10 reps - 2s hold ---------------------------------------------------------------------------------------------  ASSESSMENT:  CLINICAL IMPRESSION: Eval impression (05/25/2024): Pt. attended today's physical therapy session for evaluation of R ankle pain. Pt has complaints of posterior lateral ankle pain that gets up to 8/10 onset pat year. 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 Pt would benefit from therapeutic focus on R ankle stability, strengthening, and ambulation quality.  Treatment performed today focused on pt education detailed in objective. Pt demonstrated good understanding of education provided. required minimal verbal/tactile cues and stand by assistance for appropriate performance with today's activities. Pt requires the intervention of skilled outpatient physical therapy to address the aforementioned deficits and progress towards a functional level in line with therapeutic goals.   OBJECTIVE IMPAIRMENTS: Abnormal gait, decreased activity tolerance, decreased balance, decreased knowledge of condition, decreased mobility, difficulty walking, decreased ROM, decreased strength, increased edema, improper body mechanics, and pain.   ACTIVITY LIMITATIONS: standing, squatting, stairs, and locomotion level  PARTICIPATION LIMITATIONS: interpersonal relationship, driving, shopping, and community activity  PERSONAL FACTORS: Fitness, Past/current experiences, Time since onset of injury/illness/exacerbation, and 1 comorbidity: R TKA within past year are also affecting patient's functional outcome.   REHAB  POTENTIAL: Good  CLINICAL DECISION MAKING: Stable/uncomplicated  EVALUATION COMPLEXITY: Low   GOALS: Goals reviewed with patient? YES  SHORT TERM GOALS: Target date: 06/15/2024 Pt will be independent with administered HEP to demonstrate  the competency necessary for long term managemnet of symptoms at home.  Baseline: Goal status: INITIAL   LONG TERM GOALS: Target date: 07/06/2024  Pt. Will achieve a LEFS score of 47/80 as to demonstrate improvement in self-perceived functional ability with daily activities.  Baseline:  Goal status: INITIAL  2.  Pt will improve Global ankle strength to a 4+/5 to demonstrate improvement in strength for quality of motion and activity performance.  Baseline:  Goal status: INITIAL  3.  Pt will report the ability to stand for >/= 30 minutes as to demonstrate improved tolerance to standing for prolonged time and improved ability to participate in ADLs.  Baseline: 5 minutes Goal status: INITIAL  4.   Pt will independently ambulate 1038ft with LRAD and less than 2/10 pain to demonstrate improved weightbearing tolerance, BLE strength, and functional capacity for community ambulation.  Baseline: 147ft 8/10 Goal status: INITIAL --------------------------------------------------------------------------------------------- PLAN:  PT FREQUENCY: 1-2x/week  PT DURATION: 6 weeks  PLANNED INTERVENTIONS: 97110-Therapeutic exercises, 97530- Therapeutic activity, 97112- Neuromuscular re-education, 97535- Self Care, 62130- Manual therapy, [phone]- Gait training, [id number]- Electrical stimulation (manual), Patient/Family education, Balance training, Stair training, Taping, Joint mobilization, and DME instructions  PLAN FOR NEXT SESSION: Review HEP, Begin POC as detailed in the assessment   Albesa Huguenin, PT, DPT 05/25/2024, 8:18 AM  For all possible CPT codes, reference the Planned Interventions line above.     Check all conditions that are expected to impact  treatment: {Conditions expected to impact treatment:None of these apply   If treatment provided at initial evaluation, no treatment charged due to lack of authorization.

## 2024-05-26 DIAGNOSIS — Z419 Encounter for procedure for purposes other than remedying health state, unspecified: Secondary | ICD-10-CM | POA: Diagnosis not present

## 2024-06-03 ENCOUNTER — Ambulatory Visit (INDEPENDENT_AMBULATORY_CARE_PROVIDER_SITE_OTHER)

## 2024-06-03 VITALS — BP 148/108 | HR 67 | Ht 64.0 in | Wt 193.4 lb

## 2024-06-03 DIAGNOSIS — M549 Dorsalgia, unspecified: Secondary | ICD-10-CM | POA: Diagnosis not present

## 2024-06-03 DIAGNOSIS — G8929 Other chronic pain: Secondary | ICD-10-CM

## 2024-06-03 DIAGNOSIS — G5603 Carpal tunnel syndrome, bilateral upper limbs: Secondary | ICD-10-CM

## 2024-06-03 DIAGNOSIS — M25571 Pain in right ankle and joints of right foot: Secondary | ICD-10-CM

## 2024-06-03 MED ORDER — MELOXICAM 15 MG PO TABS: 15.0000 mg | ORAL_TABLET | Freq: Every day | ORAL | 0 refills | Status: DC | PRN

## 2024-06-03 NOTE — Progress Notes (Unsigned)
    SUBJECTIVE:   CHIEF COMPLAINT / HPI:   Sandra Lozano is a 63yo F w/ hx of BL carpal tunnel pain that pf carpal tunnel f/u.  Carpal Tunnel Syndrome - Has been seen previously for same issue 07/09/23 w/ Dr. Howell, was recommended ortho f/u at that time.  - Pt has not been able to f/u with ortho yet about this. - She previously had steroid injections that helped, but was unsure where she got them done. She is interested in pursuing this.   L back pain - She also reports recently having an occasional L sided mid back discomfort. She has been working out at [employer] and thinks she may have strained her back. The pain does not bother her currently, it will occasionally feel uncomfortable.   OBJECTIVE:   BP (!) 148/108   Pulse 67   Ht 5' 4 (1.626 m)   Wt 193 lb 6.4 oz (87.7 kg)   LMP  (LMP Unknown)   SpO2 99%   BMI 33.20 kg/m   General: Alert, pleasant woman. NAD. HEENT: NCAT. MMM. CV: RRR, no murmurs. Resp: CTAB, no wheezing or crackles. Normal WOB on RA.  Ext: Moves all ext spontaneously Skin: Warm, well perfused   ASSESSMENT/PLAN:   Assessment & Plan Carpal tunnel syndrome, bilateral Acute on chronic. Has benefitted from steroid injection in past, and interested in again.  - Referral to Sports Med for potential injection. If this is not helpful, consider hand surgery referral Acute left-sided back pain, unspecified back location Acute and now resolving, w/ very minimal symptoms. No red flag signs such as weakness or incontinence. Provided reassurance and recommended lighter loads w/ exercise.   Sandra Nearing, MD [facility]

## 2024-06-03 NOTE — Patient Instructions (Signed)
 Good to see you today - Thank you for coming in  Things we discussed today:  1) For your carpal tunnel, - I am sending a referral for you to see Sports Medicine to get a steroid injection of your wrists. This should be more helpful for your situation - It is okay to take the meloxicam  as needed for the pain, but you should try to avoid taking it for too many days in a row for too long.  Using this medication for longer than 1 month at a time can lead to stress on your kidneys and your stomach lining. - You can take Tylenol  as needed to help with the pain as well.  Tylenol  is safer than meloxicam . - You can continue taking gabapentin .

## 2024-06-03 NOTE — Progress Notes (Unsigned)
    SUBJECTIVE:   CHIEF COMPLAINT / HPI:   ***   Reports having a transient discomfort on the L side    Carpal Tunnel - Is very uncontrolled, has very bad neuropathy - Has tried exercises, meloxicam , steroid injection. She reports benefit from steroid injection in the past.  -     PERTINENT  PMH / PSH: ***  OBJECTIVE:   BP (!) 148/108   Pulse 67   Ht 5' 4 (1.626 m)   Wt 193 lb 6.4 oz (87.7 kg)   LMP  (LMP Unknown)   SpO2 99%   BMI 33.20 kg/m   ***  ASSESSMENT/PLAN:   Assessment & Plan      Albin Huh, MD [facility]

## 2024-06-05 ENCOUNTER — Other Ambulatory Visit: Payer: Self-pay | Admitting: Student

## 2024-06-05 DIAGNOSIS — G5601 Carpal tunnel syndrome, right upper limb: Secondary | ICD-10-CM

## 2024-06-05 NOTE — Assessment & Plan Note (Signed)
 Acute on chronic. Has benefitted from steroid injection in past, and interested in again.  - Referral to Sports Med for potential injection. If this is not helpful, consider hand surgery referral

## 2024-06-06 ENCOUNTER — Encounter: Admitting: Physical Therapy

## 2024-06-08 ENCOUNTER — Encounter

## 2024-06-08 NOTE — Therapy (Deleted)
 OUTPATIENT PHYSICAL THERAPY LOWER EXTREMITY EVALUATION   Patient Name: Sandra Lozano MRN: [id number] DOB:July 21, 1961, 63 y.o., female Today's Date: 06/08/2024  END OF SESSION:   Past Medical History:  Diagnosis Date   Arthritis    right knee, lower back   Chest wall pain 04/20/2021   Dyspnea    very rare -tx with albuterol  neb sol if needed   Hyperlipidemia    Hypertension    Intertrigo 08/06/2022   Pre-diabetes    per patient - does not check blood sugar   Right hip pain 05/05/2018   Rupture of anterior cruciate ligament of right knee 02/15/2018   Sprain of medial collateral ligament of right knee 02/15/2018   SVD (spontaneous vaginal delivery)    x 5 - only 2 living, 1 stillborn and 1 premature at 7 months demise,1 child deceased   Wears dentures    upper and lower   Past Surgical History:  Procedure Laterality Date   FOOT ARTHRODESIS Left 11/02/2020   Procedure: LEFT MIDFOOT FUSION BASE 1ST AND 2ND METATARSAL;  Surgeon: Harden Jerona GAILS, MD;  Location: [facility] OR;  Service: Orthopedics;  Laterality: Left;   LUMBAR FUSION  05/20/2019   GILL PROCEDURE, LEFT TRANSFORAMINAL LUMBAR INTERBODY FUSION, PEDICLE INSTRUMENTATION, BILATERAL FUSION (N/A   TOTAL KNEE ARTHROPLASTY Right 08/21/2023   Procedure: RIGHT TOTAL KNEE ARTHROPLASTY;  Surgeon: Harden Jerona GAILS, MD;  Location: [facility] OR;  Service: Orthopedics;  Laterality: Right;   TUBAL LIGATION     Patient Active Problem List   Diagnosis Date Noted   Chronic pain of right ankle 02/01/2024   Benign lichenoid keratosis 12/24/2023   Bruising 12/24/2023   Unilateral primary osteoarthritis, right knee 08/21/2023   Total knee replacement status, right 08/21/2023   Hidradenitis suppurativa of right axilla 06/03/2023   Vaginal irritation 02/21/2023   Tinea 12/18/2022   Left shoulder pain 08/25/2022   DJD (degenerative joint disease), multiple sites 08/06/2022   COPD, mild (HCC) 02/11/2022   Carpal tunnel syndrome, bilateral 12/21/2021    Arthritis of finger of left hand 11/26/2021   Traumatic arthritis of left foot    Lichen simplex chronicus 06/04/2020   Arthritis of midfoot 03/14/2020   History of lumbar fusion 03/14/2020   Well controlled diabetes mellitus (HCC) 11/07/2019   Nummular eczema 07/06/2019   Lumbar stenosis 05/20/2019   Acute left-sided low back pain without sciatica 07/18/2018   Osteoarthritis of right hip 06/03/2018   Chronic right shoulder pain 05/05/2018   Essential hypertension 02/15/2018    PCP: Nicholas Bar, MD  REFERRING PROVIDER:   Madelon Donald CHRISTELLA, DO    REFERRING DIAG: [phone] (ICD-10-CM) - Chronic pain of right ankle   Rationale for Evaluation and Treatment: Rehabilitation  THERAPY DIAG:  No diagnosis found.  PERTINENT HISTORY: HTN, COPD, R TKA  WEIGHT BEARING RESTRICTIONS: No  FALLS:  Has patient fallen in last 6 months? No  LIVING ENVIRONMENT: Lives with: lives with their family Lives in: House/apartment Stairs: Yes: Internal: 12 steps; on right going up Has following equipment at home: None  OCCUPATION: not currently working   PRECAUTIONS: None ---------------------------------------------------------------------------------------------  SUBJECTIVE:   SUBJECTIVE STATEMENT: Eval statement 05/25/2024: has been experiencing R ankle pain onset a year, had a cortisone shot that didn't help. Only has relief from lidocaine  patch, works out every morning. Exercise does ease it a bit, but has to wear a compression sock otherwise her ankle will bother her.  Currently 8/10,  had a R TKA in 2024  RED FLAGS: None  PLOF: Independent  PATIENT GOALS: learn how to walk  NEXT MD VISIT: TBD ---------------------------------------------------------------------------------------------  OBJECTIVE:  Note: Objective measures were completed at Evaluation unless otherwise noted.  DIAGNOSTIC FINDINGS: IMPRESSION: 1. Mild edema within the flexor digitorum brevis muscle  just deep to the medial band of the plantar fascia approximately 2.4 cm distal from the plantar fascia origin. Otherwise, the plantar fascia origins are intact. There is a mild-to-moderate plantar calcaneal heel spur. 2. Mild-to-moderate chronic cystic change with mild surrounding marrow edema within the talus at the deltoid ligament insertion. This appears to be the sequela of chronic traction changes at the deltoid ligament insertion. 3. 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 4. Mild tibiotalar and moderate posterior aspect of the posterior subtalar joint cartilage thinning.     Electronically Signed   By: Tanda Lyons M.D.   On: 12/31/2023 12:08  PATIENT SURVEYS:  LEFS  Extreme difficulty/unable (0), Quite a bit of difficulty (1), Moderate difficulty (2), Little difficulty (3), No difficulty (4) Survey date:    Any of your usual work, housework or school activities   2. Usual hobbies, recreational or sporting activities   3. Getting into/out of the bath   4. Walking between rooms   5. Putting on socks/shoes   6. Squatting    7. Lifting an object, like a bag of groceries from the floor   8. Performing light activities around your home   9. Performing heavy activities around your home   10. Getting into/out of a car   11. Walking 2 blocks   12. Walking 1 mile   13. Going up/down 10 stairs (1 flight)   14. Standing for 1 hour   15.  sitting for 1 hour   16. Running on even ground   17. Running on uneven ground   18. Making sharp turns while running fast   19. Hopping    20. Rolling over in bed   Score total:  35/80     COGNITION: Overall cognitive status: Within functional limits for tasks assessed     SENSATION: WFL  EDEMA:  Trace swelling around Lateral L malleolus  MUSCLE LENGTH: Gastroc limited  POSTURE: No Significant postural limitations  PALPATION: Tenderness to superior lateral malleolus and lateral calcaneus tubercle  LOWER EXTREMITY  ROM:  Active ROM Right eval Left eval  Hip flexion    Hip extension    Hip abduction    Hip adduction    Hip internal rotation    Hip external rotation    Knee flexion    Knee extension    Ankle dorsiflexion    Ankle plantarflexion    Ankle inversion    Ankle eversion     (Blank rows = not tested)  ! Indicates pain with testing  LOWER EXTREMITY MMT:  MMT Right eval Left eval  Hip flexion    Hip extension    Hip abduction    Hip adduction    Hip internal rotation    Hip external rotation    Knee flexion    Knee extension    Ankle dorsiflexion    Ankle plantarflexion    Ankle inversion    Ankle eversion     (Blank rows = not tested)  ! Indicates pain with testing  LOWER EXTREMITY SPECIAL TESTS:  Ankle special tests: Anterior drawer test: negative, Great toe extension test: negative, and Dorsiflexion-Eversion test: positive   FUNCTIONAL TESTS:  Timed up and go (TUG): 15s  GAIT: Distance walked: 163ft Assistive device utilized: None Level  of assistance: SBA Comments: unsteady gait, antalgic's RLE gait, R weight shift                                                                                                                                [facility] Adult PT Treatment:                                                DATE: 06/08/2024   Self Care: Pt education, detailed below POC discussion    PATIENT EDUCATION:  Education details: Pt received education regarding HEP performance, ADL performance, functional activity tolerance, impairment education, appropriate performance of therapeutic activities. Person educated: Patient Education method: Explanation, Demonstration, Tactile cues, Verbal cues, and Handouts Education comprehension: verbalized understanding and returned demonstration  HOME EXERCISE PROGRAM: Access Code: [email or web address] URL: [email or web address] Date: 05/25/2024 Prepared by: Mabel Kiang  Exercises - Supine Ankle Circles  - 1 x  daily - 7 x weekly - 2 sets - 1 reps - 62m hold - Seated Toe Raise  - 1 x daily - 5-7 x weekly - 3 sets - 10 reps - 2 hold - Seated Heel Raise  - 1 x daily - 5-7 x weekly - 3 sets - 10 reps - 2s hold - Ankle Inversion Eversion Towel Slide  - 1 x daily - 5-7 x weekly - 2-3 sets - 10 reps - 2s hold ---------------------------------------------------------------------------------------------  ASSESSMENT:  CLINICAL IMPRESSION: Eval impression (05/25/2024): Pt. attended today's physical therapy session for evaluation of R ankle pain. Pt has complaints of posterior lateral ankle pain that gets up to 8/10 onset pat year. 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 Pt would benefit from therapeutic focus on R ankle stability, strengthening, and ambulation quality.  Treatment performed today focused on pt education detailed in objective. Pt demonstrated good understanding of education provided. required minimal verbal/tactile cues and stand by assistance for appropriate performance with today's activities. Pt requires the intervention of skilled outpatient physical therapy to address the aforementioned deficits and progress towards a functional level in line with therapeutic goals.   OBJECTIVE IMPAIRMENTS: Abnormal gait, decreased activity tolerance, decreased balance, decreased knowledge of condition, decreased mobility, difficulty walking, decreased ROM, decreased strength, increased edema, improper body mechanics, and pain.   ACTIVITY LIMITATIONS: standing, squatting, stairs, and locomotion level  PARTICIPATION LIMITATIONS: interpersonal relationship, driving, shopping, and community activity  PERSONAL FACTORS: Fitness, Past/current experiences, Time since onset of injury/illness/exacerbation, and 1 comorbidity: R TKA within past year are also affecting patient's functional outcome.   REHAB  POTENTIAL: Good  CLINICAL DECISION MAKING: Stable/uncomplicated  EVALUATION COMPLEXITY: Low   GOALS: Goals reviewed with patient? YES  SHORT TERM GOALS: Target date: 06/15/2024 Pt will be independent with administered HEP to demonstrate  the competency necessary for long term managemnet of symptoms at home.  Baseline: Goal status: INITIAL   LONG TERM GOALS: Target date: 07/06/2024  Pt. Will achieve a LEFS score of 47/80 as to demonstrate improvement in self-perceived functional ability with daily activities.  Baseline:  Goal status: INITIAL  2.  Pt will improve Global ankle strength to a 4+/5 to demonstrate improvement in strength for quality of motion and activity performance.  Baseline:  Goal status: INITIAL  3.  Pt will report the ability to stand for >/= 30 minutes as to demonstrate improved tolerance to standing for prolonged time and improved ability to participate in ADLs.  Baseline: 5 minutes Goal status: INITIAL  4.   Pt will independently ambulate 1023ft with LRAD and less than 2/10 pain to demonstrate improved weightbearing tolerance, BLE strength, and functional capacity for community ambulation.  Baseline: 134ft 8/10 Goal status: INITIAL --------------------------------------------------------------------------------------------- PLAN:  PT FREQUENCY: 1-2x/week  PT DURATION: 6 weeks  PLANNED INTERVENTIONS: 97110-Therapeutic exercises, 97530- Therapeutic activity, 97112- Neuromuscular re-education, 97535- Self Care, [id number]- Manual therapy, [phone]- Gait training, [phone]- Electrical stimulation (manual), Patient/Family education, Balance training, Stair training, Taping, Joint mobilization, and DME instructions  PLAN FOR NEXT SESSION: Review HEP, Begin POC as detailed in the assessment   Mabel Kiang, PT, DPT 06/08/2024, 6:09 PM  For all possible CPT codes, reference the Planned Interventions line above.     Check all conditions that are expected to impact  treatment: {Conditions expected to impact treatment:None of these apply   If treatment provided at initial evaluation, no treatment charged due to lack of authorization.

## 2024-06-09 ENCOUNTER — Ambulatory Visit: Admitting: Physical Therapy

## 2024-06-09 ENCOUNTER — Encounter: Payer: Self-pay | Admitting: Physical Therapy

## 2024-06-09 DIAGNOSIS — R2689 Other abnormalities of gait and mobility: Secondary | ICD-10-CM

## 2024-06-09 DIAGNOSIS — M25571 Pain in right ankle and joints of right foot: Secondary | ICD-10-CM

## 2024-06-09 DIAGNOSIS — M25671 Stiffness of right ankle, not elsewhere classified: Secondary | ICD-10-CM | POA: Diagnosis not present

## 2024-06-09 NOTE — Therapy (Signed)
 OUTPATIENT PHYSICAL THERAPY LOWER TREATMENT  Patient Name: Sandra Lozano MRN: [id number] DOB:[birth year], 63 y.o., female Today's Date: 06/10/2024  END OF SESSION:  PT End of Session - 06/10/24 0832     Visit Number 3    Number of Visits 13    Date for PT Re-Evaluation 07/06/24    Authorization Type MCD    Authorization Time Period Approved 8 visits starting 05/25/24-07/24/24    Authorization - Visit Number 3    Authorization - Number of Visits 8    PT Start Time 0831    PT Stop Time 0910    PT Time Calculation (min) 39 min    Activity Tolerance Patient tolerated treatment well    Behavior During Therapy [facility] for tasks assessed/performed           Past Medical History:  Diagnosis Date   Arthritis    right knee, lower back   Chest wall pain 04/20/2021   Dyspnea    very rare -tx with albuterol  neb sol if needed   Hyperlipidemia    Hypertension    Intertrigo 08/06/2022   Pre-diabetes    per patient - does not check blood sugar   Right hip pain 05/05/2018   Rupture of anterior cruciate ligament of right knee 02/15/2018   Sprain of medial collateral ligament of right knee 02/15/2018   SVD (spontaneous vaginal delivery)    x 5 - only 2 living, 1 stillborn and 1 premature at 7 months demise,1 child deceased   Wears dentures    upper and lower   Past Surgical History:  Procedure Laterality Date   FOOT ARTHRODESIS Left 11/02/2020   Procedure: LEFT MIDFOOT FUSION BASE 1ST AND 2ND METATARSAL;  Surgeon: Harden Jerona GAILS, MD;  Location: [facility] OR;  Service: Orthopedics;  Laterality: Left;   LUMBAR FUSION  05/20/2019   GILL PROCEDURE, LEFT TRANSFORAMINAL LUMBAR INTERBODY FUSION, PEDICLE INSTRUMENTATION, BILATERAL FUSION (N/A   TOTAL KNEE ARTHROPLASTY Right 08/21/2023   Procedure: RIGHT TOTAL KNEE ARTHROPLASTY;  Surgeon: Harden Jerona GAILS, MD;  Location: [facility] OR;  Service: Orthopedics;  Laterality: Right;   TUBAL LIGATION     Patient Active Problem List   Diagnosis Date Noted   Chronic  pain of right ankle 02/01/2024   Benign lichenoid keratosis 12/24/2023   Bruising 12/24/2023   Unilateral primary osteoarthritis, right knee 08/21/2023   Total knee replacement status, right 08/21/2023   Hidradenitis suppurativa of right axilla 06/03/2023   Vaginal irritation 02/21/2023   Tinea 12/18/2022   Left shoulder pain 08/25/2022   DJD (degenerative joint disease), multiple sites 08/06/2022   COPD, mild (HCC) 02/11/2022   Carpal tunnel syndrome, bilateral 12/21/2021   Arthritis of finger of left hand 11/26/2021   Traumatic arthritis of left foot    Lichen simplex chronicus 06/04/2020   Arthritis of midfoot 03/14/2020   History of lumbar fusion 03/14/2020   Well controlled diabetes mellitus (HCC) 11/07/2019   Nummular eczema 07/06/2019   Lumbar stenosis 05/20/2019   Acute left-sided low back pain without sciatica 07/18/2018   Osteoarthritis of right hip 06/03/2018   Chronic right shoulder pain 05/05/2018   Essential hypertension 02/15/2018    PCP: Nicholas Bar, MD  REFERRING PROVIDER:   Madelon Donald CHRISTELLA, DO    REFERRING DIAG: [phone] (ICD-10-CM) - Chronic pain of right ankle   Rationale for Evaluation and Treatment: Rehabilitation  THERAPY DIAG:  Pain in right ankle and joints of right foot  Stiffness of right ankle, not elsewhere classified  Other abnormalities  of gait and mobility  PERTINENT HISTORY: HTN, COPD, R TKA  WEIGHT BEARING RESTRICTIONS: No  FALLS:  Has patient fallen in last 6 months? No  LIVING ENVIRONMENT: Lives with: lives with their family Lives in: House/apartment Stairs: Yes: Internal: 12 steps; on right going up Has following equipment at home: None  OCCUPATION: not currently working   PRECAUTIONS: None ---------------------------------------------------------------------------------------------  SUBJECTIVE:   SUBJECTIVE STATEMENT: Arrives with ASO brace and has been wearing for 6 months. Does not feel stable w/o the  brace.  Uses menthol  patch for symptom relief.  Discomfort 7/10 today.   Eval statement 05/25/2024: has been experiencing R ankle pain onset a year, had a cortisone shot that didn't help. Only has relief from lidocaine  patch, works out every morning. Exercise does ease it a bit, but has to wear a compression sock otherwise her ankle will bother her.  Currently 8/10,  had a R TKA in 2024  RED FLAGS: None   PLOF: Independent  PATIENT GOALS: learn how to walk  NEXT MD VISIT: TBD ---------------------------------------------------------------------------------------------  OBJECTIVE:  Note: Objective measures were completed at Evaluation unless otherwise noted.  DIAGNOSTIC FINDINGS: IMPRESSION: 1. Mild edema within the flexor digitorum brevis muscle just deep to the medial band of the plantar fascia approximately 2.4 cm distal from the plantar fascia origin. Otherwise, the plantar fascia origins are intact. There is a mild-to-moderate plantar calcaneal heel spur. 2. Mild-to-moderate chronic cystic change with mild surrounding marrow edema within the talus at the deltoid ligament insertion. This appears to be the sequela of chronic traction changes at the deltoid ligament insertion. 3. 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 4. Mild tibiotalar and moderate posterior aspect of the posterior subtalar joint cartilage thinning.     Electronically Signed   By: Tanda Lyons M.D.   On: 12/31/2023 12:08  PATIENT SURVEYS:  LEFS  Extreme difficulty/unable (0), Quite a bit of difficulty (1), Moderate difficulty (2), Little difficulty (3), No difficulty (4) Survey date:    Any of your usual work, housework or school activities   2. Usual hobbies, recreational or sporting activities   3. Getting into/out of the bath   4. Walking between rooms   5. Putting on socks/shoes   6. Squatting    7. Lifting an object, like a bag of groceries from the floor   8. Performing light activities  around your home   9. Performing heavy activities around your home   10. Getting into/out of a car   11. Walking 2 blocks   12. Walking 1 mile   13. Going up/down 10 stairs (1 flight)   14. Standing for 1 hour   15.  sitting for 1 hour   16. Running on even ground   17. Running on uneven ground   18. Making sharp turns while running fast   19. Hopping    20. Rolling over in bed   Score total:  35/80     COGNITION: Overall cognitive status: Within functional limits for tasks assessed     SENSATION: WFL  EDEMA:  Trace swelling around Lateral L malleolus  MUSCLE LENGTH: Gastroc limited  POSTURE: No Significant postural limitations  PALPATION: Tenderness to superior lateral malleolus and lateral calcaneus tubercle  LOWER EXTREMITY ROM:  Active ROM Right eval Left eval  Hip flexion    Hip extension    Hip abduction    Hip adduction    Hip internal rotation    Hip external rotation    Knee flexion  Knee extension    Ankle dorsiflexion    Ankle plantarflexion    Ankle inversion    Ankle eversion     (Blank rows = not tested)  ! Indicates pain with testing  LOWER EXTREMITY MMT:  MMT Right eval Left eval  Hip flexion    Hip extension    Hip abduction    Hip adduction    Hip internal rotation    Hip external rotation    Knee flexion    Knee extension    Ankle dorsiflexion    Ankle plantarflexion    Ankle inversion    Ankle eversion     (Blank rows = not tested)  ! Indicates pain with testing  LOWER EXTREMITY SPECIAL TESTS:  Ankle special tests: Anterior drawer test: negative, Great toe extension test: negative, and Dorsiflexion-Eversion test: positive   FUNCTIONAL TESTS:  Timed up and go (TUG): 15s  GAIT: Distance walked: 156ft Assistive device utilized: None Level of assistance: SBA Comments: unsteady gait, antalgic's RLE gait, R weight shift                                                                                                                                 TREATMENT: [facility] Adult PT Treatment:                                                DATE: 06/10/24 Therapeutic Exercise: Nustep L3 8 min Neuromuscular re-ed: PF against wall 15x R SLS rearfoot on Airex with UE support, 30/60s  Therapeutic Activity: PF stretch with towel 60s x2 4 way ankle YTB 15x2 ea.  [facility] Adult PT Treatment:                                                DATE: 06/09/2024 Therapeutic Activity: NuStep 8' for activity tolerance Ankle 4 way 1x15, hold 2s B heel raise 2x12, UE a. PRN RLE SLS on compliant surface 2x1' Marching on compliant surface 2x12, hold 3s     PATIENT EDUCATION:  Education details: Pt received education regarding HEP performance, ADL performance, functional activity tolerance, impairment education, appropriate performance of therapeutic activities. Person educated: Patient Education method: Explanation, Demonstration, Tactile cues, Verbal cues, and Handouts Education comprehension: verbalized understanding and returned demonstration  HOME EXERCISE PROGRAM: Access Code: [email or web address] URL: [email or web address] Date: 05/25/2024 Prepared by: Mabel Kiang  Exercises - Supine Ankle Circles  - 1 x daily - 7 x weekly - 2 sets - 1 reps - 49m hold - Seated Toe Raise  - 1 x daily - 5-7 x weekly - 3 sets - 10 reps - 2 hold - Seated Heel Raise  - 1 x daily -  5-7 x weekly - 3 sets - 10 reps - 2s hold - Ankle Inversion Eversion Towel Slide  - 1 x daily - 5-7 x weekly - 2-3 sets - 10 reps - 2s hold ---------------------------------------------------------------------------------------------  ASSESSMENT:  CLINICAL IMPRESSION: Continued symptoms and reported instability.  She is unable to mobilize outside of her brace.  Focus of today was ankle strength, proprioception and balance tasks.  Challenge reported with rearfoot balance tasks.  Challenge patient accordingly based on symptoms.   Eval impression  (05/25/2024): Pt. attended today's physical therapy session for evaluation of R ankle pain. Pt has complaints of posterior lateral ankle pain that gets up to 8/10 onset pat year. 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 Pt would benefit from therapeutic focus on R ankle stability, strengthening, and ambulation quality.  Treatment performed today focused on pt education detailed in objective. Pt demonstrated good understanding of education provided. required minimal verbal/tactile cues and stand by assistance for appropriate performance with today's activities. Pt requires the intervention of skilled outpatient physical therapy to address the aforementioned deficits and progress towards a functional level in line with therapeutic goals.   OBJECTIVE IMPAIRMENTS: Abnormal gait, decreased activity tolerance, decreased balance, decreased knowledge of condition, decreased mobility, difficulty walking, decreased ROM, decreased strength, increased edema, improper body mechanics, and pain.   ACTIVITY LIMITATIONS: standing, squatting, stairs, and locomotion level  PARTICIPATION LIMITATIONS: interpersonal relationship, driving, shopping, and community activity  PERSONAL FACTORS: Fitness, Past/current experiences, Time since onset of injury/illness/exacerbation, and 1 comorbidity: R TKA within past year are also affecting patient's functional outcome.   REHAB POTENTIAL: Good  CLINICAL DECISION MAKING: Stable/uncomplicated  EVALUATION COMPLEXITY: Low   GOALS: Goals reviewed with patient? YES  SHORT TERM GOALS: Target date: 06/15/2024 Pt will be independent with administered HEP to demonstrate the competency necessary for long term managemnet of symptoms at home.  Baseline: Goal status: INITIAL   LONG TERM GOALS: Target date: 07/06/2024  Pt. Will achieve a LEFS score of 47/80 as to  demonstrate improvement in self-perceived functional ability with daily activities.  Baseline:  Goal status: INITIAL  2.  Pt will improve Global ankle strength to a 4+/5 to demonstrate improvement in strength for quality of motion and activity performance.  Baseline:  Goal status: INITIAL  3.  Pt will report the ability to stand for >/= 30 minutes as to demonstrate improved tolerance to standing for prolonged time and improved ability to participate in ADLs.  Baseline: 5 minutes Goal status: INITIAL  4.   Pt will independently ambulate 1067ft with LRAD and less than 2/10 pain to demonstrate improved weightbearing tolerance, BLE strength, and functional capacity for community ambulation.  Baseline: 165ft 8/10 Goal status: INITIAL --------------------------------------------------------------------------------------------- PLAN:  PT FREQUENCY: 1-2x/week  PT DURATION: 6 weeks  PLANNED INTERVENTIONS: 97110-Therapeutic exercises, 97530- Therapeutic activity, [id number]- Neuromuscular re-education, 97535- Self Care, [id number]- Manual therapy, [phone]- Gait training, [phone]- Electrical stimulation (manual), Patient/Family education, Balance training, Stair training, Taping, Joint mobilization, and DME instructions  PLAN FOR NEXT SESSION: progress as tolerated wit ankle stability, functional strengthening and ambulation quality.   Mabel Kiang, PT, DPT 06/10/2024, 9:16 AM  For all possible CPT codes, reference the Planned Interventions line above.     Check all conditions that are expected to impact treatment: {Conditions expected to impact treatment:None of these apply   If treatment provided at initial evaluation, no treatment charged due to lack of authorization.

## 2024-06-09 NOTE — Therapy (Signed)
 OUTPATIENT PHYSICAL THERAPY LOWER TREATMENT  Patient Name: Sandra Lozano MRN: [id number] DOB:[birth year], 63 y.o., female Today's Date: 06/09/2024  END OF SESSION:  PT End of Session - 06/09/24 1448     Visit Number 2    Number of Visits 13    Date for PT Re-Evaluation 07/06/24    PT Start Time 1448    PT Stop Time 1526    PT Time Calculation (min) 38 min    Activity Tolerance Patient tolerated treatment well    Behavior During Therapy [facility] for tasks assessed/performed          Past Medical History:  Diagnosis Date   Arthritis    right knee, lower back   Chest wall pain 04/20/2021   Dyspnea    very rare -tx with albuterol  neb sol if needed   Hyperlipidemia    Hypertension    Intertrigo 08/06/2022   Pre-diabetes    per patient - does not check blood sugar   Right hip pain 05/05/2018   Rupture of anterior cruciate ligament of right knee 02/15/2018   Sprain of medial collateral ligament of right knee 02/15/2018   SVD (spontaneous vaginal delivery)    x 5 - only 2 living, 1 stillborn and 1 premature at 7 months demise,1 child deceased   Wears dentures    upper and lower   Past Surgical History:  Procedure Laterality Date   FOOT ARTHRODESIS Left 11/02/2020   Procedure: LEFT MIDFOOT FUSION BASE 1ST AND 2ND METATARSAL;  Surgeon: Harden Jerona GAILS, MD;  Location: [facility] OR;  Service: Orthopedics;  Laterality: Left;   LUMBAR FUSION  05/20/2019   GILL PROCEDURE, LEFT TRANSFORAMINAL LUMBAR INTERBODY FUSION, PEDICLE INSTRUMENTATION, BILATERAL FUSION (N/A   TOTAL KNEE ARTHROPLASTY Right 08/21/2023   Procedure: RIGHT TOTAL KNEE ARTHROPLASTY;  Surgeon: Harden Jerona GAILS, MD;  Location: [facility] OR;  Service: Orthopedics;  Laterality: Right;   TUBAL LIGATION     Patient Active Problem List   Diagnosis Date Noted   Chronic pain of right ankle 02/01/2024   Benign lichenoid keratosis 12/24/2023   Bruising 12/24/2023   Unilateral primary osteoarthritis, right knee 08/21/2023   Total knee  replacement status, right 08/21/2023   Hidradenitis suppurativa of right axilla 06/03/2023   Vaginal irritation 02/21/2023   Tinea 12/18/2022   Left shoulder pain 08/25/2022   DJD (degenerative joint disease), multiple sites 08/06/2022   COPD, mild (HCC) 02/11/2022   Carpal tunnel syndrome, bilateral 12/21/2021   Arthritis of finger of left hand 11/26/2021   Traumatic arthritis of left foot    Lichen simplex chronicus 06/04/2020   Arthritis of midfoot 03/14/2020   History of lumbar fusion 03/14/2020   Well controlled diabetes mellitus (HCC) 11/07/2019   Nummular eczema 07/06/2019   Lumbar stenosis 05/20/2019   Acute left-sided low back pain without sciatica 07/18/2018   Osteoarthritis of right hip 06/03/2018   Chronic right shoulder pain 05/05/2018   Essential hypertension 02/15/2018    PCP: Antonetta Clanton Bar, MD  REFERRING PROVIDER:   Madelon Donald CHRISTELLA, DO    REFERRING DIAG: [phone] (ICD-10-CM) - Chronic pain of right ankle   Rationale for Evaluation and Treatment: Rehabilitation  THERAPY DIAG:  Pain in right ankle and joints of right foot  Stiffness of right ankle, not elsewhere classified  Other abnormalities of gait and mobility  PERTINENT HISTORY: HTN, COPD, R TKA  WEIGHT BEARING RESTRICTIONS: No  FALLS:  Has patient fallen in last 6 months? No  LIVING ENVIRONMENT: Lives with: lives with their  family Lives in: House/apartment Stairs: Yes: Internal: 12 steps; on right going up Has following equipment at home: None  OCCUPATION: not currently working   PRECAUTIONS: None ---------------------------------------------------------------------------------------------  SUBJECTIVE:   SUBJECTIVE STATEMENT: Pt attended today's session with reports of minimal pain. Pt stated that they have maintained good compliance with current HEP.  Started wearing a brace which helps.   Eval statement 05/25/2024: has been experiencing R ankle pain onset a year, had a  cortisone shot that didn't help. Only has relief from lidocaine  patch, works out every morning. Exercise does ease it a bit, but has to wear a compression sock otherwise her ankle will bother her.  Currently 8/10,  had a R TKA in 2024  RED FLAGS: None   PLOF: Independent  PATIENT GOALS: learn how to walk  NEXT MD VISIT: TBD ---------------------------------------------------------------------------------------------  OBJECTIVE:  Note: Objective measures were completed at Evaluation unless otherwise noted.  DIAGNOSTIC FINDINGS: IMPRESSION: 1. Mild edema within the flexor digitorum brevis muscle just deep to the medial band of the plantar fascia approximately 2.4 cm distal from the plantar fascia origin. Otherwise, the plantar fascia origins are intact. There is a mild-to-moderate plantar calcaneal heel spur. 2. Mild-to-moderate chronic cystic change with mild surrounding marrow edema within the talus at the deltoid ligament insertion. This appears to be the sequela of chronic traction changes at the deltoid ligament insertion. 3. 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 4. Mild tibiotalar and moderate posterior aspect of the posterior subtalar joint cartilage thinning.     Electronically Signed   By: Tanda Lyons M.D.   On: 12/31/2023 12:08  PATIENT SURVEYS:  LEFS  Extreme difficulty/unable (0), Quite a bit of difficulty (1), Moderate difficulty (2), Little difficulty (3), No difficulty (4) Survey date:    Any of your usual work, housework or school activities   2. Usual hobbies, recreational or sporting activities   3. Getting into/out of the bath   4. Walking between rooms   5. Putting on socks/shoes   6. Squatting    7. Lifting an object, like a bag of groceries from the floor   8. Performing light activities around your home   9. Performing heavy activities around your home   10. Getting into/out of a car   11. Walking 2 blocks   12. Walking 1 mile   13. Going  up/down 10 stairs (1 flight)   14. Standing for 1 hour   15.  sitting for 1 hour   16. Running on even ground   17. Running on uneven ground   18. Making sharp turns while running fast   19. Hopping    20. Rolling over in bed   Score total:  35/80     COGNITION: Overall cognitive status: Within functional limits for tasks assessed     SENSATION: WFL  EDEMA:  Trace swelling around Lateral L malleolus  MUSCLE LENGTH: Gastroc limited  POSTURE: No Significant postural limitations  PALPATION: Tenderness to superior lateral malleolus and lateral calcaneus tubercle  LOWER EXTREMITY ROM:  Active ROM Right eval Left eval  Hip flexion    Hip extension    Hip abduction    Hip adduction    Hip internal rotation    Hip external rotation    Knee flexion    Knee extension    Ankle dorsiflexion    Ankle plantarflexion    Ankle inversion    Ankle eversion     (Blank rows = not tested)  ! Indicates pain with testing  LOWER EXTREMITY MMT:  MMT Right eval Left eval  Hip flexion    Hip extension    Hip abduction    Hip adduction    Hip internal rotation    Hip external rotation    Knee flexion    Knee extension    Ankle dorsiflexion    Ankle plantarflexion    Ankle inversion    Ankle eversion     (Blank rows = not tested)  ! Indicates pain with testing  LOWER EXTREMITY SPECIAL TESTS:  Ankle special tests: Anterior drawer test: negative, Great toe extension test: negative, and Dorsiflexion-Eversion test: positive   FUNCTIONAL TESTS:  Timed up and go (TUG): 15s  GAIT: Distance walked: 146ft Assistive device utilized: None Level of assistance: SBA Comments: unsteady gait, antalgic's RLE gait, R weight shift                                                                                                                                TREATMENT:  [facility] Adult PT Treatment:                                                DATE: 06/09/2024 Therapeutic  Activity: NuStep 8' for activity tolerance Ankle 4 way 1x15, hold 2s B heel raise 2x12, UE a. PRN RLE SLS on compliant surface 2x1' Marching on compliant surface 2x12, hold 3s     PATIENT EDUCATION:  Education details: Pt received education regarding HEP performance, ADL performance, functional activity tolerance, impairment education, appropriate performance of therapeutic activities. Person educated: Patient Education method: Explanation, Demonstration, Tactile cues, Verbal cues, and Handouts Education comprehension: verbalized understanding and returned demonstration  HOME EXERCISE PROGRAM: Access Code: [email or web address] URL: [email or web address] Date: 05/25/2024 Prepared by: Mabel Kiang  Exercises - Supine Ankle Circles  - 1 x daily - 7 x weekly - 2 sets - 1 reps - 39m hold - Seated Toe Raise  - 1 x daily - 5-7 x weekly - 3 sets - 10 reps - 2 hold - Seated Heel Raise  - 1 x daily - 5-7 x weekly - 3 sets - 10 reps - 2s hold - Ankle Inversion Eversion Towel Slide  - 1 x daily - 5-7 x weekly - 2-3 sets - 10 reps - 2s hold ---------------------------------------------------------------------------------------------  ASSESSMENT:  CLINICAL IMPRESSION: Pt attended physical therapy session for continuation of treatment regarding R ankle pain. Today's treatment focused on improvement of  R ankle strength and stability. Pt showed good tolerance to administered treatment with no adverse effects by the end of session. Skilled intervention was utilized via activity modification for pt tolerance with task completion, functional progression/regression promoting best outcomes inline with current rehab goals, as well as moderate verbal/tactile cuing alongside no physical assistance for safe and appropriate performance of today's activities. Continue to progress  as tolerated.   Eval impression (05/25/2024): Pt. attended today's physical therapy session for evaluation of R ankle pain.  Pt has complaints of posterior lateral ankle pain that gets up to 8/10 onset pat year. 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 Pt would benefit from therapeutic focus on R ankle stability, strengthening, and ambulation quality.  Treatment performed today focused on pt education detailed in objective. Pt demonstrated good understanding of education provided. required minimal verbal/tactile cues and stand by assistance for appropriate performance with today's activities. Pt requires the intervention of skilled outpatient physical therapy to address the aforementioned deficits and progress towards a functional level in line with therapeutic goals.   OBJECTIVE IMPAIRMENTS: Abnormal gait, decreased activity tolerance, decreased balance, decreased knowledge of condition, decreased mobility, difficulty walking, decreased ROM, decreased strength, increased edema, improper body mechanics, and pain.   ACTIVITY LIMITATIONS: standing, squatting, stairs, and locomotion level  PARTICIPATION LIMITATIONS: interpersonal relationship, driving, shopping, and community activity  PERSONAL FACTORS: Fitness, Past/current experiences, Time since onset of injury/illness/exacerbation, and 1 comorbidity: R TKA within past year are also affecting patient's functional outcome.   REHAB POTENTIAL: Good  CLINICAL DECISION MAKING: Stable/uncomplicated  EVALUATION COMPLEXITY: Low   GOALS: Goals reviewed with patient? YES  SHORT TERM GOALS: Target date: 06/15/2024 Pt will be independent with administered HEP to demonstrate the competency necessary for long term managemnet of symptoms at home.  Baseline: Goal status: INITIAL   LONG TERM GOALS: Target date: 07/06/2024  Pt. Will achieve a LEFS score of 47/80 as to demonstrate improvement in self-perceived functional ability with daily activities.  Baseline:   Goal status: INITIAL  2.  Pt will improve Global ankle strength to a 4+/5 to demonstrate improvement in strength for quality of motion and activity performance.  Baseline:  Goal status: INITIAL  3.  Pt will report the ability to stand for >/= 30 minutes as to demonstrate improved tolerance to standing for prolonged time and improved ability to participate in ADLs.  Baseline: 5 minutes Goal status: INITIAL  4.   Pt will independently ambulate 1062ft with LRAD and less than 2/10 pain to demonstrate improved weightbearing tolerance, BLE strength, and functional capacity for community ambulation.  Baseline: 16ft 8/10 Goal status: INITIAL --------------------------------------------------------------------------------------------- PLAN:  PT FREQUENCY: 1-2x/week  PT DURATION: 6 weeks  PLANNED INTERVENTIONS: 97110-Therapeutic exercises, 97530- Therapeutic activity, [id number]- Neuromuscular re-education, 97535- Self Care, [id number]- Manual therapy, [phone]- Gait training, [phone]- Electrical stimulation (manual), Patient/Family education, Balance training, Stair training, Taping, Joint mobilization, and DME instructions  PLAN FOR NEXT SESSION: progress as tolerated wit ankle stability, functional strengthening and ambulation quality.   Mabel Kiang, PT, DPT 06/09/2024, 3:30 PM  For all possible CPT codes, reference the Planned Interventions line above.     Check all conditions that are expected to impact treatment: {Conditions expected to impact treatment:None of these apply   If treatment provided at initial evaluation, no treatment charged due to lack of authorization.

## 2024-06-10 ENCOUNTER — Other Ambulatory Visit: Payer: Self-pay | Admitting: Family Medicine

## 2024-06-10 ENCOUNTER — Ambulatory Visit

## 2024-06-10 ENCOUNTER — Encounter: Payer: Self-pay | Admitting: Student

## 2024-06-10 DIAGNOSIS — E119 Type 2 diabetes mellitus without complications: Secondary | ICD-10-CM

## 2024-06-10 DIAGNOSIS — M25571 Pain in right ankle and joints of right foot: Secondary | ICD-10-CM

## 2024-06-10 DIAGNOSIS — M25671 Stiffness of right ankle, not elsewhere classified: Secondary | ICD-10-CM

## 2024-06-10 DIAGNOSIS — R2689 Other abnormalities of gait and mobility: Secondary | ICD-10-CM

## 2024-06-13 ENCOUNTER — Other Ambulatory Visit: Payer: Self-pay | Admitting: Family Medicine

## 2024-06-13 ENCOUNTER — Ambulatory Visit

## 2024-06-13 DIAGNOSIS — M25671 Stiffness of right ankle, not elsewhere classified: Secondary | ICD-10-CM | POA: Diagnosis not present

## 2024-06-13 DIAGNOSIS — R2689 Other abnormalities of gait and mobility: Secondary | ICD-10-CM | POA: Diagnosis not present

## 2024-06-13 DIAGNOSIS — M25571 Pain in right ankle and joints of right foot: Secondary | ICD-10-CM | POA: Diagnosis not present

## 2024-06-13 DIAGNOSIS — E119 Type 2 diabetes mellitus without complications: Secondary | ICD-10-CM

## 2024-06-13 DIAGNOSIS — H5213 Myopia, bilateral: Secondary | ICD-10-CM | POA: Diagnosis not present

## 2024-06-13 MED ORDER — OZEMPIC (0.25 OR 0.5 MG/DOSE) 2 MG/3ML ~~LOC~~ SOPN: 0.5000 mg | PEN_INJECTOR | SUBCUTANEOUS | Status: DC

## 2024-06-13 NOTE — Therapy (Signed)
 OUTPATIENT PHYSICAL THERAPY LOWER TREATMENT  Patient Name: Sandra Lozano MRN: [id number] DOB:06/17/1961, 63 y.o., female Today's Date: 06/13/2024  END OF SESSION:  PT End of Session - 06/13/24 0752     Visit Number 4    Number of Visits 13    Date for PT Re-Evaluation 07/06/24    Authorization Type MCD    Authorization Time Period Approved 8 visits starting 05/25/24-07/24/24    Authorization - Visit Number 4    Authorization - Number of Visits 8    PT Start Time 0751    PT Stop Time 0830    PT Time Calculation (min) 39 min    Activity Tolerance Patient tolerated treatment well    Behavior During Therapy [facility] for tasks assessed/performed            Past Medical History:  Diagnosis Date   Arthritis    right knee, lower back   Chest wall pain 04/20/2021   Dyspnea    very rare -tx with albuterol  neb sol if needed   Hyperlipidemia    Hypertension    Intertrigo 08/06/2022   Pre-diabetes    per patient - does not check blood sugar   Right hip pain 05/05/2018   Rupture of anterior cruciate ligament of right knee 02/15/2018   Sprain of medial collateral ligament of right knee 02/15/2018   SVD (spontaneous vaginal delivery)    x 5 - only 2 living, 1 stillborn and 1 premature at 7 months demise,1 child deceased   Wears dentures    upper and lower   Past Surgical History:  Procedure Laterality Date   FOOT ARTHRODESIS Left 11/02/2020   Procedure: LEFT MIDFOOT FUSION BASE 1ST AND 2ND METATARSAL;  Surgeon: Harden Jerona GAILS, MD;  Location: [facility] OR;  Service: Orthopedics;  Laterality: Left;   LUMBAR FUSION  05/20/2019   GILL PROCEDURE, LEFT TRANSFORAMINAL LUMBAR INTERBODY FUSION, PEDICLE INSTRUMENTATION, BILATERAL FUSION (N/A   TOTAL KNEE ARTHROPLASTY Right 08/21/2023   Procedure: RIGHT TOTAL KNEE ARTHROPLASTY;  Surgeon: Harden Jerona GAILS, MD;  Location: [facility] OR;  Service: Orthopedics;  Laterality: Right;   TUBAL LIGATION     Patient Active Problem List   Diagnosis Date Noted   Chronic  pain of right ankle 02/01/2024   Benign lichenoid keratosis 12/24/2023   Bruising 12/24/2023   Unilateral primary osteoarthritis, right knee 08/21/2023   Total knee replacement status, right 08/21/2023   Hidradenitis suppurativa of right axilla 06/03/2023   Vaginal irritation 02/21/2023   Tinea 12/18/2022   Left shoulder pain 08/25/2022   DJD (degenerative joint disease), multiple sites 08/06/2022   COPD, mild (HCC) 02/11/2022   Carpal tunnel syndrome, bilateral 12/21/2021   Arthritis of finger of left hand 11/26/2021   Traumatic arthritis of left foot    Lichen simplex chronicus 06/04/2020   Arthritis of midfoot 03/14/2020   History of lumbar fusion 03/14/2020   Well controlled diabetes mellitus (HCC) 11/07/2019   Nummular eczema 07/06/2019   Lumbar stenosis 05/20/2019   Acute left-sided low back pain without sciatica 07/18/2018   Osteoarthritis of right hip 06/03/2018   Chronic right shoulder pain 05/05/2018   Essential hypertension 02/15/2018    PCP: Nicholas Bar, MD  REFERRING PROVIDER:   Madelon Donald CHRISTELLA, DO    REFERRING DIAG: [phone] (ICD-10-CM) - Chronic pain of right ankle   Rationale for Evaluation and Treatment: Rehabilitation  THERAPY DIAG:  Pain in right ankle and joints of right foot  Stiffness of right ankle, not elsewhere classified  Other  abnormalities of gait and mobility  PERTINENT HISTORY: HTN, COPD, R TKA  WEIGHT BEARING RESTRICTIONS: No  FALLS:  Has patient fallen in last 6 months? No  LIVING ENVIRONMENT: Lives with: lives with their family Lives in: House/apartment Stairs: Yes: Internal: 12 steps; on right going up Has following equipment at home: None  OCCUPATION: not currently working   PRECAUTIONS: None ---------------------------------------------------------------------------------------------  SUBJECTIVE:   SUBJECTIVE STATEMENT:    Eval statement 05/25/2024: has been experiencing R ankle pain onset a year, had a  cortisone shot that didn't help. Only has relief from lidocaine  patch, works out every morning. Exercise does ease it a bit, but has to wear a compression sock otherwise her ankle will bother her.  Currently 8/10,  had a R TKA in 2024  RED FLAGS: None   PLOF: Independent  PATIENT GOALS: learn how to walk  NEXT MD VISIT: TBD ---------------------------------------------------------------------------------------------  OBJECTIVE:  Note: Objective measures were completed at Evaluation unless otherwise noted.  DIAGNOSTIC FINDINGS: IMPRESSION: 1. Mild edema within the flexor digitorum brevis muscle just deep to the medial band of the plantar fascia approximately 2.4 cm distal from the plantar fascia origin. Otherwise, the plantar fascia origins are intact. There is a mild-to-moderate plantar calcaneal heel spur. 2. Mild-to-moderate chronic cystic change with mild surrounding marrow edema within the talus at the deltoid ligament insertion. This appears to be the sequela of chronic traction changes at the deltoid ligament insertion. 3. 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 4. Mild tibiotalar and moderate posterior aspect of the posterior subtalar joint cartilage thinning.     Electronically Signed   By: Tanda Lyons M.D.   On: 12/31/2023 12:08  PATIENT SURVEYS:  LEFS  Extreme difficulty/unable (0), Quite a bit of difficulty (1), Moderate difficulty (2), Little difficulty (3), No difficulty (4) Survey date:    Any of your usual work, housework or school activities   2. Usual hobbies, recreational or sporting activities   3. Getting into/out of the bath   4. Walking between rooms   5. Putting on socks/shoes   6. Squatting    7. Lifting an object, like a bag of groceries from the floor   8. Performing light activities around your home   9. Performing heavy activities around your home   10. Getting into/out of a car   11. Walking 2 blocks   12. Walking 1 mile   13. Going  up/down 10 stairs (1 flight)   14. Standing for 1 hour   15.  sitting for 1 hour   16. Running on even ground   17. Running on uneven ground   18. Making sharp turns while running fast   19. Hopping    20. Rolling over in bed   Score total:  35/80     COGNITION: Overall cognitive status: Within functional limits for tasks assessed     SENSATION: WFL  EDEMA:  Trace swelling around Lateral L malleolus  MUSCLE LENGTH: Gastroc limited  POSTURE: No Significant postural limitations  PALPATION: Tenderness to superior lateral malleolus and lateral calcaneus tubercle  LOWER EXTREMITY ROM:  Active ROM Right eval Left eval  Hip flexion    Hip extension    Hip abduction    Hip adduction    Hip internal rotation    Hip external rotation    Knee flexion    Knee extension    Ankle dorsiflexion    Ankle plantarflexion    Ankle inversion    Ankle eversion     (Blank  rows = not tested)  ! Indicates pain with testing  LOWER EXTREMITY MMT:  MMT Right eval Left eval  Hip flexion    Hip extension    Hip abduction    Hip adduction    Hip internal rotation    Hip external rotation    Knee flexion    Knee extension    Ankle dorsiflexion    Ankle plantarflexion    Ankle inversion    Ankle eversion     (Blank rows = not tested)  ! Indicates pain with testing  LOWER EXTREMITY SPECIAL TESTS:  Ankle special tests: Anterior drawer test: negative, Great toe extension test: negative, and Dorsiflexion-Eversion test: positive   FUNCTIONAL TESTS:  Timed up and go (TUG): 15s  GAIT: Distance walked: 158ft Assistive device utilized: None Level of assistance: SBA Comments: unsteady gait, antalgic's RLE gait, R weight shift                                                                                                                                TREATMENT: [facility] Adult PT Treatment:                                                DATE: 06/13/24 (Treatment performed outside  of [facility]) Therapeutic Exercise: Nustep L4 8 min Neuromuscular re-ed: Heel raise over 4 in step 15x Runners step 4 in 10/10 5# KB placement on 4 in step R for DF strength Rearfoot balance on airex R 60s Therapeutic Activity: PF stretch with towel 60s x2 4 way ankle RTB 15x2 ea.  [facility] Adult PT Treatment:                                                DATE: 06/10/24 Therapeutic Exercise: Nustep L3 8 min Neuromuscular re-ed: PF against wall 15x R SLS rearfoot on Airex with UE support, 30/60s  Therapeutic Activity: PF stretch with towel 60s x2 4 way ankle YTB 15x2 ea.  [facility] Adult PT Treatment:                                                DATE: 06/09/2024 Therapeutic Activity: NuStep 8' for activity tolerance Ankle 4 way 1x15, hold 2s B heel raise 2x12, UE a. PRN RLE SLS on compliant surface 2x1' Marching on compliant surface 2x12, hold 3s     PATIENT EDUCATION:  Education details: Pt received education regarding HEP performance, ADL performance, functional activity tolerance, impairment education, appropriate performance of therapeutic activities. Person educated: Patient Education method: Explanation, Demonstration, Tactile  cues, Verbal cues, and Handouts Education comprehension: verbalized understanding and returned demonstration  HOME EXERCISE PROGRAM: Access Code: [email or web address] URL: [email or web address] Date: 05/25/2024 Prepared by: Mabel Kiang  Exercises - Supine Ankle Circles  - 1 x daily - 7 x weekly - 2 sets - 1 reps - 54m hold - Seated Toe Raise  - 1 x daily - 5-7 x weekly - 3 sets - 10 reps - 2 hold - Seated Heel Raise  - 1 x daily - 5-7 x weekly - 3 sets - 10 reps - 2s hold - Ankle Inversion Eversion Towel Slide  - 1 x daily - 5-7 x weekly - 2-3 sets - 10 reps - 2s hold ---------------------------------------------------------------------------------------------  ASSESSMENT:  CLINICAL IMPRESSION: Today's session performed w/o brace to begin  proprioceptive work.  Increased resistance on T-band exercises, added SLS tasks and NRE tasks to promote strength and stability.   Eval impression (05/25/2024): Pt. attended today's physical therapy session for evaluation of R ankle pain. Pt has complaints of posterior lateral ankle pain that gets up to 8/10 onset pat year. 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 Pt would benefit from therapeutic focus on R ankle stability, strengthening, and ambulation quality.  Treatment performed today focused on pt education detailed in objective. Pt demonstrated good understanding of education provided. required minimal verbal/tactile cues and stand by assistance for appropriate performance with today's activities. Pt requires the intervention of skilled outpatient physical therapy to address the aforementioned deficits and progress towards a functional level in line with therapeutic goals.   OBJECTIVE IMPAIRMENTS: Abnormal gait, decreased activity tolerance, decreased balance, decreased knowledge of condition, decreased mobility, difficulty walking, decreased ROM, decreased strength, increased edema, improper body mechanics, and pain.   ACTIVITY LIMITATIONS: standing, squatting, stairs, and locomotion level  PARTICIPATION LIMITATIONS: interpersonal relationship, driving, shopping, and community activity  PERSONAL FACTORS: Fitness, Past/current experiences, Time since onset of injury/illness/exacerbation, and 1 comorbidity: R TKA within past year are also affecting patient's functional outcome.   REHAB POTENTIAL: Good  CLINICAL DECISION MAKING: Stable/uncomplicated  EVALUATION COMPLEXITY: Low   GOALS: Goals reviewed with patient? YES  SHORT TERM GOALS: Target date: 06/15/2024 Pt will be independent with administered HEP to demonstrate the competency necessary for long term managemnet of  symptoms at home.  Baseline: Goal status: INITIAL   LONG TERM GOALS: Target date: 07/06/2024  Pt. Will achieve a LEFS score of 47/80 as to demonstrate improvement in self-perceived functional ability with daily activities.  Baseline:  Goal status: INITIAL  2.  Pt will improve Global ankle strength to a 4+/5 to demonstrate improvement in strength for quality of motion and activity performance.  Baseline:  Goal status: INITIAL  3.  Pt will report the ability to stand for >/= 30 minutes as to demonstrate improved tolerance to standing for prolonged time and improved ability to participate in ADLs.  Baseline: 5 minutes Goal status: INITIAL  4.   Pt will independently ambulate 103ft with LRAD and less than 2/10 pain to demonstrate improved weightbearing tolerance, BLE strength, and functional capacity for community ambulation.  Baseline: 168ft 8/10 Goal status: INITIAL --------------------------------------------------------------------------------------------- PLAN:  PT FREQUENCY: 1-2x/week  PT DURATION: 6 weeks  PLANNED INTERVENTIONS: 97110-Therapeutic exercises, 97530- Therapeutic activity, [id number]- Neuromuscular re-education, 97535- Self Care, [id number]- Manual therapy, [phone]- Gait training, [phone]- Electrical stimulation (manual), Patient/Family education, Balance training, Stair training, Taping, Joint mobilization, and DME instructions  PLAN FOR NEXT SESSION: progress as  tolerated wit ankle stability, functional strengthening and ambulation quality.   Jeff Ayaansh Smail PT  06/13/2024, 8:30 AM  For all possible CPT codes, reference the Planned Interventions line above.     Check all conditions that are expected to impact treatment: {Conditions expected to impact treatment:None of these apply   If treatment provided at initial evaluation, no treatment charged due to lack of authorization.

## 2024-06-15 ENCOUNTER — Ambulatory Visit: Admitting: Family Medicine

## 2024-06-15 ENCOUNTER — Encounter: Payer: Self-pay | Admitting: Family Medicine

## 2024-06-15 ENCOUNTER — Ambulatory Visit: Attending: Family Medicine | Admitting: Physical Therapy

## 2024-06-15 VITALS — BP 172/82 | Ht 64.0 in | Wt 194.0 lb

## 2024-06-15 DIAGNOSIS — M25671 Stiffness of right ankle, not elsewhere classified: Secondary | ICD-10-CM | POA: Insufficient documentation

## 2024-06-15 DIAGNOSIS — R2689 Other abnormalities of gait and mobility: Secondary | ICD-10-CM | POA: Insufficient documentation

## 2024-06-15 DIAGNOSIS — M6281 Muscle weakness (generalized): Secondary | ICD-10-CM | POA: Diagnosis not present

## 2024-06-15 DIAGNOSIS — M25561 Pain in right knee: Secondary | ICD-10-CM | POA: Diagnosis not present

## 2024-06-15 DIAGNOSIS — G5603 Carpal tunnel syndrome, bilateral upper limbs: Secondary | ICD-10-CM | POA: Diagnosis not present

## 2024-06-15 DIAGNOSIS — G8929 Other chronic pain: Secondary | ICD-10-CM | POA: Diagnosis not present

## 2024-06-15 DIAGNOSIS — M25571 Pain in right ankle and joints of right foot: Secondary | ICD-10-CM | POA: Insufficient documentation

## 2024-06-15 MED ORDER — PREDNISONE 10 MG PO TABS: ORAL_TABLET | ORAL | 0 refills | Status: AC

## 2024-06-15 NOTE — Patient Instructions (Signed)
 You have carpal tunnel syndrome. Wear the wrist brace at nighttime and as often as possible during the day Stop the meloxicam  for now but restart it after you finish the prednisone . Take the prednisone  as directed x 6 days. We will go ahead with the nerve conduction study of both arms to assess the severity of your carpal tunnel - this will dictate if we can repeat the shots or we need to refer you to the surgeon.

## 2024-06-15 NOTE — Therapy (Signed)
 OUTPATIENT PHYSICAL THERAPY LOWER TREATMENT  Patient Name: Sandra Lozano MRN: [id number] DOB:[birth year], 63 y.o., female Today's Date: 06/15/2024  END OF SESSION:  PT End of Session - 06/15/24 1208     Visit Number 5    Number of Visits 13    Date for PT Re-Evaluation 07/06/24    Authorization Type MCD    Authorization Time Period Approved 8 visits starting 05/25/24-07/24/24    Authorization - Visit Number 5    Authorization - Number of Visits 8    PT Start Time 1130    PT Stop Time 1210    PT Time Calculation (min) 40 min    Activity Tolerance Patient tolerated treatment well    Behavior During Therapy [facility] for tasks assessed/performed             Past Medical History:  Diagnosis Date   Arthritis    right knee, lower back   Chest wall pain 04/20/2021   Dyspnea    very rare -tx with albuterol  neb sol if needed   Hyperlipidemia    Hypertension    Intertrigo 08/06/2022   Pre-diabetes    per patient - does not check blood sugar   Right hip pain 05/05/2018   Rupture of anterior cruciate ligament of right knee 02/15/2018   Sprain of medial collateral ligament of right knee 02/15/2018   SVD (spontaneous vaginal delivery)    x 5 - only 2 living, 1 stillborn and 1 premature at 7 months demise,1 child deceased   Wears dentures    upper and lower   Past Surgical History:  Procedure Laterality Date   FOOT ARTHRODESIS Left 11/02/2020   Procedure: LEFT MIDFOOT FUSION BASE 1ST AND 2ND METATARSAL;  Surgeon: Harden Jerona GAILS, MD;  Location: [facility] OR;  Service: Orthopedics;  Laterality: Left;   LUMBAR FUSION  05/20/2019   GILL PROCEDURE, LEFT TRANSFORAMINAL LUMBAR INTERBODY FUSION, PEDICLE INSTRUMENTATION, BILATERAL FUSION (N/A   TOTAL KNEE ARTHROPLASTY Right 08/21/2023   Procedure: RIGHT TOTAL KNEE ARTHROPLASTY;  Surgeon: Harden Jerona GAILS, MD;  Location: [facility] OR;  Service: Orthopedics;  Laterality: Right;   TUBAL LIGATION     Patient Active Problem List   Diagnosis Date Noted    Chronic pain of right ankle 02/01/2024   Benign lichenoid keratosis 12/24/2023   Bruising 12/24/2023   Unilateral primary osteoarthritis, right knee 08/21/2023   Total knee replacement status, right 08/21/2023   Hidradenitis suppurativa of right axilla 06/03/2023   Vaginal irritation 02/21/2023   Tinea 12/18/2022   Left shoulder pain 08/25/2022   DJD (degenerative joint disease), multiple sites 08/06/2022   COPD, mild (HCC) 02/11/2022   Carpal tunnel syndrome, bilateral 12/21/2021   Arthritis of finger of left hand 11/26/2021   Traumatic arthritis of left foot    Lichen simplex chronicus 06/04/2020   Arthritis of midfoot 03/14/2020   History of lumbar fusion 03/14/2020   Well controlled diabetes mellitus (HCC) 11/07/2019   Nummular eczema 07/06/2019   Lumbar stenosis 05/20/2019   Acute left-sided low back pain without sciatica 07/18/2018   Osteoarthritis of right hip 06/03/2018   Chronic right shoulder pain 05/05/2018   Essential hypertension 02/15/2018    PCP: Keayra Graham Bar, MD  REFERRING PROVIDER:   Madelon Donald CHRISTELLA, DO    REFERRING DIAG: [id number] (ICD-10-CM) - Chronic pain of right ankle   Rationale for Evaluation and Treatment: Rehabilitation  THERAPY DIAG:  Pain in right ankle and joints of right foot  Stiffness of right ankle, not elsewhere classified  Other abnormalities of gait and mobility  Chronic pain of right knee  Muscle weakness (generalized)  PERTINENT HISTORY: HTN, COPD, R TKA  WEIGHT BEARING RESTRICTIONS: No  FALLS:  Has patient fallen in last 6 months? No  LIVING ENVIRONMENT: Lives with: lives with their family Lives in: House/apartment Stairs: Yes: Internal: 12 steps; on right going up Has following equipment at home: None  OCCUPATION: not currently working   PRECAUTIONS: None ---------------------------------------------------------------------------------------------  SUBJECTIVE:   SUBJECTIVE STATEMENT:  Pt attended  today's session with reports of 2/10 pain. Pt stated that they have maintained good compliance with current HEP.  Took off the R sided ankle/knee braces because of her leg feeling constricted and it feels better now that the brace isn't on anymore.   Eval statement 05/25/2024: has been experiencing R ankle pain onset a year, had a cortisone shot that didn't help. Only has relief from lidocaine  patch, works out every morning. Exercise does ease it a bit, but has to wear a compression sock otherwise her ankle will bother her.  Currently 8/10,  had a R TKA in 2024  RED FLAGS: None   PLOF: Independent  PATIENT GOALS: learn how to walk  NEXT MD VISIT: TBD ---------------------------------------------------------------------------------------------  OBJECTIVE:  Note: Objective measures were completed at Evaluation unless otherwise noted.  DIAGNOSTIC FINDINGS: IMPRESSION: 1. Mild edema within the flexor digitorum brevis muscle just deep to the medial band of the plantar fascia approximately 2.4 cm distal from the plantar fascia origin. Otherwise, the plantar fascia origins are intact. There is a mild-to-moderate plantar calcaneal heel spur. 2. Mild-to-moderate chronic cystic change with mild surrounding marrow edema within the talus at the deltoid ligament insertion. This appears to be the sequela of chronic traction changes at the deltoid ligament insertion. 3. 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 4. Mild tibiotalar and moderate posterior aspect of the posterior subtalar joint cartilage thinning.     Electronically Signed   By: Tanda Lyons M.D.   On: 12/31/2023 12:08  PATIENT SURVEYS:  LEFS  Extreme difficulty/unable (0), Quite a bit of difficulty (1), Moderate difficulty (2), Little difficulty (3), No difficulty (4) Survey date:    Any of your usual work, housework or school activities   2. Usual hobbies, recreational or sporting activities   3. Getting into/out of the  bath   4. Walking between rooms   5. Putting on socks/shoes   6. Squatting    7. Lifting an object, like a bag of groceries from the floor   8. Performing light activities around your home   9. Performing heavy activities around your home   10. Getting into/out of a car   11. Walking 2 blocks   12. Walking 1 mile   13. Going up/down 10 stairs (1 flight)   14. Standing for 1 hour   15.  sitting for 1 hour   16. Running on even ground   17. Running on uneven ground   18. Making sharp turns while running fast   19. Hopping    20. Rolling over in bed   Score total:  35/80     COGNITION: Overall cognitive status: Within functional limits for tasks assessed     SENSATION: WFL  EDEMA:  Trace swelling around Lateral L malleolus  MUSCLE LENGTH: Gastroc limited  POSTURE: No Significant postural limitations  PALPATION: Tenderness to superior lateral malleolus and lateral calcaneus tubercle  LOWER EXTREMITY ROM:  Active ROM Right eval Left eval  Hip flexion    Hip extension  Hip abduction    Hip adduction    Hip internal rotation    Hip external rotation    Knee flexion    Knee extension    Ankle dorsiflexion    Ankle plantarflexion    Ankle inversion    Ankle eversion     (Blank rows = not tested)  ! Indicates pain with testing  LOWER EXTREMITY MMT:  MMT Right eval Left eval  Hip flexion    Hip extension    Hip abduction    Hip adduction    Hip internal rotation    Hip external rotation    Knee flexion    Knee extension    Ankle dorsiflexion    Ankle plantarflexion    Ankle inversion    Ankle eversion     (Blank rows = not tested)  ! Indicates pain with testing  LOWER EXTREMITY SPECIAL TESTS:  Ankle special tests: Anterior drawer test: negative, Great toe extension test: negative, and Dorsiflexion-Eversion test: positive   FUNCTIONAL TESTS:  Timed up and go (TUG): 15s  GAIT: Distance walked: 140ft Assistive device utilized: None Level of  assistance: SBA Comments: unsteady gait, antalgic's RLE gait, R weight shift                                                                                                                                TREATMENT:   [facility] Adult PT Treatment:                                                DATE: 06/15/2024  Therapeutic Exercise: Slant board gastroc stretch 2x1' Slant board on 6 box soleus stretch 2x1' Therapeutic Activity: NuStep Lvl 5 8' B heel raise on slant board, 2x12, hold 4s in 10d of PF Runners step 6 2x12,  Split stance on balance lead RLE 2x2', manual pertubation's to disc. Pt EC on 2nd set    [facility] Adult PT Treatment:                                                DATE: 06/13/24 (Treatment performed outside of [facility]) Therapeutic Exercise: Nustep L4 8 min Neuromuscular re-ed: Heel raise over 4 in step 15x Runners step 4 in 10/10 5# KB placement on 4 in step R for DF strength Rearfoot balance on airex R 60s Therapeutic Activity: PF stretch with towel 60s x2 4 way ankle RTB 15x2 ea.     PATIENT EDUCATION:  Education details: Pt received education regarding HEP performance, ADL performance, functional activity tolerance, impairment education, appropriate performance of therapeutic activities. Person educated: Patient Education method: Explanation, Demonstration, Tactile cues, Verbal cues, and Handouts Education comprehension: verbalized understanding  and returned demonstration  HOME EXERCISE PROGRAM: Access Code: [email or web address] URL: [email or web address] Date: 05/25/2024 Prepared by: Mabel Kiang  Exercises - Supine Ankle Circles  - 1 x daily - 7 x weekly - 2 sets - 1 reps - 28m hold - Seated Toe Raise  - 1 x daily - 5-7 x weekly - 3 sets - 10 reps - 2 hold - Seated Heel Raise  - 1 x daily - 5-7 x weekly - 3 sets - 10 reps - 2s hold - Ankle Inversion Eversion Towel Slide  - 1 x daily - 5-7 x weekly - 2-3 sets - 10 reps - 2s  hold ---------------------------------------------------------------------------------------------  ASSESSMENT:  CLINICAL IMPRESSION:  Pt attended physical therapy session for continuation of treatment regarding R ankle pain. Today's treatment focused on improvement of  R ankle stability, WB tolerance, and intrinsic foot strengthening. Pt is demonstrating progress with daily functional ability by reporting less reliance on ankle brace and improved activity tolerance in the clinic.  Pt showed great tolerance to administered treatment with no adverse effects by the end of session. Skilled intervention was utilized via activity modification for pt tolerance with task completion, functional progression/regression promoting best outcomes inline with current rehab goals, as well as minimal verbal/tactile cuing alongside no physical assistance for safe and appropriate performance of today's activities. Continue to progress with this therapeutic focus, promote less reliance on brace and education for ambulation quality outside of clinic.   Eval impression (05/25/2024): Pt. attended today's physical therapy session for evaluation of R ankle pain. Pt has complaints of posterior lateral ankle pain that gets up to 8/10 onset pat year. 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 Pt would benefit from therapeutic focus on R ankle stability, strengthening, and ambulation quality.  Treatment performed today focused on pt education detailed in objective. Pt demonstrated good understanding of education provided. required minimal verbal/tactile cues and stand by assistance for appropriate performance with today's activities. Pt requires the intervention of skilled outpatient physical therapy to address the aforementioned deficits and progress towards a functional level in line with therapeutic  goals.   OBJECTIVE IMPAIRMENTS: Abnormal gait, decreased activity tolerance, decreased balance, decreased knowledge of condition, decreased mobility, difficulty walking, decreased ROM, decreased strength, increased edema, improper body mechanics, and pain.   ACTIVITY LIMITATIONS: standing, squatting, stairs, and locomotion level  PARTICIPATION LIMITATIONS: interpersonal relationship, driving, shopping, and community activity  PERSONAL FACTORS: Fitness, Past/current experiences, Time since onset of injury/illness/exacerbation, and 1 comorbidity: R TKA within past year are also affecting patient's functional outcome.   REHAB POTENTIAL: Good  CLINICAL DECISION MAKING: Stable/uncomplicated  EVALUATION COMPLEXITY: Low   GOALS: Goals reviewed with patient? YES  SHORT TERM GOALS: Target date: 06/15/2024 Pt will be independent with administered HEP to demonstrate the competency necessary for long term managemnet of symptoms at home.  Baseline: Goal status: INITIAL   LONG TERM GOALS: Target date: 07/06/2024  Pt. Will achieve a LEFS score of 47/80 as to demonstrate improvement in self-perceived functional ability with daily activities.  Baseline:  Goal status: INITIAL  2.  Pt will improve Global ankle strength to a 4+/5 to demonstrate improvement in strength for quality of motion and activity performance.  Baseline:  Goal status: INITIAL  3.  Pt will report the ability to stand for >/= 30 minutes as to demonstrate improved tolerance to standing for prolonged time and improved ability to participate in ADLs.  Baseline:  5 minutes Goal status: INITIAL  4.   Pt will independently ambulate 1027ft with LRAD and less than 2/10 pain to demonstrate improved weightbearing tolerance, BLE strength, and functional capacity for community ambulation.  Baseline: 190ft 8/10 Goal status: INITIAL --------------------------------------------------------------------------------------------- PLAN:  PT  FREQUENCY: 1-2x/week  PT DURATION: 6 weeks  PLANNED INTERVENTIONS: 97110-Therapeutic exercises, 97530- Therapeutic activity, 97112- Neuromuscular re-education, 97535- Self Care, 02859- Manual therapy, [phone]- Gait training, [phone]- Electrical stimulation (manual), Patient/Family education, Balance training, Stair training, Taping, Joint mobilization, and DME instructions  PLAN FOR NEXT SESSION: Continue to progress with this therapeutic focus, promote less reliance on brace and education for ambulation quality outside of clinic.   Mabel Kiang, PT, DPT 06/15/2024, 12:09 PM   For all possible CPT codes, reference the Planned Interventions line above.     Check all conditions that are expected to impact treatment: {Conditions expected to impact treatment:None of these apply   If treatment provided at initial evaluation, no treatment charged due to lack of authorization.

## 2024-06-15 NOTE — Progress Notes (Signed)
 PCP: Nicholas Bar, MD  Subjective:   HPI: Patient is a 63 y.o. female here for bilateral wrist pain.  Patient reports she's continued to have problems with bilateral wrists. She was seen here in 08/2022 by Dr. Jamison for pain/numbness that had been ongoing for 6 months.  Reports injection to right carpal tunnel did help her but pain and numbness recurred. Has been present more noticeably the past 6 months. Right worse than left. Numbness into hands and all digits. Has been wearing wrist braces at night and using topical patches. She has 'borderline' diabetes. Has not had nerve conduction studies.  Past Medical History:  Diagnosis Date   Arthritis    right knee, lower back   Chest wall pain 04/20/2021   Dyspnea    very rare -tx with albuterol  neb sol if needed   Hyperlipidemia    Hypertension    Intertrigo 08/06/2022   Pre-diabetes    per patient - does not check blood sugar   Right hip pain 05/05/2018   Rupture of anterior cruciate ligament of right knee 02/15/2018   Sprain of medial collateral ligament of right knee 02/15/2018   SVD (spontaneous vaginal delivery)    x 5 - only 2 living, 1 stillborn and 1 premature at 7 months demise,1 child deceased   Wears dentures    upper and lower    Current Outpatient Medications on File Prior to Visit  Medication Sig Dispense Refill   acetaminophen  (TYLENOL ) 650 MG CR tablet Take 1 tablet (650 mg total) by mouth every 8 (eight) hours as needed for pain. 90 tablet 0   atorvastatin  (LIPITOR) 20 MG tablet TAKE 1 TABLET BY MOUTH ONCE DAILY IN THE EVENING 90 tablet 3   gabapentin  (NEURONTIN ) 600 MG tablet TAKE 1 & 1/2 (ONE & ONE-HALF) TABLETS BY MOUTH ONCE DAILY AT BEDTIME 60 tablet 0   lisinopril -hydrochlorothiazide  (ZESTORETIC ) 20-12.5 MG tablet Take 2 tablets by mouth once daily 180 tablet 3   meloxicam  (MOBIC ) 15 MG tablet Take 1 tablet (15 mg total) by mouth daily as needed for pain. 30 tablet 0   metFORMIN  (GLUCOPHAGE ) 500 MG  tablet Take 1 tablet by mouth once daily with breakfast 90 tablet 0   Multiple Minerals-Vitamins (CAL-MAG-ZINC-D PO) Take 1 tablet by mouth in the morning.     Omega-3 Fatty Acids (FISH OIL  PO) Take 1 capsule by mouth in the morning.     RESTASIS 0.05 % ophthalmic emulsion Place 1 drop into both eyes 2 (two) times daily.     Semaglutide ,0.25 or 0.5MG /DOS, (OZEMPIC , 0.25 OR 0.5 MG/DOSE,) 2 MG/3ML SOPN Inject 0.5 mg into the skin once a week.     Skin Oils (CASTOR OIL EX) Apply 1 Application topically daily. Applied to knees     TURMERIC PO Take 1 capsule by mouth in the morning.     No current facility-administered medications on file prior to visit.    Past Surgical History:  Procedure Laterality Date   FOOT ARTHRODESIS Left 11/02/2020   Procedure: LEFT MIDFOOT FUSION BASE 1ST AND 2ND METATARSAL;  Surgeon: Harden Jerona GAILS, MD;  Location: [facility] OR;  Service: Orthopedics;  Laterality: Left;   LUMBAR FUSION  05/20/2019   GILL PROCEDURE, LEFT TRANSFORAMINAL LUMBAR INTERBODY FUSION, PEDICLE INSTRUMENTATION, BILATERAL FUSION (N/A   TOTAL KNEE ARTHROPLASTY Right 08/21/2023   Procedure: RIGHT TOTAL KNEE ARTHROPLASTY;  Surgeon: Harden Jerona GAILS, MD;  Location: [facility] OR;  Service: Orthopedics;  Laterality: Right;   TUBAL LIGATION  No Known Allergies  BP (!) 172/82   Ht 5' 4 (1.626 m)   Wt 194 lb (88 kg)   LMP  (LMP Unknown)   BMI 33.30 kg/m       No data to display              No data to display              Objective:  Physical Exam:  Gen: NAD, comfortable in exam room  Bilateral wrists/hands: No deformity, swelling, bruising. FROM with 5/5 strength finger abduction, extension, thumb opposition. No tenderness to palpation. Positive tinels bilateral carpal tunnels, right guyon's canal. Positive phalens. NVI distally.   Assessment & Plan:  1. Bilateral carpal tunnel syndrome - with some symptoms right guyon's canal.  Unfortunately this has been present now since early 2023  though potentially resolved with right sided injection 08/2022.  Advised we go ahead with nerve conduction studies to assess severity - concern if this is moderate-severe in severity she may need surgical intervention.  If mild-moderate we can consider repeating injections.  In meantime oral prednisone  dose pack, wrist braces at bedtime.

## 2024-06-20 ENCOUNTER — Encounter: Payer: Self-pay | Admitting: Physical Therapy

## 2024-06-20 ENCOUNTER — Encounter: Payer: Self-pay | Admitting: Physical Medicine and Rehabilitation

## 2024-06-20 ENCOUNTER — Ambulatory Visit: Admitting: Physical Therapy

## 2024-06-20 ENCOUNTER — Ambulatory Visit (INDEPENDENT_AMBULATORY_CARE_PROVIDER_SITE_OTHER): Admitting: Physical Medicine and Rehabilitation

## 2024-06-20 DIAGNOSIS — M5441 Lumbago with sciatica, right side: Secondary | ICD-10-CM | POA: Diagnosis not present

## 2024-06-20 DIAGNOSIS — R2689 Other abnormalities of gait and mobility: Secondary | ICD-10-CM | POA: Diagnosis not present

## 2024-06-20 DIAGNOSIS — M25561 Pain in right knee: Secondary | ICD-10-CM | POA: Diagnosis not present

## 2024-06-20 DIAGNOSIS — G8929 Other chronic pain: Secondary | ICD-10-CM

## 2024-06-20 DIAGNOSIS — M25671 Stiffness of right ankle, not elsewhere classified: Secondary | ICD-10-CM

## 2024-06-20 DIAGNOSIS — M961 Postlaminectomy syndrome, not elsewhere classified: Secondary | ICD-10-CM | POA: Diagnosis not present

## 2024-06-20 DIAGNOSIS — M6281 Muscle weakness (generalized): Secondary | ICD-10-CM | POA: Diagnosis not present

## 2024-06-20 DIAGNOSIS — M5416 Radiculopathy, lumbar region: Secondary | ICD-10-CM | POA: Diagnosis not present

## 2024-06-20 DIAGNOSIS — M25571 Pain in right ankle and joints of right foot: Secondary | ICD-10-CM

## 2024-06-20 NOTE — Therapy (Signed)
 OUTPATIENT PHYSICAL THERAPY LOWER TREATMENT  Patient Name: Sandra Lozano MRN: [id number] DOB:19-Jul-1961, 63 y.o., female Today's Date: 06/20/2024  END OF SESSION:  PT End of Session - 06/20/24 1209     Visit Number 6    Number of Visits 13    Date for PT Re-Evaluation 07/06/24    Authorization Type MCD    Authorization Time Period Approved 8 visits starting 05/25/24-07/24/24    Authorization - Visit Number 6    Authorization - Number of Visits 8    PT Start Time 1130    PT Stop Time 1210    PT Time Calculation (min) 40 min    Activity Tolerance Patient tolerated treatment well    Behavior During Therapy [facility] for tasks assessed/performed              Past Medical History:  Diagnosis Date   Arthritis    right knee, lower back   Chest wall pain 04/20/2021   Dyspnea    very rare -tx with albuterol  neb sol if needed   Hyperlipidemia    Hypertension    Intertrigo 08/06/2022   Pre-diabetes    per patient - does not check blood sugar   Right hip pain 05/05/2018   Rupture of anterior cruciate ligament of right knee 02/15/2018   Sprain of medial collateral ligament of right knee 02/15/2018   SVD (spontaneous vaginal delivery)    x 5 - only 2 living, 1 stillborn and 1 premature at 7 months demise,1 child deceased   Wears dentures    upper and lower   Past Surgical History:  Procedure Laterality Date   FOOT ARTHRODESIS Left 11/02/2020   Procedure: LEFT MIDFOOT FUSION BASE 1ST AND 2ND METATARSAL;  Surgeon: Harden Jerona GAILS, MD;  Location: [facility] OR;  Service: Orthopedics;  Laterality: Left;   LUMBAR FUSION  05/20/2019   GILL PROCEDURE, LEFT TRANSFORAMINAL LUMBAR INTERBODY FUSION, PEDICLE INSTRUMENTATION, BILATERAL FUSION (N/A   TOTAL KNEE ARTHROPLASTY Right 08/21/2023   Procedure: RIGHT TOTAL KNEE ARTHROPLASTY;  Surgeon: Harden Jerona GAILS, MD;  Location: [facility] OR;  Service: Orthopedics;  Laterality: Right;   TUBAL LIGATION     Patient Active Problem List   Diagnosis Date Noted    Chronic pain of right ankle 02/01/2024   Benign lichenoid keratosis 12/24/2023   Bruising 12/24/2023   Unilateral primary osteoarthritis, right knee 08/21/2023   Total knee replacement status, right 08/21/2023   Hidradenitis suppurativa of right axilla 06/03/2023   Vaginal irritation 02/21/2023   Tinea 12/18/2022   Left shoulder pain 08/25/2022   DJD (degenerative joint disease), multiple sites 08/06/2022   COPD, mild (HCC) 02/11/2022   Carpal tunnel syndrome, bilateral 12/21/2021   Arthritis of finger of left hand 11/26/2021   Traumatic arthritis of left foot    Lichen simplex chronicus 06/04/2020   Arthritis of midfoot 03/14/2020   History of lumbar fusion 03/14/2020   Well controlled diabetes mellitus (HCC) 11/07/2019   Nummular eczema 07/06/2019   Lumbar stenosis 05/20/2019   Acute left-sided low back pain without sciatica 07/18/2018   Osteoarthritis of right hip 06/03/2018   Chronic right shoulder pain 05/05/2018   Essential hypertension 02/15/2018    PCP: Breonia Kirstein Bar, MD  REFERRING PROVIDER:   Madelon Donald CHRISTELLA, DO    REFERRING DIAG: [phone] (ICD-10-CM) - Chronic pain of right ankle   Rationale for Evaluation and Treatment: Rehabilitation  THERAPY DIAG:  Stiffness of right ankle, not elsewhere classified  Pain in right ankle and joints of right foot  Other abnormalities of gait and mobility  Chronic pain of right knee  PERTINENT HISTORY: HTN, COPD, R TKA  WEIGHT BEARING RESTRICTIONS: No  FALLS:  Has patient fallen in last 6 months? No  LIVING ENVIRONMENT: Lives with: lives with their family Lives in: House/apartment Stairs: Yes: Internal: 12 steps; on right going up Has following equipment at home: None  OCCUPATION: not currently working   PRECAUTIONS: None ---------------------------------------------------------------------------------------------  SUBJECTIVE:   SUBJECTIVE STATEMENT:  Pt attended today's session with reports of  0/10 pain. Pt stated that they have maintained fair compliance with current HEP.  Has been ensuring to walk and do some exercises with brace, no longer has pain, however does notice walking sturdier with the brace.    Eval statement 05/25/2024: has been experiencing R ankle pain onset a year, had a cortisone shot that didn't help. Only has relief from lidocaine  patch, works out every morning. Exercise does ease it a bit, but has to wear a compression sock otherwise her ankle will bother her.  Currently 8/10,  had a R TKA in 2024  RED FLAGS: None   PLOF: Independent  PATIENT GOALS: learn how to walk  NEXT MD VISIT: TBD ---------------------------------------------------------------------------------------------  OBJECTIVE:  Note: Objective measures were completed at Evaluation unless otherwise noted.  DIAGNOSTIC FINDINGS: IMPRESSION: 1. Mild edema within the flexor digitorum brevis muscle just deep to the medial band of the plantar fascia approximately 2.4 cm distal from the plantar fascia origin. Otherwise, the plantar fascia origins are intact. There is a mild-to-moderate plantar calcaneal heel spur. 2. Mild-to-moderate chronic cystic change with mild surrounding marrow edema within the talus at the deltoid ligament insertion. This appears to be the sequela of chronic traction changes at the deltoid ligament insertion. 3. 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 4. Mild tibiotalar and moderate posterior aspect of the posterior subtalar joint cartilage thinning.     Electronically Signed   By: Tanda Lyons M.D.   On: 12/31/2023 12:08  PATIENT SURVEYS:  LEFS  Extreme difficulty/unable (0), Quite a bit of difficulty (1), Moderate difficulty (2), Little difficulty (3), No difficulty (4) Survey date:    Any of your usual work, housework or school activities   2. Usual hobbies, recreational or sporting activities   3. Getting into/out of the bath   4. Walking between rooms    5. Putting on socks/shoes   6. Squatting    7. Lifting an object, like a bag of groceries from the floor   8. Performing light activities around your home   9. Performing heavy activities around your home   10. Getting into/out of a car   11. Walking 2 blocks   12. Walking 1 mile   13. Going up/down 10 stairs (1 flight)   14. Standing for 1 hour   15.  sitting for 1 hour   16. Running on even ground   17. Running on uneven ground   18. Making sharp turns while running fast   19. Hopping    20. Rolling over in bed   Score total:  35/80     COGNITION: Overall cognitive status: Within functional limits for tasks assessed     SENSATION: WFL  EDEMA:  Trace swelling around Lateral L malleolus  MUSCLE LENGTH: Gastroc limited  POSTURE: No Significant postural limitations  PALPATION: Tenderness to superior lateral malleolus and lateral calcaneus tubercle  LOWER EXTREMITY ROM:  Active ROM Right eval Left eval  Hip flexion    Hip extension    Hip abduction  Hip adduction    Hip internal rotation    Hip external rotation    Knee flexion    Knee extension    Ankle dorsiflexion    Ankle plantarflexion    Ankle inversion    Ankle eversion     (Blank rows = not tested)  ! Indicates pain with testing  LOWER EXTREMITY MMT:  MMT Right eval Left eval  Hip flexion    Hip extension    Hip abduction    Hip adduction    Hip internal rotation    Hip external rotation    Knee flexion    Knee extension    Ankle dorsiflexion    Ankle plantarflexion    Ankle inversion    Ankle eversion     (Blank rows = not tested)  ! Indicates pain with testing  LOWER EXTREMITY SPECIAL TESTS:  Ankle special tests: Anterior drawer test: negative, Great toe extension test: negative, and Dorsiflexion-Eversion test: positive   FUNCTIONAL TESTS:  Timed up and go (TUG): 15s  GAIT: Distance walked: 163ft Assistive device utilized: None Level of assistance: SBA Comments: unsteady  gait, antalgic's RLE gait, R weight shift                                                                                                                                TREATMENT: [facility] Adult PT Treatment:                                                DATE: 06/20/2024 Therapeutic Exercise: Slant board gastroc stretch 2x1' Slant board on 6 box soleus stretch 2x1' Therapeutic Activity: NuStep Lvl 7 8' B heel raise on slant board, 2x15, hold 4s in 10d of PF Runners step 6 2x12, hold 1s Squat on airex 2x12, UE a. PRN   [facility] Adult PT Treatment:                                                DATE: 06/15/2024  Therapeutic Exercise: Slant board gastroc stretch 2x1' Slant board on 6 box soleus stretch 2x1' Therapeutic Activity: NuStep Lvl 5 8' B heel raise on slant board, 2x12, hold 4s in 10d of PF Runners step 6 2x12,  Split stance on balance lead RLE 2x2', manual pertubation's to disc. Pt EC on 2nd set    [facility] Adult PT Treatment:                                                DATE: 06/13/24 (Treatment performed outside of [facility])  Therapeutic Exercise: Nustep L4 8 min Neuromuscular re-ed: Heel raise over 4 in step 15x Runners step 4 in 10/10 5# KB placement on 4 in step R for DF strength Rearfoot balance on airex R 60s Therapeutic Activity: PF stretch with towel 60s x2 4 way ankle RTB 15x2 ea.     PATIENT EDUCATION:  Education details: Pt received education regarding HEP performance, ADL performance, functional activity tolerance, impairment education, appropriate performance of therapeutic activities. Person educated: Patient Education method: Explanation, Demonstration, Tactile cues, Verbal cues, and Handouts Education comprehension: verbalized understanding and returned demonstration  HOME EXERCISE PROGRAM: Access Code: [email or web address] URL: [email or web address] Date: 05/25/2024 Prepared by: Mabel Kiang  Exercises - Supine Ankle Circles  - 1 x  daily - 7 x weekly - 2 sets - 1 reps - 28m hold - Seated Toe Raise  - 1 x daily - 5-7 x weekly - 3 sets - 10 reps - 2 hold - Seated Heel Raise  - 1 x daily - 5-7 x weekly - 3 sets - 10 reps - 2s hold - Ankle Inversion Eversion Towel Slide  - 1 x daily - 5-7 x weekly - 2-3 sets - 10 reps - 2s hold ---------------------------------------------------------------------------------------------  ASSESSMENT:  CLINICAL IMPRESSION:  Pt attended physical therapy session for continuation of treatment regarding R ankle pain. Today's treatment focused on improvement of  R ankle stability, WB tolerance, and intrinsic foot strengthening. Pt is demonstrating progress with daily functional ability by reporting less reliance on ankle brace with no reports of pain.  Pt showed great tolerance to administered treatment with no adverse effects by the end of session. Skilled intervention was utilized via activity modification for pt tolerance with task completion, functional progression/regression promoting best outcomes inline with current rehab goals, as well as minimal verbal/tactile cuing alongside no physical assistance for safe and appropriate performance of today's activities. Continue to progress with this therapeutic focus, promote less reliance on brace and education for ambulation quality outside of clinic.   Eval impression (05/25/2024): Pt. attended today's physical therapy session for evaluation of R ankle pain. Pt has complaints of posterior lateral ankle pain that gets up to 8/10 onset pat year. 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 Pt would benefit from therapeutic focus on R ankle stability, strengthening, and ambulation quality.  Treatment performed today focused on pt education detailed in objective. Pt demonstrated good understanding of education provided. required minimal verbal/tactile  cues and stand by assistance for appropriate performance with today's activities. Pt requires the intervention of skilled outpatient physical therapy to address the aforementioned deficits and progress towards a functional level in line with therapeutic goals.   OBJECTIVE IMPAIRMENTS: Abnormal gait, decreased activity tolerance, decreased balance, decreased knowledge of condition, decreased mobility, difficulty walking, decreased ROM, decreased strength, increased edema, improper body mechanics, and pain.   ACTIVITY LIMITATIONS: standing, squatting, stairs, and locomotion level  PARTICIPATION LIMITATIONS: interpersonal relationship, driving, shopping, and community activity  PERSONAL FACTORS: Fitness, Past/current experiences, Time since onset of injury/illness/exacerbation, and 1 comorbidity: R TKA within past year are also affecting patient's functional outcome.   REHAB POTENTIAL: Good  CLINICAL DECISION MAKING: Stable/uncomplicated  EVALUATION COMPLEXITY: Low   GOALS: Goals reviewed with patient? YES  SHORT TERM GOALS: Target date: 06/15/2024 Pt will be independent with administered HEP to demonstrate the competency necessary for long term managemnet of symptoms at home.  Baseline: Goal status: INITIAL   LONG TERM  GOALS: Target date: 07/06/2024  Pt. Will achieve a LEFS score of 47/80 as to demonstrate improvement in self-perceived functional ability with daily activities.  Baseline:  Goal status: INITIAL  2.  Pt will improve Global ankle strength to a 4+/5 to demonstrate improvement in strength for quality of motion and activity performance.  Baseline:  Goal status: INITIAL  3.  Pt will report the ability to stand for >/= 30 minutes as to demonstrate improved tolerance to standing for prolonged time and improved ability to participate in ADLs.  Baseline: 5 minutes Goal status: INITIAL  4.   Pt will independently ambulate 102ft with LRAD and less than 2/10 pain to  demonstrate improved weightbearing tolerance, BLE strength, and functional capacity for community ambulation.  Baseline: 18ft 8/10 Goal status: INITIAL --------------------------------------------------------------------------------------------- PLAN:  PT FREQUENCY: 1-2x/week  PT DURATION: 6 weeks  PLANNED INTERVENTIONS: 97110-Therapeutic exercises, 97530- Therapeutic activity, 97112- Neuromuscular re-education, 97535- Self Care, [id number]- Manual therapy, [phone]- Gait training, [phone]- Electrical stimulation (manual), Patient/Family education, Balance training, Stair training, Taping, Joint mobilization, and DME instructions  PLAN FOR NEXT SESSION: Continue to progress with this therapeutic focus, promote less reliance on brace and education for ambulation quality outside of clinic.   Mabel Kiang, PT, DPT 06/20/2024, 12:11 PM   For all possible CPT codes, reference the Planned Interventions line above.     Check all conditions that are expected to impact treatment: {Conditions expected to impact treatment:None of these apply   If treatment provided at initial evaluation, no treatment charged due to lack of authorization.

## 2024-06-20 NOTE — Progress Notes (Unsigned)
 Pain Scale   Average Pain 0 Patient advising she has lower back pain that in centralized without radiating anywhere. Patient advises when she is standing her pain increases and she she moves to another position her pain decreases.         +Driver, -BT, -Dye Allergies.

## 2024-06-20 NOTE — Progress Notes (Unsigned)
 [name] - 63 y.o. female MRN [id number]  Date of birth: 06/27/1961  Office Visit Note: Visit Date: 06/20/2024 PCP: Nicholas Bar, MD Referred by: Nicholas Bar, MD  Subjective: Chief Complaint  Patient presents with   Lower Back - Pain   HPI: Sandra Lozano is a 63 y.o. female who comes in today for evaluation of chronic, worsening and severe bilateral lower back pain radiating to right lateral hip. Patient is well known to us . Pain ongoing for several years, worsens with walking and activity. She describes pain as sharp sensation, currently rates as 8 out of 10. Recent lumbar MRI imaging shows widely patent spinal canal at L3-L4 and L4-L5 following interval posterior decompression. There is new mild to moderate bilateral foraminal stenosis at L3-L4. History of L4-L5 lumbar fusion by Dr. Oneil Herald in June 2020. She has done well with infrequent transforaminal injections. Most recent was right L4 transforaminal epidural steroid injection performed in our office on 12/02/2023. She reports greater than 80% relief of pain for over 4 months with this procedure. Also reports increased functional ability post injection. States her symptoms have remained the same over the years, feels her issue today is same problem. Patient denies focal weakness, numbness and tingling. No recent trauma or falls.      Review of Systems  Musculoskeletal:  Positive for back pain.  Neurological:  Negative for tingling, sensory change, focal weakness and weakness.  All other systems reviewed and are negative.  Otherwise per HPI.  Assessment & Plan: Visit Diagnoses:    ICD-10-CM   1. Lumbar radiculopathy  M54.16 Ambulatory referral to Physical Medicine Rehab    2. Chronic bilateral low back pain with right-sided sciatica  G89.29 Ambulatory referral to Physical Medicine Rehab   M54.41     3. Post laminectomy syndrome  M96.1 Ambulatory referral to Physical Medicine Rehab       Plan: Findings:  Chronic,  worsening and severe bilateral lower back pain radiating to right lateral hip region.  Patient continues to have severe pain despite good conservative therapy such as home exercise regimen, rest and use of medications.  Patient's clinical presentation and exam are consistent with lumbar radiculopathy.  I discussed her recent lumbar MRI with her today using imaging and spine model.  There is new findings of mild to moderate bilateral foraminal stenosis at the level of L3-L4.  No high-grade spinal canal stenosis noted.  We discussed treatment plan in detail today.  She has done well with infrequent lumbar epidural steroid injections in the past.  I placed order for diagnostic and hopefully therapeutic right L4 transforaminal epidural steroid injection under fluoroscopic guidance.  If good pain relief  with injection we can repeat this procedure infrequently as needed.  She has no questions regarding injection procedure at this time.  We will get her in as quickly as possible for injection.  No red flag symptoms noted upon exam today.    Meds & Orders: No orders of the defined types were placed in this encounter.   Orders Placed This Encounter  Procedures   Ambulatory referral to Physical Medicine Rehab    Follow-up: Return for Right L4 transforaminal epidural steroid injection.   Procedures: No procedures performed      Clinical History: CLINICAL DATA:  Low back pain, symptoms persist with > 6 wks treatment. Worsening chronic right hip pain. History of lumbar fusion.   EXAM: MRI LUMBAR SPINE WITHOUT CONTRAST   TECHNIQUE: Multiplanar, multisequence MR imaging of the lumbar  spine was performed. No intravenous contrast was administered.   COMPARISON:  Lumbar spine MRI 11/13/2018   FINDINGS: Segmentation:  Standard.   Alignment: Grade 1 anterolisthesis of L4 on L5, reduced following interval fusion. Slight right convex curvature of the lumbar spine.   Vertebrae: No fracture, suspicious  marrow lesion, or significant marrow edema. Interval L4-5 PLIF with evidence of solid arthrodesis.   Conus medullaris and cauda equina: Conus extends to the L1-2 level. Conus and cauda equina appear normal.   Paraspinal and other soft tissues: Postoperative changes in the posterior lower lumbar soft tissues. No fluid collection.   Disc levels:   T12-L1: Mild facet and ligamentum flavum hypertrophy without disc herniation or stenosis.   L1-2: Mild facet and ligamentum flavum hypertrophy without disc herniation or stenosis.   L2-3: Increased, mild disc bulging and moderate facet and ligamentum flavum hypertrophy result in borderline to mild bilateral neural foraminal stenosis without spinal stenosis.   L3-4: Interval laminectomies. Increased disc bulging and moderate to severe facet hypertrophy result in new mild to moderate bilateral neural foraminal stenosis without spinal stenosis.   L4-5: Interval wide posterior decompression and fusion. Widely patent spinal canal and right neural foramen. Suboptimal assessment of the left neural foramen although no residual compressive stenosis is suspected status post facetectomy.   L5-S1: Minimal disc bulging and moderate facet and ligamentum flavum hypertrophy have progressed and result in borderline to mild bilateral neural foraminal stenosis without spinal stenosis.   IMPRESSION: 1. Widely patent spinal canal at L3-4 and L4-5 following interval posterior decompression. 2. New mild to moderate bilateral neural foraminal stenosis at L3-4. 3. Borderline to mild neural foraminal stenosis at L2-3 and L5-S1.     Electronically Signed   By: Dasie Hamburg M.D.   On: 05/29/2024 10:23   She reports that she quit smoking about 9 years ago. Her smoking use included cigarettes. She started smoking about 39 years ago. She has a 30 pack-year smoking history. She has been exposed to tobacco smoke. She has never used smokeless tobacco.  Recent  Labs    07/01/23 0906 09/08/23 1548 12/10/23 0940 04/04/24 1153  HGBA1C 5.5  --  5.8 5.5  LABURIC  --  5.6  --   --     Objective:  VS:  HT:    WT:   BMI:     BP:   HR: bpm  TEMP: ( )  RESP:  Physical Exam Vitals and nursing note reviewed.  HENT:     Head: Normocephalic and atraumatic.     Right Ear: External ear normal.     Left Ear: External ear normal.     Nose: Nose normal.     Mouth/Throat:     Mouth: Mucous membranes are moist.  Eyes:     Extraocular Movements: Extraocular movements intact.  Cardiovascular:     Rate and Rhythm: Normal rate.     Pulses: Normal pulses.  Pulmonary:     Effort: Pulmonary effort is normal.  Abdominal:     General: Abdomen is flat. There is no distension.  Musculoskeletal:        General: Tenderness present.     Cervical back: Normal range of motion.     Comments: Patient rises from seated position to standing without difficulty. Good lumbar range of motion. No pain noted with facet loading. 5/5 strength noted with bilateral hip flexion, knee flexion/extension, ankle dorsiflexion/plantarflexion and EHL. No clonus noted bilaterally. No pain upon palpation of greater trochanters. No pain with  internal/external rotation of bilateral hips. Sensation intact bilaterally. Negative slump test bilaterally. Ambulates without aid, gait steady.     Skin:    General: Skin is warm and dry.     Capillary Refill: Capillary refill takes less than 2 seconds.  Neurological:     General: No focal deficit present.     Mental Status: She is alert and oriented to person, place, and time.  Psychiatric:        Mood and Affect: Mood normal.        Behavior: Behavior normal.     Ortho Exam  Imaging: No results found.  Past Medical/Family/Surgical/Social History: Medications & Allergies reviewed per EMR, new medications updated. Patient Active Problem List   Diagnosis Date Noted   Chronic pain of right ankle 02/01/2024   Benign lichenoid keratosis  12/24/2023   Bruising 12/24/2023   Unilateral primary osteoarthritis, right knee 08/21/2023   Total knee replacement status, right 08/21/2023   Hidradenitis suppurativa of right axilla 06/03/2023   Vaginal irritation 02/21/2023   Tinea 12/18/2022   Left shoulder pain 08/25/2022   DJD (degenerative joint disease), multiple sites 08/06/2022   COPD, mild (HCC) 02/11/2022   Carpal tunnel syndrome, bilateral 12/21/2021   Arthritis of finger of left hand 11/26/2021   Traumatic arthritis of left foot    Lichen simplex chronicus 06/04/2020   Arthritis of midfoot 03/14/2020   History of lumbar fusion 03/14/2020   Well controlled diabetes mellitus (HCC) 11/07/2019   Nummular eczema 07/06/2019   Lumbar stenosis 05/20/2019   Acute left-sided low back pain without sciatica 07/18/2018   Osteoarthritis of right hip 06/03/2018   Chronic right shoulder pain 05/05/2018   Essential hypertension 02/15/2018   Past Medical History:  Diagnosis Date   Arthritis    right knee, lower back   Chest wall pain 04/20/2021   Dyspnea    very rare -tx with albuterol  neb sol if needed   Hyperlipidemia    Hypertension    Intertrigo 08/06/2022   Pre-diabetes    per patient - does not check blood sugar   Right hip pain 05/05/2018   Rupture of anterior cruciate ligament of right knee 02/15/2018   Sprain of medial collateral ligament of right knee 02/15/2018   SVD (spontaneous vaginal delivery)    x 5 - only 2 living, 1 stillborn and 1 premature at 7 months demise,1 child deceased   Wears dentures    upper and lower   Family History  Problem Relation Age of Onset   Cancer Sister    Healthy Daughter    Healthy Daughter    Past Surgical History:  Procedure Laterality Date   FOOT ARTHRODESIS Left 11/02/2020   Procedure: LEFT MIDFOOT FUSION BASE 1ST AND 2ND METATARSAL;  Surgeon: Harden Jerona GAILS, MD;  Location: [facility] OR;  Service: Orthopedics;  Laterality: Left;   LUMBAR FUSION  05/20/2019   GILL PROCEDURE,  LEFT TRANSFORAMINAL LUMBAR INTERBODY FUSION, PEDICLE INSTRUMENTATION, BILATERAL FUSION (N/A   TOTAL KNEE ARTHROPLASTY Right 08/21/2023   Procedure: RIGHT TOTAL KNEE ARTHROPLASTY;  Surgeon: Harden Jerona GAILS, MD;  Location: [facility] OR;  Service: Orthopedics;  Laterality: Right;   TUBAL LIGATION     Social History   Occupational History   Not on file  Tobacco Use   Smoking status: Former    Current packs/day: 0.00    Average packs/day: 1 pack/day for 30.0 years (30.0 ttl pk-yrs)    Types: Cigarettes    Start date: 01/1985    Quit date:  01/2015    Years since quitting: 9.4    Passive exposure: Past   Smokeless tobacco: Never  Vaping Use   Vaping status: Never Used  Substance and Sexual Activity   Alcohol use: Yes    Comment: occ   Drug use: Yes    Types: Marijuana   Sexual activity: Not on file

## 2024-06-20 NOTE — Therapy (Unsigned)
 OUTPATIENT PHYSICAL THERAPY LOWER TREATMENT/DISCHARGE SUMMARY  Patient Name: Sandra Lozano MRN: [id number] DOB:1961-03-14, 63 y.o., female Today's Date: 06/22/2024  END OF SESSION:  PT End of Session - 06/22/24 1219     Visit Number 7    Number of Visits 13    Date for PT Re-Evaluation 07/06/24    Authorization Type MCD    Authorization Time Period Approved 8 visits starting 05/25/24-07/24/24    Authorization - Visit Number 7    Authorization - Number of Visits 8    PT Start Time 1220    PT Stop Time 1245    PT Time Calculation (min) 25 min    Activity Tolerance Patient tolerated treatment well    Behavior During Therapy [facility] for tasks assessed/performed               Past Medical History:  Diagnosis Date   Arthritis    right knee, lower back   Chest wall pain 04/20/2021   Dyspnea    very rare -tx with albuterol  neb sol if needed   Hyperlipidemia    Hypertension    Intertrigo 08/06/2022   Pre-diabetes    per patient - does not check blood sugar   Right hip pain 05/05/2018   Rupture of anterior cruciate ligament of right knee 02/15/2018   Sprain of medial collateral ligament of right knee 02/15/2018   SVD (spontaneous vaginal delivery)    x 5 - only 2 living, 1 stillborn and 1 premature at 7 months demise,1 child deceased   Wears dentures    upper and lower   Past Surgical History:  Procedure Laterality Date   FOOT ARTHRODESIS Left 11/02/2020   Procedure: LEFT MIDFOOT FUSION BASE 1ST AND 2ND METATARSAL;  Surgeon: Harden Jerona GAILS, MD;  Location: [facility] OR;  Service: Orthopedics;  Laterality: Left;   LUMBAR FUSION  05/20/2019   GILL PROCEDURE, LEFT TRANSFORAMINAL LUMBAR INTERBODY FUSION, PEDICLE INSTRUMENTATION, BILATERAL FUSION (N/A   TOTAL KNEE ARTHROPLASTY Right 08/21/2023   Procedure: RIGHT TOTAL KNEE ARTHROPLASTY;  Surgeon: Harden Jerona GAILS, MD;  Location: [facility] OR;  Service: Orthopedics;  Laterality: Right;   TUBAL LIGATION     Patient Active Problem List    Diagnosis Date Noted   Chronic pain of right ankle 02/01/2024   Benign lichenoid keratosis 12/24/2023   Bruising 12/24/2023   Unilateral primary osteoarthritis, right knee 08/21/2023   Total knee replacement status, right 08/21/2023   Hidradenitis suppurativa of right axilla 06/03/2023   Vaginal irritation 02/21/2023   Tinea 12/18/2022   Left shoulder pain 08/25/2022   DJD (degenerative joint disease), multiple sites 08/06/2022   COPD, mild (HCC) 02/11/2022   Carpal tunnel syndrome, bilateral 12/21/2021   Arthritis of finger of left hand 11/26/2021   Traumatic arthritis of left foot    Lichen simplex chronicus 06/04/2020   Arthritis of midfoot 03/14/2020   History of lumbar fusion 03/14/2020   Well controlled diabetes mellitus (HCC) 11/07/2019   Nummular eczema 07/06/2019   Lumbar stenosis 05/20/2019   Acute left-sided low back pain without sciatica 07/18/2018   Osteoarthritis of right hip 06/03/2018   Chronic right shoulder pain 05/05/2018   Essential hypertension 02/15/2018    PCP: Nicholas Bar, MD  REFERRING PROVIDER:   Madelon Donald CHRISTELLA, DO    REFERRING DIAG: [phone] (ICD-10-CM) - Chronic pain of right ankle   Rationale for Evaluation and Treatment: Rehabilitation  THERAPY DIAG:  Stiffness of right ankle, not elsewhere classified  Pain in right ankle and joints of  right foot  Other abnormalities of gait and mobility  PERTINENT HISTORY: HTN, COPD, R TKA  WEIGHT BEARING RESTRICTIONS: No  FALLS:  Has patient fallen in last 6 months? No  LIVING ENVIRONMENT: Lives with: lives with their family Lives in: House/apartment Stairs: Yes: Internal: 12 steps; on right going up Has following equipment at home: None  OCCUPATION: not currently working   PRECAUTIONS: None ---------------------------------------------------------------------------------------------  SUBJECTIVE:   SUBJECTIVE STATEMENT: Continues to do well and and feels she can DC to HEP  today.  Has been extending time w/o the brace and was able to shop and exercise outside of brace.    Eval statement 05/25/2024: has been experiencing R ankle pain onset a year, had a cortisone shot that didn't help. Only has relief from lidocaine  patch, works out every morning. Exercise does ease it a bit, but has to wear a compression sock otherwise her ankle will bother her.  Currently 8/10,  had a R TKA in 2024  RED FLAGS: None   PLOF: Independent  PATIENT GOALS: learn how to walk  NEXT MD VISIT: TBD ---------------------------------------------------------------------------------------------  OBJECTIVE:  Note: Objective measures were completed at Evaluation unless otherwise noted.  DIAGNOSTIC FINDINGS: IMPRESSION: 1. Mild edema within the flexor digitorum brevis muscle just deep to the medial band of the plantar fascia approximately 2.4 cm distal from the plantar fascia origin. Otherwise, the plantar fascia origins are intact. There is a mild-to-moderate plantar calcaneal heel spur. 2. Mild-to-moderate chronic cystic change with mild surrounding marrow edema within the talus at the deltoid ligament insertion. This appears to be the sequela of chronic traction changes at the deltoid ligament insertion. 3. 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 4. Mild tibiotalar and moderate posterior aspect of the posterior subtalar joint cartilage thinning.     Electronically Signed   By: Tanda Lyons M.D.   On: 12/31/2023 12:08  PATIENT SURVEYS:  LEFS  Extreme difficulty/unable (0), Quite a bit of difficulty (1), Moderate difficulty (2), Little difficulty (3), No difficulty (4) Survey date:    Any of your usual work, housework or school activities   2. Usual hobbies, recreational or sporting activities   3. Getting into/out of the bath   4. Walking between rooms   5. Putting on socks/shoes   6. Squatting    7. Lifting an object, like a bag of groceries from the floor   8.  Performing light activities around your home   9. Performing heavy activities around your home   10. Getting into/out of a car   11. Walking 2 blocks   12. Walking 1 mile   13. Going up/down 10 stairs (1 flight)   14. Standing for 1 hour   15.  sitting for 1 hour   16. Running on even ground   17. Running on uneven ground   18. Making sharp turns while running fast   19. Hopping    20. Rolling over in bed   Score total:  35/80  06/22/24 65/80  COGNITION: Overall cognitive status: Within functional limits for tasks assessed     SENSATION: WFL  EDEMA:  Trace swelling around Lateral L malleolus  MUSCLE LENGTH: Gastroc limited  POSTURE: No Significant postural limitations  PALPATION: Tenderness to superior lateral malleolus and lateral calcaneus tubercle  LOWER EXTREMITY ROM:  Active ROM Right eval Left eval  Hip flexion    Hip extension    Hip abduction    Hip adduction    Hip internal rotation    Hip external rotation  Knee flexion    Knee extension    Ankle dorsiflexion    Ankle plantarflexion    Ankle inversion    Ankle eversion     (Blank rows = not tested)  ! Indicates pain with testing  LOWER EXTREMITY MMT:  MMT Right eval Left eval  Hip flexion    Hip extension    Hip abduction    Hip adduction    Hip internal rotation    Hip external rotation    Knee flexion    Knee extension    Ankle dorsiflexion    Ankle plantarflexion    Ankle inversion    Ankle eversion     (Blank rows = not tested)  ! Indicates pain with testing  LOWER EXTREMITY SPECIAL TESTS:  Ankle special tests: Anterior drawer test: negative, Great toe extension test: negative, and Dorsiflexion-Eversion test: positive   FUNCTIONAL TESTS:  Timed up and go (TUG): 15s  GAIT: Distance walked: 136ft Assistive device utilized: None Level of assistance: SBA Comments: unsteady gait, antalgic's RLE gait, R weight shift                                                                                                                                 TREATMENT: [facility] Adult PT Treatment:                                                DATE: 06/22/24 Therapeutic Activity: HEP review and update including gait training LEFS retake Assessment of goals  [facility] Adult PT Treatment:                                                DATE: 06/20/2024 Therapeutic Exercise: Slant board gastroc stretch 2x1' Slant board on 6 box soleus stretch 2x1' Therapeutic Activity: NuStep Lvl 7 8' B heel raise on slant board, 2x15, hold 4s in 10d of PF Runners step 6 2x12, hold 1s Squat on airex 2x12, UE a. PRN   [facility] Adult PT Treatment:                                                DATE: 06/15/2024  Therapeutic Exercise: Slant board gastroc stretch 2x1' Slant board on 6 box soleus stretch 2x1' Therapeutic Activity: NuStep Lvl 5 8' B heel raise on slant board, 2x12, hold 4s in 10d of PF Runners step 6 2x12,  Split stance on balance lead RLE 2x2', manual pertubation's to disc. Pt EC on 2nd set    [facility] Adult PT Treatment:  DATE: 06/13/24 (Treatment performed outside of [facility]) Therapeutic Exercise: Nustep L4 8 min Neuromuscular re-ed: Heel raise over 4 in step 15x Runners step 4 in 10/10 5# KB placement on 4 in step R for DF strength Rearfoot balance on airex R 60s Therapeutic Activity: PF stretch with towel 60s x2 4 way ankle RTB 15x2 ea.     PATIENT EDUCATION:  Education details: Pt received education regarding HEP performance, ADL performance, functional activity tolerance, impairment education, appropriate performance of therapeutic activities. Person educated: Patient Education method: Explanation, Demonstration, Tactile cues, Verbal cues, and Handouts Education comprehension: verbalized understanding and returned demonstration  HOME EXERCISE PROGRAM: Access Code: [email or web address] URL:  [email or web address] Date: 05/25/2024 Prepared by: Mabel Kiang  Exercises - Supine Ankle Circles  - 1 x daily - 7 x weekly - 2 sets - 1 reps - 79m hold - Seated Toe Raise  - 1 x daily - 5-7 x weekly - 3 sets - 10 reps - 2 hold - Seated Heel Raise  - 1 x daily - 5-7 x weekly - 3 sets - 10 reps - 2s hold - Ankle Inversion Eversion Towel Slide  - 1 x daily - 5-7 x weekly - 2-3 sets - 10 reps - 2s hold ---------------------------------------------------------------------------------------------  ASSESSMENT:  CLINICAL IMPRESSION: Rehab goals met, patient confident to DC to updated HEP   Eval impression (05/25/2024): Pt. attended today's physical therapy session for evaluation of R ankle pain. Pt has complaints of posterior lateral ankle pain that gets up to 8/10 onset pat year. 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 Pt would benefit from therapeutic focus on R ankle stability, strengthening, and ambulation quality.  Treatment performed today focused on pt education detailed in objective. Pt demonstrated good understanding of education provided. required minimal verbal/tactile cues and stand by assistance for appropriate performance with today's activities. Pt requires the intervention of skilled outpatient physical therapy to address the aforementioned deficits and progress towards a functional level in line with therapeutic goals.   OBJECTIVE IMPAIRMENTS: Abnormal gait, decreased activity tolerance, decreased balance, decreased knowledge of condition, decreased mobility, difficulty walking, decreased ROM, decreased strength, increased edema, improper body mechanics, and pain.   ACTIVITY LIMITATIONS: standing, squatting, stairs, and locomotion level  PARTICIPATION LIMITATIONS: interpersonal relationship, driving, shopping, and community activity  PERSONAL  FACTORS: Fitness, Past/current experiences, Time since onset of injury/illness/exacerbation, and 1 comorbidity: R TKA within past year are also affecting patient's functional outcome.   REHAB POTENTIAL: Good  CLINICAL DECISION MAKING: Stable/uncomplicated  EVALUATION COMPLEXITY: Low   GOALS: Goals reviewed with patient? YES  SHORT TERM GOALS: Target date: 06/15/2024 Pt will be independent with administered HEP to demonstrate the competency necessary for long term managemnet of symptoms at home.  Baseline:[id number] Goal status: Met    LONG TERM GOALS: Target date: 07/06/2024  Pt. Will achieve a LEFS score of 47/80 as to demonstrate improvement in self-perceived functional ability with daily activities.  Baseline: 06/22/24 65/80 Goal status: Met  2.  Pt will improve Global ankle strength to a 4+/5 to demonstrate improvement in strength for quality of motion and activity performance.  Baseline:  Goal status: Met as evidenced by ability to SL heel raise and DF   3.  Pt will report the ability to stand for >/= 30 minutes as to demonstrate improved tolerance to standing for prolonged time and improved ability to participate in ADLs.  Baseline: >30 minutes per report Goal  status: Met  4.   Pt will independently ambulate 1032ft with LRAD and less than 2/10 pain to demonstrate improved weightbearing tolerance, BLE strength, and functional capacity for community ambulation.  Baseline: 157ft 8/10 Goal status: Met as she is able to attend gym workouts and shop w/o limitation --------------------------------------------------------------------------------------------- PLAN:  PT FREQUENCY: 1-2x/week  PT DURATION: 6 weeks  PLANNED INTERVENTIONS: 97110-Therapeutic exercises, 97530- Therapeutic activity, 97112- Neuromuscular re-education, 97535- Self Care, [id number]- Manual therapy, [phone]- Gait training, [phone]- Electrical stimulation (manual), Patient/Family education, Balance training, Stair  training, Taping, Joint mobilization, and DME instructions  PLAN FOR NEXT SESSION: Continue to progress with this therapeutic focus, promote less reliance on brace and education for ambulation quality outside of clinic.   Jeff Augusta Hilbert PT  06/22/2024, 1:06 PM   For all possible CPT codes, reference the Planned Interventions line above.     Check all conditions that are expected to impact treatment: {Conditions expected to impact treatment:None of these apply   If treatment provided at initial evaluation, no treatment charged due to lack of authorization.

## 2024-06-20 NOTE — Addendum Note (Signed)
 Addended by: GEORGINA GIOVANNA BROCKS on: 06/20/2024 09:22 AM   Modules accepted: Orders

## 2024-06-22 ENCOUNTER — Ambulatory Visit

## 2024-06-22 DIAGNOSIS — M6281 Muscle weakness (generalized): Secondary | ICD-10-CM | POA: Diagnosis not present

## 2024-06-22 DIAGNOSIS — G8929 Other chronic pain: Secondary | ICD-10-CM | POA: Diagnosis not present

## 2024-06-22 DIAGNOSIS — M25671 Stiffness of right ankle, not elsewhere classified: Secondary | ICD-10-CM

## 2024-06-22 DIAGNOSIS — R2689 Other abnormalities of gait and mobility: Secondary | ICD-10-CM | POA: Diagnosis not present

## 2024-06-22 DIAGNOSIS — M25571 Pain in right ankle and joints of right foot: Secondary | ICD-10-CM | POA: Diagnosis not present

## 2024-06-22 DIAGNOSIS — M25561 Pain in right knee: Secondary | ICD-10-CM | POA: Diagnosis not present

## 2024-06-25 DIAGNOSIS — Z419 Encounter for procedure for purposes other than remedying health state, unspecified: Secondary | ICD-10-CM | POA: Diagnosis not present

## 2024-06-29 ENCOUNTER — Other Ambulatory Visit: Payer: Self-pay | Admitting: Family Medicine

## 2024-06-29 DIAGNOSIS — E119 Type 2 diabetes mellitus without complications: Secondary | ICD-10-CM

## 2024-07-07 ENCOUNTER — Ambulatory Visit (INDEPENDENT_AMBULATORY_CARE_PROVIDER_SITE_OTHER): Admitting: Orthopedic Surgery

## 2024-07-07 DIAGNOSIS — M25571 Pain in right ankle and joints of right foot: Secondary | ICD-10-CM

## 2024-07-17 ENCOUNTER — Encounter: Payer: Self-pay | Admitting: Orthopedic Surgery

## 2024-07-17 NOTE — Progress Notes (Signed)
 Office Visit Note   Patient: Sandra Lozano           Date of Birth: [birth year]           MRN: [id number] Visit Date: 07/07/2024              Requested by: Nicholas Bar, MD [address] [place],  [place] [address] PCP: Nicholas Bar, MD  Chief Complaint  Patient presents with   Right Ankle - Pain      HPI: Patient is a 63 year old woman who presents for pain in both lower extremities.  She is status post right ankle peroneal tendon injection in April she states she has some hypopigmentation from the injection.  She currently ambulates with a cane Assessment & Plan: Visit Diagnoses:  1. Pain in right ankle and joints of right foot     Plan: Recommended increasing activities as tolerated.  No surgical intervention indicated at this time.  Follow-Up Instructions: Return if symptoms worsen or fail to improve.   Ortho Exam  Patient is alert, oriented, no adenopathy, well-dressed, normal affect, normal respiratory effort. .  She is status post Lisfranc fusion on the left.  She has clawing of the toes 2 3 and 4.  She has some hypopigmentation of the skin over the peroneal tendon injection.  Her motor strength is 4/5 in all motor groups of the right ankle.  She had phasic dorsalis pedis and posterior tibial pulses without arterial insufficiency.  The left Lisfranc fusion is stable.  No focal motor weakness on the right.    Imaging: No results found. No images are attached to the encounter.  Labs: Lab Results  Component Value Date   HGBA1C 5.5 04/04/2024   HGBA1C 5.8 12/10/2023   HGBA1C 5.5 07/01/2023   ESRSEDRATE 11 11/26/2021   LABURIC 5.6 09/08/2023   LABURIC 5.2 11/26/2021     Lab Results  Component Value Date   ALBUMIN  4.0 05/18/2019    No results found for: MG No results found for: VD25OH  No results found for: PREALBUMIN    Latest Ref Rng & Units 08/13/2023    9:10 AM 07/01/2023    9:06 AM 11/02/2020    8:05 AM  CBC EXTENDED  WBC 4.0 - 10.5 K/uL  6.0  6.1    RBC 3.87 - 5.11 MIL/uL 4.57  4.38    Hemoglobin 12.0 - 15.0 g/dL 86.8  87.6  85.3   HCT 36.0 - 46.0 % 40.7  38.5  43.0   Platelets 150 - 400 K/uL 272  296       There is no height or weight on file to calculate BMI.  Orders:  No orders of the defined types were placed in this encounter.  No orders of the defined types were placed in this encounter.    Procedures: No procedures performed  Clinical Data: No additional findings.  ROS:  All other systems negative, except as noted in the HPI. Review of Systems  Objective: Vital Signs: LMP  (LMP Unknown)   Specialty Comments:  CLINICAL DATA:  Low back pain, symptoms persist with > 6 wks treatment. Worsening chronic right hip pain. History of lumbar fusion.   EXAM: MRI LUMBAR SPINE WITHOUT CONTRAST   TECHNIQUE: Multiplanar, multisequence MR imaging of the lumbar spine was performed. No intravenous contrast was administered.   COMPARISON:  Lumbar spine MRI 11/13/2018   FINDINGS: Segmentation:  Standard.   Alignment: Grade 1 anterolisthesis of L4 on L5, reduced following interval fusion.  Slight right convex curvature of the lumbar spine.   Vertebrae: No fracture, suspicious marrow lesion, or significant marrow edema. Interval L4-5 PLIF with evidence of solid arthrodesis.   Conus medullaris and cauda equina: Conus extends to the L1-2 level. Conus and cauda equina appear normal.   Paraspinal and other soft tissues: Postoperative changes in the posterior lower lumbar soft tissues. No fluid collection.   Disc levels:   T12-L1: Mild facet and ligamentum flavum hypertrophy without disc herniation or stenosis.   L1-2: Mild facet and ligamentum flavum hypertrophy without disc herniation or stenosis.   L2-3: Increased, mild disc bulging and moderate facet and ligamentum flavum hypertrophy result in borderline to mild bilateral neural foraminal stenosis without spinal stenosis.   L3-4: Interval  laminectomies. Increased disc bulging and moderate to severe facet hypertrophy result in new mild to moderate bilateral neural foraminal stenosis without spinal stenosis.   L4-5: Interval wide posterior decompression and fusion. Widely patent spinal canal and right neural foramen. Suboptimal assessment of the left neural foramen although no residual compressive stenosis is suspected status post facetectomy.   L5-S1: Minimal disc bulging and moderate facet and ligamentum flavum hypertrophy have progressed and result in borderline to mild bilateral neural foraminal stenosis without spinal stenosis.   IMPRESSION: 1. Widely patent spinal canal at L3-4 and L4-5 following interval posterior decompression. 2. New mild to moderate bilateral neural foraminal stenosis at L3-4. 3. Borderline to mild neural foraminal stenosis at L2-3 and L5-S1.     Electronically Signed   By: Dasie Hamburg M.D.   On: 05/29/2024 10:23  PMFS History: Patient Active Problem List   Diagnosis Date Noted   Chronic pain of right ankle 02/01/2024   Benign lichenoid keratosis 12/24/2023   Bruising 12/24/2023   Unilateral primary osteoarthritis, right knee 08/21/2023   Total knee replacement status, right 08/21/2023   Hidradenitis suppurativa of right axilla 06/03/2023   Vaginal irritation 02/21/2023   Tinea 12/18/2022   Left shoulder pain 08/25/2022   DJD (degenerative joint disease), multiple sites 08/06/2022   COPD, mild (HCC) 02/11/2022   Carpal tunnel syndrome, bilateral 12/21/2021   Arthritis of finger of left hand 11/26/2021   Traumatic arthritis of left foot    Lichen simplex chronicus 06/04/2020   Arthritis of midfoot 03/14/2020   History of lumbar fusion 03/14/2020   Well controlled diabetes mellitus (HCC) 11/07/2019   Nummular eczema 07/06/2019   Lumbar stenosis 05/20/2019   Acute left-sided low back pain without sciatica 07/18/2018   Osteoarthritis of right hip 06/03/2018   Chronic right  shoulder pain 05/05/2018   Essential hypertension 02/15/2018   Past Medical History:  Diagnosis Date   Arthritis    right knee, lower back   Chest wall pain 04/20/2021   Dyspnea    very rare -tx with albuterol  neb sol if needed   Hyperlipidemia    Hypertension    Intertrigo 08/06/2022   Pre-diabetes    per patient - does not check blood sugar   Right hip pain 05/05/2018   Rupture of anterior cruciate ligament of right knee 02/15/2018   Sprain of medial collateral ligament of right knee 02/15/2018   SVD (spontaneous vaginal delivery)    x 5 - only 2 living, 1 stillborn and 1 premature at 7 months demise,1 child deceased   Wears dentures    upper and lower    Family History  Problem Relation Age of Onset   Cancer Sister    Healthy Daughter    Healthy Daughter  Past Surgical History:  Procedure Laterality Date   FOOT ARTHRODESIS Left 11/02/2020   Procedure: LEFT MIDFOOT FUSION BASE 1ST AND 2ND METATARSAL;  Surgeon: Harden Jerona GAILS, MD;  Location: [facility] OR;  Service: Orthopedics;  Laterality: Left;   LUMBAR FUSION  05/20/2019   GILL PROCEDURE, LEFT TRANSFORAMINAL LUMBAR INTERBODY FUSION, PEDICLE INSTRUMENTATION, BILATERAL FUSION (N/A   TOTAL KNEE ARTHROPLASTY Right 08/21/2023   Procedure: RIGHT TOTAL KNEE ARTHROPLASTY;  Surgeon: Harden Jerona GAILS, MD;  Location: [facility] OR;  Service: Orthopedics;  Laterality: Right;   TUBAL LIGATION     Social History   Occupational History   Not on file  Tobacco Use   Smoking status: Former    Current packs/day: 0.00    Average packs/day: 1 pack/day for 30.0 years (30.0 ttl pk-yrs)    Types: Cigarettes    Start date: 01/1985    Quit date: 01/2015    Years since quitting: 9.5    Passive exposure: Past   Smokeless tobacco: Never  Vaping Use   Vaping status: Never Used  Substance and Sexual Activity   Alcohol use: Yes    Comment: occ   Drug use: Yes    Types: Marijuana   Sexual activity: Not on file

## 2024-07-18 ENCOUNTER — Other Ambulatory Visit: Payer: Self-pay | Admitting: Family Medicine

## 2024-07-18 DIAGNOSIS — E119 Type 2 diabetes mellitus without complications: Secondary | ICD-10-CM

## 2024-07-21 ENCOUNTER — Other Ambulatory Visit: Payer: Self-pay | Admitting: *Deleted

## 2024-07-21 ENCOUNTER — Ambulatory Visit (INDEPENDENT_AMBULATORY_CARE_PROVIDER_SITE_OTHER): Admitting: Physical Medicine and Rehabilitation

## 2024-07-21 ENCOUNTER — Other Ambulatory Visit: Payer: Self-pay

## 2024-07-21 VITALS — BP 148/81 | HR 62

## 2024-07-21 DIAGNOSIS — M5416 Radiculopathy, lumbar region: Secondary | ICD-10-CM | POA: Diagnosis not present

## 2024-07-21 DIAGNOSIS — G5601 Carpal tunnel syndrome, right upper limb: Secondary | ICD-10-CM

## 2024-07-21 MED ORDER — GABAPENTIN 600 MG PO TABS: 600.0000 mg | ORAL_TABLET | Freq: Every day | ORAL | 0 refills | Status: DC

## 2024-07-21 MED ORDER — METHYLPREDNISOLONE ACETATE 40 MG/ML IJ SUSP
40.0000 mg | Freq: Once | INTRAMUSCULAR | Status: AC
  Administered 2024-07-21: 40 mg

## 2024-07-21 NOTE — Patient Instructions (Signed)

## 2024-07-21 NOTE — Progress Notes (Signed)
 Pain Scale   Average Pain 7 Patient advising she has chronic lower back pain that get worse when sitting or standing with poor posture, patient advising her pain gets better when she strengthens her back        +Driver, -BT, -Dye Allergies.

## 2024-07-25 ENCOUNTER — Other Ambulatory Visit: Payer: Self-pay | Admitting: Family Medicine

## 2024-07-25 DIAGNOSIS — G5603 Carpal tunnel syndrome, bilateral upper limbs: Secondary | ICD-10-CM | POA: Diagnosis not present

## 2024-07-25 DIAGNOSIS — G8929 Other chronic pain: Secondary | ICD-10-CM

## 2024-07-26 ENCOUNTER — Other Ambulatory Visit: Payer: Self-pay | Admitting: Family Medicine

## 2024-07-26 DIAGNOSIS — Z419 Encounter for procedure for purposes other than remedying health state, unspecified: Secondary | ICD-10-CM | POA: Diagnosis not present

## 2024-07-26 DIAGNOSIS — G8929 Other chronic pain: Secondary | ICD-10-CM

## 2024-07-26 MED ORDER — MELOXICAM 15 MG PO TABS
15.0000 mg | ORAL_TABLET | Freq: Every day | ORAL | 0 refills | Status: DC
Start: 2024-07-26 — End: 2024-08-16

## 2024-07-26 NOTE — Telephone Encounter (Signed)
 Called patient. Scheduled for follow up on 08/10/24.  She is asking if partial refill can be sent to last until she has visit with PCP.   Please advise.   Chiquita JAYSON English, RN

## 2024-07-31 NOTE — Progress Notes (Signed)
 Sandra Lozano - 63 y.o. female MRN [id number]  Date of birth: December 13, 1961  Office Visit Note: Visit Date: 07/21/2024 PCP: Nicholas Bar, MD Referred by: Nicholas Bar, MD  Subjective: Chief Complaint  Patient presents with   Lower Back - Pain   HPI:  Sandra Lozano is a 63 y.o. female who comes in today at the request of Duwaine Pouch, FNP for planned Right L4-5 Lumbar Transforaminal epidural steroid injection with fluoroscopic guidance.  The patient has failed conservative care including home exercise, medications, time and activity modification.  This injection will be diagnostic and hopefully therapeutic.  Please see requesting physician notes for further details and justification.   ROS Otherwise per HPI.  Assessment & Plan: Visit Diagnoses:    ICD-10-CM   1. Lumbar radiculopathy  M54.16 XR C-ARM NO REPORT    Epidural Steroid injection    methylPREDNISolone  acetate (DEPO-MEDROL ) injection 40 mg      Plan: No additional findings.   Meds & Orders:  Meds ordered this encounter  Medications   methylPREDNISolone  acetate (DEPO-MEDROL ) injection 40 mg    Orders Placed This Encounter  Procedures   XR C-ARM NO REPORT   Epidural Steroid injection    Follow-up: Return for visit to requesting provider as needed.   Procedures: No procedures performed  Lumbosacral Transforaminal Epidural Steroid Injection - Sub-Pedicular Approach with Fluoroscopic Guidance  Patient: Sandra Lozano      Date of Birth: 19-Oct-1961 MRN: [id number] PCP: Nicholas Bar, MD      Visit Date: 07/21/2024   Universal Protocol:    Date/Time: 07/21/2024  Consent Given By: the patient  Position: PRONE  Additional Comments: Vital signs were monitored before and after the procedure. Patient was prepped and draped in the usual sterile fashion. The correct patient, procedure, and site was verified.   Injection Procedure Details:   Procedure diagnoses: Lumbar radiculopathy [M54.16]    Meds  Administered:  Meds ordered this encounter  Medications   methylPREDNISolone  acetate (DEPO-MEDROL ) injection 40 mg    Laterality: Right  Location/Site: L4  Needle:5.0 in., 22 ga.  Short bevel or Quincke spinal needle  Needle Placement: Transforaminal  Findings:    -Comments: Excellent flow of contrast along the nerve, nerve root and into the epidural space.  Procedure Details: After squaring off the end-plates to get a true AP view, the C-arm was positioned so that an oblique view of the foramen as noted above was visualized. The target area is just inferior to the nose of the scotty dog or sub pedicular. The soft tissues overlying this structure were infiltrated with 2-3 ml. of 1% Lidocaine  without Epinephrine .  The spinal needle was inserted toward the target using a trajectory view along the fluoroscope beam.  Under AP and lateral visualization, the needle was advanced so it did not puncture dura and was located close the 6 O'Clock position of the pedical in AP tracterory. Biplanar projections were used to confirm position. Aspiration was confirmed to be negative for CSF and/or blood. A 1-2 ml. volume of Isovue-250 was injected and flow of contrast was noted at each level. Radiographs were obtained for documentation purposes.   After attaining the desired flow of contrast documented above, a 0.5 to 1.0 ml test dose of 0.25% Marcaine  was injected into each respective transforaminal space.  The patient was observed for 90 seconds post injection.  After no sensory deficits were reported, and normal lower extremity motor function was noted,   the above injectate was administered so  that equal amounts of the injectate were placed at each foramen (level) into the transforaminal epidural space.   Additional Comments:  The patient tolerated the procedure well Dressing: 2 x 2 sterile gauze and Band-Aid    Post-procedure details: Patient was observed during the procedure. Post-procedure  instructions were reviewed.  Patient left the clinic in stable condition.    Clinical History: CLINICAL DATA:  Low back pain, symptoms persist with > 6 wks treatment. Worsening chronic right hip pain. History of lumbar fusion.   EXAM: MRI LUMBAR SPINE WITHOUT CONTRAST   TECHNIQUE: Multiplanar, multisequence MR imaging of the lumbar spine was performed. No intravenous contrast was administered.   COMPARISON:  Lumbar spine MRI 11/13/2018   FINDINGS: Segmentation:  Standard.   Alignment: Grade 1 anterolisthesis of L4 on L5, reduced following interval fusion. Slight right convex curvature of the lumbar spine.   Vertebrae: No fracture, suspicious marrow lesion, or significant marrow edema. Interval L4-5 PLIF with evidence of solid arthrodesis.   Conus medullaris and cauda equina: Conus extends to the L1-2 level. Conus and cauda equina appear normal.   Paraspinal and other soft tissues: Postoperative changes in the posterior lower lumbar soft tissues. No fluid collection.   Disc levels:   T12-L1: Mild facet and ligamentum flavum hypertrophy without disc herniation or stenosis.   L1-2: Mild facet and ligamentum flavum hypertrophy without disc herniation or stenosis.   L2-3: Increased, mild disc bulging and moderate facet and ligamentum flavum hypertrophy result in borderline to mild bilateral neural foraminal stenosis without spinal stenosis.   L3-4: Interval laminectomies. Increased disc bulging and moderate to severe facet hypertrophy result in new mild to moderate bilateral neural foraminal stenosis without spinal stenosis.   L4-5: Interval wide posterior decompression and fusion. Widely patent spinal canal and right neural foramen. Suboptimal assessment of the left neural foramen although no residual compressive stenosis is suspected status post facetectomy.   L5-S1: Minimal disc bulging and moderate facet and ligamentum flavum hypertrophy have progressed and  result in borderline to mild bilateral neural foraminal stenosis without spinal stenosis.   IMPRESSION: 1. Widely patent spinal canal at L3-4 and L4-5 following interval posterior decompression. 2. New mild to moderate bilateral neural foraminal stenosis at L3-4. 3. Borderline to mild neural foraminal stenosis at L2-3 and L5-S1.     Electronically Signed   By: Dasie Hamburg M.D.   On: 05/29/2024 10:23     Objective:  VS:  HT:    WT:   BMI:     BP:(!) 148/81  HR:62bpm  TEMP: ( )  RESP:  Physical Exam Vitals and nursing note reviewed.  Constitutional:      General: She is not in acute distress.    Appearance: Normal appearance. She is not ill-appearing.  HENT:     Head: Normocephalic and atraumatic.     Right Ear: External ear normal.     Left Ear: External ear normal.  Eyes:     Extraocular Movements: Extraocular movements intact.  Cardiovascular:     Rate and Rhythm: Normal rate.     Pulses: Normal pulses.  Pulmonary:     Effort: Pulmonary effort is normal. No respiratory distress.  Abdominal:     General: There is no distension.     Palpations: Abdomen is soft.  Musculoskeletal:        General: Tenderness present.     Cervical back: Neck supple.     Right lower leg: No edema.     Left lower leg: No  edema.     Comments: Patient has good distal strength with no pain over the greater trochanters.  No clonus or focal weakness.  Skin:    Findings: No erythema, lesion or rash.  Neurological:     General: No focal deficit present.     Mental Status: She is alert and oriented to person, place, and time.     Sensory: No sensory deficit.     Motor: No weakness or abnormal muscle tone.     Coordination: Coordination normal.  Psychiatric:        Mood and Affect: Mood normal.        Behavior: Behavior normal.      Imaging: No results found.

## 2024-07-31 NOTE — Procedures (Signed)
 Lumbosacral Transforaminal Epidural Steroid Injection - Sub-Pedicular Approach with Fluoroscopic Guidance  Patient: Sandra Lozano      Date of Birth: Oct 30, 1961 MRN: [id number] PCP: Nicholas Bar, MD      Visit Date: 07/21/2024   Universal Protocol:    Date/Time: 07/21/2024  Consent Given By: the patient  Position: PRONE  Additional Comments: Vital signs were monitored before and after the procedure. Patient was prepped and draped in the usual sterile fashion. The correct patient, procedure, and site was verified.   Injection Procedure Details:   Procedure diagnoses: Lumbar radiculopathy [M54.16]    Meds Administered:  Meds ordered this encounter  Medications   methylPREDNISolone  acetate (DEPO-MEDROL ) injection 40 mg    Laterality: Right  Location/Site: L4  Needle:5.0 in., 22 ga.  Short bevel or Quincke spinal needle  Needle Placement: Transforaminal  Findings:    -Comments: Excellent flow of contrast along the nerve, nerve root and into the epidural space.  Procedure Details: After squaring off the end-plates to get a true AP view, the C-arm was positioned so that an oblique view of the foramen as noted above was visualized. The target area is just inferior to the nose of the scotty dog or sub pedicular. The soft tissues overlying this structure were infiltrated with 2-3 ml. of 1% Lidocaine  without Epinephrine .  The spinal needle was inserted toward the target using a trajectory view along the fluoroscope beam.  Under AP and lateral visualization, the needle was advanced so it did not puncture dura and was located close the 6 O'Clock position of the pedical in AP tracterory. Biplanar projections were used to confirm position. Aspiration was confirmed to be negative for CSF and/or blood. A 1-2 ml. volume of Isovue-250 was injected and flow of contrast was noted at each level. Radiographs were obtained for documentation purposes.   After attaining the desired flow of  contrast documented above, a 0.5 to 1.0 ml test dose of 0.25% Marcaine  was injected into each respective transforaminal space.  The patient was observed for 90 seconds post injection.  After no sensory deficits were reported, and normal lower extremity motor function was noted,   the above injectate was administered so that equal amounts of the injectate were placed at each foramen (level) into the transforaminal epidural space.   Additional Comments:  The patient tolerated the procedure well Dressing: 2 x 2 sterile gauze and Band-Aid    Post-procedure details: Patient was observed during the procedure. Post-procedure instructions were reviewed.  Patient left the clinic in stable condition.

## 2024-08-01 ENCOUNTER — Ambulatory Visit (INDEPENDENT_AMBULATORY_CARE_PROVIDER_SITE_OTHER): Admitting: Podiatry

## 2024-08-01 DIAGNOSIS — M2041 Other hammer toe(s) (acquired), right foot: Secondary | ICD-10-CM

## 2024-08-01 NOTE — Progress Notes (Signed)
   Chief Complaint  Patient presents with   Foot Pain    Patient is here for left foot pain and stiffness, toe are internally fighting Would also like to discuss next surgery    Subjective:  Patient presents today status post hammertoe repair digits 2-4 of the left foot.  DOS: 01/28/2024.  Patient is doing very well.  She has no pain to the foot.  She does notice some stiffness to the toes.  She would like to discuss possible hammertoe correction to the right foot  Brief history: History of Lapidus bunionectomy left lower extremity about 2 years ago with Dr. Harden.  Recently underwent right knee replacement on 07/17/2023 with Dr. Harden. History of hammertoe repair digits 2-4 left foot; DOS: 01/28/2024.  Past Medical History:  Diagnosis Date   Arthritis    right knee, lower back   Chest wall pain 04/20/2021   Dyspnea    very rare -tx with albuterol  neb sol if needed   Hyperlipidemia    Hypertension    Intertrigo 08/06/2022   Pre-diabetes    per patient - does not check blood sugar   Right hip pain 05/05/2018   Rupture of anterior cruciate ligament of right knee 02/15/2018   Sprain of medial collateral ligament of right knee 02/15/2018   SVD (spontaneous vaginal delivery)    x 5 - only 2 living, 1 stillborn and 1 premature at 7 months demise,1 child deceased   Wears dentures    upper and lower    Past Surgical History:  Procedure Laterality Date   FOOT ARTHRODESIS Left 11/02/2020   Procedure: LEFT MIDFOOT FUSION BASE 1ST AND 2ND METATARSAL;  Surgeon: Harden Jerona GAILS, MD;  Location: [facility] OR;  Service: Orthopedics;  Laterality: Left;   LUMBAR FUSION  05/20/2019   GILL PROCEDURE, LEFT TRANSFORAMINAL LUMBAR INTERBODY FUSION, PEDICLE INSTRUMENTATION, BILATERAL FUSION (N/A   TOTAL KNEE ARTHROPLASTY Right 08/21/2023   Procedure: RIGHT TOTAL KNEE ARTHROPLASTY;  Surgeon: Harden Jerona GAILS, MD;  Location: [facility] OR;  Service: Orthopedics;  Laterality: Right;   TUBAL LIGATION      No Known  Allergies  Objective/Physical Exam Neurovascular status intact.  Rectus alignment of the toes noted to the left foot.  Stable.  There is no pain with palpation range of motion however there is some limited range of motion to the lesser MTPs  Reducible hammertoe contracture noted to the lesser digits of the right foot as well.  No pain or tenderness   Assessment: 1. s/p hammertoe repair 2-4 LT. DOS: 01/28/2024 2.  Reducible hammertoes right foot  Plan of Care:  -Patient was evaluated.  -Currently the patient is experiencing no pain or tenderness to the hammertoes.  I do not recommend surgery at this time and simply recommend conservative treatment including good supportive tennis shoes and sneakers.  Patient is fine to pursue this for now -Return to clinic PRN  Thresa EMERSON Sar, DPM [facility]  Dr. Thresa EMERSON Sar, DPM    [address] [place], [place] [address]                Office [phone]  Fax [phone]

## 2024-08-04 DIAGNOSIS — G5603 Carpal tunnel syndrome, bilateral upper limbs: Secondary | ICD-10-CM | POA: Diagnosis not present

## 2024-08-04 DIAGNOSIS — G5621 Lesion of ulnar nerve, right upper limb: Secondary | ICD-10-CM | POA: Diagnosis not present

## 2024-08-04 DIAGNOSIS — G629 Polyneuropathy, unspecified: Secondary | ICD-10-CM | POA: Diagnosis not present

## 2024-08-10 ENCOUNTER — Ambulatory Visit: Admitting: Family Medicine

## 2024-08-16 ENCOUNTER — Encounter: Payer: Self-pay | Admitting: Family Medicine

## 2024-08-16 ENCOUNTER — Ambulatory Visit (INDEPENDENT_AMBULATORY_CARE_PROVIDER_SITE_OTHER): Admitting: Family Medicine

## 2024-08-16 VITALS — BP 131/66 | HR 65 | Ht 64.0 in | Wt 196.4 lb

## 2024-08-16 DIAGNOSIS — Z23 Encounter for immunization: Secondary | ICD-10-CM

## 2024-08-16 DIAGNOSIS — H6991 Unspecified Eustachian tube disorder, right ear: Secondary | ICD-10-CM

## 2024-08-16 DIAGNOSIS — L732 Hidradenitis suppurativa: Secondary | ICD-10-CM

## 2024-08-16 DIAGNOSIS — M19071 Primary osteoarthritis, right ankle and foot: Secondary | ICD-10-CM

## 2024-08-16 MED ORDER — ZIKS ARTHRITIS PAIN RELIEF 0.025-1-12 % EX CREA: 1.0000 | TOPICAL_CREAM | CUTANEOUS | 0 refills | Status: DC | PRN

## 2024-08-16 MED ORDER — MELOXICAM 7.5 MG PO TABS: 7.5000 mg | ORAL_TABLET | Freq: Every day | ORAL | 0 refills | Status: DC

## 2024-08-16 MED ORDER — CLINDAMYCIN PHOS (ONCE-DAILY) 1 % EX GEL
1.0000 | Freq: Every day | CUTANEOUS | 0 refills | Status: AC
Start: 2024-08-16 — End: ?

## 2024-08-16 MED ORDER — FLUTICASONE PROPIONATE 50 MCG/ACT NA SUSP: 2.0000 | Freq: Every day | NASAL | 6 refills | Status: AC

## 2024-08-16 NOTE — Assessment & Plan Note (Signed)
 Shared decision making to continue meloxicam  at this time but decreased to 7.5 mg daily for 30 pills each month.  - Discussed other non pharmacologic methods as well and to take NSAID holidays if possible  - Sarna cream

## 2024-08-16 NOTE — Patient Instructions (Signed)
 It was wonderful to see you today.  Please bring ALL of your medications with you to every visit.   Today we talked about:  Hidradenitis Supprativa - I prescribed the clinda gel to the pharmacy. You can also get hibiclens  over the counter or online. If it does get worse let me know and I can prescribe an antibiotic.   For your osteoarthritis - I continued the meloxicam  but decreased the dose to 7.5. I gave you a 30 day supply. Try to take break days if you can without it. I also sent in a cream that may help.   For your ear - you can do one drop of mineral oil in each ear canal to help loosen some wax. Otherwise I believe you may have had a virus or allergies causing some cloudiness of your ear drum. I have prescribed flonase  you can try this.   Thank you for choosing [facility].   Please call [phone] with any questions about today's appointment.  Please be sure to schedule follow up at the front desk before you leave today.   Areta Saliva, MD  Family Medicine

## 2024-08-16 NOTE — Assessment & Plan Note (Signed)
 Most likely will have a flare soon. Decided to wait to do antibiotics until patient has a flare.  - Refill clinda gel  - Recommended patient use hibiclens  as well

## 2024-08-16 NOTE — Progress Notes (Signed)
    SUBJECTIVE:   CHIEF COMPLAINT / HPI:   Chronic ankle pain Chronic NSAIDs Patient has been following with Dr. Harden for this. Has tried cortisone injection which was not helpful. Patient has been able to cut the tablets in half and do daily 1/2 tablet of 15 mg meloxicam  daily with a tylenol . She has been extensively counseled on risk of long term NSAIDs and understands risks. She is willing to decrease dose to 7.5 mg but wants to continue. She has been making a homemade paste that has been also helping her ankle pain by applying it topically using avocado and cayenne. She is amenable to trying a sarna cream.   Right ear fullness  Patient describes right ear fullness for the last week or so. Denies drainage, fevers. Denies recent sick symptoms. Denies q tip use. Does have environmental allergies and says her eyes have been itchy as well recently.   HS Patient feels like she has a HS flare on the cusp of starting in her right axilla. Says there is no boil there as of yet, but feels like it is starting to pull. She found hibiclens  and clindagel very helpful previously.   PERTINENT  PMH / PSH: T2DM, Hidradenitis  OBJECTIVE:   BP 131/66   Pulse 65   Ht 5' 4 (1.626 m)   Wt 196 lb 6.4 oz (89.1 kg)   LMP  (LMP Unknown)   SpO2 100%   BMI 33.71 kg/m   General: well appearing, in no acute distress HEENT: left ear canal with some cerumen in canal, normal TM, right ear canal with some cerumen and mild effusion behind TM, no erythema or bulging.  CV: well perfused  Resp: Normal work of breathing on room air    ASSESSMENT/PLAN:   Assessment & Plan Arthritis of right midfoot Shared decision making to continue meloxicam  at this time but decreased to 7.5 mg daily for 30 pills each month.  - Discussed other non pharmacologic methods as well and to take NSAID holidays if possible  - Sarna cream Hidradenitis suppurativa of right axilla Most likely will have a flare soon. Decided to wait to  do antibiotics until patient has a flare.  - Refill clinda gel  - Recommended patient use hibiclens  as well  Eustachian tube dysfunction, right Most likely patient has some eustachian tube dysfunction and effusion either from recent viral illness or allergies.  - Trial of flonase   - Follow up if worsens.    Follow up in about 4 weeks for annual physical   Areta Saliva, MD [facility]

## 2024-08-24 ENCOUNTER — Other Ambulatory Visit: Payer: Self-pay | Admitting: Family Medicine

## 2024-08-24 DIAGNOSIS — E119 Type 2 diabetes mellitus without complications: Secondary | ICD-10-CM

## 2024-08-26 DIAGNOSIS — Z419 Encounter for procedure for purposes other than remedying health state, unspecified: Secondary | ICD-10-CM | POA: Diagnosis not present

## 2024-08-29 ENCOUNTER — Ambulatory Visit: Admitting: Family Medicine

## 2024-08-29 VITALS — BP 134/90 | Ht 64.0 in | Wt 197.0 lb

## 2024-08-29 DIAGNOSIS — G5621 Lesion of ulnar nerve, right upper limb: Secondary | ICD-10-CM

## 2024-08-29 DIAGNOSIS — G5603 Carpal tunnel syndrome, bilateral upper limbs: Secondary | ICD-10-CM | POA: Diagnosis not present

## 2024-08-29 NOTE — Patient Instructions (Signed)
 Referral to [facility] to see either Dr. Camella or Dr. Shari Address: [address], [place], [place] [address] Phone: [phone]

## 2024-08-29 NOTE — Assessment & Plan Note (Addendum)
 EMG results discussed with patient, given risk for further entrapment and permanent median nerve neuropathy, discussed and placed referral for carpal tunnel release with orthopedic surgery.  Clinically some ulnar neuropathic symptoms consistent with cubital tunnel syndrome and seen on EMG. Peripheral neuropathy and possible radiculopathy seen on EMG are not clinically apparent. - Ambulatory referral to hand surgery - Continue bilateral wrist braces at night - Discussed reaching out to her PCP to increase gabapentin  to 900mg  TID for neuropathic pain

## 2024-08-29 NOTE — Progress Notes (Signed)
 PCP: Nicholas Bar, MD  Subjective:   HPI: Patient is a 63 y.o. female here for follow-up bilateral carpal tunnel.  Patient reports she has had continued pain bilaterally in her wrists Pain is worse on the right She has noticed that she has had more difficulty keeping her grip specifically on the left side Had EMG study done and sent to our clinic Is taking gabapentin  900 mg nightly, unclear if this is helping She is continuing to use her wrist braces at night She is unsure if she had significant benefit from previous right-sided carpal tunnel injection Numbness and tingling is in all finger tips No benefit from prior steroid burst   Past Medical History:  Diagnosis Date   Arthritis    right knee, lower back   Chest wall pain 04/20/2021   Dyspnea    very rare -tx with albuterol  neb sol if needed   Hyperlipidemia    Hypertension    Intertrigo 08/06/2022   Pre-diabetes    per patient - does not check blood sugar   Right hip pain 05/05/2018   Rupture of anterior cruciate ligament of right knee 02/15/2018   Sprain of medial collateral ligament of right knee 02/15/2018   SVD (spontaneous vaginal delivery)    x 5 - only 2 living, 1 stillborn and 1 premature at 7 months demise,1 child deceased   Wears dentures    upper and lower    Current Outpatient Medications on File Prior to Visit  Medication Sig Dispense Refill   acetaminophen  (TYLENOL ) 650 MG CR tablet Take 1 tablet (650 mg total) by mouth every 8 (eight) hours as needed for pain. 90 tablet 0   atorvastatin  (LIPITOR) 20 MG tablet TAKE 1 TABLET BY MOUTH ONCE DAILY IN THE EVENING 90 tablet 3   Capsaicin-Menthol -Methyl Sal (CAPSAICIN-METHYL SAL-MENTHOL ) 0.025-1-12 % CREA Apply 1 Application topically as needed. 56.6 g 0   Clindamycin  Phos, Once-Daily, (CLINDAGEL) 1 % GEL Apply 1 Application topically daily. 75 mL 0   fluticasone  (FLONASE ) 50 MCG/ACT nasal spray Place 2 sprays into both nostrils daily. 16 g 6   gabapentin   (NEURONTIN ) 600 MG tablet Take 1 tablet (600 mg total) by mouth at bedtime. 60 tablet 0   lisinopril -hydrochlorothiazide  (ZESTORETIC ) 20-12.5 MG tablet Take 2 tablets by mouth once daily 180 tablet 3   meloxicam  (MOBIC ) 7.5 MG tablet Take 1 tablet (7.5 mg total) by mouth daily. 30 tablet 0   metFORMIN  (GLUCOPHAGE ) 500 MG tablet Take 1 tablet by mouth once daily with breakfast 90 tablet 0   Multiple Minerals-Vitamins (CAL-MAG-ZINC-D PO) Take 1 tablet by mouth in the morning.     Omega-3 Fatty Acids (FISH OIL  PO) Take 1 capsule by mouth in the morning.     OZEMPIC , 0.25 OR 0.5 MG/DOSE, 2 MG/3ML SOPN INJECT 0.25 SUBCUTANEOUSLY ONCE A WEEK FOR 4 WEEKS THEN INCREASE TO 0.5 MG WEEKLY FOR AT LEAST 4 WEEKS. 3 mL 0   predniSONE  (DELTASONE ) 10 MG tablet 6 tabs po day 1, 5 tabs po day 2, 4 tabs po day 3, 3 tabs po day 4, 2 tabs po day 5, 1 tab po day 6 21 tablet 0   RESTASIS 0.05 % ophthalmic emulsion Place 1 drop into both eyes 2 (two) times daily.     Skin Oils (CASTOR OIL EX) Apply 1 Application topically daily. Applied to knees     TURMERIC PO Take 1 capsule by mouth in the morning.     No current facility-administered medications on file  prior to visit.    Past Surgical History:  Procedure Laterality Date   FOOT ARTHRODESIS Left 11/02/2020   Procedure: LEFT MIDFOOT FUSION BASE 1ST AND 2ND METATARSAL;  Surgeon: Harden Jerona GAILS, MD;  Location: [facility] OR;  Service: Orthopedics;  Laterality: Left;   LUMBAR FUSION  05/20/2019   GILL PROCEDURE, LEFT TRANSFORAMINAL LUMBAR INTERBODY FUSION, PEDICLE INSTRUMENTATION, BILATERAL FUSION (N/A   TOTAL KNEE ARTHROPLASTY Right 08/21/2023   Procedure: RIGHT TOTAL KNEE ARTHROPLASTY;  Surgeon: Harden Jerona GAILS, MD;  Location: [facility] OR;  Service: Orthopedics;  Laterality: Right;   TUBAL LIGATION      No Known Allergies  BP (!) 180/110   Ht 5' 4 (1.626 m)   Wt 197 lb (89.4 kg)   LMP  (LMP Unknown)   BMI 33.81 kg/m       No data to display              No data to  display              Objective:  Physical Exam:  Gen: NAD, comfortable in exam room  Bilateral carpal tunnel Inspection: No obvious deformity, erythema, swelling, ecchymoses Palpation: NTTP at carpal tunnel, cubital tunnel, Guyon's canal, no palpable subluxation of the ulnar nerve at the cubital canal ROM: Full range of motion of wrist flexion extension bilaterally Special Tests: Negative Tinel's, negative Phalen's Strength: Right thumb opposition 4+/5, grip strength bilaterally 4+/5, wrist extension, flexion 5/5, bilateral biceps and triceps extension 5/5  EMG scanned in the media tab-1 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  Clinically some ulnar neuropathic symptoms consistent with cubital tunnel syndrome and seen on EMG. Peripheral neuropathy and possible radiculopathy seen on EMG are not clinically apparent. - Ambulatory referral to hand surgery - Continue bilateral wrist braces at night - Discussed reaching out to her PCP to increase gabapentin  to 900mg  TID for neuropathic pain  Ozell Provencal, MD, PGY-3 [facility] 10:13 AM 08/29/2024

## 2024-09-04 ENCOUNTER — Other Ambulatory Visit: Payer: Self-pay | Admitting: Family Medicine

## 2024-09-04 DIAGNOSIS — G5601 Carpal tunnel syndrome, right upper limb: Secondary | ICD-10-CM

## 2024-09-10 ENCOUNTER — Other Ambulatory Visit: Payer: Self-pay | Admitting: Family Medicine

## 2024-09-10 DIAGNOSIS — M19071 Primary osteoarthritis, right ankle and foot: Secondary | ICD-10-CM

## 2024-09-12 ENCOUNTER — Ambulatory Visit

## 2024-09-23 ENCOUNTER — Other Ambulatory Visit: Payer: Self-pay | Admitting: Family Medicine

## 2024-09-23 DIAGNOSIS — E119 Type 2 diabetes mellitus without complications: Secondary | ICD-10-CM

## 2024-09-30 ENCOUNTER — Ambulatory Visit: Admitting: Family

## 2024-09-30 ENCOUNTER — Encounter: Payer: Self-pay | Admitting: Family

## 2024-09-30 DIAGNOSIS — M25571 Pain in right ankle and joints of right foot: Secondary | ICD-10-CM

## 2024-09-30 DIAGNOSIS — M25871 Other specified joint disorders, right ankle and foot: Secondary | ICD-10-CM | POA: Diagnosis not present

## 2024-09-30 MED ORDER — METHYLPREDNISOLONE ACETATE 40 MG/ML IJ SUSP
40.0000 mg | INTRAMUSCULAR | Status: AC | PRN
  Administered 2024-09-30: 40 mg via INTRA_ARTICULAR

## 2024-09-30 MED ORDER — LIDOCAINE HCL 1 % IJ SOLN
2.0000 mL | INTRAMUSCULAR | Status: AC | PRN
  Administered 2024-09-30: 2 mL

## 2024-09-30 NOTE — Progress Notes (Signed)
 Office Visit Note   Patient: Sandra Lozano           Date of Birth: 08/04/1961           MRN: [id number] Visit Date: 09/30/2024              Requested by: Nicholas Bar, MD [address] [place],  [place] [address] PCP: Nicholas Bar, MD  Chief Complaint  Patient presents with   Right Ankle - Pain      HPI: The patient is a 63 year old woman who presents for return of her chronic right ankle pain.  She has had issues with this for a couple years now she wears a neoprene ankle brace daily which provides her with modest improvement in her pain she has had Depo-Medrol  injections in the past which have provided her interval relief she is requesting repeat injection today  Denies any instability of the ankle  Assessment & Plan: Visit Diagnoses: No diagnosis found.  Plan: Depo-Medrol  injection right ankle.  Patient tolerated well.  Follow-up as needed  Follow-Up Instructions: No follow-ups on file.   Right Ankle Exam   Tenderness  Right ankle tenderness location: Anterior joint line.  Range of Motion  The patient has normal right ankle ROM.  Other  Erythema: absent Sensation: normal Pulse: present       Patient is alert, oriented, no adenopathy, well-dressed, normal affect, normal respiratory effort.     Imaging: No results found. No images are attached to the encounter.  Labs: Lab Results  Component Value Date   HGBA1C 5.5 04/04/2024   HGBA1C 5.8 12/10/2023   HGBA1C 5.5 07/01/2023   ESRSEDRATE 11 11/26/2021   LABURIC 5.6 09/08/2023   LABURIC 5.2 11/26/2021     Lab Results  Component Value Date   ALBUMIN  4.0 05/18/2019    No results found for: MG No results found for: VD25OH  No results found for: PREALBUMIN    Latest Ref Rng & Units 08/13/2023    9:10 AM 07/01/2023    9:06 AM 11/02/2020    8:05 AM  CBC EXTENDED  WBC 4.0 - 10.5 K/uL 6.0  6.1    RBC 3.87 - 5.11 MIL/uL 4.57  4.38    Hemoglobin 12.0 - 15.0 g/dL 86.8  87.6  85.3   HCT  36.0 - 46.0 % 40.7  38.5  43.0   Platelets 150 - 400 K/uL 272  296       There is no height or weight on file to calculate BMI.  Orders:  No orders of the defined types were placed in this encounter.  No orders of the defined types were placed in this encounter.    Procedures: Medium Joint Inj: R ankle on 09/30/2024 9:37 AM Indications: pain Details: 25 G needle, anterolateral approach Medications: 2 mL lidocaine  1 %; 40 mg methylPREDNISolone  acetate 40 MG/ML Outcome: tolerated well, no immediate complications Consent was given by the patient. Patient was prepped and draped in the usual sterile fashion.      Clinical Data: No additional findings.  ROS:  All other systems negative, except as noted in the HPI. Review of Systems  Objective: Vital Signs: LMP  (LMP Unknown)   Specialty Comments:  CLINICAL DATA:  Low back pain, symptoms persist with > 6 wks treatment. Worsening chronic right hip pain. History of lumbar fusion.   EXAM: MRI LUMBAR SPINE WITHOUT CONTRAST   TECHNIQUE: Multiplanar, multisequence MR imaging of the lumbar spine was performed. No intravenous contrast was  administered.   COMPARISON:  Lumbar spine MRI 11/13/2018   FINDINGS: Segmentation:  Standard.   Alignment: Grade 1 anterolisthesis of L4 on L5, reduced following interval fusion. Slight right convex curvature of the lumbar spine.   Vertebrae: No fracture, suspicious marrow lesion, or significant marrow edema. Interval L4-5 PLIF with evidence of solid arthrodesis.   Conus medullaris and cauda equina: Conus extends to the L1-2 level. Conus and cauda equina appear normal.   Paraspinal and other soft tissues: Postoperative changes in the posterior lower lumbar soft tissues. No fluid collection.   Disc levels:   T12-L1: Mild facet and ligamentum flavum hypertrophy without disc herniation or stenosis.   L1-2: Mild facet and ligamentum flavum hypertrophy without disc herniation or  stenosis.   L2-3: Increased, mild disc bulging and moderate facet and ligamentum flavum hypertrophy result in borderline to mild bilateral neural foraminal stenosis without spinal stenosis.   L3-4: Interval laminectomies. Increased disc bulging and moderate to severe facet hypertrophy result in new mild to moderate bilateral neural foraminal stenosis without spinal stenosis.   L4-5: Interval wide posterior decompression and fusion. Widely patent spinal canal and right neural foramen. Suboptimal assessment of the left neural foramen although no residual compressive stenosis is suspected status post facetectomy.   L5-S1: Minimal disc bulging and moderate facet and ligamentum flavum hypertrophy have progressed and result in borderline to mild bilateral neural foraminal stenosis without spinal stenosis.   IMPRESSION: 1. Widely patent spinal canal at L3-4 and L4-5 following interval posterior decompression. 2. New mild to moderate bilateral neural foraminal stenosis at L3-4. 3. Borderline to mild neural foraminal stenosis at L2-3 and L5-S1.     Electronically Signed   By: Dasie Hamburg M.D.   On: 05/29/2024 10:23  PMFS History: Patient Active Problem List   Diagnosis Date Noted   Chronic pain of right ankle 02/01/2024   Benign lichenoid keratosis 12/24/2023   Bruising 12/24/2023   Unilateral primary osteoarthritis, right knee 08/21/2023   Total knee replacement status, right 08/21/2023   Hidradenitis suppurativa of right axilla 06/03/2023   Vaginal irritation 02/21/2023   Tinea 12/18/2022   Left shoulder pain 08/25/2022   DJD (degenerative joint disease), multiple sites 08/06/2022   COPD, mild (HCC) 02/11/2022   Carpal tunnel syndrome, bilateral 12/21/2021   Arthritis of finger of left hand 11/26/2021   Traumatic arthritis of left foot    Lichen simplex chronicus 06/04/2020   Arthritis of midfoot 03/14/2020   History of lumbar fusion 03/14/2020   Well controlled diabetes  mellitus (HCC) 11/07/2019   Nummular eczema 07/06/2019   Lumbar stenosis 05/20/2019   Acute left-sided low back pain without sciatica 07/18/2018   Osteoarthritis of right hip 06/03/2018   Chronic right shoulder pain 05/05/2018   Essential hypertension 02/15/2018   Past Medical History:  Diagnosis Date   Arthritis    right knee, lower back   Chest wall pain 04/20/2021   Dyspnea    very rare -tx with albuterol  neb sol if needed   Hyperlipidemia    Hypertension    Intertrigo 08/06/2022   Pre-diabetes    per patient - does not check blood sugar   Right hip pain 05/05/2018   Rupture of anterior cruciate ligament of right knee 02/15/2018   Sprain of medial collateral ligament of right knee 02/15/2018   SVD (spontaneous vaginal delivery)    x 5 - only 2 living, 1 stillborn and 1 premature at 7 months demise,1 child deceased   Wears dentures  upper and lower    Family History  Problem Relation Age of Onset   Cancer Sister    Healthy Daughter    Healthy Daughter     Past Surgical History:  Procedure Laterality Date   FOOT ARTHRODESIS Left 11/02/2020   Procedure: LEFT MIDFOOT FUSION BASE 1ST AND 2ND METATARSAL;  Surgeon: Harden Jerona GAILS, MD;  Location: [facility] OR;  Service: Orthopedics;  Laterality: Left;   LUMBAR FUSION  05/20/2019   GILL PROCEDURE, LEFT TRANSFORAMINAL LUMBAR INTERBODY FUSION, PEDICLE INSTRUMENTATION, BILATERAL FUSION (N/A   TOTAL KNEE ARTHROPLASTY Right 08/21/2023   Procedure: RIGHT TOTAL KNEE ARTHROPLASTY;  Surgeon: Harden Jerona GAILS, MD;  Location: [facility] OR;  Service: Orthopedics;  Laterality: Right;   TUBAL LIGATION     Social History   Occupational History   Not on file  Tobacco Use   Smoking status: Former    Current packs/day: 0.00    Average packs/day: 1 pack/day for 30.0 years (30.0 ttl pk-yrs)    Types: Cigarettes    Start date: 01/1985    Quit date: 01/2015    Years since quitting: 9.7    Passive exposure: Past   Smokeless tobacco: Never  Vaping Use    Vaping status: Never Used  Substance and Sexual Activity   Alcohol use: Yes    Comment: occ   Drug use: Yes    Types: Marijuana   Sexual activity: Not on file

## 2024-10-04 ENCOUNTER — Other Ambulatory Visit: Payer: Self-pay | Admitting: Family Medicine

## 2024-10-04 DIAGNOSIS — E119 Type 2 diabetes mellitus without complications: Secondary | ICD-10-CM

## 2024-10-09 ENCOUNTER — Other Ambulatory Visit: Payer: Self-pay | Admitting: Family Medicine

## 2024-10-09 DIAGNOSIS — M19071 Primary osteoarthritis, right ankle and foot: Secondary | ICD-10-CM

## 2024-10-17 ENCOUNTER — Encounter: Payer: Self-pay | Admitting: Radiology

## 2024-10-28 DIAGNOSIS — G5603 Carpal tunnel syndrome, bilateral upper limbs: Secondary | ICD-10-CM | POA: Diagnosis not present

## 2024-10-30 ENCOUNTER — Other Ambulatory Visit: Payer: Self-pay | Admitting: Family Medicine

## 2024-10-30 DIAGNOSIS — G5601 Carpal tunnel syndrome, right upper limb: Secondary | ICD-10-CM

## 2024-11-03 ENCOUNTER — Other Ambulatory Visit: Payer: Self-pay | Admitting: Family Medicine

## 2024-11-03 DIAGNOSIS — Z1231 Encounter for screening mammogram for malignant neoplasm of breast: Secondary | ICD-10-CM

## 2024-11-07 ENCOUNTER — Other Ambulatory Visit: Payer: Self-pay | Admitting: Family Medicine

## 2024-11-07 DIAGNOSIS — M19071 Primary osteoarthritis, right ankle and foot: Secondary | ICD-10-CM

## 2024-11-08 DIAGNOSIS — G5603 Carpal tunnel syndrome, bilateral upper limbs: Secondary | ICD-10-CM | POA: Diagnosis not present

## 2024-11-08 DIAGNOSIS — G5622 Lesion of ulnar nerve, left upper limb: Secondary | ICD-10-CM | POA: Diagnosis not present

## 2024-11-11 ENCOUNTER — Other Ambulatory Visit: Payer: Self-pay | Admitting: Family Medicine

## 2024-11-11 DIAGNOSIS — E119 Type 2 diabetes mellitus without complications: Secondary | ICD-10-CM

## 2024-11-21 DIAGNOSIS — G5602 Carpal tunnel syndrome, left upper limb: Secondary | ICD-10-CM | POA: Diagnosis not present

## 2024-11-24 ENCOUNTER — Ambulatory Visit: Admitting: Orthopedic Surgery

## 2024-11-24 ENCOUNTER — Encounter: Payer: Self-pay | Admitting: Orthopedic Surgery

## 2024-11-24 DIAGNOSIS — M1712 Unilateral primary osteoarthritis, left knee: Secondary | ICD-10-CM

## 2024-11-24 DIAGNOSIS — Z96651 Presence of right artificial knee joint: Secondary | ICD-10-CM | POA: Diagnosis not present

## 2024-11-24 DIAGNOSIS — M25871 Other specified joint disorders, right ankle and foot: Secondary | ICD-10-CM | POA: Diagnosis not present

## 2024-11-24 MED ORDER — METHYLPREDNISOLONE ACETATE 40 MG/ML IJ SUSP
40.0000 mg | INTRAMUSCULAR | Status: AC | PRN
  Administered 2024-11-24: 40 mg via INTRA_ARTICULAR

## 2024-11-24 MED ORDER — LIDOCAINE HCL (PF) 1 % IJ SOLN
5.0000 mL | INTRAMUSCULAR | Status: AC | PRN
  Administered 2024-11-24: 5 mL

## 2024-11-24 NOTE — Progress Notes (Signed)
 Office Visit Note   Patient: Sandra Lozano           Date of Birth: 09-11-1961           MRN: [id number] Visit Date: 11/24/2024              Requested by: Nicholas Bar, MD [address] [place],  [place] [address] PCP: Nicholas Bar, MD  Chief Complaint  Patient presents with   Left Knee - Pain      HPI: Discussed the use of AI scribe software for clinical note transcription with the patient, who gave verbal consent to proceed.  History of Present Illness Sandra Lozano is a 63 year old female who presents with ongoing numbness and arthritis pain.  She experiences persistent numbness on the lateral aspect of her right knee following a total knee arthroplasty performed seven years ago. Pain patches are used to manage the numbness, which is not bothersome unless the area is rubbed.  She has arthritis pain in her left knee, described as less severe than her right knee prior to surgery. The pain is primarily located on the medial side and radiates to the back of the knee. Pain patches are also used for this knee.  She reports issues with her right ankle and hammer toes, which she believes affect her balance. Her toes are described as 'all clawed up', and she uses gel pads to alleviate discomfort. She experiences throbbing arthritis pain in her ankle, which may be weather-related. She notes difficulty walking straight and maintaining balance, feeling weak and not strong enough.  She is actively working on strengthening exercises to improve her balance and overall strength, particularly in her knee, as it has been one year since her surgery. She practices walking and standing on her toes as part of her routine. She experiences variability in her symptoms, with some days being better than others.     Assessment & Plan: Visit Diagnoses:  1. Ankle impingement syndrome, right   2. Status post right knee replacement   3. Unilateral primary osteoarthritis, left knee      Plan: Assessment and Plan Assessment & Plan Primary osteoarthritis, left knee Chronic osteoarthritis with significant pain and tenderness, exacerbated by weakness and imbalance. - Injected 1 cc Depa-Medrol  and 5 cc of 1% lidocaine  into left knee. - Recommended strengthening exercises. - Advised supportive footwear for cushioning and support.  Status post right total knee arthroplasty with residual numbness Residual numbness on lateral right knee, not significantly impacting daily activities. - Continue pain patches as needed.  Hammer toes and osteoarthritis, right foot and ankle Hammer toes and osteoarthritis causing balance issues and pain. - Recommended strengthening exercises. - Advised gel pads for hammer toes.      Follow-Up Instructions: Return if symptoms worsen or fail to improve.   Ortho Exam  Patient is alert, oriented, no adenopathy, well-dressed, normal affect, normal respiratory effort. Physical Exam MUSCULOSKELETAL: Antalgic gait. Left knee tender to palpation at medial joint line. Collateral and cruciate ligaments stable.   Patient states she has persistent numbness of the lateral aspect of the right total knee.  Patient complains of weakness in the right lower extremity.   Imaging: No results found. No images are attached to the encounter.  Labs: Lab Results  Component Value Date   HGBA1C 5.5 04/04/2024   HGBA1C 5.8 12/10/2023   HGBA1C 5.5 07/01/2023   ESRSEDRATE 11 11/26/2021   LABURIC 5.6 09/08/2023   LABURIC 5.2 11/26/2021  Lab Results  Component Value Date   ALBUMIN  4.0 05/18/2019    No results found for: MG No results found for: VD25OH  No results found for: PREALBUMIN    Latest Ref Rng & Units 08/13/2023    9:10 AM 07/01/2023    9:06 AM 11/02/2020    8:05 AM  CBC EXTENDED  WBC 4.0 - 10.5 K/uL 6.0  6.1    RBC 3.87 - 5.11 MIL/uL 4.57  4.38    Hemoglobin 12.0 - 15.0 g/dL 86.8  87.6  85.3   HCT 36.0 - 46.0 % 40.7  38.5   43.0   Platelets 150 - 400 K/uL 272  296       There is no height or weight on file to calculate BMI.  Orders:  No orders of the defined types were placed in this encounter.  No orders of the defined types were placed in this encounter.    Procedures: Large Joint Inj: L knee on 11/24/2024 11:18 AM Indications: pain and diagnostic evaluation Details: 22 G 1.5 in needle, anteromedial approach  Arthrogram: No  Medications: 5 mL lidocaine  (PF) 1 %; 40 mg methylPREDNISolone  acetate 40 MG/ML Outcome: tolerated well, no immediate complications Procedure, treatment alternatives, risks and benefits explained, specific risks discussed. Consent was given by the patient. Immediately prior to procedure a time out was called to verify the correct patient, procedure, equipment, support staff and site/side marked as required. Patient was prepped and draped in the usual sterile fashion.      Clinical Data: No additional findings.  ROS:  All other systems negative, except as noted in the HPI. Review of Systems  Objective: Vital Signs: LMP  (LMP Unknown)   Specialty Comments:  CLINICAL DATA:  Low back pain, symptoms persist with > 6 wks treatment. Worsening chronic right hip pain. History of lumbar fusion.   EXAM: MRI LUMBAR SPINE WITHOUT CONTRAST   TECHNIQUE: Multiplanar, multisequence MR imaging of the lumbar spine was performed. No intravenous contrast was administered.   COMPARISON:  Lumbar spine MRI 11/13/2018   FINDINGS: Segmentation:  Standard.   Alignment: Grade 1 anterolisthesis of L4 on L5, reduced following interval fusion. Slight right convex curvature of the lumbar spine.   Vertebrae: No fracture, suspicious marrow lesion, or significant marrow edema. Interval L4-5 PLIF with evidence of solid arthrodesis.   Conus medullaris and cauda equina: Conus extends to the L1-2 level. Conus and cauda equina appear normal.   Paraspinal and other soft tissues:  Postoperative changes in the posterior lower lumbar soft tissues. No fluid collection.   Disc levels:   T12-L1: Mild facet and ligamentum flavum hypertrophy without disc herniation or stenosis.   L1-2: Mild facet and ligamentum flavum hypertrophy without disc herniation or stenosis.   L2-3: Increased, mild disc bulging and moderate facet and ligamentum flavum hypertrophy result in borderline to mild bilateral neural foraminal stenosis without spinal stenosis.   L3-4: Interval laminectomies. Increased disc bulging and moderate to severe facet hypertrophy result in new mild to moderate bilateral neural foraminal stenosis without spinal stenosis.   L4-5: Interval wide posterior decompression and fusion. Widely patent spinal canal and right neural foramen. Suboptimal assessment of the left neural foramen although no residual compressive stenosis is suspected status post facetectomy.   L5-S1: Minimal disc bulging and moderate facet and ligamentum flavum hypertrophy have progressed and result in borderline to mild bilateral neural foraminal stenosis without spinal stenosis.   IMPRESSION: 1. Widely patent spinal canal at L3-4 and L4-5 following interval posterior  decompression. 2. New mild to moderate bilateral neural foraminal stenosis at L3-4. 3. Borderline to mild neural foraminal stenosis at L2-3 and L5-S1.     Electronically Signed   By: Dasie Hamburg M.D.   On: 05/29/2024 10:23  PMFS History: Patient Active Problem List   Diagnosis Date Noted   Chronic pain of right ankle 02/01/2024   Benign lichenoid keratosis 12/24/2023   Bruising 12/24/2023   Unilateral primary osteoarthritis, right knee 08/21/2023   Total knee replacement status, right 08/21/2023   Hidradenitis suppurativa of right axilla 06/03/2023   Vaginal irritation 02/21/2023   Tinea 12/18/2022   Left shoulder pain 08/25/2022   DJD (degenerative joint disease), multiple sites 08/06/2022   COPD, mild (HCC)  02/11/2022   Carpal tunnel syndrome, bilateral 12/21/2021   Arthritis of finger of left hand 11/26/2021   Traumatic arthritis of left foot    Lichen simplex chronicus 06/04/2020   Arthritis of midfoot 03/14/2020   History of lumbar fusion 03/14/2020   Well controlled diabetes mellitus (HCC) 11/07/2019   Nummular eczema 07/06/2019   Lumbar stenosis 05/20/2019   Acute left-sided low back pain without sciatica 07/18/2018   Osteoarthritis of right hip 06/03/2018   Chronic right shoulder pain 05/05/2018   Essential hypertension 02/15/2018   Past Medical History:  Diagnosis Date   Arthritis    right knee, lower back   Chest wall pain 04/20/2021   Dyspnea    very rare -tx with albuterol  neb sol if needed   Hyperlipidemia    Hypertension    Intertrigo 08/06/2022   Pre-diabetes    per patient - does not check blood sugar   Right hip pain 05/05/2018   Rupture of anterior cruciate ligament of right knee 02/15/2018   Sprain of medial collateral ligament of right knee 02/15/2018   SVD (spontaneous vaginal delivery)    x 5 - only 2 living, 1 stillborn and 1 premature at 7 months demise,1 child deceased   Wears dentures    upper and lower    Family History  Problem Relation Age of Onset   Cancer Sister    Healthy Daughter    Healthy Daughter     Past Surgical History:  Procedure Laterality Date   FOOT ARTHRODESIS Left 11/02/2020   Procedure: LEFT MIDFOOT FUSION BASE 1ST AND 2ND METATARSAL;  Surgeon: Harden Jerona GAILS, MD;  Location: [facility] OR;  Service: Orthopedics;  Laterality: Left;   LUMBAR FUSION  05/20/2019   GILL PROCEDURE, LEFT TRANSFORAMINAL LUMBAR INTERBODY FUSION, PEDICLE INSTRUMENTATION, BILATERAL FUSION (N/A   TOTAL KNEE ARTHROPLASTY Right 08/21/2023   Procedure: RIGHT TOTAL KNEE ARTHROPLASTY;  Surgeon: Harden Jerona GAILS, MD;  Location: [facility] OR;  Service: Orthopedics;  Laterality: Right;   TUBAL LIGATION     Social History   Occupational History   Not on file  Tobacco Use    Smoking status: Former    Current packs/day: 0.00    Average packs/day: 1 pack/day for 30.0 years (30.0 ttl pk-yrs)    Types: Cigarettes    Start date: 01/1985    Quit date: 01/2015    Years since quitting: 9.8    Passive exposure: Past   Smokeless tobacco: Never  Vaping Use   Vaping status: Never Used  Substance and Sexual Activity   Alcohol use: Yes    Comment: occ   Drug use: Yes    Types: Marijuana   Sexual activity: Not on file

## 2024-11-25 DIAGNOSIS — Z419 Encounter for procedure for purposes other than remedying health state, unspecified: Secondary | ICD-10-CM | POA: Diagnosis not present

## 2024-12-05 ENCOUNTER — Encounter: Payer: Self-pay | Admitting: Family Medicine

## 2024-12-05 ENCOUNTER — Other Ambulatory Visit: Payer: Self-pay | Admitting: Family Medicine

## 2024-12-05 MED ORDER — KETOCONAZOLE 2 % EX CREA: 1.0000 | TOPICAL_CREAM | Freq: Every day | CUTANEOUS | 0 refills | Status: AC

## 2024-12-06 ENCOUNTER — Ambulatory Visit

## 2024-12-06 ENCOUNTER — Ambulatory Visit
Admission: RE | Admit: 2024-12-06 | Discharge: 2024-12-06 | Disposition: A | Discharge status: Home / Self Care | Source: Ambulatory Visit | Attending: Family Medicine | Admitting: Family Medicine

## 2024-12-06 DIAGNOSIS — Z1231 Encounter for screening mammogram for malignant neoplasm of breast: Secondary | ICD-10-CM | POA: Diagnosis not present

## 2024-12-06 DIAGNOSIS — G5602 Carpal tunnel syndrome, left upper limb: Secondary | ICD-10-CM | POA: Diagnosis not present

## 2024-12-23 ENCOUNTER — Other Ambulatory Visit: Payer: Self-pay | Admitting: Family Medicine

## 2024-12-23 DIAGNOSIS — E119 Type 2 diabetes mellitus without complications: Secondary | ICD-10-CM

## 2024-12-26 ENCOUNTER — Other Ambulatory Visit: Payer: Self-pay | Admitting: Family Medicine

## 2024-12-26 DIAGNOSIS — G5601 Carpal tunnel syndrome, right upper limb: Secondary | ICD-10-CM

## 2024-12-27 ENCOUNTER — Encounter: Payer: Self-pay | Admitting: Family Medicine

## 2024-12-28 ENCOUNTER — Other Ambulatory Visit: Payer: Self-pay | Admitting: Family Medicine

## 2024-12-28 MED ORDER — DICLOFENAC SODIUM 3 % EX GEL: 1.0000 | CUTANEOUS | 3 refills | Status: AC | PRN

## 2025-01-06 ENCOUNTER — Ambulatory Visit: Admitting: Family

## 2025-01-10 ENCOUNTER — Encounter: Payer: Self-pay | Admitting: Family

## 2025-01-10 ENCOUNTER — Ambulatory Visit: Payer: Self-pay

## 2025-01-10 ENCOUNTER — Encounter: Payer: Self-pay | Admitting: Sports Medicine

## 2025-01-10 ENCOUNTER — Other Ambulatory Visit: Payer: Self-pay

## 2025-01-10 ENCOUNTER — Ambulatory Visit: Admitting: Sports Medicine

## 2025-01-10 ENCOUNTER — Ambulatory Visit: Admitting: Family

## 2025-01-10 DIAGNOSIS — M25552 Pain in left hip: Secondary | ICD-10-CM | POA: Diagnosis not present

## 2025-01-10 MED ORDER — LIDOCAINE HCL 1 % IJ SOLN
4.0000 mL | INTRAMUSCULAR | Status: AC | PRN
  Administered 2025-01-10: 4 mL

## 2025-01-10 MED ORDER — METHYLPREDNISOLONE ACETATE 40 MG/ML IJ SUSP
80.0000 mg | INTRAMUSCULAR | Status: AC | PRN
  Administered 2025-01-10: 80 mg via INTRA_ARTICULAR

## 2025-01-10 NOTE — Progress Notes (Signed)
 "  Office Visit Note   Patient: Sandra Lozano           Date of Birth: 05-16-1961           MRN: [id number] Visit Date: 01/10/2025              Requested by: Nicholas Bar, MD [address] [place],  [place] [address] PCP: Nicholas Bar, MD  No chief complaint on file.     HPI: The patient is a 64 year old woman who is seen today for concern of left buttock pain associated with giving way she has pain with ambulation she has wondered if she has strained or pulled a muscle she has not had any associated falls  This has been ongoing for about 2 weeks  Does not feel similar to prior episodes of lumbar radiculopathy  Has tried ibuprofen , meloxicam  and Tylenol  without relief  Assessment & Plan: Visit Diagnoses: No diagnosis found.  Plan: Dr Burnetta to do IA hip injection on left this am, hope will be diagnostic and therapeutic.  Follow-Up Instructions: No follow-ups on file.   Left Hip Exam   Tenderness  The patient is experiencing no tenderness.   Range of Motion  The patient has normal left hip ROM.  Tests  FABER: positive  Other  Sensation: normal  Comments:  Pain with forward flexion, pain with internal rotation      Patient is alert, oriented, no adenopathy, well-dressed, normal affect, normal respiratory effort.     Imaging: No results found. No images are attached to the encounter.  Labs: Lab Results  Component Value Date   HGBA1C 5.5 04/04/2024   HGBA1C 5.8 12/10/2023   HGBA1C 5.5 07/01/2023   ESRSEDRATE 11 11/26/2021   LABURIC 5.6 09/08/2023   LABURIC 5.2 11/26/2021     Lab Results  Component Value Date   ALBUMIN  4.0 05/18/2019    No results found for: MG No results found for: VD25OH  No results found for: PREALBUMIN    Latest Ref Rng & Units 08/13/2023    9:10 AM 07/01/2023    9:06 AM 11/02/2020    8:05 AM  CBC EXTENDED  WBC 4.0 - 10.5 K/uL 6.0  6.1    RBC 3.87 - 5.11 MIL/uL 4.57  4.38    Hemoglobin 12.0 - 15.0 g/dL 86.8   87.6  85.3   HCT 36.0 - 46.0 % 40.7  38.5  43.0   Platelets 150 - 400 K/uL 272  296       There is no height or weight on file to calculate BMI.  Orders:  No orders of the defined types were placed in this encounter.  No orders of the defined types were placed in this encounter.    Procedures: No procedures performed  Clinical Data: No additional findings.  ROS:  All other systems negative, except as noted in the HPI. Review of Systems  Constitutional: Negative.   Musculoskeletal:  Positive for arthralgias and myalgias. Negative for joint swelling.  Neurological:  Negative for weakness.    Objective: Vital Signs: LMP  (LMP Unknown)   Specialty Comments:  CLINICAL DATA:  Low back pain, symptoms persist with > 6 wks treatment. Worsening chronic right hip pain. History of lumbar fusion.   EXAM: MRI LUMBAR SPINE WITHOUT CONTRAST   TECHNIQUE: Multiplanar, multisequence MR imaging of the lumbar spine was performed. No intravenous contrast was administered.   COMPARISON:  Lumbar spine MRI 11/13/2018   FINDINGS: Segmentation:  Standard.   Alignment:  Grade 1 anterolisthesis of L4 on L5, reduced following interval fusion. Slight right convex curvature of the lumbar spine.   Vertebrae: No fracture, suspicious marrow lesion, or significant marrow edema. Interval L4-5 PLIF with evidence of solid arthrodesis.   Conus medullaris and cauda equina: Conus extends to the L1-2 level. Conus and cauda equina appear normal.   Paraspinal and other soft tissues: Postoperative changes in the posterior lower lumbar soft tissues. No fluid collection.   Disc levels:   T12-L1: Mild facet and ligamentum flavum hypertrophy without disc herniation or stenosis.   L1-2: Mild facet and ligamentum flavum hypertrophy without disc herniation or stenosis.   L2-3: Increased, mild disc bulging and moderate facet and ligamentum flavum hypertrophy result in borderline to mild bilateral  neural foraminal stenosis without spinal stenosis.   L3-4: Interval laminectomies. Increased disc bulging and moderate to severe facet hypertrophy result in new mild to moderate bilateral neural foraminal stenosis without spinal stenosis.   L4-5: Interval wide posterior decompression and fusion. Widely patent spinal canal and right neural foramen. Suboptimal assessment of the left neural foramen although no residual compressive stenosis is suspected status post facetectomy.   L5-S1: Minimal disc bulging and moderate facet and ligamentum flavum hypertrophy have progressed and result in borderline to mild bilateral neural foraminal stenosis without spinal stenosis.   IMPRESSION: 1. Widely patent spinal canal at L3-4 and L4-5 following interval posterior decompression. 2. New mild to moderate bilateral neural foraminal stenosis at L3-4. 3. Borderline to mild neural foraminal stenosis at L2-3 and L5-S1.     Electronically Signed   By: Dasie Hamburg M.D.   On: 05/29/2024 10:23  PMFS History: Patient Active Problem List   Diagnosis Date Noted   Chronic pain of right ankle 02/01/2024   Benign lichenoid keratosis 12/24/2023   Bruising 12/24/2023   Unilateral primary osteoarthritis, right knee 08/21/2023   Total knee replacement status, right 08/21/2023   Hidradenitis suppurativa of right axilla 06/03/2023   Vaginal irritation 02/21/2023   Tinea 12/18/2022   Left shoulder pain 08/25/2022   DJD (degenerative joint disease), multiple sites 08/06/2022   COPD, mild (HCC) 02/11/2022   Carpal tunnel syndrome, bilateral 12/21/2021   Arthritis of finger of left hand 11/26/2021   Traumatic arthritis of left foot    Lichen simplex chronicus 06/04/2020   Arthritis of midfoot 03/14/2020   History of lumbar fusion 03/14/2020   Well controlled diabetes mellitus (HCC) 11/07/2019   Nummular eczema 07/06/2019   Lumbar stenosis 05/20/2019   Acute left-sided low back pain without sciatica  07/18/2018   Osteoarthritis of right hip 06/03/2018   Chronic right shoulder pain 05/05/2018   Essential hypertension 02/15/2018   Past Medical History:  Diagnosis Date   Arthritis    right knee, lower back   Chest wall pain 04/20/2021   Dyspnea    very rare -tx with albuterol  neb sol if needed   Hyperlipidemia    Hypertension    Intertrigo 08/06/2022   Pre-diabetes    per patient - does not check blood sugar   Right hip pain 05/05/2018   Rupture of anterior cruciate ligament of right knee 02/15/2018   Sprain of medial collateral ligament of right knee 02/15/2018   SVD (spontaneous vaginal delivery)    x 5 - only 2 living, 1 stillborn and 1 premature at 7 months demise,1 child deceased   Wears dentures    upper and lower    Family History  Problem Relation Age of Onset   Cancer Sister  Healthy Daughter    Healthy Daughter     Past Surgical History:  Procedure Laterality Date   FOOT ARTHRODESIS Left 11/02/2020   Procedure: LEFT MIDFOOT FUSION BASE 1ST AND 2ND METATARSAL;  Surgeon: Harden Jerona GAILS, MD;  Location: [facility] OR;  Service: Orthopedics;  Laterality: Left;   LUMBAR FUSION  05/20/2019   GILL PROCEDURE, LEFT TRANSFORAMINAL LUMBAR INTERBODY FUSION, PEDICLE INSTRUMENTATION, BILATERAL FUSION (N/A   TOTAL KNEE ARTHROPLASTY Right 08/21/2023   Procedure: RIGHT TOTAL KNEE ARTHROPLASTY;  Surgeon: Harden Jerona GAILS, MD;  Location: [facility] OR;  Service: Orthopedics;  Laterality: Right;   TUBAL LIGATION     Social History   Occupational History   Not on file  Tobacco Use   Smoking status: Former    Current packs/day: 0.00    Average packs/day: 1 pack/day for 30.0 years (30.0 ttl pk-yrs)    Types: Cigarettes    Start date: 01/1985    Quit date: 01/2015    Years since quitting: 9.9    Passive exposure: Past   Smokeless tobacco: Never  Vaping Use   Vaping status: Never Used  Substance and Sexual Activity   Alcohol use: Yes    Comment: occ   Drug use: Yes    Types: Marijuana    Sexual activity: Not on file       "

## 2025-01-10 NOTE — Progress Notes (Signed)
" ° °  Procedure Note  Patient: Sandra Lozano             Date of Birth: 1961/05/23           MRN: [id number]             Visit Date: 01/10/2025  Procedures: Visit Diagnoses:  1. Pain in left hip    Lab Results  Component Value Date   HGBA1C 5.5 04/04/2024   Large Joint Inj: L hip joint on 01/10/2025 11:15 AM Indications: pain Details: 22 G 3.5 in needle, ultrasound-guided anterior approach Medications: 4 mL lidocaine  1 %; 80 mg methylPREDNISolone  acetate 40 MG/ML Outcome: tolerated well, no immediate complications  Procedure: US -guided intra-articular hip injection, Left After discussion on risks/benefits/indications and informed verbal consent was obtained, a timeout was performed. Patient was lying supine on exam table. The hip was cleaned with betadine and alcohol swabs. Then utilizing ultrasound guidance, the patient's femoral head and neck junction was identified and subsequently injected with 4:2 lidocaine :depomedrol via an in-plane approach with ultrasound visualization of the injectate administered into the hip joint. Patient tolerated procedure well without immediate complications.  Procedure, treatment alternatives, risks and benefits explained, specific risks discussed. Consent was given by the patient. Immediately prior to procedure a time out was called to verify the correct patient, procedure, equipment, support staff and site/side marked as required. Patient was prepped and draped in the usual sterile fashion.     - patient tolerated procedure well, discussed post-injection protocol - follow-up with Erin Zimora as indicated; I am happy to see them as needed  Lonell Sprang, DO Primary Care Sports Medicine Physician  [facility] - Orthopedics  This note was dictated using Dragon naturally speaking software and may contain errors in syntax, spelling, or content which have not been identified prior to signing this note.      "

## 2025-01-11 ENCOUNTER — Other Ambulatory Visit: Payer: Self-pay

## 2025-01-11 DIAGNOSIS — E119 Type 2 diabetes mellitus without complications: Secondary | ICD-10-CM

## 2025-01-12 ENCOUNTER — Telehealth: Payer: Self-pay | Admitting: Family

## 2025-01-12 MED ORDER — OZEMPIC (0.25 OR 0.5 MG/DOSE) 2 MG/3ML ~~LOC~~ SOPN: 0.5000 mg | PEN_INJECTOR | SUBCUTANEOUS | 0 refills | Status: AC

## 2025-01-12 NOTE — Telephone Encounter (Signed)
 Pt called requesting a refill of Capsaicin menthol . Please send to [facility]. Pt number is [phone].

## 2025-01-13 ENCOUNTER — Other Ambulatory Visit: Payer: Self-pay

## 2025-01-13 DIAGNOSIS — M19071 Primary osteoarthritis, right ankle and foot: Secondary | ICD-10-CM

## 2025-01-14 MED ORDER — ZIKS ARTHRITIS PAIN RELIEF 0.025-1-12 % EX CREA: 1.0000 | TOPICAL_CREAM | CUTANEOUS | 0 refills | Status: AC | PRN

## 2025-01-17 MED ORDER — ZIKS ARTHRITIS PAIN RELIEF 0.025-1-12 % EX CREA: 1.0000 | TOPICAL_CREAM | Freq: Three times a day (TID) | CUTANEOUS | 2 refills | Status: AC | PRN

## 2025-01-19 ENCOUNTER — Other Ambulatory Visit (HOSPITAL_COMMUNITY): Payer: Self-pay

## 2025-01-19 ENCOUNTER — Telehealth: Payer: Self-pay

## 2025-01-19 NOTE — Telephone Encounter (Signed)
 Pharmacy Patient Advocate Encounter  Received notification from [employer] that Prior Authorization for [id number]  has been APPROVED.  Nancylee of dates)  PA #/Case ID/Reference #: [id number]

## 2025-01-26 ENCOUNTER — Ambulatory Visit: Payer: Self-pay | Admitting: Family Medicine
